# Patient Record
Sex: Female | Born: 1935 | Race: White | Hispanic: No | Marital: Married | Smoking: Never smoker
Health system: Southern US, Community
[De-identification: ages and names within clinical notes are randomized; demographics above are authoritative.]

## PROBLEM LIST (undated history)

## (undated) DIAGNOSIS — M810 Age-related osteoporosis without current pathological fracture: Secondary | ICD-10-CM

## (undated) DIAGNOSIS — F333 Major depressive disorder, recurrent, severe with psychotic symptoms: Secondary | ICD-10-CM

## (undated) DIAGNOSIS — D369 Benign neoplasm, unspecified site: Secondary | ICD-10-CM

## (undated) DIAGNOSIS — H7291 Unspecified perforation of tympanic membrane, right ear: Secondary | ICD-10-CM

## (undated) DIAGNOSIS — M419 Scoliosis, unspecified: Secondary | ICD-10-CM

## (undated) DIAGNOSIS — Z87442 Personal history of urinary calculi: Secondary | ICD-10-CM

## (undated) DIAGNOSIS — K5792 Diverticulitis of intestine, part unspecified, without perforation or abscess without bleeding: Secondary | ICD-10-CM

## (undated) DIAGNOSIS — R339 Retention of urine, unspecified: Secondary | ICD-10-CM

## (undated) DIAGNOSIS — G4700 Insomnia, unspecified: Secondary | ICD-10-CM

## (undated) DIAGNOSIS — J309 Allergic rhinitis, unspecified: Secondary | ICD-10-CM

## (undated) DIAGNOSIS — E559 Vitamin D deficiency, unspecified: Secondary | ICD-10-CM

## (undated) DIAGNOSIS — E039 Hypothyroidism, unspecified: Secondary | ICD-10-CM

## (undated) DIAGNOSIS — G8929 Other chronic pain: Secondary | ICD-10-CM

## (undated) DIAGNOSIS — M549 Dorsalgia, unspecified: Secondary | ICD-10-CM

## (undated) DIAGNOSIS — K219 Gastro-esophageal reflux disease without esophagitis: Secondary | ICD-10-CM

## (undated) DIAGNOSIS — N390 Urinary tract infection, site not specified: Secondary | ICD-10-CM

## (undated) DIAGNOSIS — F419 Anxiety disorder, unspecified: Secondary | ICD-10-CM

## (undated) DIAGNOSIS — N952 Postmenopausal atrophic vaginitis: Secondary | ICD-10-CM

## (undated) DIAGNOSIS — K449 Diaphragmatic hernia without obstruction or gangrene: Secondary | ICD-10-CM

## (undated) HISTORY — DX: Scoliosis, unspecified: M41.9

## (undated) HISTORY — DX: Age-related osteoporosis without current pathological fracture: M81.0

## (undated) HISTORY — DX: Gastro-esophageal reflux disease without esophagitis: K21.9

## (undated) HISTORY — PX: ROTATOR CUFF REPAIR: SHX139

## (undated) HISTORY — PX: ESOPHAGOGASTRODUODENOSCOPY: SHX1529

## (undated) HISTORY — DX: Vitamin D deficiency, unspecified: E55.9

## (undated) HISTORY — DX: Insomnia, unspecified: G47.00

## (undated) HISTORY — DX: Benign neoplasm, unspecified site: D36.9

## (undated) HISTORY — DX: Anxiety disorder, unspecified: F41.9

## (undated) HISTORY — DX: Major depressive disorder, recurrent, severe with psychotic symptoms: F33.3

## (undated) HISTORY — DX: Hypothyroidism, unspecified: E03.9

## (undated) HISTORY — PX: CHOLECYSTECTOMY: SHX55

## (undated) HISTORY — DX: Diaphragmatic hernia without obstruction or gangrene: K44.9

## (undated) HISTORY — DX: Unspecified perforation of tympanic membrane, right ear: H72.91

## (undated) HISTORY — DX: Dorsalgia, unspecified: M54.9

## (undated) HISTORY — DX: Personal history of urinary calculi: Z87.442

## (undated) HISTORY — DX: Postmenopausal atrophic vaginitis: N95.2

## (undated) HISTORY — DX: Allergic rhinitis, unspecified: J30.9

## (undated) HISTORY — DX: Retention of urine, unspecified: R33.9

## (undated) HISTORY — DX: Urinary tract infection, site not specified: N39.0

## (undated) HISTORY — PX: TONSILLECTOMY: SHX5217

## (undated) HISTORY — DX: Other chronic pain: G89.29

## (undated) HISTORY — DX: Diverticulitis of intestine, part unspecified, without perforation or abscess without bleeding: K57.92

---
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
## 1963-02-19 HISTORY — PX: OOPHORECTOMY: SHX86

## 1977-02-18 HISTORY — PX: ABDOMINAL HYSTERECTOMY: SHX81

## 1999-11-08 ENCOUNTER — Emergency Department (HOSPITAL_COMMUNITY): Admission: EM | Admit: 1999-11-08 | Discharge: 1999-11-08 | Payer: Self-pay | Admitting: *Deleted

## 2001-02-09 ENCOUNTER — Encounter: Payer: Self-pay | Admitting: Emergency Medicine

## 2001-02-09 ENCOUNTER — Emergency Department (HOSPITAL_COMMUNITY): Admission: EM | Admit: 2001-02-09 | Discharge: 2001-02-09 | Payer: Self-pay | Admitting: Emergency Medicine

## 2002-02-18 ENCOUNTER — Emergency Department (HOSPITAL_COMMUNITY): Admission: EM | Admit: 2002-02-18 | Discharge: 2002-02-18 | Payer: Self-pay

## 2005-02-18 DIAGNOSIS — M810 Age-related osteoporosis without current pathological fracture: Secondary | ICD-10-CM

## 2005-02-18 HISTORY — DX: Age-related osteoporosis without current pathological fracture: M81.0

## 2005-02-18 LAB — HM COLONOSCOPY

## 2005-04-12 ENCOUNTER — Ambulatory Visit: Payer: Self-pay | Admitting: Unknown Physician Specialty

## 2005-06-18 ENCOUNTER — Emergency Department: Payer: Self-pay | Admitting: Emergency Medicine

## 2005-06-24 ENCOUNTER — Other Ambulatory Visit: Payer: Self-pay

## 2005-06-24 ENCOUNTER — Emergency Department: Payer: Self-pay | Admitting: General Practice

## 2005-07-03 ENCOUNTER — Ambulatory Visit: Payer: Self-pay | Admitting: Internal Medicine

## 2005-07-16 ENCOUNTER — Ambulatory Visit: Payer: Self-pay | Admitting: Internal Medicine

## 2005-08-14 ENCOUNTER — Encounter: Admission: RE | Admit: 2005-08-14 | Discharge: 2005-08-14 | Payer: Self-pay | Admitting: Internal Medicine

## 2005-08-19 ENCOUNTER — Ambulatory Visit: Payer: Self-pay | Admitting: Internal Medicine

## 2005-09-18 LAB — HM DEXA SCAN

## 2005-09-23 ENCOUNTER — Ambulatory Visit: Payer: Self-pay | Admitting: Family Medicine

## 2005-10-08 ENCOUNTER — Encounter: Admission: RE | Admit: 2005-10-08 | Discharge: 2005-10-08 | Payer: Self-pay | Admitting: Family Medicine

## 2005-10-23 ENCOUNTER — Ambulatory Visit: Payer: Self-pay | Admitting: Family Medicine

## 2006-04-08 ENCOUNTER — Ambulatory Visit: Payer: Self-pay | Admitting: Internal Medicine

## 2006-04-08 LAB — CONVERTED CEMR LAB
ALT: 17 units/L (ref 0–40)
AST: 20 units/L (ref 0–37)
Albumin: 3.4 g/dL — ABNORMAL LOW (ref 3.5–5.2)
Alkaline Phosphatase: 58 units/L (ref 39–117)
BUN: 8 mg/dL (ref 6–23)
Basophils Absolute: 0.1 10*3/uL (ref 0.0–0.1)
Basophils Relative: 0.8 % (ref 0.0–1.0)
Bilirubin, Direct: 0.1 mg/dL (ref 0.0–0.3)
CO2: 29 meq/L (ref 19–32)
Calcium: 9.5 mg/dL (ref 8.4–10.5)
Chloride: 103 meq/L (ref 96–112)
Creatinine, Ser: 1 mg/dL (ref 0.4–1.2)
Eosinophils Absolute: 0.1 10*3/uL (ref 0.0–0.6)
Eosinophils Relative: 1.9 % (ref 0.0–5.0)
GFR calc Af Amer: 70 mL/min
GFR calc non Af Amer: 58 mL/min
Glucose, Bld: 129 mg/dL — ABNORMAL HIGH (ref 70–99)
HCT: 40.7 % (ref 36.0–46.0)
Hemoglobin: 13.6 g/dL (ref 12.0–15.0)
Lymphocytes Relative: 29.9 % (ref 12.0–46.0)
MCHC: 33.5 g/dL (ref 30.0–36.0)
MCV: 88.1 fL (ref 78.0–100.0)
Monocytes Absolute: 0.5 10*3/uL (ref 0.2–0.7)
Monocytes Relative: 6.9 % (ref 3.0–11.0)
Neutro Abs: 4.3 10*3/uL (ref 1.4–7.7)
Neutrophils Relative %: 60.5 % (ref 43.0–77.0)
Platelets: 265 10*3/uL (ref 150–400)
Potassium: 4 meq/L (ref 3.5–5.1)
RBC: 4.62 M/uL (ref 3.87–5.11)
RDW: 13.1 % (ref 11.5–14.6)
Sodium: 138 meq/L (ref 135–145)
Total Bilirubin: 0.7 mg/dL (ref 0.3–1.2)
Total Protein: 6.8 g/dL (ref 6.0–8.3)
WBC: 7.2 10*3/uL (ref 4.5–10.5)

## 2006-04-11 ENCOUNTER — Ambulatory Visit: Payer: Self-pay | Admitting: Podiatry

## 2006-08-29 DIAGNOSIS — F333 Major depressive disorder, recurrent, severe with psychotic symptoms: Secondary | ICD-10-CM

## 2006-10-16 ENCOUNTER — Telehealth (INDEPENDENT_AMBULATORY_CARE_PROVIDER_SITE_OTHER): Payer: Self-pay | Admitting: *Deleted

## 2006-10-29 ENCOUNTER — Ambulatory Visit: Payer: Self-pay | Admitting: Psychiatry

## 2006-10-30 ENCOUNTER — Encounter (INDEPENDENT_AMBULATORY_CARE_PROVIDER_SITE_OTHER): Payer: Self-pay | Admitting: *Deleted

## 2006-10-30 ENCOUNTER — Telehealth (INDEPENDENT_AMBULATORY_CARE_PROVIDER_SITE_OTHER): Payer: Self-pay | Admitting: *Deleted

## 2006-11-03 ENCOUNTER — Encounter (INDEPENDENT_AMBULATORY_CARE_PROVIDER_SITE_OTHER): Payer: Self-pay | Admitting: *Deleted

## 2006-11-04 ENCOUNTER — Ambulatory Visit: Payer: Self-pay | Admitting: Internal Medicine

## 2006-11-04 DIAGNOSIS — K219 Gastro-esophageal reflux disease without esophagitis: Secondary | ICD-10-CM

## 2006-11-04 DIAGNOSIS — E039 Hypothyroidism, unspecified: Secondary | ICD-10-CM

## 2006-11-04 DIAGNOSIS — M810 Age-related osteoporosis without current pathological fracture: Secondary | ICD-10-CM | POA: Insufficient documentation

## 2006-11-05 ENCOUNTER — Ambulatory Visit: Payer: Self-pay | Admitting: Psychiatry

## 2006-12-02 ENCOUNTER — Ambulatory Visit: Payer: Self-pay | Admitting: *Deleted

## 2006-12-03 ENCOUNTER — Ambulatory Visit: Payer: Self-pay | Admitting: Psychiatry

## 2006-12-20 DIAGNOSIS — J309 Allergic rhinitis, unspecified: Secondary | ICD-10-CM

## 2006-12-20 HISTORY — DX: Allergic rhinitis, unspecified: J30.9

## 2006-12-23 DIAGNOSIS — M542 Cervicalgia: Secondary | ICD-10-CM | POA: Insufficient documentation

## 2006-12-30 ENCOUNTER — Ambulatory Visit: Payer: Self-pay | Admitting: Internal Medicine

## 2007-02-02 ENCOUNTER — Telehealth (INDEPENDENT_AMBULATORY_CARE_PROVIDER_SITE_OTHER): Payer: Self-pay | Admitting: *Deleted

## 2007-02-10 ENCOUNTER — Ambulatory Visit: Payer: Self-pay | Admitting: Internal Medicine

## 2007-03-26 ENCOUNTER — Telehealth (INDEPENDENT_AMBULATORY_CARE_PROVIDER_SITE_OTHER): Payer: Self-pay | Admitting: *Deleted

## 2007-03-27 ENCOUNTER — Telehealth (INDEPENDENT_AMBULATORY_CARE_PROVIDER_SITE_OTHER): Payer: Self-pay | Admitting: *Deleted

## 2007-04-14 ENCOUNTER — Ambulatory Visit: Payer: Self-pay | Admitting: Internal Medicine

## 2007-04-14 DIAGNOSIS — G8929 Other chronic pain: Secondary | ICD-10-CM | POA: Insufficient documentation

## 2007-04-14 DIAGNOSIS — M545 Low back pain: Secondary | ICD-10-CM

## 2007-04-14 DIAGNOSIS — M62838 Other muscle spasm: Secondary | ICD-10-CM | POA: Insufficient documentation

## 2007-04-14 HISTORY — DX: Other chronic pain: G89.29

## 2007-05-13 ENCOUNTER — Telehealth (INDEPENDENT_AMBULATORY_CARE_PROVIDER_SITE_OTHER): Payer: Self-pay | Admitting: *Deleted

## 2007-05-20 ENCOUNTER — Ambulatory Visit: Payer: Self-pay | Admitting: Internal Medicine

## 2007-07-10 ENCOUNTER — Telehealth (INDEPENDENT_AMBULATORY_CARE_PROVIDER_SITE_OTHER): Payer: Self-pay | Admitting: *Deleted

## 2007-07-14 ENCOUNTER — Ambulatory Visit: Payer: Self-pay | Admitting: Internal Medicine

## 2010-02-18 HISTORY — PX: KNEE SURGERY: SHX244

## 2010-09-19 ENCOUNTER — Encounter: Payer: Self-pay | Admitting: Internal Medicine

## 2011-04-02 ENCOUNTER — Encounter: Payer: Self-pay | Admitting: Internal Medicine

## 2011-04-03 ENCOUNTER — Ambulatory Visit: Payer: Self-pay | Admitting: Internal Medicine

## 2011-05-07 ENCOUNTER — Emergency Department: Payer: Self-pay | Admitting: Emergency Medicine

## 2011-05-07 DIAGNOSIS — M533 Sacrococcygeal disorders, not elsewhere classified: Secondary | ICD-10-CM | POA: Diagnosis not present

## 2011-05-07 DIAGNOSIS — M810 Age-related osteoporosis without current pathological fracture: Secondary | ICD-10-CM | POA: Diagnosis not present

## 2011-05-07 DIAGNOSIS — IMO0001 Reserved for inherently not codable concepts without codable children: Secondary | ICD-10-CM | POA: Diagnosis not present

## 2011-05-07 DIAGNOSIS — M25559 Pain in unspecified hip: Secondary | ICD-10-CM | POA: Diagnosis not present

## 2011-05-07 DIAGNOSIS — Z79899 Other long term (current) drug therapy: Secondary | ICD-10-CM | POA: Diagnosis not present

## 2011-05-07 DIAGNOSIS — E039 Hypothyroidism, unspecified: Secondary | ICD-10-CM | POA: Diagnosis not present

## 2011-05-07 DIAGNOSIS — IMO0002 Reserved for concepts with insufficient information to code with codable children: Secondary | ICD-10-CM | POA: Diagnosis not present

## 2011-05-12 ENCOUNTER — Emergency Department: Payer: Self-pay | Admitting: Internal Medicine

## 2011-05-12 DIAGNOSIS — M549 Dorsalgia, unspecified: Secondary | ICD-10-CM | POA: Diagnosis not present

## 2011-05-15 DIAGNOSIS — M545 Low back pain: Secondary | ICD-10-CM | POA: Diagnosis not present

## 2011-05-15 DIAGNOSIS — R209 Unspecified disturbances of skin sensation: Secondary | ICD-10-CM | POA: Diagnosis not present

## 2011-05-15 DIAGNOSIS — M546 Pain in thoracic spine: Secondary | ICD-10-CM | POA: Diagnosis not present

## 2011-05-31 DIAGNOSIS — M545 Low back pain: Secondary | ICD-10-CM | POA: Diagnosis not present

## 2011-06-03 DIAGNOSIS — M545 Low back pain: Secondary | ICD-10-CM | POA: Diagnosis not present

## 2011-06-18 DIAGNOSIS — M546 Pain in thoracic spine: Secondary | ICD-10-CM | POA: Diagnosis not present

## 2011-06-18 DIAGNOSIS — M25569 Pain in unspecified knee: Secondary | ICD-10-CM | POA: Diagnosis not present

## 2011-06-18 DIAGNOSIS — M545 Low back pain: Secondary | ICD-10-CM | POA: Diagnosis not present

## 2011-07-16 DIAGNOSIS — M25569 Pain in unspecified knee: Secondary | ICD-10-CM | POA: Diagnosis not present

## 2011-07-16 DIAGNOSIS — M545 Low back pain: Secondary | ICD-10-CM | POA: Diagnosis not present

## 2011-08-29 DIAGNOSIS — R5381 Other malaise: Secondary | ICD-10-CM | POA: Diagnosis not present

## 2011-08-29 DIAGNOSIS — M129 Arthropathy, unspecified: Secondary | ICD-10-CM | POA: Diagnosis not present

## 2011-08-29 DIAGNOSIS — E785 Hyperlipidemia, unspecified: Secondary | ICD-10-CM | POA: Diagnosis not present

## 2011-08-29 DIAGNOSIS — R5383 Other fatigue: Secondary | ICD-10-CM | POA: Diagnosis not present

## 2011-08-29 DIAGNOSIS — H7290 Unspecified perforation of tympanic membrane, unspecified ear: Secondary | ICD-10-CM | POA: Diagnosis not present

## 2011-08-29 DIAGNOSIS — J3089 Other allergic rhinitis: Secondary | ICD-10-CM | POA: Diagnosis not present

## 2011-10-30 DIAGNOSIS — L819 Disorder of pigmentation, unspecified: Secondary | ICD-10-CM | POA: Diagnosis not present

## 2011-10-30 DIAGNOSIS — L820 Inflamed seborrheic keratosis: Secondary | ICD-10-CM | POA: Diagnosis not present

## 2011-10-30 DIAGNOSIS — L821 Other seborrheic keratosis: Secondary | ICD-10-CM | POA: Diagnosis not present

## 2011-10-30 DIAGNOSIS — D1800 Hemangioma unspecified site: Secondary | ICD-10-CM | POA: Diagnosis not present

## 2011-11-01 DIAGNOSIS — M542 Cervicalgia: Secondary | ICD-10-CM | POA: Diagnosis not present

## 2011-11-19 ENCOUNTER — Encounter: Payer: Self-pay | Admitting: Internal Medicine

## 2011-12-19 DIAGNOSIS — E7800 Pure hypercholesterolemia, unspecified: Secondary | ICD-10-CM | POA: Diagnosis not present

## 2011-12-19 DIAGNOSIS — E039 Hypothyroidism, unspecified: Secondary | ICD-10-CM | POA: Diagnosis not present

## 2011-12-19 DIAGNOSIS — M810 Age-related osteoporosis without current pathological fracture: Secondary | ICD-10-CM | POA: Diagnosis not present

## 2011-12-23 DIAGNOSIS — N951 Menopausal and female climacteric states: Secondary | ICD-10-CM | POA: Diagnosis not present

## 2011-12-23 DIAGNOSIS — E559 Vitamin D deficiency, unspecified: Secondary | ICD-10-CM | POA: Diagnosis not present

## 2011-12-23 DIAGNOSIS — R030 Elevated blood-pressure reading, without diagnosis of hypertension: Secondary | ICD-10-CM | POA: Diagnosis not present

## 2011-12-23 DIAGNOSIS — E7800 Pure hypercholesterolemia, unspecified: Secondary | ICD-10-CM | POA: Diagnosis not present

## 2011-12-23 DIAGNOSIS — E039 Hypothyroidism, unspecified: Secondary | ICD-10-CM | POA: Diagnosis not present

## 2011-12-26 DIAGNOSIS — J3089 Other allergic rhinitis: Secondary | ICD-10-CM | POA: Diagnosis not present

## 2011-12-26 DIAGNOSIS — R002 Palpitations: Secondary | ICD-10-CM | POA: Diagnosis not present

## 2011-12-26 DIAGNOSIS — M129 Arthropathy, unspecified: Secondary | ICD-10-CM | POA: Diagnosis not present

## 2011-12-26 DIAGNOSIS — H7290 Unspecified perforation of tympanic membrane, unspecified ear: Secondary | ICD-10-CM | POA: Diagnosis not present

## 2011-12-26 DIAGNOSIS — H6120 Impacted cerumen, unspecified ear: Secondary | ICD-10-CM | POA: Diagnosis not present

## 2011-12-26 DIAGNOSIS — G4700 Insomnia, unspecified: Secondary | ICD-10-CM | POA: Diagnosis not present

## 2012-01-06 DIAGNOSIS — J209 Acute bronchitis, unspecified: Secondary | ICD-10-CM | POA: Diagnosis not present

## 2012-01-06 DIAGNOSIS — J019 Acute sinusitis, unspecified: Secondary | ICD-10-CM | POA: Diagnosis not present

## 2012-01-13 DIAGNOSIS — J019 Acute sinusitis, unspecified: Secondary | ICD-10-CM | POA: Diagnosis not present

## 2012-01-13 DIAGNOSIS — F411 Generalized anxiety disorder: Secondary | ICD-10-CM | POA: Diagnosis not present

## 2012-01-15 DIAGNOSIS — K5900 Constipation, unspecified: Secondary | ICD-10-CM | POA: Diagnosis not present

## 2012-03-02 ENCOUNTER — Encounter: Payer: Self-pay | Admitting: Internal Medicine

## 2012-03-14 ENCOUNTER — Emergency Department (HOSPITAL_COMMUNITY)
Admission: EM | Admit: 2012-03-14 | Discharge: 2012-03-15 | Disposition: A | Discharge status: Home / Self Care | Payer: Medicare Other | Attending: Emergency Medicine | Admitting: Emergency Medicine

## 2012-03-14 ENCOUNTER — Encounter (HOSPITAL_COMMUNITY): Payer: Self-pay | Admitting: Emergency Medicine

## 2012-03-14 ENCOUNTER — Emergency Department (HOSPITAL_COMMUNITY): Payer: Medicare Other

## 2012-03-14 DIAGNOSIS — M810 Age-related osteoporosis without current pathological fracture: Secondary | ICD-10-CM | POA: Diagnosis not present

## 2012-03-14 DIAGNOSIS — F411 Generalized anxiety disorder: Secondary | ICD-10-CM | POA: Insufficient documentation

## 2012-03-14 DIAGNOSIS — Z8719 Personal history of other diseases of the digestive system: Secondary | ICD-10-CM | POA: Diagnosis not present

## 2012-03-14 DIAGNOSIS — E079 Disorder of thyroid, unspecified: Secondary | ICD-10-CM | POA: Diagnosis not present

## 2012-03-14 DIAGNOSIS — K219 Gastro-esophageal reflux disease without esophagitis: Secondary | ICD-10-CM | POA: Diagnosis not present

## 2012-03-14 DIAGNOSIS — N390 Urinary tract infection, site not specified: Secondary | ICD-10-CM

## 2012-03-14 DIAGNOSIS — F29 Unspecified psychosis not due to a substance or known physiological condition: Secondary | ICD-10-CM | POA: Diagnosis not present

## 2012-03-14 DIAGNOSIS — F22 Delusional disorders: Secondary | ICD-10-CM | POA: Diagnosis not present

## 2012-03-14 DIAGNOSIS — Z0389 Encounter for observation for other suspected diseases and conditions ruled out: Secondary | ICD-10-CM | POA: Diagnosis not present

## 2012-03-14 DIAGNOSIS — R918 Other nonspecific abnormal finding of lung field: Secondary | ICD-10-CM | POA: Diagnosis not present

## 2012-03-14 LAB — COMPREHENSIVE METABOLIC PANEL
BUN: 11 mg/dL (ref 6–23)
CO2: 25 mEq/L (ref 19–32)
Calcium: 9.7 mg/dL (ref 8.4–10.5)
Chloride: 98 mEq/L (ref 96–112)
Creatinine, Ser: 1.13 mg/dL — ABNORMAL HIGH (ref 0.50–1.10)
GFR calc Af Amer: 53 mL/min — ABNORMAL LOW (ref >90)
GFR calc non Af Amer: 46 mL/min — ABNORMAL LOW (ref >90)
Glucose, Bld: 103 mg/dL — ABNORMAL HIGH (ref 70–99)
Total Bilirubin: 0.3 mg/dL (ref 0.3–1.2)

## 2012-03-14 LAB — CBC
HCT: 46.5 % — ABNORMAL HIGH (ref 36.0–46.0)
MCH: 30.7 pg (ref 26.0–34.0)
MCV: 91 fL (ref 78.0–100.0)
RBC: 5.11 MIL/uL (ref 3.87–5.11)
WBC: 12.7 10*3/uL — ABNORMAL HIGH (ref 4.0–10.5)

## 2012-03-14 LAB — URINALYSIS, ROUTINE W REFLEX MICROSCOPIC
Glucose, UA: NEGATIVE mg/dL
Protein, ur: NEGATIVE mg/dL
Specific Gravity, Urine: 1.012 (ref 1.005–1.030)
pH: 5.5 (ref 5.0–8.0)

## 2012-03-14 LAB — URINE MICROSCOPIC-ADD ON

## 2012-03-14 LAB — SALICYLATE LEVEL: Salicylate Lvl: <2 mg/dL — ABNORMAL LOW (ref 2.8–20.0)

## 2012-03-14 LAB — RAPID URINE DRUG SCREEN, HOSP PERFORMED
Amphetamines: NOT DETECTED
Opiates: NOT DETECTED

## 2012-03-14 LAB — ETHANOL: Alcohol, Ethyl (B): <11 mg/dL (ref 0–11)

## 2012-03-14 LAB — TSH: TSH: 0.674 u[IU]/mL (ref 0.350–4.500)

## 2012-03-14 MED ORDER — ALUM & MAG HYDROXIDE-SIMETH 200-200-20 MG/5ML PO SUSP: 30.0000 mL | ORAL | Status: DC | PRN

## 2012-03-14 MED ORDER — ACETAMINOPHEN 325 MG PO TABS
650.0000 mg | ORAL_TABLET | ORAL | Status: DC | PRN
  Administered 2012-03-14 (×2): 650 mg via ORAL
  Filled 2012-03-14 (×2): qty 2

## 2012-03-14 MED ORDER — LORAZEPAM 2 MG/ML IJ SOLN: 0.5000 mg | Freq: Four times a day (QID) | INTRAMUSCULAR | Status: DC | PRN

## 2012-03-14 MED ORDER — LEVOTHYROXINE SODIUM 88 MCG PO TABS
88.0000 ug | ORAL_TABLET | Freq: Every day | ORAL | Status: DC
  Administered 2012-03-14 – 2012-03-15 (×2): 88 ug via ORAL
  Filled 2012-03-14 (×5): qty 1

## 2012-03-14 MED ORDER — LEVOFLOXACIN 500 MG PO TABS: 500.0000 mg | ORAL_TABLET | Freq: Every day | ORAL | Status: DC

## 2012-03-14 MED ORDER — LORAZEPAM 0.5 MG PO TABS
0.5000 mg | ORAL_TABLET | Freq: Four times a day (QID) | ORAL | Status: DC | PRN
  Administered 2012-03-14 – 2012-03-15 (×3): 0.5 mg via ORAL
  Filled 2012-03-14 (×3): qty 1

## 2012-03-14 MED ORDER — CLONAZEPAM 0.5 MG PO TABS
1.0000 mg | ORAL_TABLET | Freq: Every evening | ORAL | Status: DC | PRN
  Administered 2012-03-14: 1 mg via ORAL

## 2012-03-14 MED ORDER — RISPERIDONE 0.5 MG PO TABS
0.5000 mg | ORAL_TABLET | Freq: Every day | ORAL | Status: DC
  Administered 2012-03-14: 0.5 mg via ORAL
  Filled 2012-03-14: qty 1

## 2012-03-14 MED ORDER — VITAMIN B-12 100 MCG PO TABS
100.0000 ug | ORAL_TABLET | Freq: Every day | ORAL | Status: DC
  Administered 2012-03-14 – 2012-03-15 (×2): 100 ug via ORAL
  Filled 2012-03-14 (×2): qty 1

## 2012-03-14 MED ORDER — CLONAZEPAM 0.5 MG PO TABS
1.0000 mg | ORAL_TABLET | Freq: Every day | ORAL | Status: DC
  Filled 2012-03-14 (×2): qty 1

## 2012-03-14 MED ORDER — CIPROFLOXACIN HCL 500 MG PO TABS
500.0000 mg | ORAL_TABLET | Freq: Two times a day (BID) | ORAL | Status: DC
  Administered 2012-03-14 – 2012-03-15 (×3): 500 mg via ORAL
  Filled 2012-03-14 (×3): qty 1

## 2012-03-14 MED ORDER — ONDANSETRON HCL 4 MG PO TABS: 4.0000 mg | ORAL_TABLET | Freq: Three times a day (TID) | ORAL | Status: DC | PRN

## 2012-03-14 NOTE — ED Notes (Signed)
Pt arrived via [employer], pt is IVC, states she has been up for days, pt states she became upset at neighbors for talking to her through wall and spraying stink spray into her bedroom. Pt lives with husband and grandson. Pt states she has lost weight d/t conflict with neighbors. Pt states she the neighbors told her they were going to kill her husband and hang her, pt states she feels like she is being watched and can hear her moves being narrated. Pt states she has handgun in home but it is unloaded and states she has had thoughts of hurting them if they comes in her home. Denies SI.

## 2012-03-14 NOTE — ED Notes (Signed)
Please notify husband Tashira Torre at [phone] when patient is discharged.

## 2012-03-14 NOTE — ED Notes (Signed)
Patient very emotional and crying  states she does not want to go to a facility, she just wants to go home. Patient states she is anxious and her stomach feels funny.  Husband at bedside .Notified Dr. Hyacinth Meeker for new orders.

## 2012-03-14 NOTE — BH Assessment (Signed)
Assessment Note   Jamie Pitts is an 79 y.o. female.  Patient was brought in to [facility] on IVC.  Clinician assisted physician with 1st opinion.  Patient reports that she has been hearing voices in the townhouse since November.  She says that she went for a month without turning on the light in the bathroom because she thought voices could see her through the mirror.  She said voices would say vile things about her body.  She says that she believes that voices have set up camera surveillance because when she leaves the room she hears the voices (a man and a woman) say "where did she go and can you see her now?"  Patient perseverates on voices talking to her about identity theft then shortly after this her granddaughter had her identity stolen.  Voices have told her that they would come to the home and kill her then hang her so that it would look like a murder-suicide.  She went to the length of calling her life insurance agent to let him know that if she died it would not be because of murder-suicide.  Patient is very convinced of these things actually going on.  She has lost sleep and has lost 30 lbs because of this.  Patient last night heard voices saying that they were going to come to the home and steal it from her.  Patient then got the gun and attempted to load it in case someone came through the door.  She downplays this by saying that all she was going to do was sleep with the gun under her pillow so that she can feel protected.  Patient has had no SI or HI.  She does not have a significant psychiatric history other than four sessions of counseling by therapist at the [facility] about 6 years ago.  Patient became upset about having to go to a psychiatric hospital and was crying and pleading not to have to go.  Patient does not realize she is already on IVC.  Referral to be sent to [facility] and [facility]. Axis I: Psychotic Disorder NOS Axis II: Deferred Axis III:  Past  Medical History  Diagnosis Date  . GERD (gastroesophageal reflux disease)   . Thyroid disease   . Osteoporosis   . Gastric pain   . Hiatal hernia   . Adenomatous polyp    Axis IV: other psychosocial or environmental problems Axis V: 21-30 behavior considerably influenced by delusions or hallucinations OR serious impairment in judgment, communication OR inability to function in almost all areas  Past Medical History:  Past Medical History  Diagnosis Date  . GERD (gastroesophageal reflux disease)   . Thyroid disease   . Osteoporosis   . Gastric pain   . Hiatal hernia   . Adenomatous polyp     Past Surgical History  Procedure Date  . Appendectomy   . Cholecystectomy   . Abdominal hysterectomy   . Tonsillectomy   . Oophorectomy     Ectopic pregnancy, removal of right ovary and tube- 1960  . Esophagogastroduodenoscopy   . Ectopic pregnancy surgery   . Rotator cuff repair   . Knee surgery     Family History: No family history on file.  Social History:  reports that she has never smoked. She has never used smokeless tobacco. She reports that she does not drink alcohol or use illicit drugs.  Additional Social History:  Alcohol / Drug Use Pain Medications: None Prescriptions: N/A  Over the Counter: N/A History of alcohol / drug use?: No history of alcohol / drug abuse  CIWA: CIWA-Ar BP: 124/72 mmHg Pulse Rate: 81  COWS:    Allergies:  Allergies  Allergen Reactions  . Penicillins Hives  . Sulfonamide Derivatives Hives    Home Medications:  (Not in a hospital admission)  OB/GYN Status:  No LMP recorded. Patient has had a hysterectomy.  General Assessment Data Location of Assessment: [facility] ED ACT Assessment: Yes Living Arrangements: Spouse/significant other Can pt return to current living arrangement?: Yes Admission Status: Involuntary Is patient capable of signing voluntary admission?: No Transfer from: Acute Hospital Referral Source: Self/Family/Friend      Risk to self Suicidal Ideation: No Suicidal Intent: No Is patient at risk for suicide?: No Suicidal Plan?: No Access to Means: No What has been your use of drugs/alcohol within the last 12 months?: none Previous Attempts/Gestures: No How many times?: 0  Other Self Harm Risks: None Triggers for Past Attempts: None known Intentional Self Injurious Behavior: None Family Suicide History: No Recent stressful life event(s):  (Nothing significant identified) Persecutory voices/beliefs?: Yes Depression: Yes Depression Symptoms: Isolating;Loss of interest in usual pleasures Substance abuse history and/or treatment for substance abuse?: No Suicide prevention information given to non-admitted patients: Not applicable  Risk to Others Homicidal Ideation: No Thoughts of Harm to Others: No Current Homicidal Intent: No Current Homicidal Plan: No Access to Homicidal Means: No Identified Victim: No one History of harm to others?: No Assessment of Violence: None Noted Violent Behavior Description: None reported Does patient have access to weapons?: Yes (Comment) (There is a gun in the home.) Criminal Charges Pending?: No Describe Pending Criminal Charges: None Does patient have a court date: No  Psychosis Hallucinations: Auditory (People are watching her and threatening to kill her & husban) Delusions: Persecutory  Mental Status Report Appear/Hygiene:  (Casual) Eye Contact: Good Motor Activity: Freedom of movement;Unremarkable Speech:  (Speech is normal but content of speech delusional) Level of Consciousness: Alert Mood: Anxious;Helpless Affect: Anxious Anxiety Level: Severe Thought Processes: Circumstantial (Perseverates on delusional thought process) Judgement: Impaired Orientation: Person;Place;Time Obsessive Compulsive Thoughts/Behaviors: Severe  Cognitive Functioning Concentration: Decreased Memory: Recent Intact;Remote Intact IQ: Average Insight: Poor Impulse  Control: Poor Appetite: Poor Weight Loss: 30  Weight Gain: 0  Sleep: Decreased Total Hours of Sleep:  (Varies but less than 6 hrs per day.) Vegetative Symptoms: None  ADLScreening [facility] Assessment Services) Patient's cognitive ability adequate to safely complete daily activities?: Yes Patient able to express need for assistance with ADLs?: Yes Independently performs ADLs?: Yes (appropriate for developmental age)  Abuse/Neglect [facility]) Physical Abuse: Denies Verbal Abuse: Denies Sexual Abuse: Denies  Prior Inpatient Therapy Prior Inpatient Therapy: No Prior Therapy Dates: None Prior Therapy Facilty/Provider(s): None Reason for Treatment: None  Prior Outpatient Therapy Prior Outpatient Therapy: Yes Prior Therapy Dates: 6 years ago Prior Therapy Facilty/Provider(s): Therapist at [facility] Reason for Treatment: Depression related to husband's cancer  ADL Screening (condition at time of admission) Patient's cognitive ability adequate to safely complete daily activities?: Yes Patient able to express need for assistance with ADLs?: Yes Independently performs ADLs?: Yes (appropriate for developmental age) Weakness of Legs: None Weakness of Arms/Hands: None  Home Assistive Devices/Equipment Home Assistive Devices/Equipment: None    Abuse/Neglect Assessment (Assessment to be complete while patient is alone) Physical Abuse: Denies Verbal Abuse: Denies Sexual Abuse: Denies Exploitation of patient/patient's resources: Denies Self-Neglect: Denies Values / Beliefs Cultural Requests During Hospitalization: None Spiritual Requests During Hospitalization: None  Advance Directives (For Healthcare) Advance Directive: Patient does not have advance directive;Patient would not like information    Additional Information 1:1 In Past 12 Months?: No CIRT Risk: No Elopement Risk: No Does patient have medical clearance?: Yes     Disposition:  Disposition Disposition of  Patient: Inpatient treatment program;Referred to Type of inpatient treatment program: Adult Patient referred to:  (Referred to [facility] and [place])  On Site Evaluation by:   Reviewed with Physician:     Beatriz Stallion Ray 03/14/2012 3:50 PM

## 2012-03-14 NOTE — BHH Counselor (Signed)
TC from April at [facility] - pt has been accepted. Arrange transportation and let [place] know when she will be arriving. Writer notified pt's RN. As pt is under IVC, sheriff's dept must transport and only transport at 7 am and 7 pm daily. Writer relayed this info to [employer]. Per RN, RN called and left vmail for [employer] for pickup tomorrow am. TC from Clinical research associate to [place] admitting RN notifying RN that pt would be arriving by mid am 03/15/12.

## 2012-03-14 NOTE — ED Provider Notes (Signed)
Medical screening examination/treatment/procedure(s) were performed by non-physician practitioner and as supervising physician I was immediately available for consultation/collaboration.  John-Adam Milam Allbaugh, M.D.     John-Adam Isabell Bonafede, MD 03/14/12 0833 

## 2012-03-14 NOTE — ED Provider Notes (Signed)
History     CSN: [id number]  Arrival date & time 03/14/12  0241   First MD Initiated Contact with Patient 03/14/12 [phone]      Chief Complaint  Patient presents with  . Medical Clearance    (Consider location/radiation/quality/duration/timing/severity/associated sxs/prior treatment) HPI History provided by patient and her husband.  Pt lives w/ her grandson and husband.  She has a h/o hypothyroidism and takes klonopin prn for anxiety.  She reports that for the last 3 months, her neighbors have been threatening to kill her and her husband and take their home. She can hear them talking through the wall of her bedroom at night and she has been unable to sleep.  She also hears them narrating her movements and believes they have set up cameras in her home to spy on her.  No one believes her but she has a good mind and she is not making this up.  No recent illnesses.  Her husband reports that she has been experiencing auditory hallucinations for three months.  They are cordial with their neighbors but have never really talked to them.  Last night, patient loaded a rifle and stated that she was going to kill the neighbors if they tried to come into her home.  She has no prior h/o psychosis.  Past Medical History  Diagnosis Date  . GERD (gastroesophageal reflux disease)   . Thyroid disease   . Osteoporosis   . Gastric pain   . Hiatal hernia   . Adenomatous polyp     Past Surgical History  Procedure Date  . Appendectomy   . Cholecystectomy   . Abdominal hysterectomy   . Tonsillectomy   . Oophorectomy     Ectopic pregnancy, removal of right ovary and tube- 1960  . Esophagogastroduodenoscopy   . Ectopic pregnancy surgery   . Rotator cuff repair   . Knee surgery     No family history on file.  History  Substance Use Topics  . Smoking status: Never Smoker   . Smokeless tobacco: Never Used  . Alcohol Use: No    OB History    Grav Para Term Preterm Abortions TAB SAB Ect Mult Living                    Review of Systems  All other systems reviewed and are negative.    Allergies  Penicillins and Sulfonamide derivatives  Home Medications   Current Outpatient Rx  Name  Route  Sig  Dispense  Refill  . CALCIUM 600 + MINERALS 600-200 MG-UNIT PO TABS   Oral   Take 2 tablets by mouth every morning.          Marland Kitchen CLONAZEPAM 1 MG PO TABS   Oral   Take 1 mg by mouth at bedtime as needed. For anxiety or sleep         . VITAMIN B-12 PO   Oral   Take 1 tablet by mouth every morning.         Marland Kitchen DEXTROMETHORPHAN HBR 15 MG/5ML PO SYRP   Oral   Take 10 mLs by mouth 2 (two) times daily as needed. For cough         . DICYCLOMINE HCL 10 MG PO CAPS   Oral   Take 10 mg by mouth daily as needed. For diverticulitis         . DOCUSATE SODIUM 100 MG PO CAPS   Oral   Take 100 mg by mouth every morning.         Marland Kitchen  IBUPROFEN-DIPHENHYDRAMINE HCL 200-25 MG PO CAPS   Oral   Take 2 capsules by mouth at bedtime as needed. For pain or insomnia         . LEVOTHYROXINE SODIUM 88 MCG PO TABS   Oral   Take 88 mcg by mouth every morning.          . SENNA-DOCUSATE SODIUM 8.6-50 MG PO TABS   Oral   Take 1 tablet by mouth every morning.           BP 161/95  Pulse 114  Temp 98.6 F (37 C) (Oral)  Resp 20  SpO2 99%  Physical Exam  Nursing note and vitals reviewed. Constitutional: She is oriented to person, place, and time. She appears well-developed and well-nourished. No distress.  HENT:  Head: Normocephalic and atraumatic.  Eyes:       Normal appearance  Neck: Normal range of motion.  Cardiovascular: Normal rate and regular rhythm.   Pulmonary/Chest: Effort normal and breath sounds normal. No respiratory distress.  Musculoskeletal: Normal range of motion.  Neurological: She is alert and oriented to person, place, and time.       Pt is very articulate.  She became mildly agitated when she got the sense that I didn't believe her.   Skin: Skin is warm and  dry. No rash noted.  Psychiatric: She has a normal mood and affect. Her behavior is normal.    ED Course  Procedures (including critical care time)  Labs Reviewed  CBC - Abnormal; Notable for the following:    WBC 12.7 (*)     Hemoglobin 15.7 (*)     HCT 46.5 (*)     All other components within normal limits  COMPREHENSIVE METABOLIC PANEL - Abnormal; Notable for the following:    Glucose, Bld 103 (*)     Creatinine, Ser 1.13 (*)     GFR calc non Af Amer 46 (*)     GFR calc Af Amer 53 (*)     All other components within normal limits  SALICYLATE LEVEL - Abnormal; Notable for the following:    Salicylate Lvl <2.0 (*)     All other components within normal limits  URINALYSIS, ROUTINE W REFLEX MICROSCOPIC - Abnormal; Notable for the following:    APPearance CLOUDY (*)     Hgb urine dipstick SMALL (*)     Leukocytes, UA LARGE (*)     All other components within normal limits  URINE MICROSCOPIC-ADD ON - Abnormal; Notable for the following:    Squamous Epithelial / LPF FEW (*)     Bacteria, UA FEW (*)     All other components within normal limits  ACETAMINOPHEN LEVEL  ETHANOL  URINE RAPID DRUG SCREEN (HOSP PERFORMED)  URINE CULTURE   No results found.   1. Psychosis   2. UTI (lower urinary tract infection)       MDM  [phone] F presents w/ auditory hallucinations and HI. No prior history.   Holding orders written, including abx for UTI.  Do not suspect psychoses are related to UTI because onset 3 months ago.  Will obtain telepsych consult.         Otilio Miu, PA-C 03/14/12 [phone]

## 2012-03-14 NOTE — ED Provider Notes (Signed)
Jamie Pitts is a 79 y.o. female with what appears to be a first psychotic break at 79 years old, patient is altered and with a mildly elevated white count a chest x-ray was obtained to investigate for possible pneumonia we'll the patient is afebrile, does not have a productive cough, with no clinical clinical symptomatology, I do not think this chest x-ray represents a pneumonia, will not treat. Patient is medically clear for psychiatric treatment.   Jones Skene, MD 03/14/12 [phone]

## 2012-03-14 NOTE — ED Provider Notes (Signed)
As requested by ACT;  I signed the first opinion involuntary commitment for this patient, to assist for placement.  The patient's thyroid function testing is pending- 17:30    Date: 12/06/2011  Rate: 84  Rhythm: normal sinus rhythm  QRS Axis: normal  PR and QT Intervals: normal  ST/T Wave abnormalities: normal  PR and QRS Conduction Disutrbances:Possible right heart conduction delay versus RVH  Narrative Interpretation:   Old EKG Reviewed: none available    Flint Melter, MD 03/15/12 [phone]

## 2012-03-14 NOTE — ED Provider Notes (Addendum)
  Physical Exam  BP 133/82  Pulse 81  Temp 98.4 F (36.9 C) (Oral)  Resp 16  SpO2 94%  Physical Exam  ED Course  Procedures  MDM I have discussed the care of this patient with a [facility] psychiatrist this morning. She has had increasing delusions at home prompting her visit to the emergency department. Because she has no history of psychiatric medical problems she was recommended to have a CT scan of the head in addition to her other organic workup. Thus far her laboratory workup has not shown any significant abnormalities.  Her chest x-ray showed bibasilar opacities but she does not have any shortness of breath or cough. Her white blood cell count is 12,700, she is not anemic and she has white blood cells in her urine but only a few bacteria. We'll treat with antibiotics to cover  urinary infection, CT scan of the head is pending, reevaluate after that time.  Ciprofloxacin given this morning for urinary infection, urine culture has been ordered.      Vida Roller, MD 03/14/12 4098  Vida Roller, MD 03/14/12 [phone]

## 2012-03-14 NOTE — ED Notes (Signed)
Tele-psych consult completed.   

## 2012-03-14 NOTE — ED Notes (Signed)
Pt resting v/s stable. Pt  Having a little anxiety about being in the hospital 0.5 mg of po ativan given will continue to monitor.

## 2012-03-15 DIAGNOSIS — M810 Age-related osteoporosis without current pathological fracture: Secondary | ICD-10-CM | POA: Diagnosis present

## 2012-03-15 DIAGNOSIS — Z882 Allergy status to sulfonamides status: Secondary | ICD-10-CM | POA: Diagnosis not present

## 2012-03-15 DIAGNOSIS — Z880 Allergy status to penicillin: Secondary | ICD-10-CM | POA: Diagnosis not present

## 2012-03-15 DIAGNOSIS — F4320 Adjustment disorder, unspecified: Secondary | ICD-10-CM | POA: Diagnosis present

## 2012-03-15 DIAGNOSIS — F209 Schizophrenia, unspecified: Secondary | ICD-10-CM | POA: Diagnosis present

## 2012-03-15 DIAGNOSIS — N390 Urinary tract infection, site not specified: Secondary | ICD-10-CM | POA: Diagnosis present

## 2012-03-15 DIAGNOSIS — F200 Paranoid schizophrenia: Secondary | ICD-10-CM | POA: Diagnosis not present

## 2012-03-15 DIAGNOSIS — K219 Gastro-esophageal reflux disease without esophagitis: Secondary | ICD-10-CM | POA: Diagnosis present

## 2012-03-15 DIAGNOSIS — E039 Hypothyroidism, unspecified: Secondary | ICD-10-CM | POA: Diagnosis present

## 2012-03-15 DIAGNOSIS — F29 Unspecified psychosis not due to a substance or known physiological condition: Secondary | ICD-10-CM | POA: Diagnosis present

## 2012-03-15 LAB — URINE CULTURE

## 2012-03-15 MED ORDER — DOCUSATE SODIUM 100 MG PO CAPS
100.0000 mg | ORAL_CAPSULE | Freq: Two times a day (BID) | ORAL | Status: DC
  Administered 2012-03-15: 100 mg via ORAL
  Filled 2012-03-15: qty 1

## 2012-03-15 NOTE — Progress Notes (Signed)
Spoke with nurse and charge nurse and updated them on pt going to [facility] for tx.  Nurse to follow up with [place] and sheriff for transport

## 2012-03-15 NOTE — ED Provider Notes (Signed)
Filed Vitals:   03/15/12 0523  BP: 102/65  Pulse: 92  Temp: 97.8 F (36.6 C)  Resp: 20   Pt seen this am. NAD. Is IVC'd. Reports hearing less voices but still displaying paranoid thoughts to me. Pending placement.   Raeford Razor, MD 03/15/12 [phone]

## 2012-03-24 ENCOUNTER — Ambulatory Visit: Payer: Self-pay | Admitting: Internal Medicine

## 2012-03-24 ENCOUNTER — Encounter (HOSPITAL_COMMUNITY): Payer: Self-pay | Admitting: *Deleted

## 2012-03-24 ENCOUNTER — Emergency Department (HOSPITAL_COMMUNITY)
Admission: EM | Admit: 2012-03-24 | Discharge: 2012-03-24 | Disposition: A | Discharge status: Home / Self Care | Payer: Medicare Other | Attending: Emergency Medicine | Admitting: Emergency Medicine

## 2012-03-24 DIAGNOSIS — Z8739 Personal history of other diseases of the musculoskeletal system and connective tissue: Secondary | ICD-10-CM | POA: Insufficient documentation

## 2012-03-24 DIAGNOSIS — Z90710 Acquired absence of both cervix and uterus: Secondary | ICD-10-CM | POA: Diagnosis not present

## 2012-03-24 DIAGNOSIS — E079 Disorder of thyroid, unspecified: Secondary | ICD-10-CM | POA: Insufficient documentation

## 2012-03-24 DIAGNOSIS — K5900 Constipation, unspecified: Secondary | ICD-10-CM | POA: Insufficient documentation

## 2012-03-24 DIAGNOSIS — Z8719 Personal history of other diseases of the digestive system: Secondary | ICD-10-CM | POA: Diagnosis not present

## 2012-03-24 DIAGNOSIS — Z79899 Other long term (current) drug therapy: Secondary | ICD-10-CM | POA: Diagnosis not present

## 2012-03-24 DIAGNOSIS — Z860100 Personal history of colon polyps, unspecified: Secondary | ICD-10-CM | POA: Insufficient documentation

## 2012-03-24 DIAGNOSIS — Z9089 Acquired absence of other organs: Secondary | ICD-10-CM | POA: Diagnosis not present

## 2012-03-24 DIAGNOSIS — Z9079 Acquired absence of other genital organ(s): Secondary | ICD-10-CM | POA: Diagnosis not present

## 2012-03-24 MED ORDER — FLEET ENEMA 7-19 GM/118ML RE ENEM: 1.0000 | ENEMA | Freq: Once | RECTAL | Status: DC

## 2012-03-24 MED ORDER — FLEET ENEMA 7-19 GM/118ML RE ENEM
1.0000 | ENEMA | Freq: Once | RECTAL | Status: AC
  Administered 2012-03-24: 1 via RECTAL
  Filled 2012-03-24: qty 1

## 2012-03-24 NOTE — ED Notes (Signed)
Pt states she has a hard time "getting urine out." Pt states it "dribbles out." Pt states last BM was 9 days ago--taken colace and mag citrate. Pt denies pain currently. Pt alert and mentating appropriately. Pt states that normally enemas work for her to relieve impact.

## 2012-03-24 NOTE — ED Provider Notes (Signed)
History     CSN: [id number]  Arrival date & time 03/24/12  1008   First MD Initiated Contact with Patient 03/24/12 1036      Chief Complaint  Patient presents with  . Rectal Pain    HPI  Presents with concern of constipation, rectal pressure.  Last bowel movement was 9 days ago.  She states that although she has a history the patient, that amount of time is in unusual amount for her.  She denies significant abdominal pain, any chest pain, dyspnea, lightheadedness, syncope other focal complaints. She continues to take Senokot, Colace, and took magnesium citrate yesterday, with no stool production. She has required enemas in the past. The patient was recently hospitalized at behavioral health, and he denies any current depression, suicidal ideation, homicidal ideation, hallucinations.  She states that when she is out of her typical environment, she has episodes of constipation  Past Medical History  Diagnosis Date  . GERD (gastroesophageal reflux disease)   . Thyroid disease   . Osteoporosis   . Gastric pain   . Hiatal hernia   . Adenomatous polyp     Past Surgical History  Procedure Date  . Appendectomy   . Cholecystectomy   . Abdominal hysterectomy   . Tonsillectomy   . Oophorectomy     Ectopic pregnancy, removal of right ovary and tube- 1960  . Esophagogastroduodenoscopy   . Ectopic pregnancy surgery   . Rotator cuff repair   . Knee surgery     No family history on file.  History  Substance Use Topics  . Smoking status: Never Smoker   . Smokeless tobacco: Never Used  . Alcohol Use: No    OB History    Grav Para Term Preterm Abortions TAB SAB Ect Mult Living                  Review of Systems  Constitutional:       Per HPI, otherwise negative  HENT:       Per HPI, otherwise negative  Eyes: Negative.   Respiratory:       Per HPI, otherwise negative  Cardiovascular:       Per HPI, otherwise negative  Gastrointestinal: Positive for constipation and  rectal pain. Negative for vomiting and abdominal pain.  Genitourinary: Negative.   Musculoskeletal:       Per HPI, otherwise negative  Skin: Negative.   Neurological: Negative for syncope.    Allergies  Penicillins and Sulfonamide derivatives  Home Medications   Current Outpatient Rx  Name  Route  Sig  Dispense  Refill  . CLONAZEPAM 1 MG PO TABS   Oral   Take 1 mg by mouth 2 (two) times daily.          Marland Kitchen VITAMIN B-12 PO   Oral   Take 1 tablet by mouth every morning.         Marland Kitchen DICYCLOMINE HCL 10 MG PO CAPS   Oral   Take 10 mg by mouth daily as needed. For diverticulitis         . DOCUSATE SODIUM 100 MG PO CAPS   Oral   Take 100 mg by mouth every morning.         Marland Kitchen LAMOTRIGINE 25 MG PO TABS   Oral   Take 50 mg by mouth 2 (two) times daily.         Marland Kitchen LEVOTHYROXINE SODIUM 88 MCG PO TABS   Oral   Take 88 mcg by mouth  every morning.          Marland Kitchen RISPERIDONE 1 MG PO TABS   Oral   Take 1.5 mg by mouth at bedtime.         . SENNA-DOCUSATE SODIUM 8.6-50 MG PO TABS   Oral   Take 1 tablet by mouth every morning.         . TRAZODONE HCL 50 MG PO TABS   Oral   Take 50 mg by mouth at bedtime.         . VENLAFAXINE HCL ER 37.5 MG PO CP24   Oral   Take 37.5 mg by mouth 2 (two) times daily.           BP 123/59  Pulse 96  Temp 97.6 F (36.4 C) (Oral)  Resp 18  Ht 5\' 4"  (1.626 m)  Wt 162 lb (73.483 kg)  BMI 27.81 kg/m2  SpO2 93%  Physical Exam  Nursing note and vitals reviewed. Constitutional: She is oriented to person, place, and time. She appears well-developed and well-nourished. No distress.  HENT:  Head: Normocephalic and atraumatic.  Eyes: Conjunctivae normal and EOM are normal.  Cardiovascular: Normal rate and regular rhythm.   Pulmonary/Chest: Effort normal and breath sounds normal. No stridor. No respiratory distress.  Abdominal: She exhibits no distension. There is no hepatosplenomegaly. There is tenderness. There is no rigidity, no  rebound, no guarding and no CVA tenderness.  Genitourinary:       There is stool palpated in the rectal vault, soft, non-impacted  Musculoskeletal: She exhibits no edema.  Neurological: She is alert and oriented to person, place, and time. No cranial nerve deficit.  Skin: Skin is warm and dry.  Psychiatric: She has a normal mood and affect.    ED Course  Procedures (including critical care time)  Labs Reviewed - No data to display No results found.   No diagnosis found.  12:28 PM Patient passed some stool.  MDM  This elderly female with history of constipation presents with ongoing constipation.  On exam she is hemodynamic stable, in no distress.  The patient received an enema here, had passage of stool.  Absent new complaints, with stable vital signs, and with no other focal abnormalities patient was discharged in stable condition with prescription for home enema, instructions to continue taking stool softeners.     Gerhard Munch, MD 03/24/12 1229

## 2012-03-24 NOTE — ED Notes (Signed)
Pt educated on home care management of constipation. Pt ambulatory leaving ED. Pt requested something to put in her pants upon d/c. Pt given blue brief to leave ED with. Pt does not appear to show signs of acute distress upon d/c. Pt alert and mentating appropriately. Pt verbalizes understanding of d/c teaching. Pt has no further questions upon d/c.

## 2012-03-24 NOTE — ED Notes (Signed)
Patient reports she is having pressure and pain in her rectum.  She has not had a bm for 9 days.  Patient unable to sit or lie down due to pain.  Patient had similar event in December.  Patient denies n/v.  Patient states she is eating and drinking

## 2012-03-31 DIAGNOSIS — E559 Vitamin D deficiency, unspecified: Secondary | ICD-10-CM | POA: Diagnosis not present

## 2012-03-31 DIAGNOSIS — E039 Hypothyroidism, unspecified: Secondary | ICD-10-CM | POA: Diagnosis not present

## 2012-04-02 ENCOUNTER — Ambulatory Visit (HOSPITAL_COMMUNITY): Payer: Medicare Other | Admitting: Psychiatry

## 2012-04-10 DIAGNOSIS — G4700 Insomnia, unspecified: Secondary | ICD-10-CM | POA: Diagnosis not present

## 2012-04-21 ENCOUNTER — Ambulatory Visit: Payer: Self-pay | Admitting: Internal Medicine

## 2012-04-29 ENCOUNTER — Ambulatory Visit (HOSPITAL_COMMUNITY): Payer: Medicare Other | Admitting: Psychiatry

## 2012-05-22 ENCOUNTER — Ambulatory Visit (INDEPENDENT_AMBULATORY_CARE_PROVIDER_SITE_OTHER): Payer: Medicare Other | Admitting: Psychiatry

## 2012-05-22 ENCOUNTER — Encounter (HOSPITAL_COMMUNITY): Payer: Self-pay | Admitting: Psychiatry

## 2012-05-22 VITALS — Wt 162.0 lb

## 2012-05-22 DIAGNOSIS — F329 Major depressive disorder, single episode, unspecified: Secondary | ICD-10-CM

## 2012-05-22 DIAGNOSIS — F418 Other specified anxiety disorders: Secondary | ICD-10-CM

## 2012-05-22 DIAGNOSIS — F5105 Insomnia due to other mental disorder: Secondary | ICD-10-CM | POA: Insufficient documentation

## 2012-05-22 DIAGNOSIS — E559 Vitamin D deficiency, unspecified: Secondary | ICD-10-CM

## 2012-05-22 DIAGNOSIS — F341 Dysthymic disorder: Secondary | ICD-10-CM

## 2012-05-22 MED ORDER — RISPERIDONE 0.25 MG PO TABS: 0.1250 mg | ORAL_TABLET | Freq: Three times a day (TID) | ORAL | Status: DC | PRN

## 2012-05-22 MED ORDER — LAMOTRIGINE 25 MG PO TABS: 50.0000 mg | ORAL_TABLET | Freq: Two times a day (BID) | ORAL | Status: DC

## 2012-05-22 MED ORDER — RISPERIDONE 1 MG PO TABS: 2.0000 mg | ORAL_TABLET | Freq: Every day | ORAL | Status: DC

## 2012-05-22 MED ORDER — VENLAFAXINE HCL ER 37.5 MG PO CP24: 37.5000 mg | ORAL_CAPSULE | Freq: Two times a day (BID) | ORAL | Status: DC

## 2012-05-22 NOTE — Patient Instructions (Signed)
Set a timer for 8 minutes and walk for that amount of time in the house or in the yard.  Mark "8" on a calendar for that day.  Do that every day this week.  Then next week increase the time to 9 minutes and then mark the calendar with a 9 for that day.  Each week increase your exercise by one minute.  Keep a record of this so you can see what progress you are making.  Do this every day, just like eating and sleeping.  It is good for pain control, depression, and for your soul/spirit.  Bring the record in for your next visit so we can talk about your effort and how you feel with the new exercise program going and working for you. BEHOLD BEAUTY on these walks.  Take care of yourself.  No one else is standing up to do the job and only you know what you need.   GET SERIOUS about taking care of yourself.  Do the next right thing and that often means doing something to care for yourself along the lines of are you hungry, are you angry, are you lonely, are you tired, are you scared?  HALTS is what that stands for.  Call if problems or concerns.

## 2012-05-22 NOTE — Progress Notes (Signed)
Psychiatric Assessment Adult [phone]  Patient Identification:  Jamie Pitts Date of Evaluation:  05/22/2012 Start Time: 11:30 AM End Time: 12:40 PM  Chief Complaint: "I got out of the hospital in February.  The voices and songs have stopped with the medications". Chief Complaint  Patient presents with  . Depression  . Establish Care  . Follow-up  . Medication Refill   History of Chief Complaint:   At age 79 pt went to a "fat farm" to fatten her up.  She remembers her father paid mor attention to her than her mother did.  She experienced some anxiety [place].  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  She noted feeling very home sick to go back to [place] her home place in 2010.  Her sister had been very nervous and she was beginning to feel anxious and was started on Klonopin.  ISince then she noted songs for 3 or 4 years.  Then in October of 2013, she started hearing voices non stop and she lost 30 pounds. This prompted her to get a gun and put it under the bed and the family got concerned.  She was admitted to [facility] on January 24th.  She was stopped from the Klonopin and Lithium (pt doesn't understand how she got on that) and placed on Ripserdal, Trazodone, Lamictal, and Effexor with pretty good results, except she has very dry mouth at night.  Will stop the Trazodone and shift her Risperdal around.  She has noted blurred vision since starting the meds.  She is getting new glasses for herself.  HPI Review of Systems  HENT: Negative.   Gastrointestinal: Negative.   Genitourinary: Negative.   Neurological: Positive for weakness and light-headedness. Negative for dizziness, tremors, seizures, syncope, facial asymmetry, speech difficulty, numbness and headaches.       Lightheaded if gets up too fast.  Psychiatric/Behavioral: Positive for  dysphoric mood and decreased concentration. Negative for suicidal ideas, hallucinations, behavioral problems, confusion, sleep disturbance, self-injury and agitation. The patient is nervous/anxious. The patient is not hyperactive.    Physical Exam  Depressive Symptoms: none now  (Hypo) Manic Symptoms:   None  Anxiety Symptoms: Excessive Worry:  Yes Panic Symptoms:  No Agoraphobia:  No Obsessive Compulsive: Yes  Symptoms: ordliness Specific Phobias:  Yes Social Anxiety:  No  Psychotic Symptoms:  None  PTSD Symptoms: Ever had a traumatic exposure:  Yes Had a traumatic exposure in the last month:  No No other features  Traumatic Brain Injury: No   Past Psychiatric History: Diagnosis: Depression  Hospitalizations: once [facility]  Outpatient Care: PCP  Substance Abuse Care: none  Self-Mutilation: none  Suicidal Attempts: none  Violent Behaviors: one episode of getting a gun, but now sees that that is not the way to handle her voices   Past Medical History:   Past Medical History  Diagnosis Date  . GERD (gastroesophageal reflux disease)   . Thyroid disease   . Osteoporosis   . Gastric pain   . Hiatal hernia   . Adenomatous polyp    History of Loss of Consciousness:  No Seizure History:  No Cardiac History:  No Allergies:   Allergies  Allergen Reactions  . Penicillins Hives  . Sulfonamide Derivatives Hives   Current Medications:  Current Outpatient Prescriptions  Medication Sig Dispense Refill  .  docusate sodium (COLACE) 100 MG capsule Take 100 mg by mouth every morning.      . lamoTRIgine (LAMICTAL) 25 MG tablet Take 2 tablets (50 mg total) by mouth 2 (two) times daily.  120 tablet  1  . levothyroxine (SYNTHROID, LEVOTHROID) 88 MCG tablet Take 88 mcg by mouth every morning.       . risperiDONE (RISPERDAL) 1 MG tablet Take 2 tablets (2 mg total) by mouth at bedtime. For voices and sedation  60 tablet  1  . venlafaxine XR (EFFEXOR-XR) 37.5 MG 24 hr capsule Take 1  capsule (37.5 mg total) by mouth 2 (two) times daily.  60 capsule  1  . Cyanocobalamin (VITAMIN B-12 PO) Take 1 tablet by mouth every morning.      . dicyclomine (BENTYL) 10 MG capsule Take 10 mg by mouth daily as needed. For diverticulitis      . risperiDONE (RISPERDAL) 0.25 MG tablet Take 0.5-1 tablets (0.125-0.25 mg total) by mouth 3 (three) times daily as needed (anxiety). Take about 3:30 each evening and as needed  90 tablet  2  . sennosides-docusate sodium (SENOKOT-S) 8.6-50 MG tablet Take 1 tablet by mouth every morning.      . sodium phosphate (FLEET) 7-19 GM/118ML ENEM Place 1 enema rectally once.  1 Bottle  0   No current facility-administered medications for this visit.    Previous Psychotropic Medications:  Medication Dose   Klonopin     Lithium ?    Risperdal    Lamictal    Effexor    Trazodone       Substance Abuse History in the last 12 months: Substance Age of 1st Use Last Use Amount Specific Type  Nicotine  none        Alcohol  20  March 13      Cannabis  none        Opiates  none        Cocaine  none        Methamphetamines  none        LSD  none        Ecstasy  none         Benzodiazepines  [age]      Caffeine  childhood  this AM      Inhalants  none        Others:       sugar  childhood  this AM                  Medical Consequences of Substance Abuse: none  Legal Consequences of Substance Abuse: none  Family Consequences of Substance Abuse: none  Social History: Current Place of Residence: [id number] [place] [address] Place of Birth: [place], [place] Family Members: husband Marital Status:  Married Children: 1  Sons: 0  Daughters: 1 Relationships: husbandf Education:  Corporate treasurer Problems/Performance: good Religious Beliefs/Practices: [employer] History of Abuse: none Occupational Experiences: clerical, office, Scientist, forensic History:  Electronics engineer History: none Hobbies/Interests: walking, reading, playing  cards  Family History:   Family History  Problem Relation Age of Onset  . Schizophrenia Sister   . ADD / ADHD Neg Hx   . Alcohol abuse Neg Hx   . Bipolar disorder Neg Hx   . Dementia Neg Hx   . Drug abuse Neg Hx   . OCD Neg Hx   . Paranoid behavior Neg Hx   . Seizures Neg Hx   . Sexual abuse Neg  Hx   . Physical abuse Neg Hx   . Anxiety disorder Grandchild   . Depression Grandchild   . Cancer - Lung Sister   . Healthy Sister     Mental Status Examination/Evaluation: Objective:  Appearance: Casual  Eye Contact::  Good  Speech:  Clear and Coherent  Volume:  Normal  Mood:  neutral  Affect:  Congruent  Thought Process:  Coherent, Intact and Linear  Orientation:  Full (Time, Place, and Person)  Thought Content:  WDL  Suicidal Thoughts:  No  Homicidal Thoughts:  No  Judgement:  Good  Insight:  Good  Psychomotor Activity:  Normal  Akathisia:  No  Handed:  Right  AIMS (if indicated):    Assets:  Communication Skills Desire for Improvement    Laboratory/X-Ray Psychological Evaluation(s)   none  none   Assessment:    AXIS I Major Depression, single episode  AXIS II Deferred  AXIS III Past Medical History  Diagnosis Date  . GERD (gastroesophageal reflux disease)   . Thyroid disease   . Osteoporosis   . Gastric pain   . Hiatal hernia   . Adenomatous polyp      AXIS IV economic problems and other psychosocial or environmental problems  AXIS V 61-70 mild symptoms   Treatment Plan/Recommendations:  Laboratory:  Vitamin D level get results  Psychotherapy: supportive  Medications: Risperdal, Lamictal, Effexor  Routine PRN Medications:  No  Consultations: none  Safety Concerns:  none  Other:     Plan/Discussion: I took her vitals.  I reviewed CC, tobacco/med/surg Hx, meds effects/ side effects, problem list, therapies and responses as well as current situation/symptoms discussed options. See orders and pt instructions for more details.  MEDICATIONS this  encounter: Meds ordered this encounter  Medications  . venlafaxine XR (EFFEXOR-XR) 37.5 MG 24 hr capsule    Sig: Take 1 capsule (37.5 mg total) by mouth 2 (two) times daily.    Dispense:  60 capsule    Refill:  1  . risperiDONE (RISPERDAL) 1 MG tablet    Sig: Take 2 tablets (2 mg total) by mouth at bedtime. For voices and sedation    Dispense:  60 tablet    Refill:  1  . lamoTRIgine (LAMICTAL) 25 MG tablet    Sig: Take 2 tablets (50 mg total) by mouth 2 (two) times daily.    Dispense:  120 tablet    Refill:  1  . risperiDONE (RISPERDAL) 0.25 MG tablet    Sig: Take 0.5-1 tablets (0.125-0.25 mg total) by mouth 3 (three) times daily as needed (anxiety). Take about 3:30 each evening and as needed    Dispense:  90 tablet    Refill:  2    Medical Decision Making Problem Points:  New problem, with no additional work-up planned (3) and Review of psycho-social stressors (1) Data Points:  Review or order clinical lab tests (1) Review of medication regiment & side effects (2) Review of new medications or change in dosage (2)  I certify that outpatient services furnished can reasonably be expected to improve the patient's condition.   Orson Aloe, MD, [facility]

## 2012-06-01 DIAGNOSIS — M545 Low back pain: Secondary | ICD-10-CM | POA: Diagnosis not present

## 2012-06-19 ENCOUNTER — Ambulatory Visit (INDEPENDENT_AMBULATORY_CARE_PROVIDER_SITE_OTHER): Payer: Medicare Other | Admitting: Psychiatry

## 2012-06-19 ENCOUNTER — Encounter (HOSPITAL_COMMUNITY): Payer: Self-pay | Admitting: Psychiatry

## 2012-06-19 VITALS — BP 130/69 | HR 86 | Ht 63.75 in | Wt 166.0 lb

## 2012-06-19 DIAGNOSIS — F341 Dysthymic disorder: Secondary | ICD-10-CM

## 2012-06-19 DIAGNOSIS — M549 Dorsalgia, unspecified: Secondary | ICD-10-CM

## 2012-06-19 DIAGNOSIS — F329 Major depressive disorder, single episode, unspecified: Secondary | ICD-10-CM

## 2012-06-19 MED ORDER — VENLAFAXINE HCL ER 37.5 MG PO CP24: 37.5000 mg | ORAL_CAPSULE | Freq: Two times a day (BID) | ORAL | Status: DC

## 2012-06-19 MED ORDER — RISPERIDONE 0.25 MG PO TABS: 0.1250 mg | ORAL_TABLET | Freq: Three times a day (TID) | ORAL | Status: DC | PRN

## 2012-06-19 MED ORDER — LAMOTRIGINE 25 MG PO TABS: 50.0000 mg | ORAL_TABLET | Freq: Two times a day (BID) | ORAL | Status: DC

## 2012-06-19 NOTE — Patient Instructions (Signed)
stevia hard candy or truvia hard candy may be helpful for the dry mouth.  Cutting the day time Risperdal to 1/2 may help too.   CUT BACK/CUT OUT on sugar and carbohydrates, that means very limited fruits and starchy vegetables and very limited grains, breads  The goal is low GLYCEMIC INDEX.  CUT OUT all wheat, rye, or barley for the GLUTEN in them.  HIGH fat and LOW carbohydrate diet is the KEY.  Eat avocados, eggs, lean meat like grass fed beef and chicken  Nuts and seeds would be good foods as well.   Stevia is an excellent sweetener.  Safe for the brain.   Jamie Pitts is also a good safe sweetener, not the baking blend form of Truvia  Almond butter is awesome.  Check out all this on the Internet.  Jamie Pitts  is on the Internet with some good info about this.   http://www.drperlmutter.com is where that is.  An excellent site for info on this diet is http://paleoleap.com  Jamie Pitts makes dark Pitts that is sweetened with Stevia that is safe.

## 2012-06-19 NOTE — Progress Notes (Signed)
[facility] [id number] Progress Note Jamie Pitts MRN: [id number] DOB: 1933-12-15 Age: 79 y.o.  Date: 06/19/2012 Start Time: 3:15 PM End Time: 3:37 PM  Chief Complaint: Chief Complaint  Patient presents with  . Depression  . Follow-up  . Medication Refill   Subjective: "I did the walking and then my back flared up and went back to the orthopaedic doctor". Depression 3/10 and Anxiety 3/10, where 0 is none and 10 is the worst. Pain is 4/10 with her back.  The patient returns for follow-up appointment.  Pt reports that she is compliant with the psychotropic medications with fair benefit and some side effects.  Dry mouth is a side effect.  Her energy seems a little low according to her husband.  She has cut her pain meds to half and is better with her energy.   The anxiety during the day is better with the Risperdal during the day.  LIFE STYLE CHANGES: walking she has noted with walking her mood was better "It increases your outlook on life".  History of Chief Complaint:   At age 74 pt went to a "fat farm" to fatten her up.  She remembers her father paid mor attention to her than her mother did.  She experienced some anxiety [place].  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  She noted feeling very home sick to go back to [place] her home place in 2010.  Her sister had been very nervous and she was beginning to feel anxious and was started on Klonopin.  ISince then she noted songs for 3 or 4 years.  Then in October of 2013, she started hearing voices non stop and she lost 30 pounds. This prompted her to get a gun and put it under the bed and the family got concerned.  She was admitted to [facility] on January 24th.  She was stopped from the Klonopin and Lithium (pt doesn't understand how she got on that) and placed on Ripserdal, Trazodone,  Lamictal, and Effexor with pretty good results, except she has very dry mouth at night.  Will stop the Trazodone and shift her Risperdal around.  She has noted blurred vision since starting the meds.  She is getting new glasses for herself.  HPI Review of Systems  HENT: Negative.   Gastrointestinal: Negative.   Genitourinary: Negative.   Neurological: Positive for weakness and light-headedness. Negative for dizziness, tremors, seizures, syncope, facial asymmetry, speech difficulty, numbness and headaches.       Lightheaded if gets up too fast.  Psychiatric/Behavioral: Positive for dysphoric mood and decreased concentration. Negative for suicidal ideas, hallucinations, behavioral problems, confusion, sleep disturbance, self-injury and agitation. The patient is nervous/anxious. The patient is not hyperactive.    Physical Exam  Depressive Symptoms: none now  (Hypo) Manic Symptoms:   None  Anxiety Symptoms: Excessive Worry:  Yes Panic Symptoms:  No Agoraphobia:  No Obsessive Compulsive: Yes  Symptoms: ordliness Specific Phobias:  Yes Social Anxiety:  No  Psychotic Symptoms:  None  PTSD Symptoms: Ever had a traumatic exposure:  Yes Had a traumatic exposure in the last month:  No No other features  Traumatic Brain Injury: No   Past Psychiatric History: Diagnosis: Depression  Hospitalizations: once [facility]  Outpatient Care: PCP  Substance Abuse Care: none  Self-Mutilation: none  Suicidal Attempts: none  Violent Behaviors: one episode of getting a gun, but now sees that that is not the way to handle her voices    Allergies: Allergies  Allergen Reactions  . Penicillins Hives  . Sulfonamide Derivatives Hives   Past Medical History:   Past Medical History  Diagnosis Date  . GERD (gastroesophageal reflux disease)   . Thyroid disease   . Osteoporosis   . Gastric pain   . Hiatal hernia   . Adenomatous polyp   . Lumbago    Surgical History: Past Surgical History   Procedure Laterality Date  . Appendectomy    . Cholecystectomy    . Abdominal hysterectomy    . Tonsillectomy    . Oophorectomy      Ectopic pregnancy, removal of right ovary and tube- 1960  . Esophagogastroduodenoscopy    . Ectopic pregnancy surgery    . Rotator cuff repair    . Knee surgery     History of Loss of Consciousness:  No Seizure History:  No Cardiac History:  No Allergies:   Allergies  Allergen Reactions  . Penicillins Hives  . Sulfonamide Derivatives Hives   Current Medications:  Current Outpatient Prescriptions  Medication Sig Dispense Refill  . Cyanocobalamin (VITAMIN B-12 PO) Take 1 tablet by mouth every morning.      . dicyclomine (BENTYL) 10 MG capsule Take 10 mg by mouth daily as needed. For diverticulitis      . docusate sodium (COLACE) 100 MG capsule Take 100 mg by mouth every morning.      . lamoTRIgine (LAMICTAL) 25 MG tablet Take 2 tablets (50 mg total) by mouth 2 (two) times daily.  120 tablet  1  . levothyroxine (SYNTHROID, LEVOTHROID) 88 MCG tablet Take 88 mcg by mouth every morning.       . risperiDONE (RISPERDAL) 0.25 MG tablet Take 0.5-1 tablets (0.125-0.25 mg total) by mouth 3 (three) times daily as needed (anxiety). Take about 3:30 each evening and as needed  90 tablet  2  . risperiDONE (RISPERDAL) 1 MG tablet Take 2 tablets (2 mg total) by mouth at bedtime. For voices and sedation  60 tablet  1  . sennosides-docusate sodium (SENOKOT-S) 8.6-50 MG tablet Take 1 tablet by mouth every morning.      . sodium phosphate (FLEET) 7-19 GM/118ML ENEM Place 1 enema rectally once.  1 Bottle  0  . venlafaxine XR (EFFEXOR-XR) 37.5 MG 24 hr capsule Take 1 capsule (37.5 mg total) by mouth 2 (two) times daily.  60 capsule  1   No current facility-administered medications for this visit.    Previous Psychotropic Medications:  Medication Dose   Klonopin     Lithium ?    Risperdal    Lamictal    Effexor    Trazodone    Substance Abuse History in the last  12 months: Substance Age of 1st Use Last Use Amount Specific Type  Nicotine  none        Alcohol  20  March 13      Cannabis  none        Opiates  none        Cocaine  none        Methamphetamines  none        LSD  none        Ecstasy  none         Benzodiazepines  [age]      Caffeine  childhood  this AM  Inhalants  none        Others:       sugar  childhood  this AM     Medical Consequences of Substance Abuse: none  Legal Consequences of Substance Abuse: none  Family Consequences of Substance Abuse: none  Social History: Current Place of Residence: [phone] [place] [address] Place of Birth: [place], [place] Family Members: husband Marital Status:  Married Children: 1  Sons: 0  Daughters: 1 Relationships: husbandf Education:  Corporate treasurer Problems/Performance: good Religious Beliefs/Practices: [employer] History of Abuse: none Occupational Experiences: clerical, office, Scientist, forensic History:  Electronics engineer History: none Hobbies/Interests: walking, reading, playing cards  Family History:   Family History  Problem Relation Age of Onset  . Schizophrenia Sister   . ADD / ADHD Neg Hx   . Alcohol abuse Neg Hx   . Bipolar disorder Neg Hx   . Dementia Neg Hx   . Drug abuse Neg Hx   . OCD Neg Hx   . Paranoid behavior Neg Hx   . Seizures Neg Hx   . Sexual abuse Neg Hx   . Physical abuse Neg Hx   . Anxiety disorder Grandchild   . Depression Grandchild   . Cancer - Lung Sister   . Healthy Sister     Mental Status Examination/Evaluation: Objective:  Appearance: Casual  Eye Contact::  Good  Speech:  Clear and Coherent  Volume:  Normal  Mood:  neutral  Affect:  Congruent  Thought Process:  Coherent, Intact and Linear  Orientation:  Full (Time, Place, and Person)  Thought Content:  WDL  Suicidal Thoughts:  No  Homicidal Thoughts:  No  Judgement:  Good  Insight:  Good  Psychomotor Activity:  Normal  Akathisia:  No  Handed:   Right  AIMS (if indicated):    Assets:  Communication Skills Desire for Improvement   Lab Results:  Results for orders placed during the hospital encounter of 03/14/12 (from the past 8736 hour(s))  URINE RAPID DRUG SCREEN (HOSP PERFORMED)   Collection Time    03/14/12  3:11 AM      Result Value Range   Opiates NONE DETECTED  NONE DETECTED   Cocaine NONE DETECTED  NONE DETECTED   Benzodiazepines NONE DETECTED  NONE DETECTED   Amphetamines NONE DETECTED  NONE DETECTED   Tetrahydrocannabinol NONE DETECTED  NONE DETECTED   Barbiturates NONE DETECTED  NONE DETECTED  ACETAMINOPHEN LEVEL   Collection Time    03/14/12  3:14 AM      Result Value Range   Acetaminophen (Tylenol), Serum <15.0  10 - 30 ug/mL  CBC   Collection Time    03/14/12  3:14 AM      Result Value Range   WBC 12.7 (*) 4.0 - 10.5 K/uL   RBC 5.11  3.87 - 5.11 MIL/uL   Hemoglobin 15.7 (*) 12.0 - 15.0 g/dL   HCT 60.4 (*) 54.0 - 98.1 %   MCV 91.0  78.0 - 100.0 fL   MCH 30.7  26.0 - 34.0 pg   MCHC 33.8  30.0 - 36.0 g/dL   RDW 19.1  47.8 - 29.5 %   Platelets 295  150 - 400 K/uL  COMPREHENSIVE METABOLIC PANEL   Collection Time    03/14/12  3:14 AM      Result Value Range   Sodium 136  135 - 145 mEq/L   Potassium 3.5  3.5 - 5.1 mEq/L   Chloride 98  96 - 112  mEq/L   CO2 25  19 - 32 mEq/L   Glucose, Bld 103 (*) 70 - 99 mg/dL   BUN 11  6 - 23 mg/dL   Creatinine, Ser 7.82 (*) 0.50 - 1.10 mg/dL   Calcium 9.7  8.4 - 95.6 mg/dL   Total Protein 7.8  6.0 - 8.3 g/dL   Albumin 4.1  3.5 - 5.2 g/dL   AST 18  0 - 37 U/L   ALT 11  0 - 35 U/L   Alkaline Phosphatase 67  39 - 117 U/L   Total Bilirubin 0.3  0.3 - 1.2 mg/dL   GFR calc non Af Amer 46 (*) >90 mL/min   GFR calc Af Amer 53 (*) >90 mL/min  ETHANOL   Collection Time    03/14/12  3:14 AM      Result Value Range   Alcohol, Ethyl (B) <11  0 - 11 mg/dL  SALICYLATE LEVEL   Collection Time    03/14/12  3:14 AM      Result Value Range   Salicylate Lvl <2.0 (*) 2.8 -  20.0 mg/dL  URINE CULTURE   Collection Time    03/14/12  3:16 AM      Result Value Range   Specimen Description URINE, CLEAN CATCH     Special Requests NONE     Culture  Setup Time 03/14/2012 14:41     Colony Count 65,000 COLONIES/ML     Culture       Value: Multiple bacterial morphotypes present, none predominant. Suggest appropriate recollection if clinically indicated.   Report Status 03/15/2012 FINAL    URINALYSIS, ROUTINE W REFLEX MICROSCOPIC   Collection Time    03/14/12  3:16 AM      Result Value Range   Color, Urine YELLOW  YELLOW   APPearance CLOUDY (*) CLEAR   Specific Gravity, Urine 1.012  1.005 - 1.030   pH 5.5  5.0 - 8.0   Glucose, UA NEGATIVE  NEGATIVE mg/dL   Hgb urine dipstick SMALL (*) NEGATIVE   Bilirubin Urine NEGATIVE  NEGATIVE   Ketones, ur NEGATIVE  NEGATIVE mg/dL   Protein, ur NEGATIVE  NEGATIVE mg/dL   Urobilinogen, UA 0.2  0.0 - 1.0 mg/dL   Nitrite NEGATIVE  NEGATIVE   Leukocytes, UA LARGE (*) NEGATIVE  URINE MICROSCOPIC-ADD ON   Collection Time    03/14/12  3:16 AM      Result Value Range   Squamous Epithelial / LPF FEW (*) RARE   WBC, UA TOO NUMEROUS TO COUNT  <3 WBC/hpf   RBC / HPF 0-2  <3 RBC/hpf   Bacteria, UA FEW (*) RARE   Urine-Other LESS THAN 10 mL OF URINE SUBMITTED    TSH   Collection Time    03/14/12  1:12 PM      Result Value Range   TSH 0.674  0.350 - 4.500 uIU/mL  T4   Collection Time    03/14/12  1:12 PM      Result Value Range   T4, Total 13.4 (*) 5.0 - 12.5 ug/dL   Assessment:    AXIS I Major Depression, single episode  AXIS II Deferred  AXIS III Past Medical History  Diagnosis Date  . GERD (gastroesophageal reflux disease)   . Thyroid disease   . Osteoporosis   . Gastric pain   . Hiatal hernia   . Adenomatous polyp   . Lumbago      AXIS IV economic problems and other psychosocial or environmental  problems  AXIS V 61-70 mild symptoms   Treatment Plan/Recommendations:  Laboratory:  Vitamin D level get  results  Psychotherapy: supportive  Medications: Risperdal, Lamictal, Effexor  Routine PRN Medications:  No  Consultations: none  Safety Concerns:  none  Other:     Plan/Discussion: I took her vitals.  I reviewed CC, tobacco/med/surg Hx, meds effects/ side effects, problem list, therapies and responses as well as current situation/symptoms discussed options. Continue current effective medications. See orders and pt instructions for more details.  MEDICATIONS this encounter: Meds ordered this encounter  Medications  . risperiDONE (RISPERDAL) 0.25 MG tablet    Sig: Take 0.5-1 tablets (0.125-0.25 mg total) by mouth 3 (three) times daily as needed (anxiety). Take about 3:30 each evening and as needed    Dispense:  90 tablet    Refill:  2  . lamoTRIgine (LAMICTAL) 25 MG tablet    Sig: Take 2 tablets (50 mg total) by mouth 2 (two) times daily.    Dispense:  120 tablet    Refill:  1  . venlafaxine XR (EFFEXOR-XR) 37.5 MG 24 hr capsule    Sig: Take 1 capsule (37.5 mg total) by mouth 2 (two) times daily.    Dispense:  60 capsule    Refill:  1    Medical Decision Making Problem Points:  New problem, with no additional work-up planned (3) and Review of psycho-social stressors (1) Data Points:  Review or order clinical lab tests (1) Review of medication regiment & side effects (2) Review of new medications or change in dosage (2)  I certify that outpatient services furnished can reasonably be expected to improve the patient's condition.   Orson Aloe, MD, [facility]

## 2012-06-29 ENCOUNTER — Telehealth (HOSPITAL_COMMUNITY): Payer: Self-pay | Admitting: Psychiatry

## 2012-06-29 NOTE — Telephone Encounter (Signed)
Fax request from Clinic for Risperidone 1 mg. Called pharmacy. Patient has a refill for both Risperidone 1 mg and 0.25 mg.

## 2012-07-15 ENCOUNTER — Telehealth (HOSPITAL_COMMUNITY): Payer: Self-pay | Admitting: Psychiatry

## 2012-07-15 NOTE — Telephone Encounter (Signed)
No answer on call back.  Message did not identify the owner, so left message that this was Dr W and left call back number.

## 2012-07-16 NOTE — Telephone Encounter (Signed)
Discussed with pt that the tremors started with the addition of the 0.25 mg of Risperdal during the day.  I asked pt to stop that and to come in on Jun 11 at 9:30.  She was agreeable with that plan.

## 2012-07-23 DIAGNOSIS — M545 Low back pain: Secondary | ICD-10-CM | POA: Diagnosis not present

## 2012-07-29 ENCOUNTER — Encounter (HOSPITAL_COMMUNITY): Payer: Self-pay | Admitting: Psychiatry

## 2012-07-29 ENCOUNTER — Ambulatory Visit (INDEPENDENT_AMBULATORY_CARE_PROVIDER_SITE_OTHER): Payer: Medicare Other | Admitting: Psychiatry

## 2012-07-29 ENCOUNTER — Other Ambulatory Visit (HOSPITAL_COMMUNITY): Payer: Self-pay | Admitting: Psychiatry

## 2012-07-29 VITALS — BP 136/79 | Ht 64.0 in | Wt 162.8 lb

## 2012-07-29 DIAGNOSIS — M549 Dorsalgia, unspecified: Secondary | ICD-10-CM

## 2012-07-29 DIAGNOSIS — F489 Nonpsychotic mental disorder, unspecified: Secondary | ICD-10-CM | POA: Diagnosis not present

## 2012-07-29 DIAGNOSIS — F5105 Insomnia due to other mental disorder: Secondary | ICD-10-CM | POA: Diagnosis not present

## 2012-07-29 DIAGNOSIS — F341 Dysthymic disorder: Secondary | ICD-10-CM

## 2012-07-29 DIAGNOSIS — G219 Secondary parkinsonism, unspecified: Secondary | ICD-10-CM

## 2012-07-29 DIAGNOSIS — G2119 Other drug induced secondary parkinsonism: Secondary | ICD-10-CM

## 2012-07-29 MED ORDER — RISPERIDONE 0.25 MG PO TABS: ORAL_TABLET | ORAL | Status: DC

## 2012-07-29 MED ORDER — VENLAFAXINE HCL ER 37.5 MG PO CP24: 37.5000 mg | ORAL_CAPSULE | Freq: Two times a day (BID) | ORAL | Status: DC

## 2012-07-29 MED ORDER — BENZTROPINE MESYLATE 0.5 MG PO TABS: 0.5000 mg | ORAL_TABLET | Freq: Every day | ORAL | Status: DC | PRN

## 2012-07-29 MED ORDER — LAMOTRIGINE 25 MG PO TABS: 50.0000 mg | ORAL_TABLET | Freq: Two times a day (BID) | ORAL | Status: DC

## 2012-07-29 NOTE — Progress Notes (Addendum)
[facility] [address] Progress Note Jamie Pitts MRN: [id number] DOB: 01-03-1934 Age: 79 y.o.  Date: 07/29/2012 Start Time: 10:20 AM End Time: 10:50 AM  Chief Complaint: Chief Complaint  Patient presents with  . Depression  . Follow-up  . Anxiety  . Medication Refill   Subjective: "I have had nervous legs and weak legs and my stomach is all nervous, since I have gotten on the Risperdal". Depression 2/10 and Anxiety 6/10, where 0 is none and 10 is the worst. Pain is 7/10 with her legs and the stiffness  The patient returns for follow-up appointment.  Pt reports that she is compliant with the psychotropic medications good benefit and some side effects.  She notes the above side effects and with her having very slight cog wheeling in her wrists and elbows, I conclude that she is having EPS.  Will cut back on the Risperdal dose and add Cogentin for this.  NEW PROBLEM: Drug induced EPS   LIFE STYLE CHANGES: walking, she has noted with walking her mood was better "It increases your outlook on life".  History of Chief Complaint:   At age 79 pt went to a "fat farm" to fatten her up.  She remembers her father paid more attention to her than her mother did.  She experienced some anxiety when she moved [place].  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  She noted feeling very home sick to go back to [place] her home place in 2010.  Her sister had been very nervous and she was beginning to feel anxious and was started on Klonopin.  ISince then she noted songs for 3 or 4 years.  Then in October of 2013, she started hearing voices non stop and she lost 30 pounds. This prompted her to get a gun and put it under the bed and the family got concerned.  She was admitted to [facility] on January 24th.  She was stopped from the Klonopin and Lithium (pt doesn't understand  how she got on that) and placed on Ripserdal, Trazodone, Lamictal, and Effexor with pretty good results, except she has very dry mouth at night.  Will stop the Trazodone and shift her Risperdal around.  She has noted blurred vision since starting the meds.  She is getting new glasses for herself.  Anxiety Symptoms include decreased concentration and nervous/anxious behavior. Patient reports no confusion, dizziness or suicidal ideas.     Review of Systems  HENT: Negative.   Gastrointestinal: Negative.   Genitourinary: Negative.   Neurological: Positive for weakness and light-headedness. Negative for dizziness, tremors, seizures, syncope, facial asymmetry, speech difficulty, numbness and headaches.       Lightheaded if gets up too fast.  Psychiatric/Behavioral: Positive for dysphoric mood and decreased concentration. Negative for suicidal ideas, hallucinations, behavioral problems, confusion, sleep disturbance, self-injury and agitation. The patient is nervous/anxious. The patient is not hyperactive.    Physical Exam  Depressive Symptoms: none now  (Hypo) Manic Symptoms:   None  Anxiety Symptoms: Excessive Worry:  Yes Panic Symptoms:  No Agoraphobia:  No Obsessive Compulsive: Yes  Symptoms: ordliness Specific Phobias:  Yes Social Anxiety:  No  Psychotic Symptoms:  None  PTSD Symptoms: Ever had a traumatic exposure:  Yes Had a traumatic exposure in the last month:  No No other features  Traumatic Brain Injury: No  Past Psychiatric History: Diagnosis: Depression  Hospitalizations: once [facility]  Outpatient Care: PCP  Substance Abuse Care: none  Self-Mutilation: none  Suicidal Attempts: none  Violent Behaviors: one episode of getting a gun, but now sees that that is not the way to handle her voices    Allergies: Allergies  Allergen Reactions  . Penicillins Hives  . Sulfonamide Derivatives Hives   Past Medical History:   Past Medical History  Diagnosis Date  .  GERD (gastroesophageal reflux disease)   . Thyroid disease   . Osteoporosis   . Gastric pain   . Hiatal hernia   . Adenomatous polyp   . Lumbago    Surgical History: Past Surgical History  Procedure Laterality Date  . Appendectomy    . Cholecystectomy    . Abdominal hysterectomy    . Tonsillectomy    . Oophorectomy      Ectopic pregnancy, removal of right ovary and tube- 1960  . Esophagogastroduodenoscopy    . Ectopic pregnancy surgery    . Rotator cuff repair    . Knee surgery     History of Loss of Consciousness:  No Seizure History:  No Cardiac History:  No Allergies: Allergies  Allergen Reactions  . Penicillins Hives  . Sulfonamide Derivatives Hives   Medical History: Past Medical History  Diagnosis Date  . GERD (gastroesophageal reflux disease)   . Thyroid disease   . Osteoporosis   . Gastric pain   . Hiatal hernia   . Adenomatous polyp   . Lumbago    Surgical History: Past Surgical History  Procedure Laterality Date  . Appendectomy    . Cholecystectomy    . Abdominal hysterectomy    . Tonsillectomy    . Oophorectomy      Ectopic pregnancy, removal of right ovary and tube- 1960  . Esophagogastroduodenoscopy    . Ectopic pregnancy surgery    . Rotator cuff repair    . Knee surgery     Family History: family history includes Anxiety disorder in her grandchild; Cancer - Lung in her sister; Depression in her grandchild; Diabetes in her father, maternal grandmother, mother, and paternal grandmother; Healthy in her sister; and Schizophrenia in her sister.  There is no history of ADD / ADHD, and Alcohol abuse, and Bipolar disorder, and Dementia, and Drug abuse, and OCD, and Paranoid behavior, and Seizures, and Sexual abuse, and Physical abuse, . Reviewed and no changes noted.  Current Medications:  Current Outpatient Prescriptions  Medication Sig Dispense Refill  . lamoTRIgine (LAMICTAL) 25 MG tablet Take 2 tablets (50 mg total) by mouth 2 (two) times  daily.  120 tablet  1  . venlafaxine XR (EFFEXOR-XR) 37.5 MG 24 hr capsule Take 1 capsule (37.5 mg total) by mouth 2 (two) times daily.  60 capsule  1  . benztropine (COGENTIN) 0.5 MG tablet Take 1 tablet (0.5 mg total) by mouth daily as needed (stiffness). MAT CAUSE dry mouth  30 tablet  2  . Cyanocobalamin (VITAMIN B-12 PO) Take 1 tablet by mouth every morning.      . dicyclomine (BENTYL) 10 MG capsule Take 10 mg by mouth daily as needed. For diverticulitis      . docusate sodium (COLACE) 100 MG capsule Take 100 mg by mouth every morning.      Marland Kitchen levothyroxine (SYNTHROID, LEVOTHROID) 88 MCG tablet Take 88 mcg by mouth every morning.       . risperiDONE (RISPERDAL) 0.25 MG tablet Take by mouth a small 1/2 once  or twice a day for anxiety and at bed 1 or a large half for insomnia.May need Cogentin for stiffness  60 tablet  2  . sennosides-docusate sodium (SENOKOT-S) 8.6-50 MG tablet Take 1 tablet by mouth every morning.      . sodium phosphate (FLEET) 7-19 GM/118ML ENEM Place 1 enema rectally once.  1 Bottle  0   No current facility-administered medications for this visit.    Previous Psychotropic Medications:  Medication Dose   Klonopin     Lithium ?    Risperdal    Lamictal    Effexor    Trazodone    Substance Abuse History in the last 12 months: Substance Age of 1st Use Last Use Amount Specific Type  Nicotine  none        Alcohol  20  March 13      Cannabis  none        Opiates  none        Cocaine  none        Methamphetamines  none        LSD  none        Ecstasy  none         Benzod[age]ines  [age]      Caffeine  childhood  this AM      Inhalants  none        Others:       sugar  childhood  this AM     Medical Consequences of Substance Abuse: none  Legal Consequences of Substance Abuse: none  Family Consequences of Substance Abuse: none  Social History: Current Place of Residence: [phone] [place] [address] Place of Birth: [place], [place] Family Members:  husband Marital Status:  Married Children: 1  Sons: 0  Daughters: 1 Relationships: husbandf Education:  Corporate treasurer Problems/Performance: good Religious Beliefs/Practices: [employer] History of Abuse: none Occupational Experiences: clerical, office, Scientist, forensic History:  Electronics engineer History: none Hobbies/Interests: walking, reading, playing cards  Mental Status Examination/Evaluation: Objective:  Appearance: Casual  Eye Contact::  Good  Speech:  Clear and Coherent  Volume:  Normal  Mood:  neutral  Affect:  Congruent  Thought Process:  Coherent, Intact and Linear  Orientation:  Full (Time, Place, and Person)  Thought Content:  WDL  Suicidal Thoughts:  No  Homicidal Thoughts:  No  Judgement:  Good  Insight:  Good  Psychomotor Activity:  Normal  Akathisia:  No  Handed:  Right  AIMS (if indicated):    Assets:  Communication Skills Desire for Improvement   Lab Results:  Results for orders placed during the hospital encounter of 03/14/12 (from the past 8736 hour(s))  URINE RAPID DRUG SCREEN (HOSP PERFORMED)   Collection Time    03/14/12  3:11 AM      Result Value Range   Opiates NONE DETECTED  NONE DETECTED   Cocaine NONE DETECTED  NONE DETECTED   Benzodiazepines NONE DETECTED  NONE DETECTED   Amphetamines NONE DETECTED  NONE DETECTED   Tetrahydrocannabinol NONE DETECTED  NONE DETECTED   Barbiturates NONE DETECTED  NONE DETECTED  ACETAMINOPHEN LEVEL   Collection Time    03/14/12  3:14 AM      Result Value Range   Acetaminophen (Tylenol), Serum <15.0  10 - 30 ug/mL  CBC   Collection Time    03/14/12  3:14 AM      Result Value Range   WBC 12.7 (*) 4.0 - 10.5 K/uL  RBC 5.11  3.87 - 5.11 MIL/uL   Hemoglobin 15.7 (*) 12.0 - 15.0 g/dL   HCT 16.1 (*) 09.6 - 04.5 %   MCV 91.0  78.0 - 100.0 fL   MCH 30.7  26.0 - 34.0 pg   MCHC 33.8  30.0 - 36.0 g/dL   RDW 40.9  81.1 - 91.4 %   Platelets 295  150 - 400 K/uL  COMPREHENSIVE METABOLIC PANEL    Collection Time    03/14/12  3:14 AM      Result Value Range   Sodium 136  135 - 145 mEq/L   Potassium 3.5  3.5 - 5.1 mEq/L   Chloride 98  96 - 112 mEq/L   CO2 25  19 - 32 mEq/L   Glucose, Bld 103 (*) 70 - 99 mg/dL   BUN 11  6 - 23 mg/dL   Creatinine, Ser 7.82 (*) 0.50 - 1.10 mg/dL   Calcium 9.7  8.4 - 95.6 mg/dL   Total Protein 7.8  6.0 - 8.3 g/dL   Albumin 4.1  3.5 - 5.2 g/dL   AST 18  0 - 37 U/L   ALT 11  0 - 35 U/L   Alkaline Phosphatase 67  39 - 117 U/L   Total Bilirubin 0.3  0.3 - 1.2 mg/dL   GFR calc non Af Amer 46 (*) >90 mL/min   GFR calc Af Amer 53 (*) >90 mL/min  ETHANOL   Collection Time    03/14/12  3:14 AM      Result Value Range   Alcohol, Ethyl (B) <11  0 - 11 mg/dL  SALICYLATE LEVEL   Collection Time    03/14/12  3:14 AM      Result Value Range   Salicylate Lvl <2.0 (*) 2.8 - 20.0 mg/dL  URINE CULTURE   Collection Time    03/14/12  3:16 AM      Result Value Range   Specimen Description URINE, CLEAN CATCH     Special Requests NONE     Culture  Setup Time 03/14/2012 14:41     Colony Count 65,000 COLONIES/ML     Culture       Value: Multiple bacterial morphotypes present, none predominant. Suggest appropriate recollection if clinically indicated.   Report Status 03/15/2012 FINAL    URINALYSIS, ROUTINE W REFLEX MICROSCOPIC   Collection Time    03/14/12  3:16 AM      Result Value Range   Color, Urine YELLOW  YELLOW   APPearance CLOUDY (*) CLEAR   Specific Gravity, Urine 1.012  1.005 - 1.030   pH 5.5  5.0 - 8.0   Glucose, UA NEGATIVE  NEGATIVE mg/dL   Hgb urine dipstick SMALL (*) NEGATIVE   Bilirubin Urine NEGATIVE  NEGATIVE   Ketones, ur NEGATIVE  NEGATIVE mg/dL   Protein, ur NEGATIVE  NEGATIVE mg/dL   Urobilinogen, UA 0.2  0.0 - 1.0 mg/dL   Nitrite NEGATIVE  NEGATIVE   Leukocytes, UA LARGE (*) NEGATIVE  URINE MICROSCOPIC-ADD ON   Collection Time    03/14/12  3:16 AM      Result Value Range   Squamous Epithelial / LPF FEW (*) RARE   WBC, UA TOO  NUMEROUS TO COUNT  <3 WBC/hpf   RBC / HPF 0-2  <3 RBC/hpf   Bacteria, UA FEW (*) RARE   Urine-Other LESS THAN 10 mL OF URINE SUBMITTED    TSH   Collection Time    03/14/12  1:12 PM  Result Value Range   TSH 0.674  0.350 - 4.500 uIU/mL  T4   Collection Time    03/14/12  1:12 PM      Result Value Range   T4, Total 13.4 (*) 5.0 - 12.5 ug/dL   Assessment:    AXIS I Major Depression, single episode  AXIS II Deferred  AXIS III Past Medical History  Diagnosis Date  . GERD (gastroesophageal reflux disease)   . Thyroid disease   . Osteoporosis   . Gastric pain   . Hiatal hernia   . Adenomatous polyp   . Lumbago      AXIS IV economic problems and other psychosocial or environmental problems  AXIS V 61-70 mild symptoms   Treatment Plan/Recommendations:  Laboratory:  Vitamin D level get results  Psychotherapy: supportive  Medications: Risperdal, Lamictal, Effexor  Routine PRN Medications:  No  Consultations: none  Safety Concerns:  none  Other:     Plan/Discussion: I took her vitals.  I reviewed CC, tobacco/med/surg Hx, meds effects/ side effects, problem list, therapies and responses as well as current situation/symptoms discussed options. Reduce Ripserdal because of side effects and add PRN Cogentin for the stiffness.  Try to recapture the creative spot in your life. See orders and pt instructions for more details.  MEDICATIONS this encounter: Meds ordered this encounter  Medications  . risperiDONE (RISPERDAL) 0.25 MG tablet    Sig: Take by mouth a small 1/2 once or twice a day for anxiety and at bed 1 or a large half for insomnia.May need Cogentin for stiffness    Dispense:  60 tablet    Refill:  2  . benztropine (COGENTIN) 0.5 MG tablet    Sig: Take 1 tablet (0.5 mg total) by mouth daily as needed (stiffness). MAT CAUSE dry mouth    Dispense:  30 tablet    Refill:  2  . venlafaxine XR (EFFEXOR-XR) 37.5 MG 24 hr capsule    Sig: Take 1 capsule (37.5 mg total) by  mouth 2 (two) times daily.    Dispense:  60 capsule    Refill:  1  . lamoTRIgine (LAMICTAL) 25 MG tablet    Sig: Take 2 tablets (50 mg total) by mouth 2 (two) times daily.    Dispense:  120 tablet    Refill:  1    Medical Decision Making Problem Points:  Established problem, stable/improving (1), New problem, with no additional work-up planned (3), Review of last therapy session (1) and Review of psycho-social stressors (1) Data Points:  Review or order clinical lab tests (1) Review of medication regiment & side effects (2) Review of new medications or change in dosage (2)  I certify that outpatient services furnished can reasonably be expected to improve the patient's condition.   Orson Aloe, MD, [facility]

## 2012-07-29 NOTE — Patient Instructions (Signed)
Get back to noticing Ashby Dawes and listen to the sounds of Ashby Dawes and try to recapture your creative place.  Keep the Walking up.  Call me back in a week of so to tell me what the med changes are doing and not doing for you.  Call if problems or concerns.

## 2012-07-31 ENCOUNTER — Telehealth (HOSPITAL_COMMUNITY): Payer: Self-pay | Admitting: Psychiatry

## 2012-07-31 DIAGNOSIS — G2119 Other drug induced secondary parkinsonism: Secondary | ICD-10-CM

## 2012-07-31 MED ORDER — BENZTROPINE MESYLATE 1 MG PO TABS: 1.0000 mg | ORAL_TABLET | Freq: Every day | ORAL | Status: DC | PRN

## 2012-07-31 NOTE — Telephone Encounter (Signed)
Script sent per pt request

## 2012-08-10 DIAGNOSIS — J342 Deviated nasal septum: Secondary | ICD-10-CM | POA: Diagnosis not present

## 2012-08-10 DIAGNOSIS — H6120 Impacted cerumen, unspecified ear: Secondary | ICD-10-CM | POA: Diagnosis not present

## 2012-08-10 DIAGNOSIS — J3089 Other allergic rhinitis: Secondary | ICD-10-CM | POA: Diagnosis not present

## 2012-08-10 DIAGNOSIS — J3489 Other specified disorders of nose and nasal sinuses: Secondary | ICD-10-CM | POA: Diagnosis not present

## 2012-08-11 DIAGNOSIS — E039 Hypothyroidism, unspecified: Secondary | ICD-10-CM | POA: Diagnosis not present

## 2012-08-11 DIAGNOSIS — E785 Hyperlipidemia, unspecified: Secondary | ICD-10-CM | POA: Diagnosis not present

## 2012-08-11 DIAGNOSIS — E559 Vitamin D deficiency, unspecified: Secondary | ICD-10-CM | POA: Diagnosis not present

## 2012-08-11 DIAGNOSIS — R05 Cough: Secondary | ICD-10-CM | POA: Diagnosis not present

## 2012-08-19 ENCOUNTER — Ambulatory Visit (HOSPITAL_COMMUNITY): Payer: Self-pay | Admitting: Psychiatry

## 2012-09-01 ENCOUNTER — Ambulatory Visit (HOSPITAL_COMMUNITY): Payer: Self-pay | Admitting: Psychiatry

## 2012-09-03 ENCOUNTER — Ambulatory Visit (HOSPITAL_COMMUNITY): Payer: Self-pay | Admitting: Psychiatry

## 2012-09-03 ENCOUNTER — Telehealth (HOSPITAL_COMMUNITY): Payer: Self-pay | Admitting: Psychiatry

## 2012-09-04 ENCOUNTER — Other Ambulatory Visit (HOSPITAL_COMMUNITY): Payer: Self-pay | Admitting: Psychiatry

## 2012-09-22 ENCOUNTER — Other Ambulatory Visit: Payer: Self-pay | Admitting: *Deleted

## 2012-09-22 DIAGNOSIS — E039 Hypothyroidism, unspecified: Secondary | ICD-10-CM

## 2012-09-23 ENCOUNTER — Other Ambulatory Visit: Payer: Self-pay

## 2012-09-30 ENCOUNTER — Ambulatory Visit: Payer: Self-pay | Admitting: Endocrinology

## 2012-09-30 DIAGNOSIS — Z0289 Encounter for other administrative examinations: Secondary | ICD-10-CM

## 2012-10-07 ENCOUNTER — Ambulatory Visit (HOSPITAL_COMMUNITY): Payer: Self-pay | Admitting: Psychiatry

## 2012-10-07 ENCOUNTER — Ambulatory Visit (HOSPITAL_COMMUNITY): Payer: Medicare Other | Admitting: Psychiatry

## 2012-10-14 ENCOUNTER — Ambulatory Visit (HOSPITAL_COMMUNITY): Payer: Medicare Other | Admitting: Psychiatry

## 2012-10-20 ENCOUNTER — Ambulatory Visit (HOSPITAL_COMMUNITY): Payer: Self-pay | Admitting: Psychiatry

## 2012-10-26 ENCOUNTER — Other Ambulatory Visit (HOSPITAL_COMMUNITY): Payer: Self-pay | Admitting: Psychiatry

## 2012-10-26 ENCOUNTER — Ambulatory Visit (HOSPITAL_COMMUNITY): Payer: Self-pay | Admitting: Psychiatry

## 2012-10-27 ENCOUNTER — Ambulatory Visit (HOSPITAL_COMMUNITY): Payer: Self-pay | Admitting: Psychiatry

## 2012-10-28 ENCOUNTER — Other Ambulatory Visit (HOSPITAL_COMMUNITY): Payer: Self-pay | Admitting: Psychiatry

## 2012-10-28 ENCOUNTER — Telehealth (HOSPITAL_COMMUNITY): Payer: Self-pay | Admitting: Psychiatry

## 2012-10-28 NOTE — Telephone Encounter (Signed)
30 d supply faxed, needs appt

## 2012-10-29 ENCOUNTER — Encounter (HOSPITAL_COMMUNITY): Payer: Self-pay | Admitting: Psychiatry

## 2012-10-30 ENCOUNTER — Telehealth (HOSPITAL_COMMUNITY): Payer: Self-pay | Admitting: Physician Assistant

## 2012-10-30 DIAGNOSIS — G2119 Other drug induced secondary parkinsonism: Secondary | ICD-10-CM

## 2012-10-30 MED ORDER — BENZTROPINE MESYLATE 1 MG PO TABS: 1.0000 mg | ORAL_TABLET | Freq: Every day | ORAL | Status: DC | PRN

## 2012-11-04 ENCOUNTER — Encounter (HOSPITAL_COMMUNITY): Payer: Self-pay | Admitting: Psychiatry

## 2012-11-04 ENCOUNTER — Ambulatory Visit (INDEPENDENT_AMBULATORY_CARE_PROVIDER_SITE_OTHER): Payer: Medicare Other | Admitting: Psychiatry

## 2012-11-04 VITALS — BP 115/70 | Ht 63.0 in | Wt 149.0 lb

## 2012-11-04 DIAGNOSIS — F29 Unspecified psychosis not due to a substance or known physiological condition: Secondary | ICD-10-CM | POA: Diagnosis not present

## 2012-11-04 DIAGNOSIS — F329 Major depressive disorder, single episode, unspecified: Secondary | ICD-10-CM

## 2012-11-04 DIAGNOSIS — F341 Dysthymic disorder: Secondary | ICD-10-CM

## 2012-11-04 DIAGNOSIS — G2119 Other drug induced secondary parkinsonism: Secondary | ICD-10-CM

## 2012-11-04 MED ORDER — VENLAFAXINE HCL ER 37.5 MG PO CP24: 37.5000 mg | ORAL_CAPSULE | Freq: Two times a day (BID) | ORAL | Status: DC

## 2012-11-04 MED ORDER — RISPERIDONE 1 MG PO TABS: 1.0000 mg | ORAL_TABLET | Freq: Two times a day (BID) | ORAL | Status: DC

## 2012-11-04 MED ORDER — BENZTROPINE MESYLATE 1 MG PO TABS: 1.0000 mg | ORAL_TABLET | Freq: Two times a day (BID) | ORAL | Status: DC

## 2012-11-04 MED ORDER — LAMOTRIGINE 25 MG PO TABS: 50.0000 mg | ORAL_TABLET | Freq: Two times a day (BID) | ORAL | Status: DC

## 2012-11-04 NOTE — Progress Notes (Signed)
Patient ID: Jamie Pitts, female   DOB: 05/01/1933, 79 y.o.   MRN: [id number] [facility] [address] Progress Note Jamie Pitts MRN: [id number] DOB: April 11, 1933 Age: 79 y.o.  Date: 11/04/2012 Start Time: 10:20 AM End Time: 10:50 AM  Chief Complaint: Chief Complaint  Patient presents with  . Paranoid  . Follow-up  . Medication Refill   Subjective: "I'm still hearing voices."   This patient is a 79 year old married white female who lives with her husband in [place]. She has one daughter and 3 grandchildren. She is retired from the [employer].  Apparently the patient had some sort of psychotic episode in 2013. She was admitted to [facility] because she was hearing voices at home. The voices got so bad that she cannot put a gun under the bed and wanted to shoot him. The patient is a poor historian and is hard of hearing and gets easily confused. She was placed on various medicines in the hospital which have been continued. It's unclear if she is medication compliant. For example she's not taking the Cogentin. She repeats herself a lot.  The patient states she still hearing voices every day. They're not frightening her like they were before but there seemed to her and she can't always get them to stop. She sleeping pretty well she denies being depressed but she is obviously not thinking clearly. She mentions that she's been diagnosed with parkinsonism but she doesn't have a tremor.    History of Chief Complaint:   At age 89 pt went to a "fat farm" to fatten her up.  She remembers her father paid more attention to her than her mother did.  She experienced some anxiety when she moved [place].  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  She noted feeling very home sick to go back to [place] her home place in 2010.  Her sister had been very nervous and she was  beginning to feel anxious and was started on Klonopin.  ISince then she noted songs for 3 or 4 years.  Then in October of 2013, she started hearing voices non stop and she lost 30 pounds. This prompted her to get a gun and put it under the bed and the family got concerned.  She was admitted to [facility] on January 24th.  She was stopped from the Klonopin and Lithium (pt doesn't understand how she got on that) and placed on Ripserdal, Trazodone, Lamictal, and Effexor with pretty good results, except she has very dry mouth at nigh  She has noted blurred vision since starting the meds.  She is getting new glasses for herself.  Anxiety Symptoms include confusion, decreased concentration, dry mouth and nervous/anxious behavior. Patient reports no dizziness or suicidal ideas.     Review of Systems  HENT: Negative.   Gastrointestinal: Negative.   Genitourinary: Negative.   Neurological: Positive for weakness and light-headedness. Negative for dizziness, tremors, seizures, syncope, facial asymmetry, speech difficulty, numbness and headaches.       Lightheaded if gets up too fast.  Psychiatric/Behavioral: Positive for confusion, dysphoric mood and decreased concentration. Negative for suicidal ideas, hallucinations, behavioral problems, sleep disturbance, self-injury and agitation. The patient is nervous/anxious. The patient is not hyperactive.    Physical Exam  Depressive Symptoms: none now  (Hypo) Manic Symptoms:   None  Anxiety Symptoms:  Excessive Worry:  Yes Panic Symptoms:  No Agoraphobia:  No Obsessive Compulsive: Yes  Symptoms: ordliness Specific Phobias:  Yes Social Anxiety:  No  Psychotic Symptoms:  None  PTSD Symptoms: Ever had a traumatic exposure:  Yes Had a traumatic exposure in the last month:  No No other features  Traumatic Brain Injury: No   Past Psychiatric History: Diagnosis: Depression  Hospitalizations: once [facility]  Outpatient Care: PCP   Substance Abuse Care: none  Self-Mutilation: none  Suicidal Attempts: none  Violent Behaviors: one episode of getting a gun, but now sees that that is not the way to handle her voices    Allergies: Allergies  Allergen Reactions  . Penicillins Hives  . Sulfonamide Derivatives Hives   Past Medical History:   Past Medical History  Diagnosis Date  . GERD (gastroesophageal reflux disease)   . Thyroid disease   . Osteoporosis   . Gastric pain   . Hiatal hernia   . Adenomatous polyp   . Lumbago    Surgical History: Past Surgical History  Procedure Laterality Date  . Appendectomy    . Cholecystectomy    . Abdominal hysterectomy    . Tonsillectomy    . Oophorectomy      Ectopic pregnancy, removal of right ovary and tube- 1960  . Esophagogastroduodenoscopy    . Ectopic pregnancy surgery    . Rotator cuff repair    . Knee surgery     History of Loss of Consciousness:  No Seizure History:  No Cardiac History:  No Allergies: Allergies  Allergen Reactions  . Penicillins Hives  . Sulfonamide Derivatives Hives   Medical History: Past Medical History  Diagnosis Date  . GERD (gastroesophageal reflux disease)   . Thyroid disease   . Osteoporosis   . Gastric pain   . Hiatal hernia   . Adenomatous polyp   . Lumbago    Surgical History: Past Surgical History  Procedure Laterality Date  . Appendectomy    . Cholecystectomy    . Abdominal hysterectomy    . Tonsillectomy    . Oophorectomy      Ectopic pregnancy, removal of right ovary and tube- 1960  . Esophagogastroduodenoscopy    . Ectopic pregnancy surgery    . Rotator cuff repair    . Knee surgery     Family History: family history includes Anxiety disorder in her grandchild; Cancer - Lung in her sister; Depression in her grandchild; Diabetes in her father, maternal grandmother, mother, and paternal grandmother; Healthy in her sister; Schizophrenia in her sister. There is no history of ADD / ADHD, Alcohol abuse,  Bipolar disorder, Dementia, Drug abuse, OCD, Paranoid behavior, Seizures, Sexual abuse, or Physical abuse. Reviewed and no changes noted.  Current Medications:  Current Outpatient Prescriptions  Medication Sig Dispense Refill  . Cyanocobalamin (VITAMIN B-12 PO) Take 1 tablet by mouth every morning.      . dicyclomine (BENTYL) 10 MG capsule Take 10 mg by mouth daily as needed. For diverticulitis      . docusate sodium (COLACE) 100 MG capsule Take 100 mg by mouth every morning.      . lamoTRIgine (LAMICTAL) 25 MG tablet Take 2 tablets (50 mg total) by mouth 2 (two) times daily.  120 tablet  1  . levothyroxine (SYNTHROID, LEVOTHROID) 88 MCG tablet Take 88 mcg by mouth every morning.       . venlafaxine XR (EFFEXOR-XR) 37.5 MG 24 hr capsule Take 1 capsule (37.5 mg total) by mouth 2 (two)  times daily.  60 capsule  1  . benztropine (COGENTIN) 1 MG tablet Take 1 tablet (1 mg total) by mouth 2 (two) times daily.  60 tablet  2  . risperiDONE (RISPERDAL) 1 MG tablet Take 1 tablet (1 mg total) by mouth 2 (two) times daily.  60 tablet  2  . sennosides-docusate sodium (SENOKOT-S) 8.6-50 MG tablet Take 1 tablet by mouth every morning.      . sodium phosphate (FLEET) 7-19 GM/118ML ENEM Place 1 enema rectally once.  1 Bottle  0   No current facility-administered medications for this visit.    Previous Psychotropic Medications:  Medication Dose   Klonopin     Lithium ?    Risperdal    Lamictal    Effexor    Trazodone    Substance Abuse History in the last 12 months: Substance Age of 1st Use Last Use Amount Specific Type  Nicotine  none        Alcohol  20  March 13      Cannabis  none        Opiates  none        Cocaine  none        Methamphetamines  none        LSD  none        Ecstasy  none         Benzodiazepines  [age]      Caffeine  childhood  this AM      Inhalants  none        Others:       sugar  childhood  this AM     Medical Consequences of Substance Abuse: none  Legal  Consequences of Substance Abuse: none  Family Consequences of Substance Abuse: none  Social History: Current Place of Residence: [address] [place] [address] Place of Birth: [place], [place] Family Members: husband Marital Status:  Married Children: 1  Sons: 0  Daughters: 1 Relationships: husbandf Education:  Corporate treasurer Problems/Performance: good Religious Beliefs/Practices: [employer] History of Abuse: none Occupational Experiences: clerical, office, Scientist, forensic History:  Electronics engineer History: none Hobbies/Interests: walking, reading, playing cards  Mental Status Examination/Evaluation: Objective:  Appearance: Casual  Eye Contact::  Good  Speech:  Confused and rambling   Volume:  Normal  Mood:  neutral  Affect:  Congruent  Thought Process confused disorganized and repeats herself numerous times. She does not recall things I just told her a minute ago   Orientation:  Full (Time, Place, and Person)  Thought Content:  Recurrent auditory hallucinations and singing   Suicidal Thoughts:  No  Homicidal Thoughts:  No  Judgement:  Good  Insight:  Good  Psychomotor Activity:  Normal  Akathisia:  No  Handed:  Right  AIMS (if indicated):    Assets:  Communication Skills Desire for Improvement   Lab Results:  Results for orders placed during the hospital encounter of 03/14/12 (from the past 8736 hour(s))  URINE RAPID DRUG SCREEN (HOSP PERFORMED)   Collection Time    03/14/12  3:11 AM      Result Value Range   Opiates NONE DETECTED  NONE DETECTED   Cocaine NONE DETECTED  NONE DETECTED   Benzodiazepines NONE DETECTED  NONE DETECTED   Amphetamines NONE DETECTED  NONE DETECTED   Tetrahydrocannabinol NONE DETECTED  NONE DETECTED   Barbiturates NONE DETECTED  NONE DETECTED  ACETAMINOPHEN LEVEL   Collection Time    03/14/12  3:14 AM      Result Value Range   Acetaminophen (Tylenol), Serum <15.0  10 - 30 ug/mL  CBC   Collection Time     03/14/12  3:14 AM      Result Value Range   WBC 12.7 (*) 4.0 - 10.5 K/uL   RBC 5.11  3.87 - 5.11 MIL/uL   Hemoglobin 15.7 (*) 12.0 - 15.0 g/dL   HCT 63.8 (*) 75.6 - 43.3 %   MCV 91.0  78.0 - 100.0 fL   MCH 30.7  26.0 - 34.0 pg   MCHC 33.8  30.0 - 36.0 g/dL   RDW 29.5  18.8 - 41.6 %   Platelets 295  150 - 400 K/uL  COMPREHENSIVE METABOLIC PANEL   Collection Time    03/14/12  3:14 AM      Result Value Range   Sodium 136  135 - 145 mEq/L   Potassium 3.5  3.5 - 5.1 mEq/L   Chloride 98  96 - 112 mEq/L   CO2 25  19 - 32 mEq/L   Glucose, Bld 103 (*) 70 - 99 mg/dL   BUN 11  6 - 23 mg/dL   Creatinine, Ser 6.06 (*) 0.50 - 1.10 mg/dL   Calcium 9.7  8.4 - 30.1 mg/dL   Total Protein 7.8  6.0 - 8.3 g/dL   Albumin 4.1  3.5 - 5.2 g/dL   AST 18  0 - 37 U/L   ALT 11  0 - 35 U/L   Alkaline Phosphatase 67  39 - 117 U/L   Total Bilirubin 0.3  0.3 - 1.2 mg/dL   GFR calc non Af Amer 46 (*) >90 mL/min   GFR calc Af Amer 53 (*) >90 mL/min  ETHANOL   Collection Time    03/14/12  3:14 AM      Result Value Range   Alcohol, Ethyl (B) <11  0 - 11 mg/dL  SALICYLATE LEVEL   Collection Time    03/14/12  3:14 AM      Result Value Range   Salicylate Lvl <2.0 (*) 2.8 - 20.0 mg/dL  URINE CULTURE   Collection Time    03/14/12  3:16 AM      Result Value Range   Specimen Description URINE, CLEAN CATCH     Special Requests NONE     Culture  Setup Time 03/14/2012 14:41     Colony Count 65,000 COLONIES/ML     Culture       Value: Multiple bacterial morphotypes present, none predominant. Suggest appropriate recollection if clinically indicated.   Report Status 03/15/2012 FINAL    URINALYSIS, ROUTINE W REFLEX MICROSCOPIC   Collection Time    03/14/12  3:16 AM      Result Value Range   Color, Urine YELLOW  YELLOW   APPearance CLOUDY (*) CLEAR   Specific Gravity, Urine 1.012  1.005 - 1.030   pH 5.5  5.0 - 8.0   Glucose, UA NEGATIVE  NEGATIVE mg/dL   Hgb urine dipstick SMALL (*) NEGATIVE   Bilirubin  Urine NEGATIVE  NEGATIVE   Ketones, ur NEGATIVE  NEGATIVE mg/dL   Protein, ur NEGATIVE  NEGATIVE mg/dL   Urobilinogen, UA 0.2  0.0 - 1.0 mg/dL   Nitrite NEGATIVE  NEGATIVE   Leukocytes, UA LARGE (*) NEGATIVE  URINE MICROSCOPIC-ADD ON   Collection Time    03/14/12  3:16 AM      Result Value Range   Squamous Epithelial / LPF FEW (*) RARE  WBC, UA TOO NUMEROUS TO COUNT  <3 WBC/hpf   RBC / HPF 0-2  <3 RBC/hpf   Bacteria, UA FEW (*) RARE   Urine-Other LESS THAN 10 mL OF URINE SUBMITTED    TSH   Collection Time    03/14/12  1:12 PM      Result Value Range   TSH 0.674  0.350 - 4.500 uIU/mL  T4   Collection Time    03/14/12  1:12 PM      Result Value Range   T4, Total 13.4 (*) 5.0 - 12.5 ug/dL   Assessment:    AXIS I  major depression with psychotic features, rule out dementia   AXIS II Deferred  AXIS III Past Medical History  Diagnosis Date  . GERD (gastroesophageal reflux disease)   . Thyroid disease   . Osteoporosis   . Gastric pain   . Hiatal hernia   . Adenomatous polyp   . Lumbago      AXIS IV economic problems and other psychosocial or environmental problems  AXIS V 61-70 mild symptoms   Treatment Plan/Recommendations:  Laboratory:  Vitamin D level get results  Psychotherapy: supportive  Medications: Risperdal, Lamictal, Effexor  Routine PRN Medications:  No  Consultations: none  Safety Concerns:  none  Other:     Plan/Discussion: I took her vitals.  I reviewed CC, tobacco/med/surg Hx, meds effects/ side effects, problem list, therapies and responses as well as current situation/symptoms discussed options. Reduce Ripserdal because of side effects and add PRN Cogentin for the stiffness.  Try to recapture the creative spot in your life. See orders and pt instructions for more details.  MEDICATIONS this encounter: Meds ordered this encounter  Medications  . risperiDONE (RISPERDAL) 1 MG tablet    Sig: Take 1 tablet (1 mg total) by mouth 2 (two) times daily.     Dispense:  60 tablet    Refill:  2  . benztropine (COGENTIN) 1 MG tablet    Sig: Take 1 tablet (1 mg total) by mouth 2 (two) times daily.    Dispense:  60 tablet    Refill:  2  . venlafaxine XR (EFFEXOR-XR) 37.5 MG 24 hr capsule    Sig: Take 1 capsule (37.5 mg total) by mouth 2 (two) times daily.    Dispense:  60 capsule    Refill:  1  . lamoTRIgine (LAMICTAL) 25 MG tablet    Sig: Take 2 tablets (50 mg total) by mouth 2 (two) times daily.    Dispense:  120 tablet    Refill:  1    Medical Decision Making Problem Points:  Established problem, stable/improving (1), New problem, with no additional work-up planned (3), Review of last therapy session (1) and Review of psycho-social stressors (1) Data Points:  Review or order clinical lab tests (1) Review of medication regiment & side effects (2) Review of new medications or change in dosage (2) patient will increase Risperdal to 1 mg twice a day. She will add Cogentin 1 mg twice a day with that. We will get [facility]. I will refer her to a psychologist for neuropsychological testing. She'll return in four-week's I certify that outpatient services furnished can reasonably be expected to improve the patient's condition.   Diannia Ruder, MD

## 2012-11-05 ENCOUNTER — Encounter: Payer: Self-pay | Admitting: Family Medicine

## 2012-11-05 ENCOUNTER — Ambulatory Visit (INDEPENDENT_AMBULATORY_CARE_PROVIDER_SITE_OTHER): Payer: Medicare Other | Admitting: Family Medicine

## 2012-11-05 VITALS — BP 114/70 | HR 88 | Temp 98.3°F | Ht 64.0 in | Wt 148.2 lb

## 2012-11-05 DIAGNOSIS — F341 Dysthymic disorder: Secondary | ICD-10-CM | POA: Diagnosis not present

## 2012-11-05 DIAGNOSIS — R1310 Dysphagia, unspecified: Secondary | ICD-10-CM | POA: Insufficient documentation

## 2012-11-05 DIAGNOSIS — E860 Dehydration: Secondary | ICD-10-CM | POA: Diagnosis not present

## 2012-11-05 DIAGNOSIS — E039 Hypothyroidism, unspecified: Secondary | ICD-10-CM

## 2012-11-05 DIAGNOSIS — G2119 Other drug induced secondary parkinsonism: Secondary | ICD-10-CM

## 2012-11-05 DIAGNOSIS — R8290 Unspecified abnormal findings in urine: Secondary | ICD-10-CM

## 2012-11-05 DIAGNOSIS — R82998 Other abnormal findings in urine: Secondary | ICD-10-CM

## 2012-11-05 DIAGNOSIS — R197 Diarrhea, unspecified: Secondary | ICD-10-CM | POA: Diagnosis not present

## 2012-11-05 DIAGNOSIS — G219 Secondary parkinsonism, unspecified: Secondary | ICD-10-CM

## 2012-11-05 LAB — CBC WITH DIFFERENTIAL/PLATELET
Basophils Relative: 0.6 % (ref 0.0–3.0)
Eosinophils Absolute: 0.1 10*3/uL (ref 0.0–0.7)
Eosinophils Relative: 0.8 % (ref 0.0–5.0)
HCT: 38 % (ref 36.0–46.0)
Lymphs Abs: 1.3 10*3/uL (ref 0.7–4.0)
MCHC: 33.2 g/dL (ref 30.0–36.0)
MCV: 84.4 fl (ref 78.0–100.0)
Monocytes Absolute: 0.6 10*3/uL (ref 0.1–1.0)
Neutrophils Relative %: 73.7 % (ref 43.0–77.0)
Platelets: 372 10*3/uL (ref 150.0–400.0)
RBC: 4.51 Mil/uL (ref 3.87–5.11)
WBC: 7.7 10*3/uL (ref 4.5–10.5)

## 2012-11-05 LAB — COMPREHENSIVE METABOLIC PANEL
Alkaline Phosphatase: 82 U/L (ref 39–117)
CO2: 25 mEq/L (ref 19–32)
Creatinine, Ser: 1 mg/dL (ref 0.4–1.2)
GFR: 59.18 mL/min — ABNORMAL LOW (ref >60.00)
Glucose, Bld: 84 mg/dL (ref 70–99)
Total Bilirubin: 0.5 mg/dL (ref 0.3–1.2)

## 2012-11-05 NOTE — Patient Instructions (Addendum)
Sign form for records from prior PCP. Sounds like the diarrhea is slowly improving. I'd avoid laxatives like dulcolax.  In the future, may take docusate or colace stool softener as needed. Blood work today, urine checked today. If diarrhea returning, please let me know to order stool studies. Very important to push small sips of clear liquids over next few days to ensure you stay well hydrated.  May start eating when appetite starts returning. Good to meet you today, call us with questions.

## 2012-11-05 NOTE — Assessment & Plan Note (Signed)
?  of this in chart.  Some masked fascies present today.  Monitor for now.

## 2012-11-05 NOTE — Assessment & Plan Note (Addendum)
3-4 wk h/o diarrhea after initial constipation anticipate precipitated by excess laxative use. As improving, advised to monitor for now. Discussed importance of hydration status with small sips of fluids throughout the day. Check UA and lytes/CBC today for further evaluation of hydration status as well as other etiology Pt unable to provide UA - will bring back today from home ( sent home with sterile urine cup)

## 2012-11-05 NOTE — Progress Notes (Signed)
Subjective:    Patient ID: Jamie Pitts, female    DOB: 1934-01-09, 79 y.o.   MRN: [id number]  HPI CC: new pt to establish  Prior saw Dr. Gerre Scull in [place].  Will sign ROI for records from prior PCP.  Presented with husband and daughter today but she asked them to stay out in waiting room. Has been in [place] for last 8 years.  Prior lived in [place].  Had been driving to [place] twice a year to see prior PCP.  Severe depression with psychosis - sees [facility].  Hears voices.  This has been ongoing since 11/2011.  Denies bipolar dx.  On cogentin, lamictal, risperdal, and effexor xr.  Compliant with this regimen.  Diarrhea - 6 wks ago started feeling ill - constipated.  Taking dulcolax - this led to diarrhea for last 3-4 weeks.  Continued taking dulcolax until she realized she should stop it - stopped 2 wks ago.  Over last few days stool is improving, more solid.  No abd pain, blood in stool.  No vomiting.  Some dysphagia to solid foods - worse with bread.  No early satiety.  Lost 10 lbs with diarrhea.  Last diarrheal episode was yesterday - small amt loose, prior to this more watery.  Today no stool yet.  Appetite decreased. No recent abx use.  Uses city water.  No new foods, no recent travel.  No sick contacts at home. Denies urinary sxs such as frequency, urgency.   Wt Readings from Last 3 Encounters:  11/05/12 148 lb 4 oz (67.246 kg)  11/04/12 149 lb (67.586 kg)  07/29/12 162 lb 12.8 oz (73.846 kg)    Preventative: Last CPE 1 yr ago. [place] done around 2009 - [place] per [place]), one polyp. Mammogram 2008 - WNL G2P1  Lives with husband and grandson Gerilyn Pilgrim) Occ: retired, Dietitian Edu: [employer]  Medications and allergies reviewed and updated in chart.  Past histories reviewed and updated if relevant as below. Patient Active Problem List   Diagnosis Date Noted  . Drug-induced Parkinsonism 07/29/2012  . Insomnia secondary to depression  with anxiety 05/22/2012  . BACK PAIN 04/14/2007  . MUSCLE SPASM 04/14/2007  . NECK AND BACK PAIN 12/23/2006  . ALLERGIC RHINITIS 12/20/2006  . HYPOTHYROIDISM 11/04/2006  . GERD 11/04/2006  . OSTEOPOROSIS 11/04/2006  . ANXIETY DEPRESSION 08/29/2006   Past Medical History  Diagnosis Date  . GERD (gastroesophageal reflux disease)     intermittent, treated with tums  . Hypothyroidism   . Scoliosis   . Hiatal hernia   . Adenomatous polyp     x1  . Lumbago   . Depression   . Diverticulitis   . Personal history of kidney stones     x1   Past Surgical History  Procedure Laterality Date  . Appendectomy  1954  . Cholecystectomy    . Abdominal hysterectomy  1979    ectopic pregnancy with one ovary removed  . Tonsillectomy    . Oophorectomy Right 1965    Ectopic pregnancy, removal of right ovary and tube- 1960  . Esophagogastroduodenoscopy    . Rotator cuff repair Right   . Knee surgery     History  Substance Use Topics  . Smoking status: Never Smoker   . Smokeless tobacco: Never Used  . Alcohol Use: No   Family History  Problem Relation Age of Onset  . Schizophrenia Sister   . Alcohol abuse Neg Hx   . Bipolar disorder Neg Hx   .  Dementia Neg Hx   . Drug abuse Neg Hx   . OCD Neg Hx   . Paranoid behavior Neg Hx   . Seizures Neg Hx   . Sexual abuse Neg Hx   . Physical abuse Neg Hx   . Anxiety disorder Grandchild   . Depression Grandchild   . Cancer Sister     lung  . Healthy Sister   . Diabetes Mother   . Diabetes Father   . Diabetes Sister   . Diabetes Brother   . Cancer Sister     kidney   Allergies  Allergen Reactions  . Amoxicillin Hives    Sores in mouth  . Sulfonamide Derivatives Hives    Sores in mouth   Current Outpatient Prescriptions on File Prior to Visit  Medication Sig Dispense Refill  . benztropine (COGENTIN) 1 MG tablet Take 1 tablet (1 mg total) by mouth 2 (two) times daily.  60 tablet  2  . dicyclomine (BENTYL) 10 MG capsule Take 10 mg  by mouth daily as needed. For diverticulitis      . lamoTRIgine (LAMICTAL) 25 MG tablet Take 2 tablets (50 mg total) by mouth 2 (two) times daily.  120 tablet  1  . levothyroxine (SYNTHROID, LEVOTHROID) 88 MCG tablet Take 88 mcg by mouth every morning.       . risperiDONE (RISPERDAL) 1 MG tablet Take 1 tablet (1 mg total) by mouth 2 (two) times daily.  60 tablet  2  . venlafaxine XR (EFFEXOR-XR) 37.5 MG 24 hr capsule Take 1 capsule (37.5 mg total) by mouth 2 (two) times daily.  60 capsule  1   No current facility-administered medications on file prior to visit.     Review of Systems  Constitutional: Positive for appetite change and unexpected weight change. Negative for fever, chills, activity change and fatigue.  HENT: Negative for hearing loss and neck pain.   Eyes: Negative for visual disturbance.  Respiratory: Negative for cough, chest tightness, shortness of breath and wheezing.   Cardiovascular: Negative for chest pain, palpitations and leg swelling.  Gastrointestinal: Positive for diarrhea and constipation. Negative for nausea, vomiting, abdominal pain, blood in stool and abdominal distention.  Genitourinary: Negative for hematuria and difficulty urinating.  Musculoskeletal: Negative for myalgias and arthralgias.  Skin: Negative for rash.  Neurological: Negative for dizziness, seizures, syncope and headaches.  Hematological: Negative for adenopathy. Does not bruise/bleed easily.  Psychiatric/Behavioral: Positive for dysphoric mood. The patient is nervous/anxious.        Objective:   Physical Exam  Nursing note and vitals reviewed. Constitutional: She is oriented to person, place, and time. She appears well-developed and well-nourished. No distress.  HENT:  Head: Normocephalic and atraumatic.  Right Ear: Hearing, tympanic membrane, external ear and ear canal normal.  Left Ear: Hearing, tympanic membrane, external ear and ear canal normal.  Nose: Nose normal.  Mouth/Throat: No  oropharyngeal exudate.  Somewhat dry MM, cherry red tongue  Eyes: Conjunctivae and EOM are normal. Pupils are equal, round, and reactive to light. No scleral icterus.  Neck: Normal range of motion. Neck supple.  Cardiovascular: Normal rate, regular rhythm, normal heart sounds and intact distal pulses.   No murmur heard. Pulses:      Radial pulses are 2+ on the right side, and 2+ on the left side.  Pulmonary/Chest: Effort normal and breath sounds normal. No respiratory distress. She has no wheezes. She has no rales.  Abdominal: Soft. Bowel sounds are normal. She exhibits no distension and  no mass. There is no tenderness. There is no rebound and no guarding.  Musculoskeletal: Normal range of motion. She exhibits no edema.  Lymphadenopathy:    She has no cervical adenopathy.  Neurological: She is alert and oriented to person, place, and time.  CN grossly intact, station and gait intact  Skin: Skin is warm and dry. No rash noted. There is pallor.  Normal skin turgor  Psychiatric: Her speech is normal and behavior is normal. Judgment and thought content normal. Cognition and memory are normal.  Somewhat flattened affect Somewhat masked fascies       Assessment & Plan:  I have asked her to return in 1 month for medicare wellness visit.

## 2012-11-05 NOTE — Assessment & Plan Note (Signed)
Will reassess next visit once acute diarrheal illness has resolved.

## 2012-11-05 NOTE — Assessment & Plan Note (Signed)
Sounds like depression with psychosis.  Continue f/u with Psych.  No changes today.

## 2012-11-05 NOTE — Assessment & Plan Note (Signed)
Check TSH today

## 2012-11-06 ENCOUNTER — Telehealth: Payer: Self-pay

## 2012-11-06 ENCOUNTER — Encounter: Payer: Self-pay | Admitting: *Deleted

## 2012-11-06 NOTE — Telephone Encounter (Signed)
Pt saw Dr Reece Agar on 11/05/12 and pt was to bring urine specimen back to our office; pt said she is dribbling(this is normal for pt) and cannot get a specimen. Pt said she is increasing her juice to try to bring specimen to office on Mon. Pt said not having a problem urinating except has perineal itching and the usual dribbling of urine. Is this OK?

## 2012-11-06 NOTE — Telephone Encounter (Signed)
I think this is reasonable and appears to be the only option through [place] for now.

## 2012-11-09 DIAGNOSIS — R82998 Other abnormal findings in urine: Secondary | ICD-10-CM | POA: Diagnosis not present

## 2012-11-09 LAB — POCT URINALYSIS DIPSTICK
Bilirubin, UA: NEGATIVE
Glucose, UA: NEGATIVE
Ketones, UA: NEGATIVE
Nitrite, UA: NEGATIVE

## 2012-11-09 NOTE — Addendum Note (Signed)
Addended by: Josph Macho A on: 11/09/2012 08:52 AM   Modules accepted: Orders

## 2012-11-10 LAB — URINALYSIS, MICROSCOPIC ONLY
Casts: NONE SEEN
Squamous Epithelial / LPF: NONE SEEN

## 2012-11-11 ENCOUNTER — Ambulatory Visit (HOSPITAL_COMMUNITY): Payer: Self-pay | Admitting: Psychology

## 2012-11-12 ENCOUNTER — Ambulatory Visit (INDEPENDENT_AMBULATORY_CARE_PROVIDER_SITE_OTHER): Payer: Medicare Other | Admitting: Psychology

## 2012-11-12 DIAGNOSIS — F489 Nonpsychotic mental disorder, unspecified: Secondary | ICD-10-CM

## 2012-11-12 DIAGNOSIS — F29 Unspecified psychosis not due to a substance or known physiological condition: Secondary | ICD-10-CM

## 2012-11-12 DIAGNOSIS — F341 Dysthymic disorder: Secondary | ICD-10-CM | POA: Diagnosis not present

## 2012-11-12 DIAGNOSIS — F418 Other specified anxiety disorders: Secondary | ICD-10-CM

## 2012-12-03 ENCOUNTER — Ambulatory Visit (HOSPITAL_COMMUNITY): Payer: Self-pay | Admitting: Psychology

## 2012-12-04 ENCOUNTER — Ambulatory Visit (HOSPITAL_COMMUNITY): Payer: Self-pay | Admitting: Psychiatry

## 2012-12-04 ENCOUNTER — Telehealth: Payer: Self-pay | Admitting: Family Medicine

## 2012-12-04 NOTE — Telephone Encounter (Signed)
Just getting this message now. Please call on Monday for an update on patient's condition.

## 2012-12-04 NOTE — Telephone Encounter (Signed)
[facility] Triage Call Report Triage Record Num: [id number] Operator: Alphonsa Overall Patient Name: Jamie Pitts Call Date & Time: 12/03/2012 8:41:37PM Patient Phone: [phone] PCP: Eustaquio Boyden Patient Gender: Female PCP Fax : [phone] Patient DOB: January 05, 1934 Practice Name: [facility] Reason for Call: Caller: [name]. PCP: Eustaquio Boyden (Family Practice); CB#: [phone]; Call regarding No control over bowels/bladder. Ongoing x months. Loosing weight x months. Last office visit 9/14. Drinking little, eating little, urinating. Pt confused. [name] concerned and wants pt seen tonight. ED offered, but pt MD wouldn't be there. [name] frustrated and will take pt for evaluation 12/02/12 to [facility]. Unable to triage symptoms. Protocol(s) Used: Office Note Recommended Outcome per Protocol: Information Noted and Sent to Office Reason for Outcome: Caller information to office Care Advice: ~ 12/03/2012 8:57:48PM Page 1 of 1 [id number]

## 2012-12-07 ENCOUNTER — Ambulatory Visit: Payer: Self-pay | Admitting: Family Medicine

## 2012-12-07 NOTE — Telephone Encounter (Signed)
Spoke with granddaughter. Patient had appt with Dr. Para March today but cancelled it. She refuses to be checked out. Granddaughter says pt ate a sandwich today and diarrhea has stopped (per pt) but her hemorrhoids are very bad. She said pt is still confused and refusing any type of care. I advised that if she is confused to the point that she can't make sound decisions for herself, then EMS needs to be called and they have to take pt to the hospital. They won't be able to leave her if she is not oriented. Granddaughter said she would call them if diarrhea returns.

## 2012-12-08 ENCOUNTER — Other Ambulatory Visit (HOSPITAL_COMMUNITY): Payer: Self-pay | Admitting: Psychiatry

## 2012-12-22 ENCOUNTER — Ambulatory Visit (INDEPENDENT_AMBULATORY_CARE_PROVIDER_SITE_OTHER): Payer: Medicare Other | Admitting: Psychology

## 2012-12-22 ENCOUNTER — Encounter (HOSPITAL_COMMUNITY): Payer: Self-pay | Admitting: Psychology

## 2012-12-22 DIAGNOSIS — F489 Nonpsychotic mental disorder, unspecified: Secondary | ICD-10-CM

## 2012-12-22 DIAGNOSIS — F29 Unspecified psychosis not due to a substance or known physiological condition: Secondary | ICD-10-CM

## 2012-12-22 DIAGNOSIS — F341 Dysthymic disorder: Secondary | ICD-10-CM

## 2012-12-22 DIAGNOSIS — F418 Other specified anxiety disorders: Secondary | ICD-10-CM

## 2012-12-22 NOTE — Progress Notes (Signed)
GENERAL INTELLECTUAL FUNCTIONING:  The patient was initially administered the CERAD Neuropsychological Battery.  Below are the patient's scores in relationship to normative data and mild/moderate Senile Dementia of the Alzheimer's Type.      VF Name MMSE [facility] Mrs. Stuckert: 11 14 22 11 12 5 9   Normal Age Matched 20 14.6 28.9 10.1 21.1 7.2 9.6  Mild SDAT:  8.8 11.8 20 7.8 8.8 0.9 4.8   Moderate SDAT 6.8 10.5 16.1 6.5 6.3 0.4 3.9   On the CERAD battery, which is a battery of measures shown to be sensitive to various forms of dementia, the patient's performance was generally efficient and not consistent with those found even the mild Alzheimer's population group. She did, however, shows some disturbance of her mental status exam and had some mild problems with memory was able to store information and have it available for later recall.  Behavioral observations indicate that she fully participate in the testing and appeared he tried her hardest. Her performance on the Mini-Mental State Exam indicates that she was oriented to the year and season but was significantly disoriented to date. She did correctly identify the day of week and month as well as where she was located over the testing. However, she thought that we were in [facility] and that she was in [place] rather than [employer]. She was  able to immediately recall 3 objects but was unable to recall more than one of those objects after a brief period of delay. She was able to spell world forwards and only had one reversal of letters when trying to spell "world" backwards.  However, during this attempt she got to the very end of the word spelling it backwards and reported that she forgot what words she was spelling and what prompted was able to do this task correctly.  There were no significant aphasic or apraxic difficulties noted.   She was able to carry out a brief series of verbal instructions.  Her performance  on the constructional praxis test, a test sensitive to cortically based neurological deficits, was  efficient without any significant indications of impairment for planning, visual spatial abilities, and fine motor coordination.  The patient's performance on the Verbal Fluency Test was  mildly impaired but still above those of the mild Alzheimer's population group.  Her ability to name pictures of common and uncommon objects as measured by the [facility] 2 Naming Test was  within normal limits with no indication of visual-spatial disturbance or an inability to name common items or any indication of visual neglect.   The patient's verbal learning and ability to encode new information was  mildly impaired when compared to an age matched control group. However, she did perform better than individuals in the Alzheimer's population. For example she recalled 12 of the possible 30 words during the initial wordless memory tasks. This is approximately half the number that the normative group recalls but more than a mild Alzheimer's population group Yeary at home and word list recall which is a free recall format she recalled 5 of the 10 words which while below the normative control group is significantly above the mild Alzheimer's population group. When this word list challenges changed to a recognition format she recalled 9 of the possible 10 words correctly and had only one admission. Clearly, she is having some mild to moderate memory problems but her pattern at this time is significantly different than those typically seen in even a mild Alzheimer's  population.   Overall, this performance indicates that the patient  is having some measurable cognitive difficulties. Indications of verbal fluency deficits as well as some memory deficits are noted but they are not significant relative to even a mild Alzheimer's population. The patient's biggest deficit was with regard to verbal fluency but she was able to learn and  remember information after a period of delay. There was some confusion as to the element of time on her mental status exam but no other significant disturbance. She did have times where she was distracted and confused but when she was able to maintain her attention and concentration her memory improved significantly.  Summery and Conclusions:  The patient's performance is not consistent with those typically and even the mild Alzheimer's population. It does appear that there are more frontal lobe impairments particularly related to problems with attention and concentration, distractibility, verbal fluency, and some mental status aspects. Insomnia may be playing a role in some of these cognitive difficulties particularly related to attention concentration or there may also be some very early signs of the onset of cognitive changes. The development of her auditory hallucinations are consistent with some significant disturbance in sleep and underlying changes. Therefore, I do think that we will need to conduct repeat testing in 6-9 months to see if there are any progressive changes noted.

## 2012-12-22 NOTE — Progress Notes (Signed)
Patient:   Jamie Pitts   DOB:   [birth year]  MR Number:  [id number]  Location:  [facility] [address] [place] [address] Dept: [phone]           Date of Service:   11/12/2012  Start Time:   2 PM End Time:   3 PM  Provider/Observer:  Hershal Coria PSYD       Billing Code/Service: [phone]  Chief Complaint:     Chief Complaint  Patient presents with  . Hallucinations  . Memory Loss    Reason for Service:  The patient was referred by Dr. Prudencio Pair as well as Dr. walker here at our office who is seen her for depression and auditory hallucinations. The patient is a 79 year old Caucasian female who initially was seen at the [facility] because of anxiety auditory, hallucinations and depressive symptoms. The patient is had a number of stressors with her family including difficulties with both of her grandsons and her daughter been overly stressed and the whole situation causing financial difficulties. The patient reports that she started hearing music which is in progress and hearing voices. She reports that this started in November of last year and was present in November, December, and January. The patient reports that this was very stressful to her that she lost 30 pounds. The patient was seen at the behavioral unit and followed up with our office. The patient reports that she did not feel depressed at the time but did cry a lot. She reports that the crying has stopped. The patient denied any geographic disorientation but did experience some word finding issues. She also describes significant insomnia and has been taking various medications for sleep.   Current Status:   the patient describes moderate to significant issues related to depression, anxiety, appetite changes, worry and confusion, loss of interest, agitation, and some fear and panic responses. The patient reports  that her legs are weak and hurting unless she walks around and that she has significant difficulty sleeping and cannot relax during the day.  Reliability of Information:  the information provided by both the patient as well as her review of available medical records.  Behavioral Observation: Jamie Pitts  presents as a 79 y.o.-year-old Right Caucasian Female who appeared her stated age. her dress was Appropriate and she was Well Groomed and her manners were Appropriate to the situation.  There were not any physical disabilities noted.  she displayed an appropriate level of cooperation and motivation.    Interactions:    Active   Attention:   Distracted by some internal preoccupations.  Memory:   The patient had some significant difficulties with memory for immediate events and had some disturbance in her orientation as to time.  Visuo-spatial:   within normal limits  Speech (Volume):  normal  Speech:   normal pitch  Thought Process:  Coherent  Though Content:  WNL  Orientation:   person, place, situation and The patient had some disturbance as to the date.  Judgment:   Good  Planning:   Good  Affect:    Anxious and Depressed  Mood:    Anxious and Depressed  Insight:   Fair  Intelligence:   normal  Marital Status/Living:  the patient was born and raised in [place]. She grew up in a family of 12 children and reports that the family was happy in that she had a very good mother  father. Her parents are deceased but lived into their 27s.  Current Employment:  the patient is not working and is retired.  Substance Use:  No concerns of substance abuse are reported.   the patient denies any substance abuse and has not had a drink for least 40 years.  Education:   The patient graduated from high school and had one year of college education.  Medical History:   Past Medical History  Diagnosis Date  . GERD (gastroesophageal reflux disease)     intermittent, treated  with tums  . Hypothyroidism   . Scoliosis   . Hiatal hernia   . Adenomatous polyp     x1  . Lumbago   . Depression   . Diverticulitis   . Personal history of kidney stones     x1  . ALLERGIC RHINITIS 12/20/2006    Qualifier: Diagnosis of  By: Jarold Motto NP, Verne Grain         Outpatient Encounter Prescriptions as of 11/12/2012  Medication Sig  . benztropine (COGENTIN) 1 MG tablet Take 1 tablet (1 mg total) by mouth 2 (two) times daily.  Marland Kitchen dicyclomine (BENTYL) 10 MG capsule Take 10 mg by mouth daily as needed. For diverticulitis  . lamoTRIgine (LAMICTAL) 25 MG tablet Take 2 tablets (50 mg total) by mouth 2 (two) times daily.  Marland Kitchen levothyroxine (SYNTHROID, LEVOTHROID) 88 MCG tablet Take 88 mcg by mouth every morning.   . risperiDONE (RISPERDAL) 1 MG tablet Take 1 tablet (1 mg total) by mouth 2 (two) times daily.  Marland Kitchen venlafaxine XR (EFFEXOR-XR) 37.5 MG 24 hr capsule Take 1 capsule (37.5 mg total) by mouth 2 (two) times daily.  . vitamin B-12 (CYANOCOBALAMIN) 1000 MCG tablet Take 1,000 mcg by mouth daily.  . Vitamin D, Ergocalciferol, (DRISDOL) 50000 UNITS CAPS capsule Take 50,000 Units by mouth every 7 (seven) days.          Sexual History:   History  Sexual Activity  . Sexual Activity: Not on file    Abuse/Trauma History:  the patient denies any history of abuse/trauma.  Psychiatric History:   the patient denies any history of psychiatric issues going prior to these more recent events. In November of 2013 that she began hearing music and then hearing voices and this was significantly prominent for about 3 months. She lost a significant amount of weight. It was also during a time when she was under a great deal of stress and family difficulties with significant insomnia.  Family Med/Psych History:  Family History  Problem Relation Age of Onset  . Schizophrenia Sister   . Alcohol abuse Neg Hx   . Bipolar disorder Neg Hx   . Dementia Neg Hx   . Drug abuse Neg Hx   . OCD Neg Hx   .  Paranoid behavior Neg Hx   . Seizures Neg Hx   . Sexual abuse Neg Hx   . Physical abuse Neg Hx   . Anxiety disorder Grandchild   . Depression Grandchild   . Cancer Sister     lung  . Healthy Sister   . Diabetes Mother   . Diabetes Father   . Diabetes Sister   . Diabetes Brother   . Cancer Sister     kidney    Risk of Suicide/Violence: virtually non-existent   Impression/DX:   the patient does have a significant prior history of depression or anxiety clearly reports a great deal of stress at the time of the symptoms developing. She was  experiencing significant insomnia prior to the development of her hallucinations. The patient has had some memory problems that she and sisters related to medications and/or insomnia and stress. However, she acknowledges some word finding difficulties.  Disposition/Plan:   we will formally assessed this broad range of neuropsychological measures to assess if there are indications of the type of dressings and she going on this is related to significant insomnia and the development of depression.  Diagnosis:    Axis I:  Psychosis  Dysthymic disorder  Insomnia secondary to depression with anxiety

## 2012-12-30 ENCOUNTER — Encounter: Payer: Self-pay | Admitting: Endocrinology

## 2012-12-30 ENCOUNTER — Ambulatory Visit (INDEPENDENT_AMBULATORY_CARE_PROVIDER_SITE_OTHER): Payer: Medicare Other | Admitting: Endocrinology

## 2012-12-30 VITALS — BP 118/76 | HR 79 | Temp 98.6°F | Resp 12 | Ht 65.0 in | Wt 142.7 lb

## 2012-12-30 DIAGNOSIS — Z23 Encounter for immunization: Secondary | ICD-10-CM

## 2012-12-30 DIAGNOSIS — E039 Hypothyroidism, unspecified: Secondary | ICD-10-CM | POA: Diagnosis not present

## 2012-12-30 DIAGNOSIS — M810 Age-related osteoporosis without current pathological fracture: Secondary | ICD-10-CM

## 2012-12-30 MED ORDER — LEVOTHYROXINE SODIUM 88 MCG PO TABS: 88.0000 ug | ORAL_TABLET | Freq: Every morning | ORAL | Status: DC

## 2012-12-30 MED ORDER — VITAMIN D (ERGOCALCIFEROL) 1.25 MG (50000 UNIT) PO CAPS
50000.0000 [IU] | ORAL_CAPSULE | ORAL | Status: DC
Start: 2012-12-30 — End: 2013-12-27

## 2012-12-30 NOTE — Progress Notes (Signed)
Patient ID: Jamie Pitts, female   DOB: Jul 17, 1933, 79 y.o.   MRN: [id number]  Reason for Appointment:  Hypothyroidism, followup visit    History of Present Illness:   The hypothyroidism was first diagnosed in 1980 This was diagnosed when she had symptoms of fatigue and hair loss No history of goiter Has been fairly compliant with followup for monitoring her dosage  Complaints are reported by the patient now are none and has no unusual fatigue. She does have chronic cold sensitivity          The treatments that the patient has taken include Synthroid and has been on 88 mcg for quite sometime.          Compliance with the medical regimen has been as prescribed with taking the tablet in the morning before breakfast.  OSTEOPOROSIS: She has had asymptomatic osteoporosis as of 2007. At that time she was treated with bisphosphonates for unknown duration of time Was taking Fosamax in 2006 and Actonel in 2007, not clear how long she continued this She had a T score of -2.8 in 2007 and unable to open the report for 2013, not clear if this was done Currently does not have any height loss or back pain   No visits with results within 1 Week(s) from this visit. Latest known visit with results is:  Office Visit on 11/05/2012  Component Date Value Range Status  . WBC 11/05/2012 7.7  4.5 - 10.5 K/uL Final  . RBC 11/05/2012 4.51  3.87 - 5.11 Mil/uL Final  . Hemoglobin 11/05/2012 12.6  12.0 - 15.0 g/dL Final  . HCT 40/98/1191 38.0  36.0 - 46.0 % Final  . MCV 11/05/2012 84.4  78.0 - 100.0 fl Final  . MCHC 11/05/2012 33.2  30.0 - 36.0 g/dL Final  . RDW 47/82/9562 14.1  11.5 - 14.6 % Final  . Platelets 11/05/2012 372.0  150.0 - 400.0 K/uL Final  . Neutrophils Relative % 11/05/2012 73.7  43.0 - 77.0 % Final  . Lymphocytes Relative 11/05/2012 17.0  12.0 - 46.0 % Final  . Monocytes Relative 11/05/2012 7.9  3.0 - 12.0 % Final  . Eosinophils Relative 11/05/2012 0.8  0.0 - 5.0 % Final  . Basophils  Relative 11/05/2012 0.6  0.0 - 3.0 % Final  . Neutro Abs 11/05/2012 5.7  1.4 - 7.7 K/uL Final  . Lymphs Abs 11/05/2012 1.3  0.7 - 4.0 K/uL Final  . Monocytes Absolute 11/05/2012 0.6  0.1 - 1.0 K/uL Final  . Eosinophils Absolute 11/05/2012 0.1  0.0 - 0.7 K/uL Final  . Basophils Absolute 11/05/2012 0.0  0.0 - 0.1 K/uL Final  . TSH 11/05/2012 2.58  0.35 - 5.50 uIU/mL Final  . Sodium 11/05/2012 135  135 - 145 mEq/L Final  . Potassium 11/05/2012 4.3  3.5 - 5.1 mEq/L Final  . Chloride 11/05/2012 104  96 - 112 mEq/L Final  . CO2 11/05/2012 25  19 - 32 mEq/L Final  . Glucose, Bld 11/05/2012 84  70 - 99 mg/dL Final  . BUN 13/09/6576 11  6 - 23 mg/dL Final  . Creatinine, Ser 11/05/2012 1.0  0.4 - 1.2 mg/dL Final  . Total Bilirubin 11/05/2012 0.5  0.3 - 1.2 mg/dL Final  . Alkaline Phosphatase 11/05/2012 82  39 - 117 U/L Final  . AST 11/05/2012 9  0 - 37 U/L Final  . ALT 11/05/2012 6  0 - 35 U/L Final  . Total Protein 11/05/2012 6.4  6.0 - 8.3 g/dL Final  .  Albumin 11/05/2012 2.9* 3.5 - 5.2 g/dL Final  . Calcium 16/11/9602 8.8  8.4 - 10.5 mg/dL Final  . GFR 54/10/8117 59.18* >60.00 mL/min Final  . Color, UA 11/09/2012 Yellow   Final  . Clarity, UA 11/09/2012 Cloudy   Final  . Glucose, UA 11/09/2012 Negative   Final  . Bilirubin, UA 11/09/2012 Negative   Final  . Ketones, UA 11/09/2012 Negative   Final  . Spec Grav, UA 11/09/2012 1.015   Final  . Blood, UA 11/09/2012 Negative   Final  . pH, UA 11/09/2012 6.5   Final  . Protein, UA 11/09/2012 Negative   Final  . Urobilinogen, UA 11/09/2012 0.2   Final  . Nitrite, UA 11/09/2012 Negative   Final  . Leukocytes, UA 11/09/2012 Trace   Final  . Squamous Epithelial / LPF 11/09/2012 NONE SEEN  RARE Final  . Crystals 11/09/2012 Calcium Oxalate crystals noted  NONE SEEN Final  . Casts 11/09/2012 NONE SEEN  NONE SEEN Final  . WBC, UA 11/09/2012 0-2  <3 WBC/hpf Final  . RBC / HPF 11/09/2012 0-2  <3 RBC/hpf Final  . Bacteria, UA 11/09/2012 FEW* RARE  Final      Medication List       This list is accurate as of: 12/30/12  8:04 AM.  Always use your most recent med list.               benztropine 1 MG tablet  Commonly known as:  COGENTIN  Take 1 tablet (1 mg total) by mouth 2 (two) times daily.     dicyclomine 10 MG capsule  Commonly known as:  BENTYL  Take 10 mg by mouth daily as needed. For diverticulitis     lamoTRIgine 25 MG tablet  Commonly known as:  LAMICTAL  Take 2 tablets (50 mg total) by mouth 2 (two) times daily.     levothyroxine 88 MCG tablet  Commonly known as:  SYNTHROID, LEVOTHROID  Take 88 mcg by mouth every morning.     risperiDONE 1 MG tablet  Commonly known as:  RISPERDAL  Take 1 tablet (1 mg total) by mouth 2 (two) times daily.     venlafaxine XR 37.5 MG 24 hr capsule  Commonly known as:  EFFEXOR-XR  Take 1 capsule (37.5 mg total) by mouth 2 (two) times daily.     vitamin B-12 1000 MCG tablet  Commonly known as:  CYANOCOBALAMIN  Take 1,000 mcg by mouth daily.     Vitamin D (Ergocalciferol) 50000 UNITS Caps capsule  Commonly known as:  DRISDOL  Take 50,000 Units by mouth every 7 (seven) days.        Allergies:  Allergies  Allergen Reactions  . Amoxicillin Hives    Sores in mouth  . Sulfonamide Derivatives Hives    Sores in mouth    Past Medical History  Diagnosis Date  . GERD (gastroesophageal reflux disease)     intermittent, treated with tums  . Hypothyroidism   . Scoliosis   . Hiatal hernia   . Adenomatous polyp     x1  . Lumbago   . Depression   . Diverticulitis   . Personal history of kidney stones     x1  . ALLERGIC RHINITIS 12/20/2006    Qualifier: Diagnosis of  By: Jarold Motto NP, Verne Grain     Past Surgical History  Procedure Laterality Date  . Appendectomy  1954  . Cholecystectomy    . Abdominal hysterectomy  1979    ectopic  pregnancy with one ovary removed  . Tonsillectomy    . Oophorectomy Right 1965    Ectopic pregnancy, removal of right ovary and tube-  1960  . Esophagogastroduodenoscopy    . Rotator cuff repair Right   . Knee surgery      Family History  Problem Relation Age of Onset  . Schizophrenia Sister   . Alcohol abuse Neg Hx   . Bipolar disorder Neg Hx   . Dementia Neg Hx   . Drug abuse Neg Hx   . OCD Neg Hx   . Paranoid behavior Neg Hx   . Seizures Neg Hx   . Sexual abuse Neg Hx   . Physical abuse Neg Hx   . Anxiety disorder Grandchild   . Depression Grandchild   . Cancer Sister     lung  . Healthy Sister   . Diabetes Mother   . Diabetes Father   . Diabetes Sister   . Diabetes Brother   . Cancer Sister     kidney    Social History:  reports that she has never smoked. She has never used smokeless tobacco. She reports that she does not drink alcohol or use illicit drugs.  REVIEW Of SYSTEMS:   Examination:   BP 118/76  Pulse 79  Temp(Src) 98.6 F (37 C)  Resp 12  Ht 5\' 5"  (1.651 m)  Wt 142 lb 11.2 oz (64.728 kg)  BMI 23.75 kg/m2  SpO2 94%  LMP 07/15/2012   GENERAL APPEARANCE: Alert And looks well.    FACE: No puffiness of face, hands or ankles        NECK: no thyromegaly.          NEUROLOGIC EXAM:  biceps reflexes show normal relaxation    Assessments   Hypothyroidism, primary and without goiter She has had a very long history of hypothyroidism and is currently fairly stable with her regimen of 88 mcg Since her labs were normal in 9/14 from PCP will not repeat them today   OSTEOPOROSIS: Bone density report from 2013 is not available. She had a T score of -2.8 in 2007 and will need repeat bone density to consider treatment if any worse Currently does not have any height loss   Treatment:   Continue same dosage before breakfast daily. Avoid taking any calcium or iron supplements with the thyroid supplement.   Followup in 6 months  Jamie Pitts 12/30/2012, 8:04 AM

## 2013-01-04 ENCOUNTER — Ambulatory Visit: Payer: Self-pay | Admitting: Endocrinology

## 2013-01-07 ENCOUNTER — Encounter (HOSPITAL_COMMUNITY): Payer: Self-pay | Admitting: Psychology

## 2013-01-07 ENCOUNTER — Ambulatory Visit (INDEPENDENT_AMBULATORY_CARE_PROVIDER_SITE_OTHER): Payer: Medicare Other | Admitting: Psychology

## 2013-01-07 DIAGNOSIS — F418 Other specified anxiety disorders: Secondary | ICD-10-CM

## 2013-01-07 DIAGNOSIS — F489 Nonpsychotic mental disorder, unspecified: Secondary | ICD-10-CM

## 2013-01-07 DIAGNOSIS — F29 Unspecified psychosis not due to a substance or known physiological condition: Secondary | ICD-10-CM

## 2013-01-07 DIAGNOSIS — F341 Dysthymic disorder: Secondary | ICD-10-CM

## 2013-01-07 NOTE — Progress Notes (Signed)
Today I provided feedback of her neuropsychological testing done a few weeks ago.  Below is the Summary from that evaluation.  The plan is for the patient to return in about 8 months for repeat testing to look for any progressive changes.    Summery and Conclusions:  The patient's performance is not consistent with those typically and even the mild Alzheimer's population. It does appear that there are more frontal lobe impairments particularly related to problems with attention and concentration, distractibility, verbal fluency, and some mental status aspects. Insomnia may be playing a role in some of these cognitive difficulties particularly related to attention concentration or there may also be some very early signs of the onset of cognitive changes. The development of her auditory hallucinations are consistent with some significant disturbance in sleep and underlying changes. Therefore, I do think that we will need to conduct repeat testing in 6-9 months to see if there are any progressive changes noted.

## 2013-02-01 ENCOUNTER — Ambulatory Visit (INDEPENDENT_AMBULATORY_CARE_PROVIDER_SITE_OTHER): Payer: Medicare Other | Admitting: Psychiatry

## 2013-02-01 ENCOUNTER — Encounter (HOSPITAL_COMMUNITY): Payer: Self-pay | Admitting: Psychiatry

## 2013-02-01 VITALS — BP 110/68 | Ht 65.0 in | Wt 141.0 lb

## 2013-02-01 DIAGNOSIS — F329 Major depressive disorder, single episode, unspecified: Secondary | ICD-10-CM

## 2013-02-01 DIAGNOSIS — F341 Dysthymic disorder: Secondary | ICD-10-CM

## 2013-02-01 DIAGNOSIS — F29 Unspecified psychosis not due to a substance or known physiological condition: Secondary | ICD-10-CM

## 2013-02-01 MED ORDER — RISPERIDONE 1 MG PO TABS: 1.0000 mg | ORAL_TABLET | Freq: Two times a day (BID) | ORAL | Status: DC

## 2013-02-01 MED ORDER — LAMOTRIGINE 25 MG PO TABS: 50.0000 mg | ORAL_TABLET | Freq: Two times a day (BID) | ORAL | Status: DC

## 2013-02-01 MED ORDER — VENLAFAXINE HCL ER 37.5 MG PO CP24: 37.5000 mg | ORAL_CAPSULE | Freq: Two times a day (BID) | ORAL | Status: DC

## 2013-02-01 MED ORDER — BENZTROPINE MESYLATE 1 MG PO TABS: 1.0000 mg | ORAL_TABLET | Freq: Two times a day (BID) | ORAL | Status: DC

## 2013-02-01 NOTE — Progress Notes (Signed)
Patient ID: Ceyda Peterka, female   DOB: [birth year], 79 y.o.   MRN: [id number] Patient ID: Adaleigh Warf, female   DOB: 1933/10/03, 79 y.o.   MRN: [id number] [facility] [id number] Progress Note Maitri Schnoebelen MRN: [id number] DOB: [birth year] Age: 79 y.o.  Date: 02/01/2013 Start Time: 10:20 AM End Time: 10:50 AM  Chief Complaint: Chief Complaint  Patient presents with  . Anxiety  . Hallucinations  . Follow-up   Subjective: "I'm still hearing voices."   This patient is a 79 year old married white female who lives with her husband in [place]. She has one daughter and 3 grandchildren. She is retired from the [employer].  Apparently the patient had some sort of psychotic episode in 2013. She was admitted to [facility] because she was hearing voices at home. The voices got so bad that she cannot put a gun under the bed and wanted to shoot him. The patient is a poor historian and is hard of hearing and gets easily confused. She was placed on various medicines in the hospital which have been continued. It's unclear if she is medication compliant. For example she's not taking the Cogentin. She repeats herself a lot.  The patient states she still hearing voices every day. They're not frightening her like they were before but there seemed to her and she can't always get them to stop. She sleeping pretty well she denies being depressed but she is obviously not thinking clearly. She mentions that she's been diagnosed with parkinsonism but she doesn't have a tremor.  The patient returns after 2 months. She states that she fell down about 2 weeks ago and strain the inside of her left thigh and is still hurting. She has not called her primary doctor. She is afraid to increase the Risperdal to 2 mg per day. Therefore she still hearing voices that seem like a conversation. She promises me today that she will increase the Risperdal. Overall she is functioning fairly well and getting  out with her grandchildren. Her mood is been fairly stable.    History of Chief Complaint:   At age 47 pt went to a "fat farm" to fatten her up.  She remembers her father paid more attention to her than her mother did.  She experienced some anxiety when she moved overseas.  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  She noted feeling very home sick to go back to [place] her home place in 2010.  Her sister had been very nervous and she was beginning to feel anxious and was started on Klonopin.  ISince then she noted songs for 3 or 4 years.  Then in October of 2013, she started hearing voices non stop and she lost 30 pounds. This prompted her to get a gun and put it under the bed and the family got concerned.  She was admitted to [facility] on January 24th.  She was stopped from the Klonopin and Lithium (pt doesn't understand how she got on that) and placed on Ripserdal, Trazodone, Lamictal, and Effexor with pretty good results, except she has very dry mouth at nigh  She has noted blurred vision since starting the meds.  She is getting new glasses for herself.  Anxiety Symptoms include confusion, decreased concentration, dry mouth and nervous/anxious behavior. Patient reports no dizziness or suicidal ideas.  Review of Systems  HENT: Negative.   Gastrointestinal: Negative.   Genitourinary: Negative.   Neurological: Positive for weakness and light-headedness. Negative for dizziness, tremors, seizures, syncope, facial asymmetry, speech difficulty, numbness and headaches.       Lightheaded if gets up too fast.  Psychiatric/Behavioral: Positive for confusion, dysphoric mood and decreased concentration. Negative for suicidal ideas, hallucinations, behavioral problems, sleep disturbance, self-injury and agitation. The patient is nervous/anxious. The patient is not hyperactive.     Physical Exam  Depressive Symptoms: none now  (Hypo) Manic Symptoms:   None  Anxiety Symptoms: Excessive Worry:  Yes Panic Symptoms:  No Agoraphobia:  No Obsessive Compulsive: Yes  Symptoms: ordliness Specific Phobias:  Yes Social Anxiety:  No  Psychotic Symptoms:  None  PTSD Symptoms: Ever had a traumatic exposure:  Yes Had a traumatic exposure in the last month:  No No other features  Traumatic Brain Injury: No   Past Psychiatric History: Diagnosis: Depression  Hospitalizations: once [facility]  Outpatient Care: PCP  Substance Abuse Care: none  Self-Mutilation: none  Suicidal Attempts: none  Violent Behaviors: one episode of getting a gun, but now sees that that is not the way to handle her voices    Allergies: Allergies  Allergen Reactions  . Amoxicillin Hives    Sores in mouth  . Sulfonamide Derivatives Hives    Sores in mouth   Past Medical History:   Past Medical History  Diagnosis Date  . GERD (gastroesophageal reflux disease)     intermittent, treated with tums  . Hypothyroidism   . Scoliosis   . Hiatal hernia   . Adenomatous polyp     x1  . Lumbago   . Depression   . Diverticulitis   . Personal history of kidney stones     x1  . ALLERGIC RHINITIS 12/20/2006   Surgical History: Past Surgical History  Procedure Laterality Date  . Appendectomy  1954  . Cholecystectomy    . Abdominal hysterectomy  1979    ectopic pregnancy with one ovary removed  . Tonsillectomy    . Oophorectomy Right 1965    Ectopic pregnancy, removal of right ovary and tube- 1960  . Esophagogastroduodenoscopy    . Rotator cuff repair Right   . Knee surgery     History of Loss of Consciousness:  No Seizure History:  No Cardiac History:  No Allergies: Allergies  Allergen Reactions  . Amoxicillin Hives    Sores in mouth  . Sulfonamide Derivatives Hives    Sores in mouth   Medical History: Past Medical History  Diagnosis Date  . GERD (gastroesophageal reflux  disease)     intermittent, treated with tums  . Hypothyroidism   . Scoliosis   . Hiatal hernia   . Adenomatous polyp     x1  . Lumbago   . Depression   . Diverticulitis   . Personal history of kidney stones     x1  . ALLERGIC RHINITIS 12/20/2006   Surgical History: Past Surgical History  Procedure Laterality Date  . Appendectomy  1954  . Cholecystectomy    . Abdominal hysterectomy  1979    ectopic pregnancy with one ovary removed  . Tonsillectomy    . Oophorectomy Right 1965    Ectopic pregnancy, removal of right ovary and tube- 1960  . Esophagogastroduodenoscopy    . Rotator cuff repair Right   . Knee surgery     Family History: family history includes Anxiety disorder in her grandchild; Cancer in her sister  and sister; Depression in her grandchild; Diabetes in her brother, father, mother, and sister; Healthy in her sister; Schizophrenia in her sister. There is no history of Alcohol abuse, Bipolar disorder, Dementia, Drug abuse, OCD, Paranoid behavior, Seizures, Sexual abuse, or Physical abuse. Reviewed and no changes noted.  Current Medications:  Current Outpatient Prescriptions  Medication Sig Dispense Refill  . benztropine (COGENTIN) 1 MG tablet Take 1 tablet (1 mg total) by mouth 2 (two) times daily.  60 tablet  2  . dicyclomine (BENTYL) 10 MG capsule Take 10 mg by mouth daily as needed. For diverticulitis      . lamoTRIgine (LAMICTAL) 25 MG tablet Take 2 tablets (50 mg total) by mouth 2 (two) times daily.  120 tablet  2  . levothyroxine (SYNTHROID, LEVOTHROID) 88 MCG tablet Take 1 tablet (88 mcg total) by mouth every morning.  90 tablet  3  . risperiDONE (RISPERDAL) 1 MG tablet Take 1 tablet (1 mg total) by mouth 2 (two) times daily.  60 tablet  2  . venlafaxine XR (EFFEXOR-XR) 37.5 MG 24 hr capsule Take 1 capsule (37.5 mg total) by mouth 2 (two) times daily.  60 capsule  2  . vitamin B-12 (CYANOCOBALAMIN) 1000 MCG tablet Take 1,000 mcg by mouth daily.      . Vitamin D,  Ergocalciferol, (DRISDOL) 50000 UNITS CAPS capsule Take 1 capsule (50,000 Units total) by mouth every 7 (seven) days.  12 capsule  3   No current facility-administered medications for this visit.    Previous Psychotropic Medications:  Medication Dose   Klonopin     Lithium ?    Risperdal    Lamictal    Effexor    Trazodone    Substance Abuse History in the last 12 months: Substance Age of 1st Use Last Use Amount Specific Type  Nicotine  none        Alcohol  20  March 13      Cannabis  none        Opiates  none        Cocaine  none        Methamphetamines  none        LSD  none        Ecstasy  none         Benzodiazepines  [age]      Caffeine  childhood  this AM      Inhalants  none        Others:       sugar  childhood  this AM     Medical Consequences of Substance Abuse: none  Legal Consequences of Substance Abuse: none  Family Consequences of Substance Abuse: none  Social History: Current Place of Residence: [address] [place] [address] Place of Birth: [place], [place] Family Members: husband Marital Status:  Married Children: 1  Sons: 0  Daughters: 1 Relationships: husbandf Education:  Corporate treasurer Problems/Performance: good Religious Beliefs/Practices: [employer] History of Abuse: none Occupational Experiences: clerical, office, Scientist, forensic History:  Electronics engineer History: none Hobbies/Interests: walking, reading, playing cards  Mental Status Examination/Evaluation: Objective:  Appearance: Casual  Eye Contact::  Good  Speech:  Confused and rambling but better than in the past   Volume:  Normal  Mood:  neutral  Affect:  Congruent  Thought Process confused    Orientation:  Full (Time, Place, and Person)  Thought Content:  Recurrent auditory hallucinations and singing   Suicidal Thoughts:  No  Homicidal Thoughts:  No  Judgement:  Good  Insight:  Good  Psychomotor Activity:  Normal  Akathisia:  No  Handed:   Right  AIMS (if indicated):    Assets:  Communication Skills Desire for Improvement   Lab Results:  Results for orders placed in visit on 11/05/12 (from the past 8736 hour(s))  CBC WITH DIFFERENTIAL   Collection Time    11/05/12  1:57 PM      Result Value Range   WBC 7.7  4.5 - 10.5 K/uL   RBC 4.51  3.87 - 5.11 Mil/uL   Hemoglobin 12.6  12.0 - 15.0 g/dL   HCT 09.8  11.9 - 14.7 %   MCV 84.4  78.0 - 100.0 fl   MCHC 33.2  30.0 - 36.0 g/dL   RDW 82.9  56.2 - 13.0 %   Platelets 372.0  150.0 - 400.0 K/uL   Neutrophils Relative % 73.7  43.0 - 77.0 %   Lymphocytes Relative 17.0  12.0 - 46.0 %   Monocytes Relative 7.9  3.0 - 12.0 %   Eosinophils Relative 0.8  0.0 - 5.0 %   Basophils Relative 0.6  0.0 - 3.0 %   Neutro Abs 5.7  1.4 - 7.7 K/uL   Lymphs Abs 1.3  0.7 - 4.0 K/uL   Monocytes Absolute 0.6  0.1 - 1.0 K/uL   Eosinophils Absolute 0.1  0.0 - 0.7 K/uL   Basophils Absolute 0.0  0.0 - 0.1 K/uL  TSH   Collection Time    11/05/12  1:57 PM      Result Value Range   TSH 2.58  0.35 - 5.50 uIU/mL  COMPREHENSIVE METABOLIC PANEL   Collection Time    11/05/12  1:57 PM      Result Value Range   Sodium 135  135 - 145 mEq/L   Potassium 4.3  3.5 - 5.1 mEq/L   Chloride 104  96 - 112 mEq/L   CO2 25  19 - 32 mEq/L   Glucose, Bld 84  70 - 99 mg/dL   BUN 11  6 - 23 mg/dL   Creatinine, Ser 1.0  0.4 - 1.2 mg/dL   Total Bilirubin 0.5  0.3 - 1.2 mg/dL   Alkaline Phosphatase 82  39 - 117 U/L   AST 9  0 - 37 U/L   ALT 6  0 - 35 U/L   Total Protein 6.4  6.0 - 8.3 g/dL   Albumin 2.9 (*) 3.5 - 5.2 g/dL   Calcium 8.8  8.4 - 86.5 mg/dL   GFR 78.46 (*) >96.29 mL/min  POCT URINALYSIS DIPSTICK   Collection Time    11/09/12  8:47 AM      Result Value Range   Color, UA Yellow     Clarity, UA Cloudy     Glucose, UA Negative     Bilirubin, UA Negative     Ketones, UA Negative     Spec Grav, UA 1.015     Blood, UA Negative     pH, UA 6.5     Protein, UA Negative     Urobilinogen, UA 0.2      Nitrite, UA Negative     Leukocytes, UA Trace    URINALYSIS, MICROSCOPIC ONLY   Collection Time    11/09/12  8:58 AM      Result Value Range   Squamous Epithelial / LPF NONE SEEN  RARE   Crystals Calcium Oxalate crystals noted  NONE SEEN   Casts  NONE SEEN  NONE SEEN   WBC, UA 0-2  <3 WBC/hpf   RBC / HPF 0-2  <3 RBC/hpf   Bacteria, UA FEW (*) RARE  Results for orders placed during the hospital encounter of 03/14/12 (from the past 8736 hour(s))  URINE RAPID DRUG SCREEN (HOSP PERFORMED)   Collection Time    03/14/12  3:11 AM      Result Value Range   Opiates NONE DETECTED  NONE DETECTED   Cocaine NONE DETECTED  NONE DETECTED   Benzodiazepines NONE DETECTED  NONE DETECTED   Amphetamines NONE DETECTED  NONE DETECTED   Tetrahydrocannabinol NONE DETECTED  NONE DETECTED   Barbiturates NONE DETECTED  NONE DETECTED  ACETAMINOPHEN LEVEL   Collection Time    03/14/12  3:14 AM      Result Value Range   Acetaminophen (Tylenol), Serum <15.0  10 - 30 ug/mL  CBC   Collection Time    03/14/12  3:14 AM      Result Value Range   WBC 12.7 (*) 4.0 - 10.5 K/uL   RBC 5.11  3.87 - 5.11 MIL/uL   Hemoglobin 15.7 (*) 12.0 - 15.0 g/dL   HCT 84.6 (*) 96.2 - 95.2 %   MCV 91.0  78.0 - 100.0 fL   MCH 30.7  26.0 - 34.0 pg   MCHC 33.8  30.0 - 36.0 g/dL   RDW 84.1  32.4 - 40.1 %   Platelets 295  150 - 400 K/uL  COMPREHENSIVE METABOLIC PANEL   Collection Time    03/14/12  3:14 AM      Result Value Range   Sodium 136  135 - 145 mEq/L   Potassium 3.5  3.5 - 5.1 mEq/L   Chloride 98  96 - 112 mEq/L   CO2 25  19 - 32 mEq/L   Glucose, Bld 103 (*) 70 - 99 mg/dL   BUN 11  6 - 23 mg/dL   Creatinine, Ser 0.27 (*) 0.50 - 1.10 mg/dL   Calcium 9.7  8.4 - 25.3 mg/dL   Total Protein 7.8  6.0 - 8.3 g/dL   Albumin 4.1  3.5 - 5.2 g/dL   AST 18  0 - 37 U/L   ALT 11  0 - 35 U/L   Alkaline Phosphatase 67  39 - 117 U/L   Total Bilirubin 0.3  0.3 - 1.2 mg/dL   GFR calc non Af Amer 46 (*) >90 mL/min   GFR calc Af  Amer 53 (*) >90 mL/min  ETHANOL   Collection Time    03/14/12  3:14 AM      Result Value Range   Alcohol, Ethyl (B) <11  0 - 11 mg/dL  SALICYLATE LEVEL   Collection Time    03/14/12  3:14 AM      Result Value Range   Salicylate Lvl <2.0 (*) 2.8 - 20.0 mg/dL  URINE CULTURE   Collection Time    03/14/12  3:16 AM      Result Value Range   Specimen Description URINE, CLEAN CATCH     Special Requests NONE     Culture  Setup Time 03/14/2012 14:41     Colony Count 65,000 COLONIES/ML     Culture       Value: Multiple bacterial morphotypes present, none predominant. Suggest appropriate recollection if clinically indicated.   Report Status 03/15/2012 FINAL    URINALYSIS, ROUTINE W REFLEX MICROSCOPIC   Collection Time    03/14/12  3:16 AM  Result Value Range   Color, Urine YELLOW  YELLOW   APPearance CLOUDY (*) CLEAR   Specific Gravity, Urine 1.012  1.005 - 1.030   pH 5.5  5.0 - 8.0   Glucose, UA NEGATIVE  NEGATIVE mg/dL   Hgb urine dipstick SMALL (*) NEGATIVE   Bilirubin Urine NEGATIVE  NEGATIVE   Ketones, ur NEGATIVE  NEGATIVE mg/dL   Protein, ur NEGATIVE  NEGATIVE mg/dL   Urobilinogen, UA 0.2  0.0 - 1.0 mg/dL   Nitrite NEGATIVE  NEGATIVE   Leukocytes, UA LARGE (*) NEGATIVE  URINE MICROSCOPIC-ADD ON   Collection Time    03/14/12  3:16 AM      Result Value Range   Squamous Epithelial / LPF FEW (*) RARE   WBC, UA TOO NUMEROUS TO COUNT  <3 WBC/hpf   RBC / HPF 0-2  <3 RBC/hpf   Bacteria, UA FEW (*) RARE   Urine-Other LESS THAN 10 mL OF URINE SUBMITTED    TSH   Collection Time    03/14/12  1:12 PM      Result Value Range   TSH 0.674  0.350 - 4.500 uIU/mL  T4   Collection Time    03/14/12  1:12 PM      Result Value Range   T4, Total 13.4 (*) 5.0 - 12.5 ug/dL   Assessment:    AXIS I  major depression with psychotic features, rule out dementia   AXIS II Deferred  AXIS III Past Medical History  Diagnosis Date  . GERD (gastroesophageal reflux disease)      intermittent, treated with tums  . Hypothyroidism   . Scoliosis   . Hiatal hernia   . Adenomatous polyp     x1  . Lumbago   . Depression   . Diverticulitis   . Personal history of kidney stones     x1  . ALLERGIC RHINITIS 12/20/2006     AXIS IV economic problems and other psychosocial or environmental problems  AXIS V 61-70 mild symptoms   Treatment Plan/Recommendations:  Laboratory:  Vitamin D level get results  Psychotherapy: supportive  Medications: Risperdal, Lamictal, Effexor  Routine PRN Medications:  No  Consultations: none  Safety Concerns:  none  Other:     Plan/Discussion: I took her vitals.  I reviewed CC, tobacco/med/surg Hx, meds effects/ side effects, problem list, therapies and responses as well as current situation/symptoms discussed options. Continue current medications. Increase Risperdal to 2 mg each bedtime. If any stiffness or tremor occurs she is to call me immediately. She'll return in 2 months See orders and pt instructions for more details.  MEDICATIONS this encounter: Meds ordered this encounter  Medications  . venlafaxine XR (EFFEXOR-XR) 37.5 MG 24 hr capsule    Sig: Take 1 capsule (37.5 mg total) by mouth 2 (two) times daily.    Dispense:  60 capsule    Refill:  2  . risperiDONE (RISPERDAL) 1 MG tablet    Sig: Take 1 tablet (1 mg total) by mouth 2 (two) times daily.    Dispense:  60 tablet    Refill:  2  . lamoTRIgine (LAMICTAL) 25 MG tablet    Sig: Take 2 tablets (50 mg total) by mouth 2 (two) times daily.    Dispense:  120 tablet    Refill:  2  . benztropine (COGENTIN) 1 MG tablet    Sig: Take 1 tablet (1 mg total) by mouth 2 (two) times daily.    Dispense:  60 tablet  Refill:  2    Medical Decision Making Problem Points:  Established problem, stable/improving (1), New problem, with no additional work-up planned (3), Review of last therapy session (1) and Review of psycho-social stressors (1) Data Points:  Review or order clinical  lab tests (1) Review of medication regiment & side effects (2) Review of new medications or change in dosage (2)  four-week's I certify that outpatient services furnished can reasonably be expected to improve the patient's condition.   Diannia Ruder, MD

## 2013-02-23 ENCOUNTER — Telehealth: Payer: Self-pay | Admitting: Family Medicine

## 2013-02-23 ENCOUNTER — Encounter: Payer: Self-pay | Admitting: Family Medicine

## 2013-02-23 NOTE — Telephone Encounter (Signed)
plz call pt for an update - how is diarrhea and weight - if persistent weight loss please schedule appt for evaluation.  Last known weight was 142lbs 10/2012.

## 2013-02-24 NOTE — Telephone Encounter (Signed)
Noted. Thanks.

## 2013-02-24 NOTE — Telephone Encounter (Signed)
Spoke with patient. She said the diarrhea stopped on its own and her weight is at 141 now. She said she was concerned because she has gone down 4 sizes in 1 year without trying. I offered to schedule an appt for her to discuss this with you and she refused at this time stating she had an eye appt that she wanted to get out of the way first because it was too stressful to have two pending appts. She said she would call to schedule an appt with you after the eye appt.

## 2013-03-01 DIAGNOSIS — H25019 Cortical age-related cataract, unspecified eye: Secondary | ICD-10-CM | POA: Diagnosis not present

## 2013-03-01 DIAGNOSIS — H524 Presbyopia: Secondary | ICD-10-CM | POA: Diagnosis not present

## 2013-03-01 DIAGNOSIS — H43819 Vitreous degeneration, unspecified eye: Secondary | ICD-10-CM | POA: Diagnosis not present

## 2013-03-02 ENCOUNTER — Ambulatory Visit (INDEPENDENT_AMBULATORY_CARE_PROVIDER_SITE_OTHER): Payer: Medicare Other | Admitting: Family Medicine

## 2013-03-02 ENCOUNTER — Ambulatory Visit (INDEPENDENT_AMBULATORY_CARE_PROVIDER_SITE_OTHER)
Admission: RE | Admit: 2013-03-02 | Discharge: 2013-03-02 | Disposition: A | Discharge status: Home / Self Care | Payer: Medicare Other | Source: Ambulatory Visit | Attending: Family Medicine | Admitting: Family Medicine

## 2013-03-02 ENCOUNTER — Encounter: Payer: Self-pay | Admitting: Family Medicine

## 2013-03-02 VITALS — BP 130/80 | HR 88 | Temp 97.8°F | Wt 144.8 lb

## 2013-03-02 DIAGNOSIS — R05 Cough: Secondary | ICD-10-CM | POA: Insufficient documentation

## 2013-03-02 DIAGNOSIS — R059 Cough, unspecified: Secondary | ICD-10-CM

## 2013-03-02 DIAGNOSIS — K219 Gastro-esophageal reflux disease without esophagitis: Secondary | ICD-10-CM

## 2013-03-02 DIAGNOSIS — H6120 Impacted cerumen, unspecified ear: Secondary | ICD-10-CM | POA: Insufficient documentation

## 2013-03-02 DIAGNOSIS — R053 Chronic cough: Secondary | ICD-10-CM | POA: Insufficient documentation

## 2013-03-02 DIAGNOSIS — Z23 Encounter for immunization: Secondary | ICD-10-CM

## 2013-03-02 DIAGNOSIS — J449 Chronic obstructive pulmonary disease, unspecified: Secondary | ICD-10-CM | POA: Diagnosis not present

## 2013-03-02 MED ORDER — BENZONATATE 100 MG PO CAPS: 100.0000 mg | ORAL_CAPSULE | Freq: Two times a day (BID) | ORAL | Status: DC | PRN

## 2013-03-02 MED ORDER — OMEPRAZOLE 40 MG PO CPDR: 40.0000 mg | DELAYED_RELEASE_CAPSULE | Freq: Every day | ORAL | Status: DC

## 2013-03-02 NOTE — Addendum Note (Signed)
Addended by: Royann Shivers A on: 03/02/2013 01:11 PM   Modules accepted: Orders

## 2013-03-02 NOTE — Assessment & Plan Note (Addendum)
No evidence of infection today. Given fmhx and recent weight loss hx, check CXR today (although weight gain noted today). Anticipate GERD related - so will treat with omeprazole 40mg  daily x 3 wks.  I asked pt to notify us if any worsening, fever or new sxs, or not improving as expected. Tessalon perls to suppress cough.  Discussed swallow, don't chew

## 2013-03-02 NOTE — Progress Notes (Signed)
Subjective:    Patient ID: Jamie Pitts, female    DOB: [birth year], 80 y.o.   MRN: [id number]  HPI CC: cough  Jamie Pitts presents today with several week h/o cough present throughout the day that leads to gagging and emesis.  This happens regularly.  Overall dry cough.  Some dyspnea and wheezing with cough.  Some head and chest congestion.  Some chest burning as well.  Denies fevers/chills, chest pain,  So far has tried nothing for cough - OTC cough syrups make her feel ill.  Has been told has asthma in past but has never been on breathing medicines.   No h/o COPD or smoking. Occasional GERD treated with zantac No sick contacts at home. Flu shot received this year. States last pneumonia shot was 1990 and would like updated.  Colonoscopy 2009 per pt report in [place], 1 polyp.  No records of this available fmhx lung and kidney cancer (sisters, both smokers)  Wt Readings from Last 3 Encounters:  03/02/13 144 lb 12 oz (65.658 kg)  02/01/13 141 lb (63.957 kg)  12/30/12 142 lb 11.2 oz (64.728 kg)  weight stable.  Appetite better last few days.  Diarrhea much better - no trouble since last office visit.  Past Medical History  Diagnosis Date  . GERD (gastroesophageal reflux disease)     intermittent, treated with tums  . Hypothyroidism   . Scoliosis   . Hiatal hernia   . Adenomatous polyp     x1  . Lumbago   . Severe recurrent depression with psychosis   . Diverticulitis   . Personal history of kidney stones     x1  . ALLERGIC RHINITIS 12/20/2006  . Osteoporosis 2012    declined prolia  . Vitamin D deficiency   . Chronic anxiety   . Insomnia     Past Surgical History  Procedure Laterality Date  . Appendectomy  1954  . Cholecystectomy    . Abdominal hysterectomy  1979    ectopic pregnancy with one ovary removed  . Tonsillectomy    . Oophorectomy Right 1965    Ectopic pregnancy, removal of right ovary and tube- 1960  . Esophagogastroduodenoscopy    . Rotator  cuff repair Right   . Knee surgery Left 2012    L medial meniscal tear    Review of Systems Per HPI    Objective:   Physical Exam  Nursing note and vitals reviewed. Constitutional: She appears well-developed and well-nourished. No distress.  HENT:  Head: Normocephalic and atraumatic.  Right Ear: Hearing, external ear and ear canal normal.  Left Ear: Hearing, tympanic membrane, external ear and ear canal normal.  Nose: No mucosal edema or rhinorrhea. Right sinus exhibits no maxillary sinus tenderness and no frontal sinus tenderness. Left sinus exhibits no maxillary sinus tenderness and no frontal sinus tenderness.  Mouth/Throat: Uvula is midline, oropharynx is clear and moist and mucous membranes are normal. No oropharyngeal exudate, posterior oropharyngeal edema, posterior oropharyngeal erythema or tonsillar abscesses.  R canal covered by wax  Eyes: Conjunctivae and EOM are normal. Pupils are equal, round, and reactive to light. No scleral icterus.  Neck: Normal range of motion. Neck supple.  Cardiovascular: Normal rate, regular rhythm, normal heart sounds and intact distal pulses.   No murmur heard. Pulmonary/Chest: Effort normal and breath sounds normal. No respiratory distress. She has no wheezes. She has no rales.  Musculoskeletal: She exhibits no edema.  Lymphadenopathy:    She has no cervical adenopathy.  Skin: Skin  is warm and dry. No rash noted.          Assessment & Plan:

## 2013-03-02 NOTE — Assessment & Plan Note (Signed)
Anticipate GERD induced cough - treat with omeprazole 40mg  daily for 3 wks.

## 2013-03-02 NOTE — Progress Notes (Signed)
Pre-visit discussion using our clinic review tool. No additional management support is needed unless otherwise documented below in the visit note.  

## 2013-03-02 NOTE — Patient Instructions (Signed)
I don't think this is coming from lungs. We will check xray today. I think this is from reflux - so start omeprazole 40mg  once daily for 3 weeks to see if improvement in cough. May also use tessalon cough pearls - swallow, don't chew. Let me know if not improving with above or worsening cough. Pneumonia shot today.

## 2013-03-02 NOTE — Assessment & Plan Note (Addendum)
R ear. States she cannot get any water in ear 2/2 severe pain. Declines irrigation and declines curettage today "too painful" States in past has only tolerated ENT disimpaction with microscope. Declines referral to ENT today - "manageable"

## 2013-03-04 ENCOUNTER — Telehealth: Payer: Self-pay | Admitting: *Deleted

## 2013-03-04 DIAGNOSIS — H6120 Impacted cerumen, unspecified ear: Secondary | ICD-10-CM

## 2013-03-04 NOTE — Telephone Encounter (Signed)
Patient called and said she would like the ENT referral now.

## 2013-03-04 NOTE — Telephone Encounter (Signed)
Referral placed.

## 2013-03-09 DIAGNOSIS — H6120 Impacted cerumen, unspecified ear: Secondary | ICD-10-CM | POA: Diagnosis not present

## 2013-03-09 DIAGNOSIS — H7290 Unspecified perforation of tympanic membrane, unspecified ear: Secondary | ICD-10-CM | POA: Diagnosis not present

## 2013-03-25 ENCOUNTER — Ambulatory Visit (INDEPENDENT_AMBULATORY_CARE_PROVIDER_SITE_OTHER): Payer: Medicare Other | Admitting: Family Medicine

## 2013-03-25 ENCOUNTER — Encounter: Payer: Self-pay | Admitting: Family Medicine

## 2013-03-25 VITALS — BP 138/76 | HR 77 | Temp 97.5°F | Wt 149.2 lb

## 2013-03-25 DIAGNOSIS — R059 Cough, unspecified: Secondary | ICD-10-CM | POA: Diagnosis not present

## 2013-03-25 DIAGNOSIS — R05 Cough: Secondary | ICD-10-CM | POA: Diagnosis not present

## 2013-03-25 MED ORDER — DOXYCYCLINE HYCLATE 100 MG PO CAPS: 100.0000 mg | ORAL_CAPSULE | Freq: Two times a day (BID) | ORAL | Status: DC

## 2013-03-25 MED ORDER — FLUTICASONE PROPIONATE 50 MCG/ACT NA SUSP
2.0000 | Freq: Every day | NASAL | Status: DC
Start: 2013-03-25 — End: 2013-05-18

## 2013-03-25 NOTE — Progress Notes (Signed)
Pre-visit discussion using our clinic review tool. No additional management support is needed unless otherwise documented below in the visit note.  

## 2013-03-25 NOTE — Progress Notes (Signed)
   Subjective:    Patient ID: Jamie Pitts, female    DOB: 06-03-1933, 80 y.o.   MRN: [id number]  HPI CC: continued cough.  See prior note for details. Seen last month with several week h/o cough present throughout the day that leads to gagging and emesis. This happens regularly. Overall dry cough. Some dyspnea with coughing fits. + PNdrainage - thinks cough comes from this.  Cough worse at night time. Denies fevers/chills, denies dysphagia. However now endorses worsening R maxillary sinus pressure and head congestion - notes congestion is very noticeable when she starts having coughing fits. Last visit thought consistent with GERD - treated with omeprazole 40mg  daily for 3 weeks.  Also tessalon perls were prescribed to suppress cough.  Omeprazole helped GERD sxs but did not help cough at all.  Has been told has asthma in past but has never been on breathing medicines.  No h/o COPD or smoking.  Occasional GERD treated with zantac  No sick contacts at home.  Flu shot received this year.  pneumovax updated last visit.  Colonoscopy 2009 per pt report in [place], 1 polyp. No records of this available  fmhx lung and kidney cancer (sisters, both smokers)  She saw Dr. Janace Hoard who cleaned ears.  Feeling better. Lab Results  Component Value Date   TSH 2.58 11/05/2012   Wt Readings from Last 3 Encounters:  03/25/13 149 lb 4 oz (67.699 kg)  03/02/13 144 lb 12 oz (65.658 kg)  02/01/13 141 lb (63.957 kg)     Past Medical History  Diagnosis Date  . GERD (gastroesophageal reflux disease)     intermittent, treated with tums  . Hypothyroidism   . Scoliosis   . Hiatal hernia   . Adenomatous polyp     x1  . Lumbago   . Severe recurrent depression with psychosis   . Diverticulitis   . Personal history of kidney stones     x1  . ALLERGIC RHINITIS 12/20/2006  . Osteoporosis 2012    declined prolia  . Vitamin D deficiency   . Chronic anxiety   . Insomnia      Review of Systems Per  HPI    Objective:   Physical Exam  Nursing note and vitals reviewed. Constitutional: She appears well-developed and well-nourished. No distress.  HENT:  Head: Normocephalic and atraumatic.  Right Ear: Hearing, tympanic membrane, external ear and ear canal normal.  Left Ear: Hearing, tympanic membrane, external ear and ear canal normal.  Nose: No mucosal edema or rhinorrhea. Right sinus exhibits maxillary sinus tenderness. Right sinus exhibits no frontal sinus tenderness. Left sinus exhibits no maxillary sinus tenderness and no frontal sinus tenderness.  Mouth/Throat: Uvula is midline and mucous membranes are normal. Posterior oropharyngeal edema and posterior oropharyngeal erythema present. No oropharyngeal exudate or tonsillar abscesses.  Post oropharyngeal erythema and drainage present  Eyes: Conjunctivae and EOM are normal. Pupils are equal, round, and reactive to light. No scleral icterus.  Neck: Normal range of motion. Neck supple.  Cardiovascular: Normal rate, regular rhythm, normal heart sounds and intact distal pulses.   No murmur heard. Pulmonary/Chest: Effort normal and breath sounds normal. No respiratory distress. She has no wheezes. She has no rales.  Lymphadenopathy:    She has no cervical adenopathy.  Skin: Skin is warm and dry. No rash noted.       Assessment & Plan:

## 2013-03-25 NOTE — Assessment & Plan Note (Signed)
Did not respond to GERD treatment. Today exam more consistent with PNDrainage/allergy related cough. Treat with nasal saline and nasal steroid.  Discussed allergen avoidance. She did have discomfort of R maxillary sinus- ?sinusitis although no other significant evidence of this.   Provided with doxy script to fill in case any other sxs of infection (fever, worsening congestion) or if not improving with above. Pt agrees with plan.

## 2013-03-25 NOTE — Patient Instructions (Signed)
I also think this cough is coming from post nasal drip - treat with nasal saline and nasal steroid (flonase sent into pharmacy). I don't think there's an active sinus infection currently, but if fever, or worsening head congestion or facial pain, or not improving with above, may fill antibiotic prescription provided today (doxycycline 10 day course). Push fluids and rest as well.

## 2013-04-05 ENCOUNTER — Ambulatory Visit (HOSPITAL_COMMUNITY): Payer: Self-pay | Admitting: Psychiatry

## 2013-04-10 ENCOUNTER — Other Ambulatory Visit: Payer: Self-pay | Admitting: Endocrinology

## 2013-04-12 ENCOUNTER — Other Ambulatory Visit: Payer: Self-pay | Admitting: *Deleted

## 2013-04-12 MED ORDER — LEVOTHYROXINE SODIUM 88 MCG PO TABS: 88.0000 ug | ORAL_TABLET | Freq: Every morning | ORAL | Status: DC

## 2013-04-13 DIAGNOSIS — H25049 Posterior subcapsular polar age-related cataract, unspecified eye: Secondary | ICD-10-CM | POA: Diagnosis not present

## 2013-04-13 DIAGNOSIS — H25019 Cortical age-related cataract, unspecified eye: Secondary | ICD-10-CM | POA: Diagnosis not present

## 2013-04-13 DIAGNOSIS — H18419 Arcus senilis, unspecified eye: Secondary | ICD-10-CM | POA: Diagnosis not present

## 2013-04-13 DIAGNOSIS — H2510 Age-related nuclear cataract, unspecified eye: Secondary | ICD-10-CM | POA: Diagnosis not present

## 2013-04-13 DIAGNOSIS — H02839 Dermatochalasis of unspecified eye, unspecified eyelid: Secondary | ICD-10-CM | POA: Diagnosis not present

## 2013-04-14 ENCOUNTER — Encounter (HOSPITAL_COMMUNITY): Payer: Self-pay | Admitting: Psychiatry

## 2013-04-14 ENCOUNTER — Ambulatory Visit (INDEPENDENT_AMBULATORY_CARE_PROVIDER_SITE_OTHER): Payer: Medicare Other | Admitting: Psychiatry

## 2013-04-14 VITALS — BP 120/68 | Ht 65.0 in | Wt 153.0 lb

## 2013-04-14 DIAGNOSIS — F29 Unspecified psychosis not due to a substance or known physiological condition: Secondary | ICD-10-CM | POA: Diagnosis not present

## 2013-04-14 DIAGNOSIS — F341 Dysthymic disorder: Secondary | ICD-10-CM | POA: Diagnosis not present

## 2013-04-14 MED ORDER — BENZTROPINE MESYLATE 1 MG PO TABS: 1.0000 mg | ORAL_TABLET | Freq: Two times a day (BID) | ORAL | Status: DC

## 2013-04-14 MED ORDER — VENLAFAXINE HCL ER 37.5 MG PO CP24: 37.5000 mg | ORAL_CAPSULE | Freq: Two times a day (BID) | ORAL | Status: DC

## 2013-04-14 MED ORDER — RISPERIDONE 1 MG PO TABS: 1.0000 mg | ORAL_TABLET | Freq: Two times a day (BID) | ORAL | Status: DC

## 2013-04-14 MED ORDER — RISPERIDONE 0.25 MG PO TABS: 0.2500 mg | ORAL_TABLET | Freq: Every day | ORAL | Status: DC

## 2013-04-14 MED ORDER — LAMOTRIGINE 25 MG PO TABS: 50.0000 mg | ORAL_TABLET | Freq: Two times a day (BID) | ORAL | Status: DC

## 2013-04-14 MED ORDER — CLONAZEPAM 0.5 MG PO TABS: 0.5000 mg | ORAL_TABLET | Freq: Every day | ORAL | Status: DC

## 2013-04-14 NOTE — Progress Notes (Signed)
Patient ID: Jamie Pitts, female   DOB: [birth year], 80 y.o.   MRN: [id number] Patient ID: Jamie Pitts, female   DOB: 16-Nov-1933, 80 y.o.   MRN: [id number] Patient ID: Jamie Pitts, female   DOB: 1934/01/24, 80 y.o.   MRN: [id number] [facility] [id number] Progress Note Jamie Pitts MRN: [id number] DOB: [birth year] Age: 80 y.o.  Date: 04/14/2013 Start Time: 10:20 AM End Time: 10:50 AM  Chief Complaint: Chief Complaint  Patient presents with  . Depression  . Follow-up   Subjective: "I'm not hearing voices."   This patient is a 80 year old married white female who lives with her husband in [place]. She has one daughter and 3 grandchildren. She is retired from the [employer].  Apparently the patient had some sort of psychotic episode in 2013. She was admitted to [facility] because she was hearing voices at home. The voices got so bad that she cannot put a gun under the bed and wanted to shoot him. The patient is a poor historian and is hard of hearing and gets easily confused. She was placed on various medicines in the hospital which have been continued. It's unclear if she is medication compliant. For example she's not taking the Cogentin. She repeats herself a lot.  The patient states she still hearing voices every day. They're not frightening her like they were before but there seemed to her and she can't always get them to stop. She sleeping pretty well she denies being depressed but she is obviously not thinking clearly. She mentions that she's been diagnosed with parkinsonism but she doesn't have a tremor.  The patient returns after 2 months. She really began taking the Risperdal as prescribed, 1 mg twice a day. The voices have gone away and she is very excited about this. Her mood is generally good. Sometimes if she gets anxious she takes Risperdal 0.25 around dinnertime. She claims that her memory is still good. She's having a lot of difficulty sleeping and  has had this for years. Trazodone made her feel bad and she's already on Effexor for depression. I told her we could try very low dose of clonazepam to help with her sleeping problem.    History of Chief Complaint:   At age 80 pt went to a "fat farm" to fatten her up.  She remembers her father paid more attention to her than her mother did.  She experienced some anxiety when she moved [place].  She was in [employer] for 2 years, then she was the Glass blower/designer for [employer] and then got into [employer].  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  She noted feeling very home sick to go back to [place] her home place in 2010.  Her sister had been very nervous and she was beginning to feel anxious and was started on Klonopin.  ISince then she noted songs for 3 or 4 years.  Then in October of 2013, she started hearing voices non stop and she lost 30 pounds. This prompted her to get a gun and put it under the bed and the family got concerned.  She was admitted to [facility] on [facility] 24th.  She was stopped from the Klonopin and Lithium (pt doesn't understand how she got on that) and placed on Ripserdal, Trazodone, Lamictal, and Effexor with pretty good results, except she has very dry mouth at nigh  She has noted blurred vision since starting the meds.  She is getting new glasses for  herself.  Anxiety Symptoms include confusion, decreased concentration, dry mouth and nervous/anxious behavior. Patient reports no dizziness or suicidal ideas.     Review of Systems  HENT: Negative.   Gastrointestinal: Negative.   Genitourinary: Negative.   Neurological: Positive for weakness and light-headedness. Negative for dizziness, tremors, seizures, syncope, facial asymmetry, speech difficulty, numbness and headaches.       Lightheaded if gets up too fast.  Psychiatric/Behavioral: Positive for confusion, dysphoric mood and decreased concentration. Negative for suicidal ideas, hallucinations, behavioral  problems, sleep disturbance, self-injury and agitation. The patient is nervous/anxious. The patient is not hyperactive.    Physical Exam  Depressive Symptoms: none now  (Hypo) Manic Symptoms:   None  Anxiety Symptoms: Excessive Worry:  Yes Panic Symptoms:  No Agoraphobia:  No Obsessive Compulsive: Yes  Symptoms: ordliness Specific Phobias:  Yes Social Anxiety:  No  Psychotic Symptoms:  None  PTSD Symptoms: Ever had a traumatic exposure:  Yes Had a traumatic exposure in the last month:  No No other features  Traumatic Brain Injury: No   Past Psychiatric History: Diagnosis: Depression  Hospitalizations: once [facility]  Outpatient Care: PCP  Substance Abuse Care: none  Self-Mutilation: none  Suicidal Attempts: none  Violent Behaviors: one episode of getting a gun, but now sees that that is not the way to handle her voices    Allergies: Allergies  Allergen Reactions  . Amoxicillin Hives    Sores in mouth  . Morphine And Related Itching    Can tolerate if necessary  . Sulfonamide Derivatives Hives    Sores in mouth  . Valium [Diazepam] Itching    Can tolerate if necessary   Past Medical History:   Past Medical History  Diagnosis Date  . GERD (gastroesophageal reflux disease)     intermittent, treated with tums  . Hypothyroidism   . Scoliosis   . Hiatal hernia   . Adenomatous polyp     x1  . Lumbago   . Severe recurrent depression with psychosis   . Diverticulitis   . Personal history of kidney stones     x1  . ALLERGIC RHINITIS 12/20/2006  . Osteoporosis 2012    declined prolia  . Vitamin D deficiency   . Chronic anxiety   . Insomnia    Surgical History: Past Surgical History  Procedure Laterality Date  . Appendectomy  1954  . Cholecystectomy    . Abdominal hysterectomy  1979    ectopic pregnancy with one ovary removed  . Tonsillectomy    . Oophorectomy Right 1965    Ectopic pregnancy, removal of right ovary and tube- 1960  .  Esophagogastroduodenoscopy    . Rotator cuff repair Right   . Knee surgery Left 2012    L medial meniscal tear   History of Loss of Consciousness:  No Seizure History:  No Cardiac History:  No Allergies: Allergies  Allergen Reactions  . Amoxicillin Hives    Sores in mouth  . Morphine And Related Itching    Can tolerate if necessary  . Sulfonamide Derivatives Hives    Sores in mouth  . Valium [Diazepam] Itching    Can tolerate if necessary   Medical History: Past Medical History  Diagnosis Date  . GERD (gastroesophageal reflux disease)     intermittent, treated with tums  . Hypothyroidism   . Scoliosis   . Hiatal hernia   . Adenomatous polyp     x1  . Lumbago   . Severe recurrent depression with psychosis   .  Diverticulitis   . Personal history of kidney stones     x1  . ALLERGIC RHINITIS 12/20/2006  . Osteoporosis 2012    declined prolia  . Vitamin D deficiency   . Chronic anxiety   . Insomnia    Surgical History: Past Surgical History  Procedure Laterality Date  . Appendectomy  1954  . Cholecystectomy    . Abdominal hysterectomy  1979    ectopic pregnancy with one ovary removed  . Tonsillectomy    . Oophorectomy Right 1965    Ectopic pregnancy, removal of right ovary and tube- 1960  . Esophagogastroduodenoscopy    . Rotator cuff repair Right   . Knee surgery Left 2012    L medial meniscal tear   Family History: family history includes Anxiety disorder in her grandchild; Cancer in her sister and sister; Depression in her grandchild; Diabetes in her brother, father, mother, and sister; Healthy in her sister; Schizophrenia in her sister. There is no history of Alcohol abuse, Bipolar disorder, Dementia, Drug abuse, OCD, Paranoid behavior, Seizures, Sexual abuse, or Physical abuse. Reviewed and no changes noted.  Current Medications:  Current Outpatient Prescriptions  Medication Sig Dispense Refill  . benzonatate (TESSALON) 100 MG capsule Take 1 capsule (100  mg total) by mouth 2 (two) times daily as needed for cough.  30 capsule  0  . benztropine (COGENTIN) 1 MG tablet Take 1 tablet (1 mg total) by mouth 2 (two) times daily.  60 tablet  2  . clonazePAM (KLONOPIN) 0.5 MG tablet Take 1 tablet (0.5 mg total) by mouth at bedtime.  30 tablet  2  . dicyclomine (BENTYL) 10 MG capsule Take 10 mg by mouth daily as needed. For diverticulitis      . doxycycline (VIBRAMYCIN) 100 MG capsule Take 1 capsule (100 mg total) by mouth 2 (two) times daily.  20 capsule  0  . fluticasone (FLONASE) 50 MCG/ACT nasal spray Place 2 sprays into both nostrils daily.  16 g  3  . lamoTRIgine (LAMICTAL) 25 MG tablet Take 2 tablets (50 mg total) by mouth 2 (two) times daily.  120 tablet  2  . levothyroxine (SYNTHROID, LEVOTHROID) 88 MCG tablet Take 1 tablet (88 mcg total) by mouth every morning.  90 tablet  1  . omeprazole (PRILOSEC) 40 MG capsule Take 40 mg by mouth daily as needed.      . risperiDONE (RISPERDAL) 0.25 MG tablet Take 1 tablet (0.25 mg total) by mouth daily.  30 tablet  2  . risperiDONE (RISPERDAL) 1 MG tablet Take 1 tablet (1 mg total) by mouth 2 (two) times daily.  60 tablet  2  . venlafaxine XR (EFFEXOR-XR) 37.5 MG 24 hr capsule Take 1 capsule (37.5 mg total) by mouth 2 (two) times daily.  60 capsule  2  . vitamin B-12 (CYANOCOBALAMIN) 1000 MCG tablet Take 1,000 mcg by mouth daily.      Marland Kitchen VITAMIN D, CHOLECALCIFEROL, PO Take by mouth daily.      . Vitamin D, Ergocalciferol, (DRISDOL) 50000 UNITS CAPS capsule Take 1 capsule (50,000 Units total) by mouth every 7 (seven) days.  12 capsule  3   No current facility-administered medications for this visit.    Previous Psychotropic Medications:  Medication Dose   Klonopin     Lithium ?    Risperdal    Lamictal    Effexor    Trazodone    Substance Abuse History in the last 12 months: Substance Age of 1st Use Last Use  Amount Specific Type  Nicotine  none        Alcohol  20  March 13      Cannabis  none         Opiates  none        Cocaine  none        Methamphetamines  none        LSD  none        Ecstasy  none         Benzodiazepines  [age]      Caffeine  childhood  this AM      Inhalants  none        Others:       sugar  childhood  this AM     Medical Consequences of Substance Abuse: none  Legal Consequences of Substance Abuse: none  Family Consequences of Substance Abuse: none  Social History: Current Place of Residence: [address] [place] [address] Place of Birth: [place], [place] Family Members: husband Marital Status:  Married Children: 1  Sons: 0  Daughters: 1 Relationships: husbandf Education:  Dentist Problems/Performance: good Religious Beliefs/Practices: [employer] History of Abuse: none Occupational Experiences: clerical, office, Architect History:  Press photographer History: none Hobbies/Interests: walking, reading, playing cards  Mental Status Examination/Evaluation: Objective:  Appearance: Casual  Eye Contact::  Good  Speech: Clear and much more coparent   Volume:  Normal  Mood:  neutral  Affect:  Congruent  Thought Process fairly organized today   Orientation:  Full (Time, Place, and Person)  Thought Content:  Within normal limits no further auditory hallucination   Suicidal Thoughts:  No  Homicidal Thoughts:  No  Judgement:  Good  Insight:  Good  Psychomotor Activity:  Normal  Akathisia:  No  Handed:  Right  AIMS (if indicated):    Assets:  Communication Skills Desire for Improvement   Lab Results:  Results for orders placed in visit on 11/05/12 (from the past 8736 hour(s))  CBC WITH DIFFERENTIAL   Collection Time    11/05/12  1:57 PM      Result Value Ref Range   WBC 7.7  4.5 - 10.5 K/uL   RBC 4.51  3.87 - 5.11 Mil/uL   Hemoglobin 12.6  12.0 - 15.0 g/dL   HCT 38.0  36.0 - 46.0 %   MCV 84.4  78.0 - 100.0 fl   MCHC 33.2  30.0 - 36.0 g/dL   RDW 14.1  11.5 - 14.6 %   Platelets 372.0  150.0 - 400.0  K/uL   Neutrophils Relative % 73.7  43.0 - 77.0 %   Lymphocytes Relative 17.0  12.0 - 46.0 %   Monocytes Relative 7.9  3.0 - 12.0 %   Eosinophils Relative 0.8  0.0 - 5.0 %   Basophils Relative 0.6  0.0 - 3.0 %   Neutro Abs 5.7  1.4 - 7.7 K/uL   Lymphs Abs 1.3  0.7 - 4.0 K/uL   Monocytes Absolute 0.6  0.1 - 1.0 K/uL   Eosinophils Absolute 0.1  0.0 - 0.7 K/uL   Basophils Absolute 0.0  0.0 - 0.1 K/uL  TSH   Collection Time    11/05/12  1:57 PM      Result Value Ref Range   TSH 2.58  0.35 - 5.50 uIU/mL  COMPREHENSIVE METABOLIC PANEL   Collection Time    11/05/12  1:57 PM      Result Value Ref Range  Sodium 135  135 - 145 mEq/L   Potassium 4.3  3.5 - 5.1 mEq/L   Chloride 104  96 - 112 mEq/L   CO2 25  19 - 32 mEq/L   Glucose, Bld 84  70 - 99 mg/dL   BUN 11  6 - 23 mg/dL   Creatinine, Ser 1.0  0.4 - 1.2 mg/dL   Total Bilirubin 0.5  0.3 - 1.2 mg/dL   Alkaline Phosphatase 82  39 - 117 U/L   AST 9  0 - 37 U/L   ALT 6  0 - 35 U/L   Total Protein 6.4  6.0 - 8.3 g/dL   Albumin 2.9 (*) 3.5 - 5.2 g/dL   Calcium 8.8  8.4 - 10.5 mg/dL   GFR 59.18 (*) >60.00 mL/min  POCT URINALYSIS DIPSTICK   Collection Time    11/09/12  8:47 AM      Result Value Ref Range   Color, UA Yellow     Clarity, UA Cloudy     Glucose, UA Negative     Bilirubin, UA Negative     Ketones, UA Negative     Spec Grav, UA 1.015     Blood, UA Negative     pH, UA 6.5     Protein, UA Negative     Urobilinogen, UA 0.2     Nitrite, UA Negative     Leukocytes, UA Trace    URINALYSIS, MICROSCOPIC ONLY   Collection Time    11/09/12  8:58 AM      Result Value Ref Range   Squamous Epithelial / LPF NONE SEEN  RARE   Crystals Calcium Oxalate crystals noted  NONE SEEN   Casts NONE SEEN  NONE SEEN   WBC, UA 0-2  <3 WBC/hpf   RBC / HPF 0-2  <3 RBC/hpf   Bacteria, UA FEW (*) RARE   Assessment:    AXIS I  major depression with psychotic features, rule out dementia   AXIS II Deferred  AXIS III Past Medical History   Diagnosis Date  . GERD (gastroesophageal reflux disease)     intermittent, treated with tums  . Hypothyroidism   . Scoliosis   . Hiatal hernia   . Adenomatous polyp     x1  . Lumbago   . Severe recurrent depression with psychosis   . Diverticulitis   . Personal history of kidney stones     x1  . ALLERGIC RHINITIS 12/20/2006  . Osteoporosis 2012    declined prolia  . Vitamin D deficiency   . Chronic anxiety   . Insomnia      AXIS IV economic problems and other psychosocial or environmental problems  AXIS V 61-70 mild symptoms   Treatment Plan/Recommendations:  Laboratory:  Vitamin D level get results  Psychotherapy: supportive  Medications: Risperdal, Lamictal, Effexor  Routine PRN Medications:  No  Consultations: none  Safety Concerns:  none  Other:     Plan/Discussion: I took her vitals.  I reviewed CC, tobacco/med/surg Hx, meds effects/ side effects, problem list, therapies and responses as well as current situation/symptoms discussed options. Continue current medications. She is doing better. She'll continue her current medications and add clonazepam 0.5 mg each bedtime to help with insomnia. She'll return in 3 months See orders and pt instructions for more details.  MEDICATIONS this encounter: Meds ordered this encounter  Medications  . lamoTRIgine (LAMICTAL) 25 MG tablet    Sig: Take 2 tablets (50 mg total) by mouth 2 (two)  times daily.    Dispense:  120 tablet    Refill:  2  . benztropine (COGENTIN) 1 MG tablet    Sig: Take 1 tablet (1 mg total) by mouth 2 (two) times daily.    Dispense:  60 tablet    Refill:  2  . venlafaxine XR (EFFEXOR-XR) 37.5 MG 24 hr capsule    Sig: Take 1 capsule (37.5 mg total) by mouth 2 (two) times daily.    Dispense:  60 capsule    Refill:  2  . risperiDONE (RISPERDAL) 1 MG tablet    Sig: Take 1 tablet (1 mg total) by mouth 2 (two) times daily.    Dispense:  60 tablet    Refill:  2  . risperiDONE (RISPERDAL) 0.25 MG tablet     Sig: Take 1 tablet (0.25 mg total) by mouth daily.    Dispense:  30 tablet    Refill:  2  . clonazePAM (KLONOPIN) 0.5 MG tablet    Sig: Take 1 tablet (0.5 mg total) by mouth at bedtime.    Dispense:  30 tablet    Refill:  2    Medical Decision Making Problem Points:  Established problem, stable/improving (1), New problem, with no additional work-up planned (3), Review of last therapy session (1) and Review of psycho-social stressors (1) Data Points:  Review or order clinical lab tests (1) Review of medication regiment & side effects (2) Review of new medications or change in dosage (2)  four-week's I certify that outpatient services furnished can reasonably be expected to improve the patient's condition.   Levonne Spiller, MD

## 2013-04-26 ENCOUNTER — Ambulatory Visit: Payer: Self-pay | Admitting: Family Medicine

## 2013-05-07 ENCOUNTER — Encounter: Payer: Self-pay | Admitting: Family Medicine

## 2013-05-07 ENCOUNTER — Ambulatory Visit (INDEPENDENT_AMBULATORY_CARE_PROVIDER_SITE_OTHER): Payer: Medicare Other | Admitting: Family Medicine

## 2013-05-07 VITALS — BP 114/82 | HR 76 | Temp 97.5°F | Wt 152.8 lb

## 2013-05-07 DIAGNOSIS — R059 Cough, unspecified: Secondary | ICD-10-CM | POA: Diagnosis not present

## 2013-05-07 DIAGNOSIS — R05 Cough: Secondary | ICD-10-CM | POA: Diagnosis not present

## 2013-05-07 MED ORDER — ALBUTEROL SULFATE HFA 108 (90 BASE) MCG/ACT IN AERS: 2.0000 | INHALATION_SPRAY | Freq: Four times a day (QID) | RESPIRATORY_TRACT | Status: DC | PRN

## 2013-05-07 NOTE — Progress Notes (Signed)
Pre visit review using our clinic review tool, if applicable. No additional management support is needed unless otherwise documented below in the visit note. 

## 2013-05-07 NOTE — Assessment & Plan Note (Addendum)
GERD treated, allergic rhinitis/PNDrainage treated.  Both have improved but cough has not. Pt endorses h/o asthma.  Will treat with albuterol inh prn, and start claritin as well. If not improving, will ask her to return for spirometry. Pt agrees with plan. Offered spirometry today but pt declines, states has to return to care for husband today.

## 2013-05-07 NOTE — Patient Instructions (Signed)
I think this may be asthma related. Continue flonase, prilosec. Start claritin 10mg  daily for runny nose. Start albuterol inhaler to use 2 puffs every 6-8 hours as needed for cough. If no better with this, return for lung function test.

## 2013-05-07 NOTE — Progress Notes (Signed)
BP 114/82  Pulse 76  Temp(Src) 97.5 F (36.4 C) (Oral)  Wt 152 lb 12.8 oz (69.31 kg)  LMP 07/15/2012   CC: cough  Subjective:    Patient ID: Jamie Pitts, female    DOB: 03/11/1933, 80 y.o.   MRN: [id number]  HPI: Jamie Pitts is a 80 y.o. female presenting on 05/07/2013 for Cough   See prior notes for details.  Seen 02/2013 with several week h/o cough present throughout the day that leads to gagging. This happens regularly. Overall dry cough. Some dyspnea with coughing fits. Cough worsens with verbal activity and at night time.  Initially thought GERD related, but didn't respond to omeprazole 40mg  daily (continues taking this).  Then thought related to PNDrainage and allergic rhinitis, but has not improved on flonase daily for last several weeks.  Omeprazole helped GERD sxs but did not help cough at all. flonase helped PNdrainage. Tessalon perls helped cough as well but only temporarily.  She was also treated with a 10d course doxycycline to treat possible sinus infection component.    + rhinorrhea, no significant nasal congestion. Denies fevers/chills, denies dysphagia.   Has been told has asthma in past but has never been on breathing medicines. No h/o COPD or smoking.  Flu shot received this year.  Pneumovax updated last visit.  EXAM 02/2013:  CHEST 2 VIEW  COMPARISON: 03/14/2012.  FINDINGS:  Mediastinum and hilar structures are normal. Mild atelectasis and/or scarring noted along bases. COPD. No focal alveolar infiltrate. Degenerative changes and scoliosis thoracic spine.  IMPRESSION:  Pleural parenchymal scarring and COPD.   Relevant past medical, surgical, family and social history reviewed and updated as indicated.  Allergies and medications reviewed and updated. Current Outpatient Prescriptions on File Prior to Visit  Medication Sig  . benztropine (COGENTIN) 1 MG tablet Take 1 tablet (1 mg total) by mouth 2 (two) times daily.  . clonazePAM (KLONOPIN) 0.5 MG  tablet Take 1 tablet (0.5 mg total) by mouth at bedtime.  . dicyclomine (BENTYL) 10 MG capsule Take 10 mg by mouth daily as needed. For diverticulitis  . fluticasone (FLONASE) 50 MCG/ACT nasal spray Place 2 sprays into both nostrils daily.  Marland Kitchen lamoTRIgine (LAMICTAL) 25 MG tablet Take 2 tablets (50 mg total) by mouth 2 (two) times daily.  Marland Kitchen levothyroxine (SYNTHROID, LEVOTHROID) 88 MCG tablet Take 1 tablet (88 mcg total) by mouth every morning.  Marland Kitchen omeprazole (PRILOSEC) 40 MG capsule Take 40 mg by mouth daily as needed.  . risperiDONE (RISPERDAL) 0.25 MG tablet Take 1 tablet (0.25 mg total) by mouth daily.  . risperiDONE (RISPERDAL) 1 MG tablet Take 1 tablet (1 mg total) by mouth 2 (two) times daily.  Marland Kitchen venlafaxine XR (EFFEXOR-XR) 37.5 MG 24 hr capsule Take 1 capsule (37.5 mg total) by mouth 2 (two) times daily.  . vitamin B-12 (CYANOCOBALAMIN) 1000 MCG tablet Take 1,000 mcg by mouth daily.  Marland Kitchen VITAMIN D, CHOLECALCIFEROL, PO Take by mouth daily.  . Vitamin D, Ergocalciferol, (DRISDOL) 50000 UNITS CAPS capsule Take 1 capsule (50,000 Units total) by mouth every 7 (seven) days.   No current facility-administered medications on file prior to visit.    Review of Systems Per HPI unless specifically indicated above    Objective:    BP 114/82  Pulse 76  Temp(Src) 97.5 F (36.4 C) (Oral)  Wt 152 lb 12.8 oz (69.31 kg)  LMP 07/15/2012  Physical Exam  Nursing note and vitals reviewed. Constitutional: She appears well-developed and well-nourished. No distress.  HENT:  Nose: Mucosal edema and rhinorrhea present.  Mouth/Throat: Uvula is midline. No oropharyngeal exudate, posterior oropharyngeal edema, posterior oropharyngeal erythema or tonsillar abscesses.  Some mucosal inflammation  Cardiovascular: Normal rate, regular rhythm, normal heart sounds and intact distal pulses.   No murmur heard. Pulmonary/Chest: Effort normal and breath sounds normal. No respiratory distress. She has no wheezes. She has  no rales.       Assessment & Plan:   Problem List Items Addressed This Visit   Cough - Primary     GERD treated, allergic rhinitis/PNDrainage treated.  Both have improved but cough has not. Pt endorses h/o asthma.  Will treat with albuterol inh prn, and start claritin as well. If not improving, will ask her to return for spirometry. Pt agrees with plan. Offered spirometry today but pt declines, states has to return to care for husband today.        Follow up plan: Return if symptoms worsen or fail to improve.

## 2013-05-13 ENCOUNTER — Other Ambulatory Visit: Payer: Self-pay | Admitting: Endocrinology

## 2013-05-18 ENCOUNTER — Encounter (HOSPITAL_COMMUNITY): Payer: Self-pay | Admitting: Emergency Medicine

## 2013-05-18 ENCOUNTER — Emergency Department (HOSPITAL_COMMUNITY)
Admission: EM | Admit: 2013-05-18 | Discharge: 2013-05-18 | Disposition: A | Discharge status: Home / Self Care | Payer: Medicare Other | Attending: Emergency Medicine | Admitting: Emergency Medicine

## 2013-05-18 ENCOUNTER — Emergency Department (HOSPITAL_COMMUNITY): Payer: Medicare Other

## 2013-05-18 DIAGNOSIS — M25559 Pain in unspecified hip: Secondary | ICD-10-CM | POA: Diagnosis not present

## 2013-05-18 DIAGNOSIS — S79929A Unspecified injury of unspecified thigh, initial encounter: Secondary | ICD-10-CM | POA: Diagnosis not present

## 2013-05-18 DIAGNOSIS — E039 Hypothyroidism, unspecified: Secondary | ICD-10-CM | POA: Diagnosis not present

## 2013-05-18 DIAGNOSIS — E559 Vitamin D deficiency, unspecified: Secondary | ICD-10-CM | POA: Diagnosis not present

## 2013-05-18 DIAGNOSIS — Z79899 Other long term (current) drug therapy: Secondary | ICD-10-CM | POA: Insufficient documentation

## 2013-05-18 DIAGNOSIS — F333 Major depressive disorder, recurrent, severe with psychotic symptoms: Secondary | ICD-10-CM | POA: Diagnosis not present

## 2013-05-18 DIAGNOSIS — IMO0002 Reserved for concepts with insufficient information to code with codable children: Secondary | ICD-10-CM | POA: Diagnosis not present

## 2013-05-18 DIAGNOSIS — Z860100 Personal history of colon polyps, unspecified: Secondary | ICD-10-CM | POA: Insufficient documentation

## 2013-05-18 DIAGNOSIS — K219 Gastro-esophageal reflux disease without esophagitis: Secondary | ICD-10-CM | POA: Insufficient documentation

## 2013-05-18 DIAGNOSIS — S79919A Unspecified injury of unspecified hip, initial encounter: Secondary | ICD-10-CM | POA: Diagnosis not present

## 2013-05-18 DIAGNOSIS — F411 Generalized anxiety disorder: Secondary | ICD-10-CM | POA: Diagnosis not present

## 2013-05-18 DIAGNOSIS — Z87442 Personal history of urinary calculi: Secondary | ICD-10-CM | POA: Insufficient documentation

## 2013-05-18 DIAGNOSIS — M169 Osteoarthritis of hip, unspecified: Secondary | ICD-10-CM | POA: Diagnosis not present

## 2013-05-18 DIAGNOSIS — M1610 Unilateral primary osteoarthritis, unspecified hip: Secondary | ICD-10-CM | POA: Diagnosis not present

## 2013-05-18 MED ORDER — OXYCODONE-ACETAMINOPHEN 5-325 MG PO TABS
1.0000 | ORAL_TABLET | Freq: Once | ORAL | Status: AC
  Administered 2013-05-18: 1 via ORAL
  Filled 2013-05-18: qty 1

## 2013-05-18 MED ORDER — OXYCODONE-ACETAMINOPHEN 5-325 MG PO TABS: 1.0000 | ORAL_TABLET | ORAL | Status: DC | PRN

## 2013-05-18 MED ORDER — IBUPROFEN 200 MG PO TABS
200.0000 mg | ORAL_TABLET | Freq: Once | ORAL | Status: AC
  Administered 2013-05-18: 200 mg via ORAL
  Filled 2013-05-18: qty 1

## 2013-05-18 NOTE — Discharge Instructions (Signed)

## 2013-05-18 NOTE — ED Notes (Signed)
Dr Allen at bedside  

## 2013-05-18 NOTE — ED Notes (Signed)
Pt reports that her husband had a fall yesterday and pt was trying to help him up and now has severe right hip and groin pain, only occurs when she bears weight and is unable to walk.

## 2013-05-18 NOTE — ED Provider Notes (Signed)
CSN: [id number]     Arrival date & time 05/18/13  1027 History   First MD Initiated Contact with Patient 05/18/13 1106     Chief Complaint  Patient presents with  . Hip Pain     (Consider location/radiation/quality/duration/timing/severity/associated sxs/prior Treatment) Patient is a 80 y.o. female presenting with hip pain. The history is provided by the patient.  Hip Pain   Patient here complaining of pain to her right buttock which began yesterday. Patient states is helping her husband off the floor and initially thereafter she began his bed a sharp pain radiating down her leg. Denies anesthesias. No urinary symptoms. No rashes or flank area. Pain characterized as sharp and worse with movement. Did take Tylenol without relief. Denies any prior history of back pain. She is able to ambulate but does have pain with that. Past Medical History  Diagnosis Date  . GERD (gastroesophageal reflux disease)     intermittent, treated with tums  . Hypothyroidism   . Scoliosis   . Hiatal hernia   . Adenomatous polyp     x1  . Lumbago   . Severe recurrent depression with psychosis   . Diverticulitis   . Personal history of kidney stones     x1  . ALLERGIC RHINITIS 12/20/2006  . Osteoporosis 2012    declined prolia  . Vitamin D deficiency   . Chronic anxiety   . Insomnia    Past Surgical History  Procedure Laterality Date  . Appendectomy  1954  . Cholecystectomy    . Abdominal hysterectomy  1979    ectopic pregnancy with one ovary removed  . Tonsillectomy    . Oophorectomy Right 1965    Ectopic pregnancy, removal of right ovary and tube- 1960  . Esophagogastroduodenoscopy    . Rotator cuff repair Right   . Knee surgery Left 2012    L medial meniscal tear   Family History  Problem Relation Age of Onset  . Schizophrenia Sister   . Alcohol abuse Neg Hx   . Bipolar disorder Neg Hx   . Dementia Neg Hx   . Drug abuse Neg Hx   . OCD Neg Hx   . Paranoid behavior Neg Hx   . Seizures  Neg Hx   . Sexual abuse Neg Hx   . Physical abuse Neg Hx   . Anxiety disorder Grandchild   . Depression Grandchild   . Cancer Sister     lung  . Healthy Sister   . Diabetes Mother   . Diabetes Father   . Diabetes Sister   . Diabetes Brother   . Cancer Sister     kidney   History  Substance Use Topics  . Smoking status: Never Smoker   . Smokeless tobacco: Never Used  . Alcohol Use: No   OB History   Grav Para Term Preterm Abortions TAB SAB Ect Mult Living                 Review of Systems  All other systems reviewed and are negative.      Allergies  Amoxicillin; Morphine and related; Sulfonamide derivatives; and Valium  Home Medications   Current Outpatient Rx  Name  Route  Sig  Dispense  Refill  . albuterol (PROVENTIL HFA;VENTOLIN HFA) 108 (90 BASE) MCG/ACT inhaler   Inhalation   Inhale 2 puffs into the lungs every 6 (six) hours as needed (cough).   1 Inhaler   3   . benztropine (COGENTIN) 1 MG tablet  Oral   Take 1 tablet (1 mg total) by mouth 2 (two) times daily.   60 tablet   2   . clonazePAM (KLONOPIN) 0.5 MG tablet   Oral   Take 1 tablet (0.5 mg total) by mouth at bedtime.   30 tablet   2   . dicyclomine (BENTYL) 10 MG capsule   Oral   Take 10 mg by mouth daily as needed. For diverticulitis         . estradiol (CLIMARA - DOSED IN MG/24 HR) 0.025 mg/24hr patch      APPLY 1 PATCH TO CLEAN, DRY SKIN ONCE A WEEK   12 patch   4   . fluticasone (FLONASE) 50 MCG/ACT nasal spray   Each Nare   Place 2 sprays into both nostrils daily.   16 g   3   . lamoTRIgine (LAMICTAL) 25 MG tablet   Oral   Take 2 tablets (50 mg total) by mouth 2 (two) times daily.   120 tablet   2   . levothyroxine (SYNTHROID, LEVOTHROID) 88 MCG tablet   Oral   Take 1 tablet (88 mcg total) by mouth every morning.   90 tablet   1   . loratadine (CLARITIN) 10 MG tablet   Oral   Take 10 mg by mouth daily.         Marland Kitchen omeprazole (PRILOSEC) 40 MG capsule   Oral    Take 40 mg by mouth daily as needed.         . risperiDONE (RISPERDAL) 0.25 MG tablet   Oral   Take 1 tablet (0.25 mg total) by mouth daily.   30 tablet   2   . risperiDONE (RISPERDAL) 1 MG tablet   Oral   Take 1 tablet (1 mg total) by mouth 2 (two) times daily.   60 tablet   2   . venlafaxine XR (EFFEXOR-XR) 37.5 MG 24 hr capsule   Oral   Take 1 capsule (37.5 mg total) by mouth 2 (two) times daily.   60 capsule   2   . vitamin B-12 (CYANOCOBALAMIN) 1000 MCG tablet   Oral   Take 1,000 mcg by mouth daily.         Marland Kitchen VITAMIN D, CHOLECALCIFEROL, PO   Oral   Take by mouth daily.         . Vitamin D, Ergocalciferol, (DRISDOL) 50000 UNITS CAPS capsule   Oral   Take 1 capsule (50,000 Units total) by mouth every 7 (seven) days.   12 capsule   3    BP 129/66  Pulse 80  Temp(Src) 97.8 F (36.6 C) (Oral)  Resp 16  SpO2 96%  LMP 07/15/2012 Physical Exam  Nursing note and vitals reviewed. Constitutional: She is oriented to person, place, and time. She appears well-developed and well-nourished.  Non-toxic appearance. No distress.  HENT:  Head: Normocephalic and atraumatic.  Eyes: Conjunctivae, EOM and lids are normal. Pupils are equal, round, and reactive to light.  Neck: Normal range of motion. Neck supple. No tracheal deviation present. No mass present.  Cardiovascular: Normal rate, regular rhythm and normal heart sounds.  Exam reveals no gallop.   No murmur heard. Pulmonary/Chest: Effort normal and breath sounds normal. No stridor. No respiratory distress. She has no decreased breath sounds. She has no wheezes. She has no rhonchi. She has no rales.  Abdominal: Soft. Normal appearance and bowel sounds are normal. She exhibits no distension. There is no tenderness. There is no  rebound and no CVA tenderness.  Musculoskeletal: Normal range of motion. She exhibits no edema and no tenderness.       Legs: Neurological: She is alert and oriented to person, place, and time.  She has normal strength. No cranial nerve deficit or sensory deficit. GCS eye subscore is 4. GCS verbal subscore is 5. GCS motor subscore is 6.  Skin: Skin is warm and dry. No abrasion and no rash noted.  Psychiatric: She has a normal mood and affect. Her speech is normal and behavior is normal.    ED Course  Procedures (including critical care time) Labs Review Labs Reviewed - No data to display Imaging Review Dg Hip Complete Right  05/18/2013   CLINICAL DATA:  Right hip pain.  Right hip injury.  EXAM: RIGHT HIP - COMPLETE 2+ VIEW  COMPARISON:  None.  FINDINGS: Partially visualized lumbar scoliosis. Pelvic rings appear intact. Right hip joint space appears normal. No joint space narrowing or marginal osteophytes. No fracture.  IMPRESSION: Negative.   Electronically Signed   By: Dereck Ligas M.D.   On: 05/18/2013 11:16     EKG Interpretation None      MDM   Final diagnoses:  None    Pt given meds and feels better    Leota Jacobsen, MD 05/18/13 1256

## 2013-05-18 NOTE — ED Notes (Signed)
Pt comfortable with discharge and follow up instructions. Prescriptions x1 

## 2013-06-07 DIAGNOSIS — H2510 Age-related nuclear cataract, unspecified eye: Secondary | ICD-10-CM | POA: Diagnosis not present

## 2013-06-07 DIAGNOSIS — Z961 Presence of intraocular lens: Secondary | ICD-10-CM | POA: Diagnosis not present

## 2013-06-07 DIAGNOSIS — H269 Unspecified cataract: Secondary | ICD-10-CM | POA: Diagnosis not present

## 2013-06-08 DIAGNOSIS — H2510 Age-related nuclear cataract, unspecified eye: Secondary | ICD-10-CM | POA: Diagnosis not present

## 2013-06-28 DIAGNOSIS — H269 Unspecified cataract: Secondary | ICD-10-CM | POA: Diagnosis not present

## 2013-06-28 DIAGNOSIS — H2510 Age-related nuclear cataract, unspecified eye: Secondary | ICD-10-CM | POA: Diagnosis not present

## 2013-06-28 DIAGNOSIS — Z961 Presence of intraocular lens: Secondary | ICD-10-CM | POA: Diagnosis not present

## 2013-07-02 ENCOUNTER — Other Ambulatory Visit (HOSPITAL_COMMUNITY): Payer: Self-pay | Admitting: Psychiatry

## 2013-07-09 ENCOUNTER — Ambulatory Visit (HOSPITAL_COMMUNITY): Payer: Self-pay | Admitting: Psychiatry

## 2013-07-09 ENCOUNTER — Ambulatory Visit (INDEPENDENT_AMBULATORY_CARE_PROVIDER_SITE_OTHER): Payer: Medicare Other | Admitting: Psychiatry

## 2013-07-09 ENCOUNTER — Encounter (HOSPITAL_COMMUNITY): Payer: Self-pay | Admitting: Psychiatry

## 2013-07-09 VITALS — BP 110/72 | Ht 65.0 in | Wt 154.0 lb

## 2013-07-09 DIAGNOSIS — F329 Major depressive disorder, single episode, unspecified: Secondary | ICD-10-CM

## 2013-07-09 DIAGNOSIS — F341 Dysthymic disorder: Secondary | ICD-10-CM

## 2013-07-09 DIAGNOSIS — F29 Unspecified psychosis not due to a substance or known physiological condition: Secondary | ICD-10-CM | POA: Diagnosis not present

## 2013-07-09 MED ORDER — VENLAFAXINE HCL ER 37.5 MG PO CP24: 37.5000 mg | ORAL_CAPSULE | Freq: Two times a day (BID) | ORAL | Status: DC

## 2013-07-09 MED ORDER — RISPERIDONE 0.25 MG PO TABS: 0.7500 mg | ORAL_TABLET | Freq: Every day | ORAL | Status: DC | PRN

## 2013-07-09 MED ORDER — RISPERIDONE 1 MG PO TABS: 1.0000 mg | ORAL_TABLET | Freq: Two times a day (BID) | ORAL | Status: DC

## 2013-07-09 MED ORDER — LAMOTRIGINE 25 MG PO TABS: 50.0000 mg | ORAL_TABLET | Freq: Two times a day (BID) | ORAL | Status: DC

## 2013-07-09 MED ORDER — CLONAZEPAM 0.5 MG PO TABS: 0.5000 mg | ORAL_TABLET | Freq: Every day | ORAL | Status: DC

## 2013-07-09 NOTE — Progress Notes (Signed)
Patient ID: Jamie Pitts, female   DOB: Nov 25, 1933, 80 y.o.   MRN: [id number] Patient ID: Jamie Pitts, female   DOB: June 07, 1933, 80 y.o.   MRN: [id number] Patient ID: Jamie Pitts, female   DOB: 1933-08-15, 80 y.o.   MRN: [id number] Patient ID: Jamie Pitts, female   DOB: 06/17/1933, 80 y.o.   MRN: [id number] [facility] [id number] Progress Note Robbyn Hodkinson MRN: [id number] DOB: [birth year] Age: 80 y.o.  Date: 07/09/2013 Start Time: 10:20 AM End Time: 10:50 AM  Chief Complaint: Chief Complaint  Patient presents with  . Anxiety  . Depression  . Hallucinations  . Follow-up   Subjective: "I'm not hearing voices."   This patient is a 80 year old married white female who lives with her husband in [place]. She has one daughter and 3 grandchildren. She is retired from the [employer].  Apparently the patient had some sort of psychotic episode in 2013. She was admitted to [facility] because she was hearing voices at home. The voices got so bad that she cannot put a gun under the bed and wanted to shoot him. The patient is a poor historian and is hard of hearing and gets easily confused. She was placed on various medicines in the hospital which have been continued. It's unclear if she is medication compliant. For example she's not taking the Cogentin. She repeats herself a lot.  The patient states she still hearing voices every day. They're not frightening her like they were before but there seemed to her and she can't always get them to stop. She sleeping pretty well she denies being depressed but she is obviously not thinking clearly. She mentions that she's been diagnosed with parkinsonism but she doesn't have a tremor.  The patient returns after 3 months. She claims she is doing fairly well. She no longer hears voices but sometimes hears music and singing. She is learning to cope with that and tries to drown it out. She's no longer depressed in her mood is been  good. The clonazepam has helped her sleep. She asked me to increase the dose but I'm not willing to do this because of her age and the fact that it could make her more unsteady. Her husband has recently been very well after gallbladder removal but is starting to do better. Her thought processes much more organized than when I first met her but she still gets a bit confused about the medications    History of Chief Complaint:   At age 59 pt went to a "fat farm" to fatten her up.  She remembers her father paid more attention to her than her mother did.  She experienced some anxiety when she moved [place].  She was in [employer] for 2 years, then she was the Glass blower/designer for [employer] and then got into [employer].  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  She noted feeling very home sick to go back to [place] her home place in 2010.  Her sister had been very nervous and she was beginning to feel anxious and was started on Klonopin.  ISince then she noted songs for 3 or 4 years.  Then in October of 2013, she started hearing voices non stop and she lost 30 pounds. This prompted her to get a gun and put it under the bed and the family got concerned.  She was admitted to [facility] [place] on January 24th.  She was stopped from the Klonopin and Lithium (pt doesn't  understand how she got on that) and placed on Ripserdal, Trazodone, Lamictal, and Effexor with pretty good results, except she has very dry mouth at nigh  She has noted blurred vision since starting the meds.  She is getting new glasses for herself.  Anxiety Symptoms include confusion, decreased concentration, dry mouth and nervous/anxious behavior. Patient reports no dizziness or suicidal ideas.     Review of Systems  HENT: Negative.   Gastrointestinal: Negative.   Genitourinary: Negative.   Neurological: Positive for weakness and light-headedness. Negative for dizziness, tremors, seizures, syncope, facial asymmetry, speech difficulty,  numbness and headaches.       Lightheaded if gets up too fast.  Psychiatric/Behavioral: Positive for confusion, dysphoric mood and decreased concentration. Negative for suicidal ideas, hallucinations, behavioral problems, sleep disturbance, self-injury and agitation. The patient is nervous/anxious. The patient is not hyperactive.    Physical Exam  Depressive Symptoms: none now  (Hypo) Manic Symptoms:   None  Anxiety Symptoms: Excessive Worry:  Yes Panic Symptoms:  No Agoraphobia:  No Obsessive Compulsive: Yes  Symptoms: ordliness Specific Phobias:  Yes Social Anxiety:  No  Psychotic Symptoms:  None  PTSD Symptoms: Ever had a traumatic exposure:  Yes Had a traumatic exposure in the last month:  No No other features  Traumatic Brain Injury: No   Past Psychiatric History: Diagnosis: Depression  Hospitalizations: once [facility]  Outpatient Care: PCP  Substance Abuse Care: none  Self-Mutilation: none  Suicidal Attempts: none  Violent Behaviors: one episode of getting a gun, but now sees that that is not the way to handle her voices    Allergies: Allergies  Allergen Reactions  . Amoxicillin Hives    Sores in mouth  . Morphine And Related Itching    Can tolerate if necessary  . Sulfonamide Derivatives Hives    Sores in mouth  . Valium [Diazepam] Itching    Can tolerate if necessary   Past Medical History:   Past Medical History  Diagnosis Date  . GERD (gastroesophageal reflux disease)     intermittent, treated with tums  . Hypothyroidism   . Scoliosis   . Hiatal hernia   . Adenomatous polyp     x1  . Lumbago   . Severe recurrent depression with psychosis   . Diverticulitis   . Personal history of kidney stones     x1  . ALLERGIC RHINITIS 12/20/2006  . Osteoporosis 2012    declined prolia  . Vitamin D deficiency   . Chronic anxiety   . Insomnia    Surgical History: Past Surgical History  Procedure Laterality Date  . Appendectomy  1954  .  Cholecystectomy    . Abdominal hysterectomy  1979    ectopic pregnancy with one ovary removed  . Tonsillectomy    . Oophorectomy Right 1965    Ectopic pregnancy, removal of right ovary and tube- 1960  . Esophagogastroduodenoscopy    . Rotator cuff repair Right   . Knee surgery Left 2012    L medial meniscal tear   History of Loss of Consciousness:  No Seizure History:  No Cardiac History:  No Allergies: Allergies  Allergen Reactions  . Amoxicillin Hives    Sores in mouth  . Morphine And Related Itching    Can tolerate if necessary  . Sulfonamide Derivatives Hives    Sores in mouth  . Valium [Diazepam] Itching    Can tolerate if necessary   Medical History: Past Medical History  Diagnosis Date  . GERD (gastroesophageal reflux  disease)     intermittent, treated with tums  . Hypothyroidism   . Scoliosis   . Hiatal hernia   . Adenomatous polyp     x1  . Lumbago   . Severe recurrent depression with psychosis   . Diverticulitis   . Personal history of kidney stones     x1  . ALLERGIC RHINITIS 12/20/2006  . Osteoporosis 2012    declined prolia  . Vitamin D deficiency   . Chronic anxiety   . Insomnia    Surgical History: Past Surgical History  Procedure Laterality Date  . Appendectomy  1954  . Cholecystectomy    . Abdominal hysterectomy  1979    ectopic pregnancy with one ovary removed  . Tonsillectomy    . Oophorectomy Right 1965    Ectopic pregnancy, removal of right ovary and tube- 1960  . Esophagogastroduodenoscopy    . Rotator cuff repair Right   . Knee surgery Left 2012    L medial meniscal tear   Family History: family history includes Anxiety disorder in her grandchild; Cancer in her sister and sister; Depression in her grandchild; Diabetes in her brother, father, mother, and sister; Healthy in her sister; Schizophrenia in her sister. There is no history of Alcohol abuse, Bipolar disorder, Dementia, Drug abuse, OCD, Paranoid behavior, Seizures, Sexual  abuse, or Physical abuse. Reviewed and no changes noted.  Current Medications:  Current Outpatient Prescriptions  Medication Sig Dispense Refill  . albuterol (PROVENTIL HFA;VENTOLIN HFA) 108 (90 BASE) MCG/ACT inhaler Inhale 2 puffs into the lungs every 6 (six) hours as needed (cough).  1 Inhaler  3  . benztropine (COGENTIN) 1 MG tablet Take 1 tablet (1 mg total) by mouth 2 (two) times daily.  60 tablet  2  . clonazePAM (KLONOPIN) 0.5 MG tablet Take 1 tablet (0.5 mg total) by mouth at bedtime.  30 tablet  2  . dicyclomine (BENTYL) 10 MG capsule Take 10 mg by mouth daily as needed. For diverticulitis      . fluticasone (FLONASE) 50 MCG/ACT nasal spray Place 2 sprays into both nostrils daily as needed for allergies or rhinitis.      Marland Kitchen lamoTRIgine (LAMICTAL) 25 MG tablet Take 2 tablets (50 mg total) by mouth 2 (two) times daily.  120 tablet  2  . levothyroxine (SYNTHROID, LEVOTHROID) 88 MCG tablet Take 1 tablet (88 mcg total) by mouth every morning.  90 tablet  1  . omeprazole (PRILOSEC) 40 MG capsule Take 40 mg by mouth daily as needed (acid reflux).       Marland Kitchen oxyCODONE-acetaminophen (PERCOCET/ROXICET) 5-325 MG per tablet Take 1-2 tablets by mouth every 4 (four) hours as needed for severe pain.  10 tablet  0  . risperiDONE (RISPERDAL) 0.25 MG tablet Take 3 tablets (0.75 mg total) by mouth daily as needed (drop in mood).  90 tablet  2  . risperiDONE (RISPERDAL) 1 MG tablet Take 1 tablet (1 mg total) by mouth 2 (two) times daily.  60 tablet  2  . venlafaxine XR (EFFEXOR-XR) 37.5 MG 24 hr capsule Take 1 capsule (37.5 mg total) by mouth 2 (two) times daily.  60 capsule  2  . vitamin B-12 (CYANOCOBALAMIN) 1000 MCG tablet Take 1,000 mcg by mouth daily.      . Vitamin D, Ergocalciferol, (DRISDOL) 50000 UNITS CAPS capsule Take 1 capsule (50,000 Units total) by mouth every 7 (seven) days.  12 capsule  3   No current facility-administered medications for this visit.    Previous  Psychotropic  Medications:  Medication Dose   Klonopin     Lithium ?    Risperdal    Lamictal    Effexor    Trazodone    Substance Abuse History in the last 12 months: Substance Age of 1st Use Last Use Amount Specific Type  Nicotine  none        Alcohol  20  March 13      Cannabis  none        Opiates  none        Cocaine  none        Methamphetamines  none        LSD  none        Ecstasy  none         Benzodiazepines  [age]      Caffeine  childhood  this AM      Inhalants  none        Others:       sugar  childhood  this AM     Medical Consequences of Substance Abuse: none  Legal Consequences of Substance Abuse: none  Family Consequences of Substance Abuse: none  Social History: Current Place of Residence: [address] [place] [address] Place of Birth: [place], [place] Family Members: husband Marital Status:  Married Children: 1  Sons: 0  Daughters: 1 Relationships: husbandf Education:  Dentist Problems/Performance: good Religious Beliefs/Practices: [employer] History of Abuse: none Occupational Experiences: clerical, office, Architect History:  Press photographer History: none Hobbies/Interests: walking, reading, playing cards  Mental Status Examination/Evaluation: Objective:  Appearance: Casual  Eye Contact::  Good  Speech: Clear and coherent   Volume:  Normal  Mood:  neutral  Affect:  Congruent  Thought Process fairly organized today   Orientation:  Full (Time, Place, and Person)  Thought Content:  Still hears singing but feels like she is able to cope with this   Suicidal Thoughts:  No  Homicidal Thoughts:  No  Judgement:  Good  Insight:  Good  Psychomotor Activity:  Normal  Akathisia:  No  Handed:  Right  AIMS (if indicated):    Assets:  Communication Skills Desire for Improvement   Lab Results:  Results for orders placed in visit on 11/05/12 (from the past 8736 hour(s))  CBC WITH DIFFERENTIAL   Collection Time     11/05/12  1:57 PM      Result Value Ref Range   WBC 7.7  4.5 - 10.5 K/uL   RBC 4.51  3.87 - 5.11 Mil/uL   Hemoglobin 12.6  12.0 - 15.0 g/dL   HCT 38.0  36.0 - 46.0 %   MCV 84.4  78.0 - 100.0 fl   MCHC 33.2  30.0 - 36.0 g/dL   RDW 14.1  11.5 - 14.6 %   Platelets 372.0  150.0 - 400.0 K/uL   Neutrophils Relative % 73.7  43.0 - 77.0 %   Lymphocytes Relative 17.0  12.0 - 46.0 %   Monocytes Relative 7.9  3.0 - 12.0 %   Eosinophils Relative 0.8  0.0 - 5.0 %   Basophils Relative 0.6  0.0 - 3.0 %   Neutro Abs 5.7  1.4 - 7.7 K/uL   Lymphs Abs 1.3  0.7 - 4.0 K/uL   Monocytes Absolute 0.6  0.1 - 1.0 K/uL   Eosinophils Absolute 0.1  0.0 - 0.7 K/uL   Basophils Absolute 0.0  0.0 - 0.1 K/uL  TSH  Collection Time    11/05/12  1:57 PM      Result Value Ref Range   TSH 2.58  0.35 - 5.50 uIU/mL  COMPREHENSIVE METABOLIC PANEL   Collection Time    11/05/12  1:57 PM      Result Value Ref Range   Sodium 135  135 - 145 mEq/L   Potassium 4.3  3.5 - 5.1 mEq/L   Chloride 104  96 - 112 mEq/L   CO2 25  19 - 32 mEq/L   Glucose, Bld 84  70 - 99 mg/dL   BUN 11  6 - 23 mg/dL   Creatinine, Ser 1.0  0.4 - 1.2 mg/dL   Total Bilirubin 0.5  0.3 - 1.2 mg/dL   Alkaline Phosphatase 82  39 - 117 U/L   AST 9  0 - 37 U/L   ALT 6  0 - 35 U/L   Total Protein 6.4  6.0 - 8.3 g/dL   Albumin 2.9 (*) 3.5 - 5.2 g/dL   Calcium 8.8  8.4 - 10.5 mg/dL   GFR 59.18 (*) >60.00 mL/min  POCT URINALYSIS DIPSTICK   Collection Time    11/09/12  8:47 AM      Result Value Ref Range   Color, UA Yellow     Clarity, UA Cloudy     Glucose, UA Negative     Bilirubin, UA Negative     Ketones, UA Negative     Spec Grav, UA 1.015     Blood, UA Negative     pH, UA 6.5     Protein, UA Negative     Urobilinogen, UA 0.2     Nitrite, UA Negative     Leukocytes, UA Trace    URINALYSIS, MICROSCOPIC ONLY   Collection Time    11/09/12  8:58 AM      Result Value Ref Range   Squamous Epithelial / LPF NONE SEEN  RARE   Crystals Calcium  Oxalate crystals noted  NONE SEEN   Casts NONE SEEN  NONE SEEN   WBC, UA 0-2  <3 WBC/hpf   RBC / HPF 0-2  <3 RBC/hpf   Bacteria, UA FEW (*) RARE   Assessment:    AXIS I  major depression with psychotic features, rule out dementia   AXIS II Deferred  AXIS III Past Medical History  Diagnosis Date  . GERD (gastroesophageal reflux disease)     intermittent, treated with tums  . Hypothyroidism   . Scoliosis   . Hiatal hernia   . Adenomatous polyp     x1  . Lumbago   . Severe recurrent depression with psychosis   . Diverticulitis   . Personal history of kidney stones     x1  . ALLERGIC RHINITIS 12/20/2006  . Osteoporosis 2012    declined prolia  . Vitamin D deficiency   . Chronic anxiety   . Insomnia      AXIS IV economic problems and other psychosocial or environmental problems  AXIS V 61-70 mild symptoms   Treatment Plan/Recommendations:  Laboratory:    Psychotherapy: supportive  Medications: Risperdal, Lamictal, Effexor  Routine PRN Medications:  No  Consultations: none  Safety Concerns:  none  Other:     Plan/Discussion: I took her vitals.  I reviewed CC, tobacco/med/surg Hx, meds effects/ side effects, problem list, therapies and responses as well as current situation/symptoms discussed options. Continue current medications. She is doing better. She'll continue her current medications . She'll return in 3 months  See orders and pt instructions for more details.  MEDICATIONS this encounter: Meds ordered this encounter  Medications  . lamoTRIgine (LAMICTAL) 25 MG tablet    Sig: Take 2 tablets (50 mg total) by mouth 2 (two) times daily.    Dispense:  120 tablet    Refill:  2  . venlafaxine XR (EFFEXOR-XR) 37.5 MG 24 hr capsule    Sig: Take 1 capsule (37.5 mg total) by mouth 2 (two) times daily.    Dispense:  60 capsule    Refill:  2  . risperiDONE (RISPERDAL) 1 MG tablet    Sig: Take 1 tablet (1 mg total) by mouth 2 (two) times daily.    Dispense:  60 tablet     Refill:  2  . risperiDONE (RISPERDAL) 0.25 MG tablet    Sig: Take 3 tablets (0.75 mg total) by mouth daily as needed (drop in mood).    Dispense:  90 tablet    Refill:  2  . clonazePAM (KLONOPIN) 0.5 MG tablet    Sig: Take 1 tablet (0.5 mg total) by mouth at bedtime.    Dispense:  30 tablet    Refill:  2    Medical Decision Making Problem Points:  Established problem, stable/improving (1), New problem, with no additional work-up planned (3), Review of last therapy session (1) and Review of psycho-social stressors (1) Data Points:  Review or order clinical lab tests (1) Review of medication regiment & side effects (2) Review of new medications or change in dosage (2)  four-week's I certify that outpatient services furnished can reasonably be expected to improve the patient's condition.   Levonne Spiller, MD

## 2013-07-15 ENCOUNTER — Ambulatory Visit (HOSPITAL_COMMUNITY): Payer: Self-pay | Admitting: Psychiatry

## 2013-07-20 DIAGNOSIS — H7290 Unspecified perforation of tympanic membrane, unspecified ear: Secondary | ICD-10-CM | POA: Diagnosis not present

## 2013-07-20 DIAGNOSIS — H6120 Impacted cerumen, unspecified ear: Secondary | ICD-10-CM | POA: Diagnosis not present

## 2013-07-21 DIAGNOSIS — M25559 Pain in unspecified hip: Secondary | ICD-10-CM | POA: Diagnosis not present

## 2013-07-21 DIAGNOSIS — M545 Low back pain, unspecified: Secondary | ICD-10-CM | POA: Diagnosis not present

## 2013-08-11 ENCOUNTER — Ambulatory Visit: Payer: Self-pay | Admitting: Podiatry

## 2013-08-18 ENCOUNTER — Encounter: Payer: Self-pay | Admitting: Family Medicine

## 2013-08-18 ENCOUNTER — Ambulatory Visit (INDEPENDENT_AMBULATORY_CARE_PROVIDER_SITE_OTHER): Payer: Medicare Other | Admitting: Family Medicine

## 2013-08-18 VITALS — BP 122/70 | HR 82 | Temp 98.0°F | Wt 160.0 lb

## 2013-08-18 DIAGNOSIS — R059 Cough, unspecified: Secondary | ICD-10-CM

## 2013-08-18 DIAGNOSIS — R053 Chronic cough: Secondary | ICD-10-CM

## 2013-08-18 DIAGNOSIS — M549 Dorsalgia, unspecified: Secondary | ICD-10-CM | POA: Diagnosis not present

## 2013-08-18 DIAGNOSIS — R05 Cough: Secondary | ICD-10-CM | POA: Diagnosis not present

## 2013-08-18 MED ORDER — HYDROCODONE-HOMATROPINE 5-1.5 MG/5ML PO SYRP: 5.0000 mL | ORAL_SOLUTION | Freq: Two times a day (BID) | ORAL | Status: DC | PRN

## 2013-08-18 MED ORDER — ALBUTEROL SULFATE (2.5 MG/3ML) 0.083% IN NEBU
2.5000 mg | INHALATION_SOLUTION | Freq: Once | RESPIRATORY_TRACT | Status: AC
  Administered 2013-08-18: 2.5 mg via RESPIRATORY_TRACT

## 2013-08-18 NOTE — Progress Notes (Signed)
BP 122/70  Pulse 82  Temp(Src) 98 F (36.7 C) (Oral)  Wt 160 lb (72.576 kg)  SpO2 96%  LMP 07/15/2012   CC: chronic cough, hip pain  Subjective:    Patient ID: [name], female    DOB: 07-02-1933, 80 y.o.   MRN: [id number]  HPI: Issis Lindseth is a 80 y.o. female presenting on 08/18/2013 for Cough and Hip Pain   Husband going into surgery next week. Pt would like to get rid of cough. OTC cough syrup "drains her".  Takes allegra "when it rains" and notices it helps with rhinorrhea. claritin and zyrtec not effective. Takes flonase daily. Not taking omeprazole - denies GERD sxs currently. Known allergies to mold and mildew. Cough worse recently. Requests hydrocodone cough syrup which helps.  Also with R hip pain ongoing for last few weeks worse with laying in bad. Points to posterior hip. Has had xray done at [facility] - told normal.  Denies numbness or weakness down leg or shooting pain. No bowel/bladder incontinence. No fevers/chills.  See prior notes for details.  Seen 02/2013 with several week h/o cough present throughout the day that leads to gagging and incontinence. This happens regularly. Overall dry cough. Cough worsens with verbal activity and at night time. Initially thought GERD related, but didn't respond to omeprazole 40mg  daily (continues taking this). Then thought related to PNDrainage and allergic rhinitis, but has not improved on flonase daily for last several weeks. Omeprazole helped GERD sxs but did not help cough at all. flonase helped PNdrainage. Tessalon perls helped cough as well but only temporarily. She was also treated with a 10d course doxycycline to treat possible sinus infection component.  + rhinorrhea, no significant nasal congestion.  Denies fevers/chills, denies dysphagia. Has been told has asthma in past but has never been on breathing medicines. No h/o COPD or smoking.  Last visit plan: GERD treated, allergic rhinitis/PNDrainage treated. Both  have improved but cough has not.  Pt endorses h/o asthma. Will treat with albuterol inh prn, and start claritin as well.  If not improving, will ask her to return for spirometry.   Relevant past medical, surgical, family and social history reviewed and updated as indicated.  Allergies and medications reviewed and updated. Current Outpatient Prescriptions on File Prior to Visit  Medication Sig  . albuterol (PROVENTIL HFA;VENTOLIN HFA) 108 (90 BASE) MCG/ACT inhaler Inhale 2 puffs into the lungs every 6 (six) hours as needed (cough).  . benztropine (COGENTIN) 1 MG tablet Take 1 tablet (1 mg total) by mouth 2 (two) times daily.  . clonazePAM (KLONOPIN) 0.5 MG tablet Take 1 tablet (0.5 mg total) by mouth at bedtime.  . dicyclomine (BENTYL) 10 MG capsule Take 10 mg by mouth daily as needed. For diverticulitis  . fluticasone (FLONASE) 50 MCG/ACT nasal spray Place 2 sprays into both nostrils daily as needed for allergies or rhinitis.  Marland Kitchen lamoTRIgine (LAMICTAL) 25 MG tablet Take 2 tablets (50 mg total) by mouth 2 (two) times daily.  Marland Kitchen levothyroxine (SYNTHROID, LEVOTHROID) 88 MCG tablet Take 1 tablet (88 mcg total) by mouth every morning.  . risperiDONE (RISPERDAL) 0.25 MG tablet Take 3 tablets (0.75 mg total) by mouth daily as needed (drop in mood).  . risperiDONE (RISPERDAL) 1 MG tablet Take 1 tablet (1 mg total) by mouth 2 (two) times daily.  Marland Kitchen venlafaxine XR (EFFEXOR-XR) 37.5 MG 24 hr capsule Take 1 capsule (37.5 mg total) by mouth 2 (two) times daily.  . vitamin B-12 (CYANOCOBALAMIN) 1000  MCG tablet Take 1,000 mcg by mouth daily.  . Vitamin D, Ergocalciferol, (DRISDOL) 50000 UNITS CAPS capsule Take 1 capsule (50,000 Units total) by mouth every 7 (seven) days.  Marland Kitchen omeprazole (PRILOSEC) 40 MG capsule Take 40 mg by mouth daily as needed (acid reflux).   Marland Kitchen oxyCODONE-acetaminophen (PERCOCET/ROXICET) 5-325 MG per tablet Take 1-2 tablets by mouth every 4 (four) hours as needed for severe pain.   No current  facility-administered medications on file prior to visit.    Review of Systems Per HPI unless specifically indicated above    Objective:    BP 122/70  Pulse 82  Temp(Src) 98 F (36.7 C) (Oral)  Wt 160 lb (72.576 kg)  SpO2 96%  LMP 07/15/2012  Physical Exam  Nursing note and vitals reviewed. Constitutional: She appears well-developed and well-nourished. No distress.  HENT:  Mouth/Throat: Oropharynx is clear and moist. No oropharyngeal exudate.  Cardiovascular: Normal rate, regular rhythm, normal heart sounds and intact distal pulses.   No murmur heard. Pulmonary/Chest: Breath sounds normal. No respiratory distress. She has no wheezes. She has no rales.  Musculoskeletal: She exhibits no edema.  No pain midline spine No paraspinous mm tenderness Scoliosis at lumbar spine noted Neg SLR bilaterally. No pain with int/ext rotation at hip. Tender with FABER on right. No pain at GTB or sciatic notch bilaterally. Tender at R SIJ  Neurological:  5/5 strength BLE   Spirometry:  Mild restriction. Post bronchodilator test not clearly improved. pre: FVC 75%, FEV1 79%, ratio 0.77 post: FVC 74%, FEV1 86%, ratio 0.86    Assessment & Plan:   Problem List Items Addressed This Visit   Chronic cough - Primary     Off and on present since 02/2013. Not responsive to omeprazole. Improved some with allegra and flonase but persists. Pt endorses h/o asthma - so will check spirometry today. If unrevealing, discussed referral to pulm. Psychiatric history - consider psychogenic cough. Spirometry today - mild restriction. Recommend pulm referral. Pt currently too busy to see lung doc, will call me when ready for referral.    Relevant Medications      albuterol (PROVENTIL) (2.5 MG/3ML) 0.083% nebulizer solution 2.5 mg (Completed)   Other Relevant Orders      Spirometry with Graph (Completed)   BACK PAIN     R lower back pain and some lateral hip pain. Exam consistent with R sacroiliitis. rec ice  to back, provided with stretching exercises from [facility] pt advisor. Update if not improving as expected. Pt agrees.        Follow up plan: Return if symptoms worsen or fail to improve.

## 2013-08-18 NOTE — Assessment & Plan Note (Addendum)
Off and on present since 02/2013. Not responsive to omeprazole. Improved some with allegra and flonase but persists. Pt endorses h/o asthma - so will check spirometry today. If unrevealing, discussed referral to pulm. Psychiatric history - consider psychogenic cough. Spirometry today - mild restriction. Recommend pulm referral. Pt currently too busy to see lung doc, will call me when ready for referral.

## 2013-08-18 NOTE — Patient Instructions (Signed)
For cough - spirometry today. May use hydrocodone cough syrup for next week while you're taking care of husband. For back - I think you have sacroilitis. Treat with ice and start stretching exercises provided. Let us know if not improved with this.

## 2013-08-18 NOTE — Progress Notes (Signed)
Pre visit review using our clinic review tool, if applicable. No additional management support is needed unless otherwise documented below in the visit note. 

## 2013-08-18 NOTE — Assessment & Plan Note (Addendum)
R lower back pain and some lateral hip pain. Exam consistent with R sacroiliitis. rec ice to back, provided with stretching exercises from [facility] pt advisor. Update if not improving as expected. Pt agrees.

## 2013-09-03 ENCOUNTER — Other Ambulatory Visit: Payer: Self-pay | Admitting: Endocrinology

## 2013-09-06 ENCOUNTER — Telehealth: Payer: Self-pay

## 2013-09-06 ENCOUNTER — Ambulatory Visit (INDEPENDENT_AMBULATORY_CARE_PROVIDER_SITE_OTHER): Payer: Medicare Other | Admitting: Psychology

## 2013-09-06 DIAGNOSIS — F29 Unspecified psychosis not due to a substance or known physiological condition: Secondary | ICD-10-CM

## 2013-09-06 DIAGNOSIS — F5105 Insomnia due to other mental disorder: Secondary | ICD-10-CM

## 2013-09-06 DIAGNOSIS — F489 Nonpsychotic mental disorder, unspecified: Secondary | ICD-10-CM | POA: Diagnosis not present

## 2013-09-06 DIAGNOSIS — M549 Dorsalgia, unspecified: Secondary | ICD-10-CM

## 2013-09-06 DIAGNOSIS — F341 Dysthymic disorder: Secondary | ICD-10-CM | POA: Diagnosis not present

## 2013-09-06 DIAGNOSIS — F418 Other specified anxiety disorders: Secondary | ICD-10-CM

## 2013-09-06 NOTE — Telephone Encounter (Signed)
Pt left v/m; pt said Dr Darnell Level was going to make ortho referral due to hip pain when walking. Pt cannot go for appt on 09/13/13 but is available any other day for appt. I do not see where referral was placed.Please advise.

## 2013-09-06 NOTE — Telephone Encounter (Signed)
referral placed

## 2013-09-07 ENCOUNTER — Telehealth: Payer: Self-pay | Admitting: *Deleted

## 2013-09-07 ENCOUNTER — Other Ambulatory Visit: Payer: Self-pay | Admitting: Endocrinology

## 2013-09-07 DIAGNOSIS — M810 Age-related osteoporosis without current pathological fracture: Secondary | ICD-10-CM

## 2013-09-07 NOTE — Telephone Encounter (Signed)
This has been ordered previously but not scheduled. Please schedule. Also she needs to see me back in followup after this is complete along with thyroid levels

## 2013-09-07 NOTE — Telephone Encounter (Signed)
Patient would like for you to please schedule her bone density.

## 2013-09-08 NOTE — Telephone Encounter (Signed)
Scheduled for 07/29 @ 11:00 am

## 2013-09-15 ENCOUNTER — Ambulatory Visit (INDEPENDENT_AMBULATORY_CARE_PROVIDER_SITE_OTHER)
Admission: RE | Admit: 2013-09-15 | Discharge: 2013-09-15 | Disposition: A | Discharge status: Home / Self Care | Payer: Medicare Other | Source: Ambulatory Visit | Attending: Endocrinology | Admitting: Endocrinology

## 2013-09-15 DIAGNOSIS — M810 Age-related osteoporosis without current pathological fracture: Secondary | ICD-10-CM | POA: Diagnosis not present

## 2013-09-16 DIAGNOSIS — M47817 Spondylosis without myelopathy or radiculopathy, lumbosacral region: Secondary | ICD-10-CM | POA: Diagnosis not present

## 2013-09-16 DIAGNOSIS — M25559 Pain in unspecified hip: Secondary | ICD-10-CM | POA: Diagnosis not present

## 2013-09-19 NOTE — Progress Notes (Signed)
Quick Note:  Please have her come in for review of her results and thyroid functions to be checked also, does not have a future appointment ______

## 2013-09-28 ENCOUNTER — Ambulatory Visit (INDEPENDENT_AMBULATORY_CARE_PROVIDER_SITE_OTHER): Payer: Medicare Other | Admitting: Family Medicine

## 2013-09-28 ENCOUNTER — Encounter: Payer: Self-pay | Admitting: Family Medicine

## 2013-09-28 VITALS — BP 116/78 | HR 104 | Temp 97.6°F | Wt 162.0 lb

## 2013-09-28 DIAGNOSIS — J019 Acute sinusitis, unspecified: Secondary | ICD-10-CM

## 2013-09-28 DIAGNOSIS — J0100 Acute maxillary sinusitis, unspecified: Secondary | ICD-10-CM | POA: Insufficient documentation

## 2013-09-28 MED ORDER — DOXYCYCLINE HYCLATE 100 MG PO CAPS: 100.0000 mg | ORAL_CAPSULE | Freq: Two times a day (BID) | ORAL | Status: DC

## 2013-09-28 MED ORDER — HYDROCODONE-ACETAMINOPHEN 5-325 MG PO TABS: 1.0000 | ORAL_TABLET | Freq: Four times a day (QID) | ORAL | Status: DC | PRN

## 2013-09-28 NOTE — Assessment & Plan Note (Signed)
Anticipate acute sinusitis - treat with plain mucinex water and rest. Hydrocodone for breakthrough pain of L ear - attributed to sinusitis. If not improving, discussed filling abx doxy sent to pharmacy. Pt agrees with plan.

## 2013-09-28 NOTE — Progress Notes (Signed)
Pre visit review using our clinic review tool, if applicable. No additional management support is needed unless otherwise documented below in the visit note. 

## 2013-09-28 NOTE — Progress Notes (Signed)
BP 116/78  Pulse 104  Temp(Src) 97.6 F (36.4 C) (Oral)  Wt 162 lb (73.483 kg)  LMP 07/15/2012   CC: L otalgia  Subjective:    Patient ID: Jamie Pitts, female    DOB: [birth year], 80 y.o.   MRN: [id number]  HPI: Jamie Pitts is a 80 y.o. female presenting on 09/28/2013 for Otalgia   6d h/o L ear pain. Also noticing sinus pressure on left side, congestion. Unable to get anything to blow out of nose. Some facial pain and pressure.  Some cough from PNdrainage.  Some trouble hearing out of left ear. No drainage out of L ear. No recent swimming or tinnitus.  No fevers/chills. So far has tried flonase and ES tylenol which hasn't helped.  Relevant past medical, surgical, family and social history reviewed and updated as indicated.  Allergies and medications reviewed and updated. Current Outpatient Prescriptions on File Prior to Visit  Medication Sig  . albuterol (PROVENTIL HFA;VENTOLIN HFA) 108 (90 BASE) MCG/ACT inhaler Inhale 2 puffs into the lungs every 6 (six) hours as needed (cough).  . benztropine (COGENTIN) 1 MG tablet Take 1 tablet (1 mg total) by mouth 2 (two) times daily.  . clonazePAM (KLONOPIN) 0.5 MG tablet Take 1 tablet (0.5 mg total) by mouth at bedtime.  . dicyclomine (BENTYL) 10 MG capsule Take 10 mg by mouth daily as needed. For diverticulitis  . fluticasone (FLONASE) 50 MCG/ACT nasal spray Place 2 sprays into both nostrils daily as needed for allergies or rhinitis.  Marland Kitchen lamoTRIgine (LAMICTAL) 25 MG tablet Take 2 tablets (50 mg total) by mouth 2 (two) times daily.  Marland Kitchen levothyroxine (SYNTHROID, LEVOTHROID) 88 MCG tablet TAKE 1 TABLET EVERY MORNING  . risperiDONE (RISPERDAL) 0.25 MG tablet Take 3 tablets (0.75 mg total) by mouth daily as needed (drop in mood).  . risperiDONE (RISPERDAL) 1 MG tablet Take 1 tablet (1 mg total) by mouth 2 (two) times daily.  Marland Kitchen venlafaxine XR (EFFEXOR-XR) 37.5 MG 24 hr capsule Take 1 capsule (37.5 mg total) by mouth 2 (two) times daily.    . vitamin B-12 (CYANOCOBALAMIN) 1000 MCG tablet Take 1,000 mcg by mouth daily.  . Vitamin D, Ergocalciferol, (DRISDOL) 50000 UNITS CAPS capsule Take 1 capsule (50,000 Units total) by mouth every 7 (seven) days.  Marland Kitchen omeprazole (PRILOSEC) 40 MG capsule Take 40 mg by mouth daily as needed (acid reflux).   Marland Kitchen oxyCODONE-acetaminophen (PERCOCET/ROXICET) 5-325 MG per tablet Take 1-2 tablets by mouth every 4 (four) hours as needed for severe pain.   No current facility-administered medications on file prior to visit.    Review of Systems Per HPI unless specifically indicated above    Objective:    BP 116/78  Pulse 104  Temp(Src) 97.6 F (36.4 C) (Oral)  Wt 162 lb (73.483 kg)  LMP 07/15/2012  Physical Exam  Nursing note and vitals reviewed. Constitutional: She appears well-developed and well-nourished. No distress.  HENT:  Head: Normocephalic and atraumatic.  Right Ear: Hearing, tympanic membrane, external ear and ear canal normal.  Left Ear: Hearing, tympanic membrane, external ear and ear canal normal.  Nose: No mucosal edema or rhinorrhea. Right sinus exhibits no maxillary sinus tenderness and no frontal sinus tenderness. Left sinus exhibits maxillary sinus tenderness and frontal sinus tenderness.  Mouth/Throat: Uvula is midline, oropharynx is clear and moist and mucous membranes are normal. No oropharyngeal exudate, posterior oropharyngeal edema, posterior oropharyngeal erythema or tonsillar abscesses.  R canal with dry skin L TM clear Dry nasal turbinates  Eyes:  Conjunctivae and EOM are normal. Pupils are equal, round, and reactive to light. No scleral icterus.  Neck: Normal range of motion. Neck supple. No thyromegaly present.  Cardiovascular: Normal rate, regular rhythm, normal heart sounds and intact distal pulses.   No murmur heard. Pulmonary/Chest: Effort normal and breath sounds normal. No respiratory distress. She has no wheezes. She has no rales.  Lymphadenopathy:    She has  no cervical adenopathy.  Skin: Skin is warm and dry. No rash noted.       Assessment & Plan:   Problem List Items Addressed This Visit   Acute infection of nasal sinus - Primary     Anticipate acute sinusitis - treat with plain mucinex water and rest. Hydrocodone for breakthrough pain of L ear - attributed to sinusitis. If not improving, discussed filling abx doxy sent to pharmacy. Pt agrees with plan.    Relevant Medications      doxy 10d       Follow up plan: Return if symptoms worsen or fail to improve.

## 2013-09-28 NOTE — Patient Instructions (Signed)
I think you have sinusitis leading to left ear pain - treat with plain mucinex or immediate release guaifenesin with plenty of water to help mobilize mucous out. May use hydrocodone pain medicine for breakthrough ear pain If not improving in next few days, fill doxycycline antibiotic sent to pharmacy today. Let us know if fever >101 or worsening productive cough, or not improving as expected. Good to see you today, call us with questions.

## 2013-09-29 ENCOUNTER — Encounter (HOSPITAL_COMMUNITY): Payer: Self-pay | Admitting: Psychology

## 2013-09-29 NOTE — Progress Notes (Signed)
PROGRESS NOTE  Patient:   Jamie Pitts   DOB:   01-03-1934  MR Number:  [id number]  Location:  [facility] [address] [place] [address] Dept: [phone]           Date of Service:   09/06/2013  Start Time:   1 AM End Time:   2 AM  Provider/Observer:  Edgardo Roys PSYD       Billing Code/Service: [phone]  Chief Complaint:     Chief Complaint  Patient presents with  . Memory Loss  . Anxiety    Reason for Service:  The patient had been referred by Dr. Berniece Andreas and Dr. walker because of significant depression and auditory hallucinations. She been seen at [facility] because of anxiety, auditory hallucinations and depressive symptoms. While there were a number of psychosocial stressors going on particularly within her family there were concerns that there was some significant changes in her mental functioning and and there may be some need for neuropsychological testing. There was a denied any geographic disorientation but she did report experiencing word finding issues.  The patient's formal neuropsychological testing was not consistent at the time with those typically seen with even the mild Alzheimer's population. There were some indications consistent with frontal lobe impairments particularly related to problems with attention concentration, distractibility, verbal fluency and some mental status aspects. It was theorized that significant insomnia was playing a major role in these cognitive difficulties particularly with relationship to attention and concentration. To rule out early signs of cognitive decline and beginning stages of cognitive dementia a recommendation was made for repeat testing in 6-9 months. This will allow for assessment of any possible progressive changes.  During the reason clinical interview, patient reports that her husband has been critically  ill but he is getting a little better. Patient also reports that she's been having trouble with her hip and has been causing pain and reduced mobility. The patient reports that her memory is been "okay" and that is still stable. The patient reports that she has been doing significantly better with regard to the auditory hallucinations of hearing voices as well as music. She reports that they went away for the most part his she began sleeping better and that she has been much more peaceful about the situation. The patient reports that she has still maintained some sleeping difficulties and remains a major issue. She reports that she is very tired in the morning and takes a long time to get going in the morning. Current medications include risperidone and Effexor/Lamictal. She reports with these medications she does go to sleep and will stay asleep better than in the past. She reports that she still is having some trouble falling asleep. The patient reports that she has been coping better with taking care of her husband and helping with a new grandson.    Current Status:  The patient reports that she feels like her cognitive status is been fairly stable recently and that there've been no significant indications of progressive decline.  Reliability of Information: Information provided by the patient as well as review of available medical records.  Behavioral Observation: Jamie Pitts  presents as a 80 y.o.-year-old Right Caucasian Female who appeared her stated age. her dress was Appropriate and she was Well Groomed and her manners were Appropriate to the situation.  There were not any physical disabilities noted.  she displayed an appropriate level  of cooperation and motivation.    Interactions:    Active   Attention:   There were some indications of internal distraction from preoccupations.  Memory:   within normal limits  Visuo-spatial:   within normal limits  Speech  (Volume):  normal  Speech:   normal pitch and normal volume  Thought Process:  Coherent  Though Content:  WNL  Orientation:   person, place, time/date and situation  Judgment:   Fair  Planning:   Fair  Affect:    Anxious  Mood:    Anxious  Insight:   Good  Intelligence:   normal   Medical History:   Past Medical History  Diagnosis Date  . GERD (gastroesophageal reflux disease)     intermittent, treated with tums  . Hypothyroidism   . Scoliosis   . Hiatal hernia   . Adenomatous polyp     x1  . Lumbago   . Severe recurrent depression with psychosis   . Diverticulitis   . Personal history of kidney stones     x1  . ALLERGIC RHINITIS 12/20/2006  . Osteoporosis 2007    declined prolia  . Vitamin D deficiency   . Chronic anxiety   . Insomnia         Outpatient Encounter Prescriptions as of 09/06/2013  Medication Sig  . albuterol (PROVENTIL HFA;VENTOLIN HFA) 108 (90 BASE) MCG/ACT inhaler Inhale 2 puffs into the lungs every 6 (six) hours as needed (cough).  . benztropine (COGENTIN) 1 MG tablet Take 1 tablet (1 mg total) by mouth 2 (two) times daily.  . clonazePAM (KLONOPIN) 0.5 MG tablet Take 1 tablet (0.5 mg total) by mouth at bedtime.  . dicyclomine (BENTYL) 10 MG capsule Take 10 mg by mouth daily as needed. For diverticulitis  . fluticasone (FLONASE) 50 MCG/ACT nasal spray Place 2 sprays into both nostrils daily as needed for allergies or rhinitis.  Marland Kitchen lamoTRIgine (LAMICTAL) 25 MG tablet Take 2 tablets (50 mg total) by mouth 2 (two) times daily.  Marland Kitchen levothyroxine (SYNTHROID, LEVOTHROID) 88 MCG tablet TAKE 1 TABLET EVERY MORNING  . omeprazole (PRILOSEC) 40 MG capsule Take 40 mg by mouth daily as needed (acid reflux).   Marland Kitchen oxyCODONE-acetaminophen (PERCOCET/ROXICET) 5-325 MG per tablet Take 1-2 tablets by mouth every 4 (four) hours as needed for severe pain.  Marland Kitchen risperiDONE (RISPERDAL) 0.25 MG tablet Take 3 tablets (0.75 mg total) by mouth daily as needed (drop in mood).   . risperiDONE (RISPERDAL) 1 MG tablet Take 1 tablet (1 mg total) by mouth 2 (two) times daily.  Marland Kitchen venlafaxine XR (EFFEXOR-XR) 37.5 MG 24 hr capsule Take 1 capsule (37.5 mg total) by mouth 2 (two) times daily.  . vitamin B-12 (CYANOCOBALAMIN) 1000 MCG tablet Take 1,000 mcg by mouth daily.  . Vitamin D, Ergocalciferol, (DRISDOL) 50000 UNITS CAPS capsule Take 1 capsule (50,000 Units total) by mouth every 7 (seven) days.  . [DISCONTINUED] HYDROcodone-homatropine (HYCODAN) 5-1.5 MG/5ML syrup Take 5 mLs by mouth 2 (two) times daily as needed for cough (sedation precautions).          Sexual History:   History  Sexual Activity  . Sexual Activity: Not on file     Family Med/Psych History:  Family History  Problem Relation Age of Onset  . Schizophrenia Sister   . Alcohol abuse Neg Hx   . Bipolar disorder Neg Hx   . Dementia Neg Hx   . Drug abuse Neg Hx   . OCD Neg Hx   . Paranoid  behavior Neg Hx   . Seizures Neg Hx   . Sexual abuse Neg Hx   . Physical abuse Neg Hx   . Anxiety disorder Grandchild   . Depression Grandchild   . Cancer Sister     lung  . Healthy Sister   . Diabetes Mother   . Diabetes Father   . Diabetes Sister   . Diabetes Brother   . Cancer Sister     kidney    Risk of Suicide/Violence: virtually non-existent   Impression/DX:  There does appear to be a great deal of stability in the patient's functioning but we will due a repeat CERAD batte to confirm this for direct comparison to previous testing.   Disposition/Plan:  Schedule for neuropsychological testing   Diagnosis:    Axis I:  Psychosis, unspecified psychosis type  Dysthymic disorder  Insomnia secondary to depression with anxiety    Tetsuo Coppola R, PsyD 09/29/2013

## 2013-10-04 ENCOUNTER — Encounter (HOSPITAL_COMMUNITY): Payer: Self-pay | Admitting: Psychology

## 2013-10-04 ENCOUNTER — Ambulatory Visit (INDEPENDENT_AMBULATORY_CARE_PROVIDER_SITE_OTHER): Payer: Medicare Other | Admitting: Psychology

## 2013-10-04 DIAGNOSIS — F341 Dysthymic disorder: Secondary | ICD-10-CM

## 2013-10-04 DIAGNOSIS — F489 Nonpsychotic mental disorder, unspecified: Secondary | ICD-10-CM

## 2013-10-04 DIAGNOSIS — F5105 Insomnia due to other mental disorder: Secondary | ICD-10-CM

## 2013-10-04 DIAGNOSIS — F418 Other specified anxiety disorders: Secondary | ICD-10-CM

## 2013-10-04 DIAGNOSIS — F29 Unspecified psychosis not due to a substance or known physiological condition: Secondary | ICD-10-CM | POA: Diagnosis not present

## 2013-10-04 NOTE — Progress Notes (Signed)
The patient completed a repeat neuropsychological testing session today.  The CERAD battery was repeated to make direct comparisons to the previous testing completed on 12/22/2012.  Below the test results from the 12/22/2012 testing and todays (10/04/2013) testing.   GENERAL INTELLECTUAL FUNCTIONING:  The patient was initially administered the CERAD Neuropsychological Battery on 12/22/2012 and again 10/04/2013.  Below are the patient's scores in relationship to normative data and mild/moderate Senile Dementia of the Alzheimer's Type.      VF Name MMSE Const [facility] Mrs. Volkov: 12/22/2012:  11 14 22 11 12 5 9  10/04/2013:  19 14 30 11 20 7 10    Normal Age Matched 20 14.6 28.9 10.1 21.1 7.2 9.6  Mild SDAT:  8.8 11.8 20 7.8 8.8 0.9 4.8   Moderate SDAT 6.8 10.5 16.1 6.5 6.3 0.4 3.9   On the current CERAD battery, which is a battery of measures shown to be sensitive to various forms of dementia, the patient's performance continued to be quite good and not consistent with any forms of degenerative dementias.  It was clearly not consistent with the mild Alzheimer's population group.  In fact, the patient's performance today was signficantly improved over the first testing session with marked improvement on verbal fluency, MMSE, and immediate and delayed memory tasks.  The patient reports that she has improved her sleep pattern since our first testing and she also reports that auditory hallucinations have almost completely stopped.  The patient's performance on the Mini-Mental State Exam indicates that she was oriented to the year, date, month and season as well as the location of the testing and the examiner.  She was  able to immediately recall 3 objects and was able to recall all three after a brief period of delay. She was able to spell world forwards and backwards.  There were no significant aphasic or apraxic difficulties noted.   She was able to carry out a brief series of verbal  instructions.  Her performance on the constructional praxis test, a test sensitive to cortically based neurological deficits, was  efficient without any significant indications of impairment for planning, visual spatial abilities, and fine motor coordination.  The patient's performance on the Verbal Fluency Test was well within normative expectations and consistent with healthy peers.    Her ability to name pictures of common and uncommon objects as measured by the [facility] Naming Test was  within normal limits with no indication of visual-spatial disturbance or an inability to name common items or any indication of visual neglect.   The patient's verbal learning and ability to encode new information was much improved and consistant with the normal age matched control group.  She performed significantly higher than  individuals in the Alzheimer's population. For example she recalled 20 of the possible 30 words during the initial wordless memory tasks. This is right at the same level for the number that the normative group recalls.  On the Word list recall, which is a free recall format she recalled 7 of the 10 words which is consistnet with the normative control group and significantly above the mild Alzheimer's population group. When this word list challenges changed to a recognition format she recalled 10 of the possible 10 words correctly and had no omissions. Clearly, she is now showing improved memory functioning over the first testing.    Overall, this performance indicates that the patient has displayed improvement over the first testing.  She reports improved sleep patterns and a reduction in  auditory hallucinations.    Summery and Conclusions:  The patient's performance is not consistent with those typically by even the mild Alzheimer's population. In fact, the patient shows marked improvement from the fist testing 9 months ago, further pointing out inconsistencies with any forms of  degenerative dementias.  The patient performed consistent the the age-matched normative comparison group on all measured today and better than her first testing on the multiple memory tasks, verbal fluency, and mental status measures.  It does appear that sleep issues were playing a significant roll in her difficulties.  The the patient does report some mild memory issues related to free recall and remembering the context and situation where she learned something, this appears to be part of normal aging symptoms and not related to any types of dementia processes.

## 2013-10-06 ENCOUNTER — Other Ambulatory Visit (HOSPITAL_COMMUNITY): Payer: Self-pay | Admitting: Psychiatry

## 2013-10-08 ENCOUNTER — Ambulatory Visit (INDEPENDENT_AMBULATORY_CARE_PROVIDER_SITE_OTHER): Payer: Medicare Other | Admitting: Psychiatry

## 2013-10-08 ENCOUNTER — Encounter (HOSPITAL_COMMUNITY): Payer: Self-pay | Admitting: Psychiatry

## 2013-10-08 VITALS — BP 104/67 | HR 76 | Ht 65.0 in | Wt 161.8 lb

## 2013-10-08 DIAGNOSIS — F323 Major depressive disorder, single episode, severe with psychotic features: Secondary | ICD-10-CM

## 2013-10-08 DIAGNOSIS — F341 Dysthymic disorder: Secondary | ICD-10-CM

## 2013-10-08 DIAGNOSIS — F29 Unspecified psychosis not due to a substance or known physiological condition: Secondary | ICD-10-CM

## 2013-10-08 MED ORDER — VENLAFAXINE HCL ER 37.5 MG PO CP24: 37.5000 mg | ORAL_CAPSULE | Freq: Two times a day (BID) | ORAL | Status: DC

## 2013-10-08 MED ORDER — CLONAZEPAM 0.5 MG PO TABS: 0.5000 mg | ORAL_TABLET | Freq: Every day | ORAL | Status: DC

## 2013-10-08 MED ORDER — LAMOTRIGINE 25 MG PO TABS: 50.0000 mg | ORAL_TABLET | Freq: Two times a day (BID) | ORAL | Status: DC

## 2013-10-08 MED ORDER — RISPERIDONE 1 MG PO TABS: 1.0000 mg | ORAL_TABLET | Freq: Two times a day (BID) | ORAL | Status: DC

## 2013-10-08 NOTE — Progress Notes (Signed)
Patient ID: Jamie Pitts, female   DOB: 01/07/1934, 80 y.o.   MRN: [id number] Patient ID: Jamie Pitts, female   DOB: 1933/06/27, 80 y.o.   MRN: [id number] Patient ID: Jamie Pitts, female   DOB: 1934/02/11, 80 y.o.   MRN: [id number] Patient ID: Jamie Pitts, female   DOB: October 12, 1933, 80 y.o.   MRN: [id number] Patient ID: Jamie Pitts, female   DOB: Jul 25, 1933, 80 y.o.   MRN: [id number] [facility] [id number] Progress Note Jamie Pitts MRN: [id number] DOB: 07-27-1933 Age: 80 y.o.  Date: 10/08/2013 Start Time: 10:20 AM End Time: 10:50 AM  Chief Complaint: Chief Complaint  Patient presents with  . Anxiety  . Depression  . Follow-up   Subjective: "I'm not hearing voices."   This patient is a 80 year old married white female who lives with her husband in [place]. She has one daughter and 3 grandchildren. She is retired from the [employer].  Apparently the patient had some sort of psychotic episode in 2013. She was admitted to [facility] because she was hearing voices at home. The voices got so bad that she cannot put a gun under the bed and wanted to shoot him. The patient is a poor historian and is hard of hearing and gets easily confused. She was placed on various medicines in the hospital which have been continued. It's unclear if she is medication compliant. For example she's not taking the Cogentin. She repeats herself a lot.  The patient states she still hearing voices every day. They're not frightening her like they were before but there seemed to her and she can't always get them to stop. She sleeping pretty well she denies being depressed but she is obviously not thinking clearly. She mentions that she's been diagnosed with parkinsonism but she doesn't have a tremor.  The patient returns after 3 months. She claims she is doing fairly well. She no longer hears voices. Her mood has been stable. She is caring for her 9 month old grandson. She denies any  side effects    History of Chief Complaint:   At age 42 pt went to a "fat farm" to fatten her up.  She remembers her father paid more attention to her than her mother did.  She experienced some anxiety when she moved [place].  She was in [employer] for 2 years, then she was the Glass blower/designer for [employer] and then got into [employer].  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  She noted feeling very home sick to go back to [place] her home place in 2010.  Her sister had been very nervous and she was beginning to feel anxious and was started on Klonopin.  ISince then she noted songs for 3 or 4 years.  Then in October of 2013, she started hearing voices non stop and she lost 30 pounds. This prompted her to get a gun and put it under the bed and the family got concerned.  She was admitted to [facility] on January 24th.  She was stopped from the Klonopin and Lithium (pt doesn't understand how she got on that) and placed on Ripserdal, Trazodone, Lamictal, and Effexor with pretty good results, except she has very dry mouth at nigh  She has noted blurred vision since starting the meds.  She is getting new glasses for herself.  Anxiety Symptoms include confusion, decreased concentration, dry mouth and nervous/anxious behavior. Patient reports no dizziness or suicidal ideas.     Review of Systems  HENT: Negative.   Gastrointestinal: Negative.   Genitourinary: Negative.   Neurological: Positive for weakness and light-headedness. Negative for dizziness, tremors, seizures, syncope, facial asymmetry, speech difficulty, numbness and headaches.       Lightheaded if gets up too fast.  Psychiatric/Behavioral: Positive for confusion, dysphoric mood and decreased concentration. Negative for suicidal ideas, hallucinations, behavioral problems, sleep disturbance, self-injury and agitation. The patient is nervous/anxious. The patient is not hyperactive.    Physical Exam  Depressive Symptoms: none  now  (Hypo) Manic Symptoms:   None  Anxiety Symptoms: Excessive Worry:  Yes Panic Symptoms:  No Agoraphobia:  No Obsessive Compulsive: Yes  Symptoms: ordliness Specific Phobias:  Yes Social Anxiety:  No  Psychotic Symptoms:  None  PTSD Symptoms: Ever had a traumatic exposure:  Yes Had a traumatic exposure in the last month:  No No other features  Traumatic Brain Injury: No   Past Psychiatric History: Diagnosis: Depression  Hospitalizations: once [facility]  Outpatient Care: PCP  Substance Abuse Care: none  Self-Mutilation: none  Suicidal Attempts: none  Violent Behaviors: one episode of getting a gun, but now sees that that is not the way to handle her voices    Allergies: Allergies  Allergen Reactions  . Amoxicillin Hives    Sores in mouth  . Morphine And Related Itching    Can tolerate if necessary  . Sulfonamide Derivatives Hives    Sores in mouth  . Valium [Diazepam] Itching    Can tolerate if necessary   Past Medical History:   Past Medical History  Diagnosis Date  . GERD (gastroesophageal reflux disease)     intermittent, treated with tums  . Hypothyroidism   . Scoliosis   . Hiatal hernia   . Adenomatous polyp     x1  . Lumbago   . Severe recurrent depression with psychosis   . Diverticulitis   . Personal history of kidney stones     x1  . ALLERGIC RHINITIS 12/20/2006  . Osteoporosis 2007    declined prolia  . Vitamin D deficiency   . Chronic anxiety   . Insomnia    Surgical History: Past Surgical History  Procedure Laterality Date  . Appendectomy  1954  . Cholecystectomy    . Abdominal hysterectomy  1979    ectopic pregnancy with one ovary removed  . Tonsillectomy    . Oophorectomy Right 1965    Ectopic pregnancy, removal of right ovary and tube- 1960  . Esophagogastroduodenoscopy    . Rotator cuff repair Right   . Knee surgery Left 2012    L medial meniscal tear   History of Loss of Consciousness:  No Seizure History:  No Cardiac  History:  No Allergies: Allergies  Allergen Reactions  . Amoxicillin Hives    Sores in mouth  . Morphine And Related Itching    Can tolerate if necessary  . Sulfonamide Derivatives Hives    Sores in mouth  . Valium [Diazepam] Itching    Can tolerate if necessary   Medical History: Past Medical History  Diagnosis Date  . GERD (gastroesophageal reflux disease)     intermittent, treated with tums  . Hypothyroidism   . Scoliosis   . Hiatal hernia   . Adenomatous polyp     x1  . Lumbago   . Severe recurrent depression with psychosis   . Diverticulitis   . Personal history of kidney stones     x1  . ALLERGIC RHINITIS 12/20/2006  . Osteoporosis 2007  declined prolia  . Vitamin D deficiency   . Chronic anxiety   . Insomnia    Surgical History: Past Surgical History  Procedure Laterality Date  . Appendectomy  1954  . Cholecystectomy    . Abdominal hysterectomy  1979    ectopic pregnancy with one ovary removed  . Tonsillectomy    . Oophorectomy Right 1965    Ectopic pregnancy, removal of right ovary and tube- 1960  . Esophagogastroduodenoscopy    . Rotator cuff repair Right   . Knee surgery Left 2012    L medial meniscal tear   Family History: family history includes Anxiety disorder in her grandchild; Cancer in her sister and sister; Depression in her grandchild; Diabetes in her brother, father, mother, and sister; Healthy in her sister; Schizophrenia in her sister. There is no history of Alcohol abuse, Bipolar disorder, Dementia, Drug abuse, OCD, Paranoid behavior, Seizures, Sexual abuse, or Physical abuse. Reviewed and no changes noted.  Current Medications:  Current Outpatient Prescriptions  Medication Sig Dispense Refill  . albuterol (PROVENTIL HFA;VENTOLIN HFA) 108 (90 BASE) MCG/ACT inhaler Inhale 2 puffs into the lungs every 6 (six) hours as needed (cough).  1 Inhaler  3  . clonazePAM (KLONOPIN) 0.5 MG tablet Take 1 tablet (0.5 mg total) by mouth at bedtime.   30 tablet  2  . dicyclomine (BENTYL) 10 MG capsule Take 10 mg by mouth daily as needed. For diverticulitis      . doxycycline (VIBRAMYCIN) 100 MG capsule Take 1 capsule (100 mg total) by mouth 2 (two) times daily.  20 capsule  0  . fluticasone (FLONASE) 50 MCG/ACT nasal spray Place 2 sprays into both nostrils daily as needed for allergies or rhinitis.      Marland Kitchen HYDROcodone-acetaminophen (NORCO/VICODIN) 5-325 MG per tablet Take 1 tablet by mouth every 6 (six) hours as needed for moderate pain or severe pain.  20 tablet  0  . lamoTRIgine (LAMICTAL) 25 MG tablet Take 2 tablets (50 mg total) by mouth 2 (two) times daily.  120 tablet  2  . levothyroxine (SYNTHROID, LEVOTHROID) 88 MCG tablet TAKE 1 TABLET EVERY MORNING  90 tablet  0  . risperiDONE (RISPERDAL) 0.25 MG tablet Take 3 tablets (0.75 mg total) by mouth daily as needed (drop in mood).  90 tablet  2  . risperiDONE (RISPERDAL) 1 MG tablet Take 1 tablet (1 mg total) by mouth 2 (two) times daily.  60 tablet  2  . venlafaxine XR (EFFEXOR-XR) 37.5 MG 24 hr capsule Take 1 capsule (37.5 mg total) by mouth 2 (two) times daily.  60 capsule  2  . Vitamin D, Ergocalciferol, (DRISDOL) 50000 UNITS CAPS capsule Take 1 capsule (50,000 Units total) by mouth every 7 (seven) days.  12 capsule  3   No current facility-administered medications for this visit.    Previous Psychotropic Medications:  Medication Dose   Klonopin     Lithium ?    Risperdal    Lamictal    Effexor    Trazodone    Substance Abuse History in the last 12 months: Substance Age of 1st Use Last Use Amount Specific Type  Nicotine  none        Alcohol  20  March 13      Cannabis  none        Opiates  none        Cocaine  none        Methamphetamines  none  LSD  none        Ecstasy  none         Benzodiazepines  77  78      Caffeine  childhood  this AM      Inhalants  none        Others:       sugar  childhood  this AM     Medical Consequences of Substance Abuse:  none  Legal Consequences of Substance Abuse: none  Family Consequences of Substance Abuse: none  Social History: Current Place of Residence: [address] [place] [address] Place of Birth: [place], [place] Family Members: husband Marital Status:  Married Children: 1  Sons: 0  Daughters: 1 Relationships: husbandf Education:  Dentist Problems/Performance: good Religious Beliefs/Practices: [employer] History of Abuse: none Occupational Experiences: clerical, office, Architect History:  Press photographer History: none Hobbies/Interests: walking, reading, playing cards  Mental Status Examination/Evaluation: Objective:  Appearance: Casual  Eye Contact::  Good  Speech: Clear and coherent   Volume:  Normal  Mood:  neutral  Affect:  Congruent  Thought Process fairly organized today   Thought content: no further hallucinations    Suicidal Thoughts:  No  Homicidal Thoughts:  No  Judgement:  Good  Insight:  Good  Psychomotor Activity:  Normal  Akathisia:  No  Handed:  Right  AIMS (if indicated):    Assets:  Communication Skills Desire for Improvement   Lab Results:  Results for orders placed in visit on 11/05/12 (from the past 8736 hour(s))  CBC WITH DIFFERENTIAL   Collection Time    11/05/12  1:57 PM      Result Value Ref Range   WBC 7.7  4.5 - 10.5 K/uL   RBC 4.51  3.87 - 5.11 Mil/uL   Hemoglobin 12.6  12.0 - 15.0 g/dL   HCT 38.0  36.0 - 46.0 %   MCV 84.4  78.0 - 100.0 fl   MCHC 33.2  30.0 - 36.0 g/dL   RDW 14.1  11.5 - 14.6 %   Platelets 372.0  150.0 - 400.0 K/uL   Neutrophils Relative % 73.7  43.0 - 77.0 %   Lymphocytes Relative 17.0  12.0 - 46.0 %   Monocytes Relative 7.9  3.0 - 12.0 %   Eosinophils Relative 0.8  0.0 - 5.0 %   Basophils Relative 0.6  0.0 - 3.0 %   Neutro Abs 5.7  1.4 - 7.7 K/uL   Lymphs Abs 1.3  0.7 - 4.0 K/uL   Monocytes Absolute 0.6  0.1 - 1.0 K/uL   Eosinophils Absolute 0.1  0.0 - 0.7 K/uL   Basophils  Absolute 0.0  0.0 - 0.1 K/uL  TSH   Collection Time    11/05/12  1:57 PM      Result Value Ref Range   TSH 2.58  0.35 - 5.50 uIU/mL  COMPREHENSIVE METABOLIC PANEL   Collection Time    11/05/12  1:57 PM      Result Value Ref Range   Sodium 135  135 - 145 mEq/L   Potassium 4.3  3.5 - 5.1 mEq/L   Chloride 104  96 - 112 mEq/L   CO2 25  19 - 32 mEq/L   Glucose, Bld 84  70 - 99 mg/dL   BUN 11  6 - 23 mg/dL   Creatinine, Ser 1.0  0.4 - 1.2 mg/dL   Total Bilirubin 0.5  0.3 - 1.2 mg/dL   Alkaline Phosphatase  82  39 - 117 U/L   AST 9  0 - 37 U/L   ALT 6  0 - 35 U/L   Total Protein 6.4  6.0 - 8.3 g/dL   Albumin 2.9 (*) 3.5 - 5.2 g/dL   Calcium 8.8  8.4 - 10.5 mg/dL   GFR 59.18 (*) >60.00 mL/min  POCT URINALYSIS DIPSTICK   Collection Time    11/09/12  8:47 AM      Result Value Ref Range   Color, UA Yellow     Clarity, UA Cloudy     Glucose, UA Negative     Bilirubin, UA Negative     Ketones, UA Negative     Spec Grav, UA 1.015     Blood, UA Negative     pH, UA 6.5     Protein, UA Negative     Urobilinogen, UA 0.2     Nitrite, UA Negative     Leukocytes, UA Trace    URINALYSIS, MICROSCOPIC ONLY   Collection Time    11/09/12  8:58 AM      Result Value Ref Range   Squamous Epithelial / LPF NONE SEEN  RARE   Crystals Calcium Oxalate crystals noted  NONE SEEN   Casts NONE SEEN  NONE SEEN   WBC, UA 0-2  <3 WBC/hpf   RBC / HPF 0-2  <3 RBC/hpf   Bacteria, UA FEW (*) RARE   Assessment:    AXIS I  major depression with psychotic features, rule out dementia   AXIS II Deferred  AXIS III Past Medical History  Diagnosis Date  . GERD (gastroesophageal reflux disease)     intermittent, treated with tums  . Hypothyroidism   . Scoliosis   . Hiatal hernia   . Adenomatous polyp     x1  . Lumbago   . Severe recurrent depression with psychosis   . Diverticulitis   . Personal history of kidney stones     x1  . ALLERGIC RHINITIS 12/20/2006  . Osteoporosis 2007    declined prolia   . Vitamin D deficiency   . Chronic anxiety   . Insomnia      AXIS IV economic problems and other psychosocial or environmental problems  AXIS V 61-70 mild symptoms   Treatment Plan/Recommendations:  Laboratory:    Psychotherapy: supportive  Medications: Risperdal, Lamictal, Effexor  Routine PRN Medications:  No  Consultations: none  Safety Concerns:  none  Other:     Plan/Discussion: I took her vitals.  I reviewed CC, tobacco/med/surg Hx, meds effects/ side effects, problem list, therapies and responses as well as current situation/symptoms discussed options. Continue current medications. She is doing better. She'll continue her current medications . She'll return in 3 months See orders and pt instructions for more details.  MEDICATIONS this encounter: Meds ordered this encounter  Medications  . DISCONTD: venlafaxine XR (EFFEXOR-XR) 37.5 MG 24 hr capsule    Sig: Take 37.5 mg by mouth 2 (two) times daily as needed.  . lamoTRIgine (LAMICTAL) 25 MG tablet    Sig: Take 2 tablets (50 mg total) by mouth 2 (two) times daily.    Dispense:  120 tablet    Refill:  2  . risperiDONE (RISPERDAL) 1 MG tablet    Sig: Take 1 tablet (1 mg total) by mouth 2 (two) times daily.    Dispense:  60 tablet    Refill:  2  . venlafaxine XR (EFFEXOR-XR) 37.5 MG 24 hr capsule    Sig:  Take 1 capsule (37.5 mg total) by mouth 2 (two) times daily.    Dispense:  60 capsule    Refill:  2  . clonazePAM (KLONOPIN) 0.5 MG tablet    Sig: Take 1 tablet (0.5 mg total) by mouth at bedtime.    Dispense:  30 tablet    Refill:  2    Medical Decision Making Problem Points:  Established problem, stable/improving (1), New problem, with no additional work-up planned (3), Review of last therapy session (1) and Review of psycho-social stressors (1) Data Points:  Review or order clinical lab tests (1) Review of medication regiment & side effects (2) Review of new medications or change in dosage (2)  four-week's I  certify that outpatient services furnished can reasonably be expected to improve the patient's condition.   Levonne Spiller, MD

## 2013-10-12 DIAGNOSIS — H6120 Impacted cerumen, unspecified ear: Secondary | ICD-10-CM | POA: Diagnosis not present

## 2013-10-12 DIAGNOSIS — H7290 Unspecified perforation of tympanic membrane, unspecified ear: Secondary | ICD-10-CM | POA: Diagnosis not present

## 2013-10-13 ENCOUNTER — Ambulatory Visit (INDEPENDENT_AMBULATORY_CARE_PROVIDER_SITE_OTHER): Payer: Medicare Other | Admitting: Endocrinology

## 2013-10-13 ENCOUNTER — Encounter: Payer: Self-pay | Admitting: Endocrinology

## 2013-10-13 VITALS — BP 95/69 | HR 87 | Temp 98.2°F | Resp 16 | Ht 65.0 in | Wt 164.6 lb

## 2013-10-13 DIAGNOSIS — E039 Hypothyroidism, unspecified: Secondary | ICD-10-CM

## 2013-10-13 DIAGNOSIS — M8580 Other specified disorders of bone density and structure, unspecified site: Secondary | ICD-10-CM

## 2013-10-13 DIAGNOSIS — M949 Disorder of cartilage, unspecified: Secondary | ICD-10-CM

## 2013-10-13 DIAGNOSIS — M899 Disorder of bone, unspecified: Secondary | ICD-10-CM | POA: Diagnosis not present

## 2013-10-13 NOTE — Progress Notes (Signed)
Patient ID: Jamie Pitts, female   DOB: 11-30-1933, 80 y.o.   MRN: [id number]    Reason for Appointment:  followup visit    History of Present Illness:   OSTEOPOROSIS:  She has had asymptomatic osteoporosis, diagnosed probably in 1997  At that time she was treated with bisphosphonates, probably Fosamax for about 7 years  Was taking Fosamax in 2006 and Actonel in 2007, not clear how long she continued Actonel She also has had vitamin D deficiency and has been recommended vitamin D by prescription since at least 2013 Initially she was irregular with this because of reported side effects of constipation but now she is taking this weekly  She had a T score of -2.8 in 2007 She does not think she has had another bone density until recently and the lowest T score is -1.9 at the spine, normal at the hip Currently does not have any height loss or new back pain She is concerned that she needs more treatment   HYPOTHYROIDISM: This was first diagnosed in 1980 This was diagnosed when she had symptoms of fatigue and hair loss No history of goiter Has been fairly compliant with followup for monitoring and last visit was almost a year ago  Currently she has no unusual fatigue. She does have chronic cold sensitivity          She has been on Synthroid and has been on 88 mcg for quite sometime.          Compliance with the medical regimen has been as prescribed with taking the tablet in the morning before breakfast. Labs pending  Lab Results  Component Value Date   TSH 2.58 11/05/2012   TSH 0.674 03/14/2012          Medication List       This list is accurate as of: 10/13/13  3:56 PM.  Always use your most recent med list.               albuterol 108 (90 BASE) MCG/ACT inhaler  Commonly known as:  PROVENTIL HFA;VENTOLIN HFA  Inhale 2 puffs into the lungs every 6 (six) hours as needed (cough).     clonazePAM 0.5 MG tablet  Commonly known as:  KLONOPIN  Take 1 tablet (0.5 mg total)  by mouth at bedtime.     dicyclomine 10 MG capsule  Commonly known as:  BENTYL  Take 10 mg by mouth daily as needed. For diverticulitis     doxycycline 100 MG capsule  Commonly known as:  VIBRAMYCIN  Take 1 capsule (100 mg total) by mouth 2 (two) times daily.     fluticasone 50 MCG/ACT nasal spray  Commonly known as:  FLONASE  Place 2 sprays into both nostrils daily as needed for allergies or rhinitis.     HYDROcodone-acetaminophen 5-325 MG per tablet  Commonly known as:  NORCO/VICODIN  Take 1 tablet by mouth every 6 (six) hours as needed for moderate pain or severe pain.     lamoTRIgine 25 MG tablet  Commonly known as:  LAMICTAL  Take 2 tablets (50 mg total) by mouth 2 (two) times daily.     levothyroxine 88 MCG tablet  Commonly known as:  SYNTHROID, LEVOTHROID  TAKE 1 TABLET EVERY MORNING     risperiDONE 0.25 MG tablet  Commonly known as:  RISPERDAL  Take 3 tablets (0.75 mg total) by mouth daily as needed (drop in mood).     risperiDONE 1 MG tablet  Commonly known as:  RISPERDAL  Take 1 tablet (1 mg total) by mouth 2 (two) times daily.     venlafaxine XR 37.5 MG 24 hr capsule  Commonly known as:  EFFEXOR-XR  Take 1 capsule (37.5 mg total) by mouth 2 (two) times daily.     Vitamin D (Ergocalciferol) 50000 UNITS Caps capsule  Commonly known as:  DRISDOL  Take 1 capsule (50,000 Units total) by mouth every 7 (seven) days.        Allergies:  Allergies  Allergen Reactions  . Amoxicillin Hives    Sores in mouth  . Morphine And Related Itching    Can tolerate if necessary  . Sulfonamide Derivatives Hives    Sores in mouth  . Valium [Diazepam] Itching    Can tolerate if necessary    Past Medical History  Diagnosis Date  . GERD (gastroesophageal reflux disease)     intermittent, treated with tums  . Hypothyroidism   . Scoliosis   . Hiatal hernia   . Adenomatous polyp     x1  . Lumbago   . Severe recurrent depression with psychosis   . Diverticulitis   .  Personal history of kidney stones     x1  . ALLERGIC RHINITIS 12/20/2006  . Osteoporosis 2007    declined prolia  . Vitamin D deficiency   . Chronic anxiety   . Insomnia     Past Surgical History  Procedure Laterality Date  . Appendectomy  1954  . Cholecystectomy    . Abdominal hysterectomy  1979    ectopic pregnancy with one ovary removed  . Tonsillectomy    . Oophorectomy Right 1965    Ectopic pregnancy, removal of right ovary and tube- 1960  . Esophagogastroduodenoscopy    . Rotator cuff repair Right   . Knee surgery Left 2012    L medial meniscal tear    Family History  Problem Relation Age of Onset  . Schizophrenia Sister   . Alcohol abuse Neg Hx   . Bipolar disorder Neg Hx   . Dementia Neg Hx   . Drug abuse Neg Hx   . OCD Neg Hx   . Paranoid behavior Neg Hx   . Seizures Neg Hx   . Sexual abuse Neg Hx   . Physical abuse Neg Hx   . Anxiety disorder Grandchild   . Depression Grandchild   . Cancer Sister     lung  . Healthy Sister   . Diabetes Mother   . Diabetes Father   . Diabetes Sister   . Diabetes Brother   . Cancer Sister     kidney    Social History:  reports that she has never smoked. She has never used smokeless tobacco. She reports that she does not drink alcohol or use illicit drugs.  REVIEW Of SYSTEMS:  She has had chronic depression   Examination:   BP 95/69  Pulse 87  Temp(Src) 98.2 F (36.8 C)  Resp 16  Ht 5\' 5"  (1.651 m)  Wt 164 lb 9.6 oz (74.662 kg)  BMI 27.39 kg/m2  SpO2 95%  LMP 07/15/2012   GENERAL APPEARANCE: Alert And looks well.  No puffiness of face, hands or ankles               NEUROLOGIC EXAM:  biceps reflexes show normal relaxation    Assessments/Treatment:  Hypothyroidism, primary and without goiter She has had a very long history of hypothyroidism and is currently compliant with her regimen of 88 mcg Will need  to check her thyroid levels today again  OSTEOPOROSIS:   She had a T score of -2.8 in 2007 and this  appears to be improved with T score now -1.9 at the spine Explained to patient that since her T score is better and she has had 8-9 years of treatment with bisphosphonates previously do not need any treatment at this time  She does need to be monitored for her vitamin D supplementation  She can continue calcium daily   Emilio Baylock 10/13/2013, 3:56 PM

## 2013-10-14 ENCOUNTER — Other Ambulatory Visit (INDEPENDENT_AMBULATORY_CARE_PROVIDER_SITE_OTHER): Payer: Medicare Other

## 2013-10-14 DIAGNOSIS — E039 Hypothyroidism, unspecified: Secondary | ICD-10-CM | POA: Diagnosis not present

## 2013-10-14 LAB — VITAMIN D 25 HYDROXY (VIT D DEFICIENCY, FRACTURES): VITD: 54.6 ng/mL (ref 30.00–100.00)

## 2013-10-14 LAB — T4, FREE: Free T4: 1.29 ng/dL (ref 0.60–1.60)

## 2013-10-14 LAB — TSH: TSH: 0.3 u[IU]/mL — ABNORMAL LOW (ref 0.35–4.50)

## 2013-10-14 NOTE — Progress Notes (Signed)
Quick Note:  Please let patient know that the lab result is above average and she can reduce her vitamin D to twice a month, try taking on the first and 15th of the month ______

## 2013-10-14 NOTE — Progress Notes (Signed)
Quick Note:  Please let patient know that the thyroid result is slightly high and she needs to reduce her dose by half a pill a week. Needs to have thyroid levels checked again in 2 months ______

## 2013-10-18 DIAGNOSIS — L723 Sebaceous cyst: Secondary | ICD-10-CM | POA: Diagnosis not present

## 2013-10-18 DIAGNOSIS — L821 Other seborrheic keratosis: Secondary | ICD-10-CM | POA: Diagnosis not present

## 2013-10-18 DIAGNOSIS — D485 Neoplasm of uncertain behavior of skin: Secondary | ICD-10-CM | POA: Diagnosis not present

## 2013-10-26 ENCOUNTER — Ambulatory Visit (INDEPENDENT_AMBULATORY_CARE_PROVIDER_SITE_OTHER): Payer: Medicare Other | Admitting: Psychology

## 2013-10-26 ENCOUNTER — Encounter (HOSPITAL_COMMUNITY): Payer: Self-pay | Admitting: Psychology

## 2013-10-26 DIAGNOSIS — F29 Unspecified psychosis not due to a substance or known physiological condition: Secondary | ICD-10-CM | POA: Diagnosis not present

## 2013-10-26 DIAGNOSIS — F489 Nonpsychotic mental disorder, unspecified: Secondary | ICD-10-CM | POA: Diagnosis not present

## 2013-10-26 DIAGNOSIS — F5105 Insomnia due to other mental disorder: Secondary | ICD-10-CM | POA: Diagnosis not present

## 2013-10-26 DIAGNOSIS — F418 Other specified anxiety disorders: Secondary | ICD-10-CM

## 2013-10-26 DIAGNOSIS — F341 Dysthymic disorder: Secondary | ICD-10-CM

## 2013-10-26 NOTE — Progress Notes (Signed)
Today I provided feedback regarding the results of recent testing.  Below you will find those results.  The patient is doing better, but continues to deal with mild depression and sleep issues.  THe sleep has improved but still could get better.  She is eating better as well.  Results were good, with no indication of progressive dementia.   GENERAL INTELLECTUAL FUNCTIONING:  The patient was initially administered the CERAD Neuropsychological Battery on 12/22/2012 and again 10/04/2013.  Below are the patient's scores in relationship to normative data and mild/moderate Senile Dementia of the Alzheimer's Type.      VF Name MMSE Const [facility] Mrs. Dizdarevic: 12/22/2012:  11 14 22 11 12 5 9  10/04/2013:  19 14 30 11 20 7 10    Normal Age Matched 20 14.6 28.9 10.1 21.1 7.2 9.6  Mild SDAT:  8.8 11.8 20 7.8 8.8 0.9 4.8   Moderate SDAT 6.8 10.5 16.1 6.5 6.3 0.4 3.9   On the current CERAD battery, which is a battery of measures shown to be sensitive to various forms of dementia, the patient's performance continued to be quite good and not consistent with any forms of degenerative dementias.  It was clearly not consistent with the mild Alzheimer's population group.  In fact, the patient's performance today was signficantly improved over the first testing session with marked improvement on verbal fluency, MMSE, and immediate and delayed memory tasks.  The patient reports that she has improved her sleep pattern since our first testing and she also reports that auditory hallucinations have almost completely stopped.  The patient's performance on the Mini-Mental State Exam indicates that she was oriented to the year, date, month and season as well as the location of the testing and the examiner.  She was  able to immediately recall 3 objects and was able to recall all three after a brief period of delay. She was able to spell world forwards and backwards.  There were no significant aphasic or apraxic  difficulties noted.   She was able to carry out a brief series of verbal instructions.  Her performance on the constructional praxis test, a test sensitive to cortically based neurological deficits, was  efficient without any significant indications of impairment for planning, visual spatial abilities, and fine motor coordination.  The patient's performance on the Verbal Fluency Test was well within normative expectations and consistent with healthy peers.    Her ability to name pictures of common and uncommon objects as measured by the [facility] Naming Test was  within normal limits with no indication of visual-spatial disturbance or an inability to name common items or any indication of visual neglect.   The patient's verbal learning and ability to encode new information was much improved and consistant with the normal age matched control group.  She performed significantly higher than  individuals in the Alzheimer's population. For example she recalled 20 of the possible 30 words during the initial wordless memory tasks. This is right at the same level for the number that the normative group recalls.  On the Word list recall, which is a free recall format she recalled 7 of the 10 words which is consistnet with the normative control group and significantly above the mild Alzheimer's population group. When this word list challenges changed to a recognition format she recalled 10 of the possible 10 words correctly and had no omissions. Clearly, she is now showing improved memory functioning over the first testing.    Overall, this performance  indicates that the patient has displayed improvement over the first testing.  She reports improved sleep patterns and a reduction in auditory hallucinations.    [place] and Conclusions:  The patient's performance is not consistent with those typically by even the mild Alzheimer's population. In fact, the patient shows marked improvement from the fist testing 9  months ago, further pointing out inconsistencies with any forms of degenerative dementias.  The patient performed consistent the the age-matched normative comparison group on all measured today and better than her first testing on the multiple memory tasks, verbal fluency, and mental status measures.  It does appear that sleep issues were playing a significant roll in her difficulties.  The the patient does report some mild memory issues related to free recall and remembering the context and situation where she learned something, this appears to be part of normal aging symptoms and not related to any types of dementia processes.

## 2013-11-10 ENCOUNTER — Encounter: Payer: Self-pay | Admitting: Endocrinology

## 2013-11-10 ENCOUNTER — Ambulatory Visit (INDEPENDENT_AMBULATORY_CARE_PROVIDER_SITE_OTHER): Payer: Medicare Other | Admitting: Endocrinology

## 2013-11-10 VITALS — BP 118/62 | HR 90 | Temp 98.6°F | Resp 12 | Wt 163.0 lb

## 2013-11-10 DIAGNOSIS — M949 Disorder of cartilage, unspecified: Secondary | ICD-10-CM

## 2013-11-10 DIAGNOSIS — Z23 Encounter for immunization: Secondary | ICD-10-CM | POA: Diagnosis not present

## 2013-11-10 DIAGNOSIS — M899 Disorder of bone, unspecified: Secondary | ICD-10-CM | POA: Diagnosis not present

## 2013-11-10 DIAGNOSIS — E039 Hypothyroidism, unspecified: Secondary | ICD-10-CM

## 2013-11-10 DIAGNOSIS — M8580 Other specified disorders of bone density and structure, unspecified site: Secondary | ICD-10-CM

## 2013-11-10 LAB — BASIC METABOLIC PANEL
BUN: 12 mg/dL (ref 6–23)
CALCIUM: 9.4 mg/dL (ref 8.4–10.5)
CHLORIDE: 101 meq/L (ref 96–112)
CO2: 26 mEq/L (ref 19–32)
CREATININE: 1.2 mg/dL (ref 0.4–1.2)
GFR: 47.07 mL/min — ABNORMAL LOW (ref >60.00)
Glucose, Bld: 92 mg/dL (ref 70–99)
Potassium: 4.1 mEq/L (ref 3.5–5.1)
Sodium: 134 mEq/L — ABNORMAL LOW (ref 135–145)

## 2013-11-10 LAB — T4, FREE: Free T4: 1.26 ng/dL (ref 0.60–1.60)

## 2013-11-10 LAB — TSH: TSH: 0.49 u[IU]/mL (ref 0.35–4.50)

## 2013-11-10 NOTE — Progress Notes (Signed)
Quick Note:  Please let patient know that the lab result is normal and no further action needed ______ 

## 2013-11-10 NOTE — Progress Notes (Signed)
Patient ID: Jamie Pitts, female   DOB: 23-May-1933, 80 y.o.   MRN: [id number]    Reason for Appointment:  followup visit    History of Present Illness:   HYPOTHYROIDISM: This was first diagnosed in 1980 This was diagnosed when she had symptoms of fatigue and hair loss No history of goiter Currently she has no unusual fatigue. She does have chronic cold sensitivity          She has been on generic Synthroid and has been on 88 mcg for quite sometime.   She was changed to the generic preparation by her [place] even though she was on brand-name previously Since her TSH was low normal she was told to take 6-1/2 tablets a week and she is doing this for about a month          Compliance with the medical regimen has been as prescribed with taking the tablet in the morning before breakfast.  Labs from today are pending  Lab Results  Component Value Date   FREET4 1.29 10/14/2013   TSH 0.30* 10/14/2013   TSH 2.58 11/05/2012   TSH 0.674 03/14/2012    OSTEOPOROSIS:  She has had asymptomatic osteoporosis, diagnosed probably in 1997  At that time she was treated with bisphosphonates, probably Fosamax for about 7 years  Was taking Fosamax in 2006 and Actonel in 2007, not clear how long she continued Actonel She also has had vitamin D deficiency and has been recommended vitamin D by prescription since at least 2013 Initially she was irregular with this because of reported side effects of constipation but more recently she is taking this regularly Because of her level being in the 50s she was told to switch to every other week but she is still doing it every week  She had a T score of -2.8 in 2007 She  had another bone density recently and the lowest T score is -1.9 at the spine, normal at the hip Currently does not have any height loss or new back pain She is off her Fosamax and Actonel       Medication List       This list is accurate as of: 11/10/13 11:01 AM.  Always use  your most recent med list.               albuterol 108 (90 BASE) MCG/ACT inhaler  Commonly known as:  PROVENTIL HFA;VENTOLIN HFA  Inhale 2 puffs into the lungs every 6 (six) hours as needed (cough).     clonazePAM 0.5 MG tablet  Commonly known as:  KLONOPIN  Take 1 tablet (0.5 mg total) by mouth at bedtime.     dicyclomine 10 MG capsule  Commonly known as:  BENTYL  Take 10 mg by mouth daily as needed. For diverticulitis     doxycycline 100 MG capsule  Commonly known as:  VIBRAMYCIN  Take 1 capsule (100 mg total) by mouth 2 (two) times daily.     fluticasone 50 MCG/ACT nasal spray  Commonly known as:  FLONASE  Place 2 sprays into both nostrils daily as needed for allergies or rhinitis.     HYDROcodone-acetaminophen 5-325 MG per tablet  Commonly known as:  NORCO/VICODIN  Take 1 tablet by mouth every 6 (six) hours as needed for moderate pain or severe pain.     lamoTRIgine 25 MG tablet  Commonly known as:  LAMICTAL  Take 2 tablets (50 mg total) by mouth 2 (two) times daily.  levothyroxine 88 MCG tablet  Commonly known as:  SYNTHROID, LEVOTHROID  TAKE 1 TABLET EVERY MORNING     risperiDONE 0.25 MG tablet  Commonly known as:  RISPERDAL  Take 3 tablets (0.75 mg total) by mouth daily as needed (drop in mood).     risperiDONE 1 MG tablet  Commonly known as:  RISPERDAL  Take 1 tablet (1 mg total) by mouth 2 (two) times daily.     venlafaxine XR 37.5 MG 24 hr capsule  Commonly known as:  EFFEXOR-XR  Take 1 capsule (37.5 mg total) by mouth 2 (two) times daily.     Vitamin D (Ergocalciferol) 50000 UNITS Caps capsule  Commonly known as:  DRISDOL  Take 1 capsule (50,000 Units total) by mouth every 7 (seven) days.        Allergies:  Allergies  Allergen Reactions  . Amoxicillin Hives    Sores in mouth  . Morphine And Related Itching    Can tolerate if necessary  . Sulfonamide Derivatives Hives    Sores in mouth  . Valium [Diazepam] Itching    Can tolerate if  necessary    Past Medical History  Diagnosis Date  . GERD (gastroesophageal reflux disease)     intermittent, treated with tums  . Hypothyroidism   . Scoliosis   . Hiatal hernia   . Adenomatous polyp     x1  . Lumbago   . Severe recurrent depression with psychosis   . Diverticulitis   . Personal history of kidney stones     x1  . ALLERGIC RHINITIS 12/20/2006  . Osteoporosis 2007    declined prolia  . Vitamin D deficiency   . Chronic anxiety   . Insomnia     Past Surgical History  Procedure Laterality Date  . Appendectomy  1954  . Cholecystectomy    . Abdominal hysterectomy  1979    ectopic pregnancy with one ovary removed  . Tonsillectomy    . Oophorectomy Right 1965    Ectopic pregnancy, removal of right ovary and tube- 1960  . Esophagogastroduodenoscopy    . Rotator cuff repair Right   . Knee surgery Left 2012    L medial meniscal tear    Family History  Problem Relation Age of Onset  . Schizophrenia Sister   . Alcohol abuse Neg Hx   . Bipolar disorder Neg Hx   . Dementia Neg Hx   . Drug abuse Neg Hx   . OCD Neg Hx   . Paranoid behavior Neg Hx   . Seizures Neg Hx   . Sexual abuse Neg Hx   . Physical abuse Neg Hx   . Anxiety disorder Grandchild   . Depression Grandchild   . Cancer Sister     lung  . Healthy Sister   . Diabetes Mother   . Diabetes Father   . Diabetes Sister   . Diabetes Brother   . Cancer Sister     kidney    Social History:  reports that she has never smoked. She has never used smokeless tobacco. She reports that she does not drink alcohol or use illicit drugs.  REVIEW Of SYSTEMS:  She has had chronic depression   Examination:   BP 118/62  Pulse 90  Temp(Src) 98.6 F (37 C) (Oral)  Resp 12  Wt 163 lb (73.936 kg)  SpO2 95%  LMP 07/15/2012   GENERAL APPEARANCE: Alert And looks well.  No puffiness of face, hands or ankles  Assessments/Treatment:  Hypothyroidism, primary and without goiter She has had  a  long history of hypothyroidism and is currently compliant with her regimen of generic 88 mcg 6-1/2 tablets a week now Will need to check her thyroid levels today again  OSTEOPOROSIS:   This has improved and is now in the osteopenic range She will reduce other week She can continue calcium daily  Influenza vaccine given   Jamie Pitts 11/10/2013, 11:01 AM

## 2013-11-10 NOTE — Patient Instructions (Signed)
Vitamin D, take every other Sunday

## 2013-11-14 ENCOUNTER — Other Ambulatory Visit: Payer: Self-pay | Admitting: Endocrinology

## 2013-12-27 ENCOUNTER — Ambulatory Visit (INDEPENDENT_AMBULATORY_CARE_PROVIDER_SITE_OTHER): Payer: Medicare Other | Admitting: Family Medicine

## 2013-12-27 ENCOUNTER — Encounter: Payer: Self-pay | Admitting: Family Medicine

## 2013-12-27 ENCOUNTER — Other Ambulatory Visit: Payer: Self-pay | Admitting: *Deleted

## 2013-12-27 VITALS — BP 118/64 | HR 80 | Temp 97.5°F | Wt 164.5 lb

## 2013-12-27 DIAGNOSIS — R05 Cough: Secondary | ICD-10-CM

## 2013-12-27 DIAGNOSIS — Z23 Encounter for immunization: Secondary | ICD-10-CM | POA: Diagnosis not present

## 2013-12-27 DIAGNOSIS — R053 Chronic cough: Secondary | ICD-10-CM

## 2013-12-27 MED ORDER — MONTELUKAST SODIUM 10 MG PO TABS: 10.0000 mg | ORAL_TABLET | Freq: Every day | ORAL | Status: DC

## 2013-12-27 MED ORDER — TRAMADOL HCL 50 MG PO TABS: 25.0000 mg | ORAL_TABLET | Freq: Two times a day (BID) | ORAL | Status: DC | PRN

## 2013-12-27 MED ORDER — VITAMIN D (ERGOCALCIFEROL) 1.25 MG (50000 UNIT) PO CAPS: 50000.0000 [IU] | ORAL_CAPSULE | ORAL | Status: DC
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
## 2013-12-27 NOTE — Addendum Note (Signed)
Addended by: Royann Shivers A on: 12/27/2013 03:47 PM   Modules accepted: Orders

## 2013-12-27 NOTE — Progress Notes (Signed)
Pre visit review using our clinic review tool, if applicable. No additional management support is needed unless otherwise documented below in the visit note. 

## 2013-12-27 NOTE — Assessment & Plan Note (Signed)
Exam benign. Recent spirometry with mild restriction.  Anticipate bronchial irritation leading to chronic cough. Treat with tramadol prn cough. Will also add spiriva for possible allergic rhinitis contribution. Continue allegra and flonase.

## 2013-12-27 NOTE — Patient Instructions (Addendum)
I think this cough is still some reactive airways and bronchial irritation. Treat with tramadol as needed for cough, and treat with singulair once daily for possible allergy contribution. Let me know how cough is doing. Tdap today.

## 2014-01-07 ENCOUNTER — Encounter (HOSPITAL_COMMUNITY): Payer: Self-pay | Admitting: Psychiatry

## 2014-01-07 ENCOUNTER — Ambulatory Visit (INDEPENDENT_AMBULATORY_CARE_PROVIDER_SITE_OTHER): Payer: Medicare Other | Admitting: Psychiatry

## 2014-01-07 VITALS — BP 115/70 | HR 75 | Ht 65.0 in | Wt 166.4 lb

## 2014-01-07 DIAGNOSIS — F323 Major depressive disorder, single episode, severe with psychotic features: Secondary | ICD-10-CM

## 2014-01-07 DIAGNOSIS — F341 Dysthymic disorder: Secondary | ICD-10-CM

## 2014-01-07 DIAGNOSIS — F29 Unspecified psychosis not due to a substance or known physiological condition: Secondary | ICD-10-CM

## 2014-01-07 MED ORDER — CLONAZEPAM 0.5 MG PO TABS: 0.5000 mg | ORAL_TABLET | Freq: Every day | ORAL | Status: DC

## 2014-01-07 MED ORDER — BENZTROPINE MESYLATE 0.5 MG PO TABS: 0.5000 mg | ORAL_TABLET | Freq: Three times a day (TID) | ORAL | Status: DC | PRN

## 2014-01-07 MED ORDER — VENLAFAXINE HCL ER 75 MG PO CP24: 75.0000 mg | ORAL_CAPSULE | Freq: Every day | ORAL | Status: DC

## 2014-01-07 MED ORDER — LAMOTRIGINE 25 MG PO TABS: 50.0000 mg | ORAL_TABLET | Freq: Two times a day (BID) | ORAL | Status: DC

## 2014-01-07 MED ORDER — RISPERIDONE 1 MG PO TABS
1.0000 mg | ORAL_TABLET | Freq: Two times a day (BID) | ORAL | Status: DC
Start: 2014-01-07 — End: 2014-05-09

## 2014-01-07 MED ORDER — RISPERIDONE 0.25 MG PO TABS: 0.7500 mg | ORAL_TABLET | Freq: Every day | ORAL | Status: DC | PRN

## 2014-01-07 NOTE — Progress Notes (Signed)
Patient ID: Jamie Pitts, female   DOB: May 14, 1933, 80 y.o.   MRN: [id number] Patient ID: Jamie Pitts, female   DOB: Jun 27, 1933, 80 y.o.   MRN: [id number] Patient ID: Jamie Pitts, female   DOB: [birth year], 80 y.o.   MRN: [id number] Patient ID: Jamie Pitts, female   DOB: 1933/11/10, 80 y.o.   MRN: [id number] Patient ID: Jamie Pitts, female   DOB: 07-24-1933, 80 y.o.   MRN: [id number] Patient ID: Jamie Pitts, female   DOB: 1933/03/14, 80 y.o.   MRN: [id number] [facility] [id number] Progress Note Jamie Pitts MRN: [id number] DOB: 1933/08/08 Age: 80 y.o.  Date: 01/07/2014 Start Time: 10:20 AM End Time: 10:50 AM  Chief Complaint: Chief Complaint  Patient presents with  . Hallucinations  . Follow-up   Subjective: "I'm hearing music "   This patient is a 80 year old married white female who lives with her husband in [place]. She has one daughter and 3 grandchildren. She is retired from the [employer].  Apparently the patient had some sort of psychotic episode in 2013. She was admitted to [facility] because she was hearing voices at home. The voices got so bad that she cannot put a gun under the bed and wanted to shoot him. The patient is a poor historian and is hard of hearing and gets easily confused. She was placed on various medicines in the hospital which have been continued. It's unclear if she is medication compliant. For example she's not taking the Cogentin. She repeats herself a lot.  The patient states she still hearing voices every day. They're not frightening her like they were before but there seemed to her and she can't always get them to stop. She sleeping pretty well she denies being depressed but she is obviously not thinking clearly. She mentions that she's been diagnosed with parkinsonism but she doesn't have a tremor.  The patient returns after 3 months. She states that last night she began hearing music with some voices attached to it  again and it really scared her. They did not see anything frightening but it makes her feel uncomfortable. She is compliant with all her medications and overall she feels are helping. She's not hearing anything today and as long as she keeps busy and watches TV they go away. She's not significantly depressed. She claims she is medication compliant. She is not suicidal    History of Chief Complaint:   At age 11 pt went to a "fat farm" to fatten her up.  She remembers her father paid more attention to her than her mother did.  She experienced some anxiety when she moved [place].  She was in [employer] for 2 years, then she was the Glass blower/designer for [employer] and then got into [employer].  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  She noted feeling very home sick to go back to [place] her home place in 2010.  Her sister had been very nervous and she was beginning to feel anxious and was started on Klonopin.  ISince then she noted songs for 3 or 4 years.  Then in October of 2013, she started hearing voices non stop and she lost 30 pounds. This prompted her to get a gun and put it under the bed and the family got concerned.  She was admitted to [facility] on January 24th.  She was stopped from the Klonopin and Lithium (pt doesn't understand how she got on that) and placed on Ripserdal, Trazodone,  Lamictal, and Effexor with pretty good results, except she has very dry mouth at nigh  She has noted blurred vision since starting the meds.  She is getting new glasses for herself.  Anxiety Symptoms include confusion, decreased concentration and nervous/anxious behavior. Patient reports no dizziness or suicidal ideas.     Review of Systems  HENT: Negative.   Gastrointestinal: Negative.   Genitourinary: Negative.   Neurological: Positive for weakness and light-headedness. Negative for dizziness, tremors, seizures, syncope, facial asymmetry, speech difficulty, numbness and headaches.        Lightheaded if gets up too fast.  Psychiatric/Behavioral: Positive for confusion, dysphoric mood and decreased concentration. Negative for suicidal ideas, hallucinations, behavioral problems, sleep disturbance, self-injury and agitation. The patient is nervous/anxious. The patient is not hyperactive.    Physical Exam  Depressive Symptoms: none now  (Hypo) Manic Symptoms:   None  Anxiety Symptoms: Excessive Worry:  Yes Panic Symptoms:  No Agoraphobia:  No Obsessive Compulsive: Yes  Symptoms: ordliness Specific Phobias:  Yes Social Anxiety:  No  Psychotic Symptoms:  None  PTSD Symptoms: Ever had a traumatic exposure:  Yes Had a traumatic exposure in the last month:  No No other features  Traumatic Brain Injury: No   Past Psychiatric History: Diagnosis: Depression  Hospitalizations: once [facility]  Outpatient Care: PCP  Substance Abuse Care: none  Self-Mutilation: none  Suicidal Attempts: none  Violent Behaviors: one episode of getting a gun, but now sees that that is not the way to handle her voices    Allergies: Allergies  Allergen Reactions  . Amoxicillin Hives    Sores in mouth  . Morphine And Related Itching    Can tolerate if necessary  . Sulfonamide Derivatives Hives    Sores in mouth  . Valium [Diazepam] Itching    Can tolerate if necessary   Past Medical History:   Past Medical History  Diagnosis Date  . GERD (gastroesophageal reflux disease)     intermittent, treated with tums  . Hypothyroidism   . Scoliosis   . Hiatal hernia   . Adenomatous polyp     x1  . Lumbago   . Severe recurrent depression with psychosis   . Diverticulitis   . Personal history of kidney stones     x1  . ALLERGIC RHINITIS 12/20/2006  . Osteoporosis 2007    declined prolia  . Vitamin D deficiency   . Chronic anxiety   . Insomnia    Surgical History: Past Surgical History  Procedure Laterality Date  . Appendectomy  1954  . Cholecystectomy    . Abdominal hysterectomy   1979    ectopic pregnancy with one ovary removed  . Tonsillectomy    . Oophorectomy Right 1965    Ectopic pregnancy, removal of right ovary and tube- 1960  . Esophagogastroduodenoscopy    . Rotator cuff repair Right   . Knee surgery Left 2012    L medial meniscal tear   History of Loss of Consciousness:  No Seizure History:  No Cardiac History:  No Allergies: Allergies  Allergen Reactions  . Amoxicillin Hives    Sores in mouth  . Morphine And Related Itching    Can tolerate if necessary  . Sulfonamide Derivatives Hives    Sores in mouth  . Valium [Diazepam] Itching    Can tolerate if necessary   Medical History: Past Medical History  Diagnosis Date  . GERD (gastroesophageal reflux disease)     intermittent, treated with tums  . Hypothyroidism   .  Scoliosis   . Hiatal hernia   . Adenomatous polyp     x1  . Lumbago   . Severe recurrent depression with psychosis   . Diverticulitis   . Personal history of kidney stones     x1  . ALLERGIC RHINITIS 12/20/2006  . Osteoporosis 2007    declined prolia  . Vitamin D deficiency   . Chronic anxiety   . Insomnia    Surgical History: Past Surgical History  Procedure Laterality Date  . Appendectomy  1954  . Cholecystectomy    . Abdominal hysterectomy  1979    ectopic pregnancy with one ovary removed  . Tonsillectomy    . Oophorectomy Right 1965    Ectopic pregnancy, removal of right ovary and tube- 1960  . Esophagogastroduodenoscopy    . Rotator cuff repair Right   . Knee surgery Left 2012    L medial meniscal tear   Family History: family history includes Anxiety disorder in her grandchild; Cancer in her sister and sister; Depression in her grandchild; Diabetes in her brother, father, mother, and sister; Healthy in her sister; Schizophrenia in her sister. There is no history of Alcohol abuse, Bipolar disorder, Dementia, Drug abuse, OCD, Paranoid behavior, Seizures, Sexual abuse, or Physical abuse. Reviewed and no  changes noted.  Current Medications:  Current Outpatient Prescriptions  Medication Sig Dispense Refill  . albuterol (PROVENTIL HFA;VENTOLIN HFA) 108 (90 BASE) MCG/ACT inhaler Inhale 2 puffs into the lungs every 6 (six) hours as needed (cough). 1 Inhaler 3  . clonazePAM (KLONOPIN) 0.5 MG tablet Take 1 tablet (0.5 mg total) by mouth at bedtime. 90 tablet 2  . dicyclomine (BENTYL) 10 MG capsule Take 10 mg by mouth daily as needed. For diverticulitis    . fluticasone (FLONASE) 50 MCG/ACT nasal spray Place 2 sprays into both nostrils daily as needed for allergies or rhinitis.    Marland Kitchen lamoTRIgine (LAMICTAL) 25 MG tablet Take 2 tablets (50 mg total) by mouth 2 (two) times daily. 360 tablet 2  . levothyroxine (SYNTHROID, LEVOTHROID) 88 MCG tablet TAKE 1 TABLET EVERY MORNING (ONE REFILL ONLY, PATIENT NEEDS OFFICE VISIT) 90 tablet 1  . montelukast (SINGULAIR) 10 MG tablet Take 1 tablet (10 mg total) by mouth at bedtime. 30 tablet 3  . risperiDONE (RISPERDAL) 0.25 MG tablet Take 3 tablets (0.75 mg total) by mouth daily as needed (drop in mood). 270 tablet 2  . risperiDONE (RISPERDAL) 1 MG tablet Take 1 tablet (1 mg total) by mouth 2 (two) times daily. 180 tablet 2  . traMADol (ULTRAM) 50 MG tablet Take 0.5-1 tablets (25-50 mg total) by mouth 2 (two) times daily as needed (cough). 30 tablet 0  . Vitamin D, Ergocalciferol, (DRISDOL) 50000 UNITS CAPS capsule Take 1 capsule (50,000 Units total) by mouth every 7 (seven) days. 12 capsule 3  . benztropine (COGENTIN) 0.5 MG tablet Take 1 tablet (0.5 mg total) by mouth 3 (three) times daily as needed for tremors. 180 tablet 2  . venlafaxine XR (EFFEXOR XR) 75 MG 24 hr capsule Take 1 capsule (75 mg total) by mouth daily. 90 capsule 2   No current facility-administered medications for this visit.    Previous Psychotropic Medications:  Medication Dose   Klonopin     Lithium ?    Risperdal    Lamictal    Effexor    Trazodone    Substance Abuse History in the  last 12 months: Substance Age of 1st Use Last Use Amount Specific Type  Nicotine  none        Alcohol  20  March 13      Cannabis  none        Opiates  none        Cocaine  none        Methamphetamines  none        LSD  none        Ecstasy  none         Benzodiazepines  [age]      Caffeine  childhood  this AM      Inhalants  none        Others:       sugar  childhood  this AM     Medical Consequences of Substance Abuse: none  Legal Consequences of Substance Abuse: none  Family Consequences of Substance Abuse: none  Social History: Current Place of Residence: [address] [place] [address] Place of Birth: [place], [place] Family Members: husband Marital Status:  Married Children: 1  Sons: 0  Daughters: 1 Relationships: husbandf Education:  Dentist Problems/Performance: good Religious Beliefs/Practices: [employer] History of Abuse: none Occupational Experiences: clerical, office, Architect History:  Press photographer History: none Hobbies/Interests: walking, reading, playing cards  Mental Status Examination/Evaluation: Objective:  Appearance: Casual appears tired, claims she didn't sleep well last night   Eye Contact::  Good  Speech: Clear and coherent   Volume:  Normal  Mood:  neutral  Affect:  Congruent  Thought Process fairly organized today   Thought content: Reports recent auditory hallucinations     Suicidal Thoughts:  No  Homicidal Thoughts:  No  Judgement:  Good  Insight:  Good  Psychomotor Activity:  Normal  Akathisia:  No  Handed:  Right  AIMS (if indicated):    Assets:  Communication Skills Desire for Improvement   Lab Results:  Results for orders placed or performed in visit on 11/10/13 (from the past 8736 hour(s))  Basic metabolic panel   Collection Time: 11/10/13 11:24 AM  Result Value Ref Range   Sodium 134 (L) 135 - 145 mEq/L   Potassium 4.1 3.5 - 5.1 mEq/L   Chloride 101 96 - 112 mEq/L   CO2 26  19 - 32 mEq/L   Glucose, Bld 92 70 - 99 mg/dL   BUN 12 6 - 23 mg/dL   Creatinine, Ser 1.2 0.4 - 1.2 mg/dL   Calcium 9.4 8.4 - 10.5 mg/dL   GFR 47.07 (L) >60.00 mL/min  TSH   Collection Time: 11/10/13 11:24 AM  Result Value Ref Range   TSH 0.49 0.35 - 4.50 uIU/mL  T4, free   Collection Time: 11/10/13 11:24 AM  Result Value Ref Range   Free T4 1.26 0.60 - 1.60 ng/dL  Results for orders placed or performed in visit on 10/14/13 (from the past 8736 hour(s))  TSH   Collection Time: 10/14/13  9:28 AM  Result Value Ref Range   TSH 0.30 (L) 0.35 - 4.50 uIU/mL  T4, free   Collection Time: 10/14/13  9:28 AM  Result Value Ref Range   Free T4 1.29 0.60 - 1.60 ng/dL  Results for orders placed or performed in visit on 10/13/13 (from the past 8736 hour(s))  Vit D  25 hydroxy (rtn osteoporosis monitoring)   Collection Time: 10/13/13  4:17 PM  Result Value Ref Range   VITD 54.60 30.00 - 100.00 ng/mL   Assessment:    AXIS I  major depression with  psychotic features, rule out dementia   AXIS II Deferred  AXIS III Past Medical History  Diagnosis Date  . GERD (gastroesophageal reflux disease)     intermittent, treated with tums  . Hypothyroidism   . Scoliosis   . Hiatal hernia   . Adenomatous polyp     x1  . Lumbago   . Severe recurrent depression with psychosis   . Diverticulitis   . Personal history of kidney stones     x1  . ALLERGIC RHINITIS 12/20/2006  . Osteoporosis 2007    declined prolia  . Vitamin D deficiency   . Chronic anxiety   . Insomnia      AXIS IV economic problems and other psychosocial or environmental problems  AXIS V 61-70 mild symptoms   Treatment Plan/Recommendations:  Laboratory:    Psychotherapy: supportive  Medications: Risperdal, Lamictal, Effexor  Routine PRN Medications:  No  Consultations: none  Safety Concerns:  none  Other:     Plan/Discussion: I took her vitals.  I reviewed CC, tobacco/med/surg Hx, meds effects/ side effects, problem  list, therapies and responses as well as current situation/symptoms discussed options. Continue current medications. She has had one recent episode of auditory hallucinations. I would like her to continue her medications and see if this resolves or gets worse. If it gets worse she is to call me immediately. . She'll return in 2 months See orders and pt instructions for more details.  MEDICATIONS this encounter: Meds ordered this encounter  Medications  . venlafaxine XR (EFFEXOR XR) 75 MG 24 hr capsule    Sig: Take 1 capsule (75 mg total) by mouth daily.    Dispense:  90 capsule    Refill:  2  . risperiDONE (RISPERDAL) 1 MG tablet    Sig: Take 1 tablet (1 mg total) by mouth 2 (two) times daily.    Dispense:  180 tablet    Refill:  2  . risperiDONE (RISPERDAL) 0.25 MG tablet    Sig: Take 3 tablets (0.75 mg total) by mouth daily as needed (drop in mood).    Dispense:  270 tablet    Refill:  2  . lamoTRIgine (LAMICTAL) 25 MG tablet    Sig: Take 2 tablets (50 mg total) by mouth 2 (two) times daily.    Dispense:  360 tablet    Refill:  2  . benztropine (COGENTIN) 0.5 MG tablet    Sig: Take 1 tablet (0.5 mg total) by mouth 3 (three) times daily as needed for tremors.    Dispense:  180 tablet    Refill:  2  . clonazePAM (KLONOPIN) 0.5 MG tablet    Sig: Take 1 tablet (0.5 mg total) by mouth at bedtime.    Dispense:  90 tablet    Refill:  2    Medical Decision Making Problem Points:  Established problem, stable/improving (1), New problem, with no additional work-up planned (3), Review of last therapy session (1) and Review of psycho-social stressors (1) Data Points:  Review or order clinical lab tests (1) Review of medication regiment & side effects (2) Review of new medications or change in dosage (2)  four-week's I certify that outpatient services furnished can reasonably be expected to improve the patient's condition.   Levonne Spiller, MD

## 2014-01-18 ENCOUNTER — Ambulatory Visit (INDEPENDENT_AMBULATORY_CARE_PROVIDER_SITE_OTHER): Payer: Medicare Other | Admitting: Family Medicine

## 2014-01-18 VITALS — BP 122/78 | HR 80 | Temp 98.0°F | Wt 165.0 lb

## 2014-01-18 DIAGNOSIS — R05 Cough: Secondary | ICD-10-CM | POA: Diagnosis not present

## 2014-01-18 DIAGNOSIS — R053 Chronic cough: Secondary | ICD-10-CM

## 2014-01-18 MED ORDER — MONTELUKAST SODIUM 10 MG PO TABS: 10.0000 mg | ORAL_TABLET | Freq: Every day | ORAL | Status: DC

## 2014-01-18 MED ORDER — TRAMADOL HCL 50 MG PO TABS: 50.0000 mg | ORAL_TABLET | Freq: Every evening | ORAL | Status: DC | PRN

## 2014-01-18 NOTE — Patient Instructions (Signed)
Continue allegra and flonase Continue singulair for possible allergic component of cough Continue tramadol at night time as needed as cough suppressant. If you regularly use tramadol, dont' just stop but rather slowly taper off if needed.
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
## 2014-01-18 NOTE — Progress Notes (Signed)
Pre visit review using our clinic review tool, if applicable. No additional management support is needed unless otherwise documented below in the visit note. 

## 2014-01-18 NOTE — Assessment & Plan Note (Addendum)
Chronic cough - thought to be related to bronchial irritation - improved with singulair and tramadol so will continue these meds. Also continue flonase and antihistamine.

## 2014-02-02 ENCOUNTER — Other Ambulatory Visit (HOSPITAL_COMMUNITY): Payer: Self-pay | Admitting: Psychiatry

## 2014-02-07 ENCOUNTER — Telehealth (HOSPITAL_COMMUNITY): Payer: Self-pay | Admitting: *Deleted

## 2014-02-18 DIAGNOSIS — N952 Postmenopausal atrophic vaginitis: Secondary | ICD-10-CM

## 2014-02-18 HISTORY — DX: Postmenopausal atrophic vaginitis: N95.2

## 2014-03-14 DIAGNOSIS — R404 Transient alteration of awareness: Secondary | ICD-10-CM | POA: Diagnosis not present

## 2014-03-14 DIAGNOSIS — R42 Dizziness and giddiness: Secondary | ICD-10-CM | POA: Diagnosis not present

## 2014-03-19 ENCOUNTER — Encounter (HOSPITAL_COMMUNITY): Payer: Self-pay | Admitting: Family Medicine

## 2014-03-19 ENCOUNTER — Emergency Department (HOSPITAL_COMMUNITY)
Admission: EM | Admit: 2014-03-19 | Discharge: 2014-03-19 | Disposition: A | Discharge status: Home / Self Care | Payer: Medicare Other | Attending: Emergency Medicine | Admitting: Emergency Medicine

## 2014-03-19 DIAGNOSIS — Z8659 Personal history of other mental and behavioral disorders: Secondary | ICD-10-CM | POA: Insufficient documentation

## 2014-03-19 DIAGNOSIS — Z880 Allergy status to penicillin: Secondary | ICD-10-CM | POA: Insufficient documentation

## 2014-03-19 DIAGNOSIS — F419 Anxiety disorder, unspecified: Secondary | ICD-10-CM | POA: Diagnosis not present

## 2014-03-19 DIAGNOSIS — N390 Urinary tract infection, site not specified: Secondary | ICD-10-CM | POA: Diagnosis not present

## 2014-03-19 DIAGNOSIS — B9689 Other specified bacterial agents as the cause of diseases classified elsewhere: Secondary | ICD-10-CM | POA: Diagnosis not present

## 2014-03-19 DIAGNOSIS — Z8669 Personal history of other diseases of the nervous system and sense organs: Secondary | ICD-10-CM | POA: Diagnosis not present

## 2014-03-19 DIAGNOSIS — Z87442 Personal history of urinary calculi: Secondary | ICD-10-CM | POA: Insufficient documentation

## 2014-03-19 DIAGNOSIS — Z8739 Personal history of other diseases of the musculoskeletal system and connective tissue: Secondary | ICD-10-CM | POA: Insufficient documentation

## 2014-03-19 DIAGNOSIS — Z79899 Other long term (current) drug therapy: Secondary | ICD-10-CM | POA: Insufficient documentation

## 2014-03-19 DIAGNOSIS — E039 Hypothyroidism, unspecified: Secondary | ICD-10-CM | POA: Diagnosis not present

## 2014-03-19 DIAGNOSIS — Z8719 Personal history of other diseases of the digestive system: Secondary | ICD-10-CM | POA: Insufficient documentation

## 2014-03-19 DIAGNOSIS — R339 Retention of urine, unspecified: Secondary | ICD-10-CM | POA: Diagnosis not present

## 2014-03-19 DIAGNOSIS — Z9049 Acquired absence of other specified parts of digestive tract: Secondary | ICD-10-CM | POA: Diagnosis not present

## 2014-03-19 LAB — CBC
HCT: 39.9 % (ref 36.0–46.0)
HEMOGLOBIN: 13.2 g/dL (ref 12.0–15.0)
MCH: 28.9 pg (ref 26.0–34.0)
MCHC: 33.1 g/dL (ref 30.0–36.0)
MCV: 87.5 fL (ref 78.0–100.0)
Platelets: 252 10*3/uL (ref 150–400)
RBC: 4.56 MIL/uL (ref 3.87–5.11)
RDW: 13.1 % (ref 11.5–15.5)
WBC: 8.7 10*3/uL (ref 4.0–10.5)

## 2014-03-19 LAB — URINALYSIS, ROUTINE W REFLEX MICROSCOPIC
GLUCOSE, UA: NEGATIVE mg/dL
HGB URINE DIPSTICK: NEGATIVE
Ketones, ur: 15 mg/dL — AB
NITRITE: POSITIVE — AB
Protein, ur: NEGATIVE mg/dL
Specific Gravity, Urine: 1.012 (ref 1.005–1.030)
Urobilinogen, UA: 1 mg/dL (ref 0.0–1.0)
pH: 6 (ref 5.0–8.0)

## 2014-03-19 LAB — COMPREHENSIVE METABOLIC PANEL
ALBUMIN: 3.4 g/dL — AB (ref 3.5–5.2)
ALT: 10 U/L (ref 0–35)
AST: 15 U/L (ref 0–37)
Alkaline Phosphatase: 66 U/L (ref 39–117)
Anion gap: 9 (ref 5–15)
BILIRUBIN TOTAL: 0.9 mg/dL (ref 0.3–1.2)
BUN: 10 mg/dL (ref 6–23)
CO2: 23 mmol/L (ref 19–32)
Calcium: 9 mg/dL (ref 8.4–10.5)
Chloride: 106 mmol/L (ref 96–112)
Creatinine, Ser: 1.03 mg/dL (ref 0.50–1.10)
GFR calc Af Amer: 59 mL/min — ABNORMAL LOW (ref >90)
GFR, EST NON AFRICAN AMERICAN: 51 mL/min — AB (ref >90)
GLUCOSE: 96 mg/dL (ref 70–99)
POTASSIUM: 3.9 mmol/L (ref 3.5–5.1)
Sodium: 138 mmol/L (ref 135–145)
Total Protein: 6.6 g/dL (ref 6.0–8.3)

## 2014-03-19 LAB — URINE MICROSCOPIC-ADD ON

## 2014-03-19 MED ORDER — CEPHALEXIN 250 MG PO CAPS
500.0000 mg | ORAL_CAPSULE | Freq: Once | ORAL | Status: AC
  Administered 2014-03-19: 500 mg via ORAL
  Filled 2014-03-19: qty 2

## 2014-03-19 MED ORDER — CEPHALEXIN 500 MG PO CAPS: 500.0000 mg | ORAL_CAPSULE | Freq: Three times a day (TID) | ORAL | Status: DC

## 2014-03-19 NOTE — ED Notes (Signed)
Pt and family verbalized understanding of catheter care and prescription. No furtther questions

## 2014-03-19 NOTE — Discharge Instructions (Signed)
It was our pleasure to provide your ER care today - we hope that you feel better.  Empty urinary catheter bag as need.   Do not attempt to remove the catheter, as there is a balloon in your bladder, and removal without balloon being deflated can cause damage to bladder and urethra.  For possible urinary infection, take antibiotic (keflex) as prescribed.  Drink plenty of fluids.  If constipated, drink plenty of water/fluids, get adequate fiber in diet.  You may also take colace (stool softener) and miralax (laxative) as need - both of these medications are available over the counter.   Follow up with your doctor/urologist in the next few days - call office Monday to arrange appointment.  Discuss removal of catheter then.   Return to ER if worse, new symptoms, fevers, persistent vomiting, weak/fainting, other concern.      Acute Urinary Retention Acute urinary retention is the temporary inability to urinate. This is an uncommon problem in women. It can be caused by:  Infection.  A side effect of a medicine.  A problem in a nearby organ that presses or squeezes on the bladder or the urethra (the tube that drains the bladder).  Psychological problems.   Surgery on your bladder, urethra, or pelvic organs that causes obstruction to the outflow of urine from your bladder. HOME CARE INSTRUCTIONS  If you are sent home with a Foley catheter and a drainage system, you will need to discuss the best course of action with your health care provider. While the catheter is in, maintain a good intake of fluids. Keep the drainage bag emptied and lower than your catheter. This is so that contaminated urine will not flow back into your bladder, which could lead to a urinary tract infection. There are two main types of drainage bags. One is a large bag that usually is used at night. It has a good capacity that will allow you to sleep through the night without having to empty it. The second type is called a  leg bag. It has a smaller capacity so it needs to be emptied more frequently. However, the main advantage is that it can be attached by a leg strap and goes underneath your clothing, allowing you the freedom to move about or leave your home. Only take over-the-counter or prescription medicines for pain, discomfort, or fever as directed by your health care provider.  SEEK MEDICAL CARE IF:  You develop a low-grade fever.  You experience spasms or leakage of urine with the spasms. SEEK IMMEDIATE MEDICAL CARE IF:   You develop chills or fever.  Your catheter stops draining urine.  Your catheter falls out.  You start to develop increased bleeding that does not respond to rest and increased fluid intake. MAKE SURE YOU:  Understand these instructions.  Will watch your condition.  Will get help right away if you are not doing well or get worse. Document Released: 02/03/2006 Document Revised: 11/25/2012 Document Reviewed: 07/16/2012 [facility] Patient Information 2015 [place], [place]. This information is not intended to replace advice given to you by your health care provider. Make sure you discuss any questions you have with your health care provider.    Foley Catheter Care A Foley catheter is a soft, flexible tube that is placed into the bladder to drain urine. A Foley catheter may be inserted if:  You leak urine or are not able to control when you urinate (urinary incontinence).  You are not able to urinate when you need to (  urinary retention).  You had prostate surgery or surgery on the genitals.  You have certain medical conditions, such as multiple sclerosis, dementia, or a spinal cord injury. If you are going home with a Foley catheter in place, follow the instructions below. TAKING CARE OF THE CATHETER  Wash your hands with soap and water.  Using mild soap and warm water on a clean washcloth:  Clean the area on your body closest to the catheter insertion site using a  circular motion, moving away from the catheter. Never wipe toward the catheter because this could sweep bacteria up into the urethra and cause infection.  Remove all traces of soap. Pat the area dry with a clean towel. For males, reposition the foreskin.  Attach the catheter to your leg so there is no tension on the catheter. Use adhesive tape or a leg strap. If you are using adhesive tape, remove any sticky residue left behind by the previous tape you used.  Keep the drainage bag below the level of the bladder, but keep it off the floor.  Check throughout the day to be sure the catheter is working and urine is draining freely. Make sure the tubing does not become kinked.  Do not pull on the catheter or try to remove it. Pulling could damage internal tissues. TAKING CARE OF THE DRAINAGE BAGS You will be given two drainage bags to take home. One is a large overnight drainage bag, and the other is a smaller leg bag that fits underneath clothing. You may wear the overnight bag at any time, but you should never wear the smaller leg bag at night. Follow the instructions below for how to empty, change, and clean your drainage bags. Emptying the Drainage Bag You must empty your drainage bag when it is  - full or at least 2-3 times a day.  Wash your hands with soap and water.  Keep the drainage bag below your hips, below the level of your bladder. This stops urine from going back into the tubing and into your bladder.  Hold the dirty bag over the toilet or a clean container.  Open the pour spout at the bottom of the bag and empty the urine into the toilet or container. Do not let the pour spout touch the toilet, container, or any other surface. Doing so can place bacteria on the bag, which can cause an infection.  Clean the pour spout with a gauze pad or cotton ball that has rubbing alcohol on it.  Close the pour spout.  Attach the bag to your leg with adhesive tape or a leg strap.  Wash your  hands well. Changing the Drainage Bag Change your drainage bag once a month or sooner if it starts to smell bad or look dirty. Below are steps to follow when changing the drainage bag.  Wash your hands with soap and water.  Pinch off the rubber catheter so that urine does not spill out.  Disconnect the catheter tube from the drainage tube at the connection valve. Do not let the tubes touch any surface.  Clean the end of the catheter tube with an alcohol wipe. Use a different alcohol wipe to clean the end of the drainage tube.  Connect the catheter tube to the drainage tube of the clean drainage bag.  Attach the new bag to the leg with adhesive tape or a leg strap. Avoid attaching the new bag too tightly.  Wash your hands well. Cleaning the Drainage Bag  Wash  your hands with soap and water.  Wash the bag in warm, soapy water.  Rinse the bag thoroughly with warm water.  Fill the bag with a solution of white vinegar and water (1 cup vinegar to 1 qt warm water [.2 L vinegar to 1 L warm water]). Close the bag and soak it for 30 minutes in the solution.  Rinse the bag with warm water.  Hang the bag to dry with the pour spout open and hanging downward.  Store the clean bag (once it is dry) in a clean plastic bag.  Wash your hands well. PREVENTING INFECTION  Wash your hands before and after handling your catheter.  Take showers daily and wash the area where the catheter enters your body. Do not take baths. Replace wet leg straps with dry ones, if this applies.  Do not use powders, sprays, or lotions on the genital area. Only use creams, lotions, or ointments as directed by your caregiver.  For females, wipe from front to back after each bowel movement.  Drink enough fluids to keep your urine clear or pale yellow unless you have a fluid restriction.  Do not let the drainage bag or tubing touch or lie on the floor.  Wear cotton underwear to absorb moisture and to keep your skin  drier. SEEK MEDICAL CARE IF:   Your urine is cloudy or smells unusually bad.  Your catheter becomes clogged.  You are not draining urine into the bag or your bladder feels full.  Your catheter starts to leak. SEEK IMMEDIATE MEDICAL CARE IF:   You have pain, swelling, redness, or pus where the catheter enters the body.  You have pain in the abdomen, legs, lower back, or bladder.  You have a fever.  You see blood fill the catheter, or your urine is pink or red.  You have nausea, vomiting, or chills.  Your catheter gets pulled out. MAKE SURE YOU:   Understand these instructions.  Will watch your condition.  Will get help right away if you are not doing well or get worse. Document Released: 02/04/2005 Document Revised: 06/21/2013 Document Reviewed: 01/27/2012 [facility] Patient Information 2015 [place], [place]. This information is not intended to replace advice given to you by your health care provider. Make sure you discuss any questions you have with your health care provider.    Constipation Constipation is when a person has fewer than three bowel movements a week, has difficulty having a bowel movement, or has stools that are dry, hard, or larger than normal. As people grow older, constipation is more common. If you try to fix constipation with medicines that make you have a bowel movement (laxatives), the problem may get worse. Long-term laxative use may cause the muscles of the colon to become weak. A low-fiber diet, not taking in enough fluids, and taking certain medicines may make constipation worse.  CAUSES   Certain medicines, such as antidepressants, pain medicine, iron supplements, antacids, and water pills.   Certain diseases, such as diabetes, irritable bowel syndrome (IBS), thyroid disease, or depression.   Not drinking enough water.   Not eating enough fiber-rich foods.   Stress or travel.   Lack of physical activity or exercise.   Ignoring the urge  to have a bowel movement.   Using laxatives too much.  SIGNS AND SYMPTOMS   Having fewer than three bowel movements a week.   Straining to have a bowel movement.   Having stools that are hard, dry, or larger than normal.  Feeling full or bloated.   Pain in the lower abdomen.   Not feeling relief after having a bowel movement.  DIAGNOSIS  Your health care provider will take a medical history and perform a physical exam. Further testing may be done for severe constipation. Some tests may include:  A barium enema X-ray to examine your rectum, colon, and, sometimes, your small intestine.   A sigmoidoscopy to examine your lower colon.   A colonoscopy to examine your entire colon. TREATMENT  Treatment will depend on the severity of your constipation and what is causing it. Some dietary treatments include drinking more fluids and eating more fiber-rich foods. Lifestyle treatments may include regular exercise. If these diet and lifestyle recommendations do not help, your health care provider may recommend taking over-the-counter laxative medicines to help you have bowel movements. Prescription medicines may be prescribed if over-the-counter medicines do not work.  HOME CARE INSTRUCTIONS   Eat foods that have a lot of fiber, such as fruits, vegetables, whole grains, and beans.  Limit foods high in fat and processed sugars, such as french fries, hamburgers, cookies, candies, and soda.   A fiber supplement may be added to your diet if you cannot get enough fiber from foods.   Drink enough fluids to keep your urine clear or pale yellow.   Exercise regularly or as directed by your health care provider.   Go to the restroom when you have the urge to go. Do not hold it.   Only take over-the-counter or prescription medicines as directed by your health care provider. Do not take other medicines for constipation without talking to your health care provider first.  [place] IF:   You have bright red blood in your stool.   Your constipation lasts for more than 4 days or gets worse.   You have abdominal or rectal pain.   You have thin, pencil-like stools.   You have unexplained weight loss. MAKE SURE YOU:   Understand these instructions.  Will watch your condition.  Will get help right away if you are not doing well or get worse. Document Released: 11/03/2003 Document Revised: 02/09/2013 Document Reviewed: 11/16/2012 [facility] Patient Information 2015 [place], [place]. This information is not intended to replace advice given to you by your health care provider. Make sure you discuss any questions you have with your health care provider.

## 2014-03-19 NOTE — ED Notes (Signed)
Pt here for urinary retention and bladder distention since Saturday. sts she has been treating herself for a yeast infection.

## 2014-03-19 NOTE — ED Provider Notes (Addendum)
CSN: [id number]     Arrival date & time 03/19/14  1322 History   First MD Initiated Contact with Patient 03/19/14 1347     Chief Complaint  Patient presents with  . Urinary Retention     (Consider location/radiation/quality/duration/timing/severity/associated sxs/prior Treatment) The history is provided by the patient.  pt c/o urinary urgency, unable to empty bladder for the past 2 days. Symptoms persistent, constant, mod-severe. No dysuria. No fever or chills. +suprapubic fullness/pain - no other abd pain. No flank pain. Had been having normal bms. No nv. Poor appetite at baseline.  States otherwise recent health at baseline. No recent febrile illness. No trauma or fall.  Pt does not mild constipation, last normal bm 2 days ago. No nv.  No low back pain. No radicular pain. No perineal or leg numbness. No weakness. No problems w gait/balance, or normal function. No fever/chills.        Past Medical History  Diagnosis Date  . GERD (gastroesophageal reflux disease)     intermittent, treated with tums  . Hypothyroidism   . Scoliosis   . Hiatal hernia   . Adenomatous polyp     x1  . Lumbago   . Severe recurrent depression with psychosis   . Diverticulitis   . Personal history of kidney stones     x1  . ALLERGIC RHINITIS 12/20/2006  . Osteoporosis 2007    declined prolia  . Vitamin D deficiency   . Chronic anxiety   . Insomnia    Past Surgical History  Procedure Laterality Date  . Appendectomy  1954  . Cholecystectomy    . Abdominal hysterectomy  1979    ectopic pregnancy with one ovary removed  . Tonsillectomy    . Oophorectomy Right 1965    Ectopic pregnancy, removal of right ovary and tube- 1960  . Esophagogastroduodenoscopy    . Rotator cuff repair Right   . Knee surgery Left 2012    L medial meniscal tear   Family History  Problem Relation Age of Onset  . Schizophrenia Sister   . Alcohol abuse Neg Hx   . Bipolar disorder Neg Hx   . Dementia Neg Hx   . Drug  abuse Neg Hx   . OCD Neg Hx   . Paranoid behavior Neg Hx   . Seizures Neg Hx   . Sexual abuse Neg Hx   . Physical abuse Neg Hx   . Anxiety disorder Grandchild   . Depression Grandchild   . Cancer Sister     lung  . Healthy Sister   . Diabetes Mother   . Diabetes Father   . Diabetes Sister   . Diabetes Brother   . Cancer Sister     kidney   History  Substance Use Topics  . Smoking status: Never Smoker   . Smokeless tobacco: Never Used  . Alcohol Use: No   OB History    No data available     Review of Systems  Constitutional: Negative for fever and chills.  HENT: Negative for sore throat.   Eyes: Negative for redness.  Respiratory: Negative for shortness of breath.   Cardiovascular: Negative for chest pain.  Gastrointestinal: Negative for vomiting and diarrhea.  Genitourinary: Positive for urgency. Negative for dysuria, hematuria and flank pain.  Musculoskeletal: Negative for back pain, gait problem and neck pain.  Skin: Negative for rash.  Neurological: Negative for weakness, numbness and headaches.  Hematological: Does not bruise/bleed easily.  Psychiatric/Behavioral: Negative for confusion.  Allergies  Amoxicillin; Morphine and related; Sulfonamide derivatives; and Valium  Home Medications   Prior to Admission medications   Medication Sig Start Date End Date Taking? Authorizing Provider  albuterol (PROVENTIL HFA;VENTOLIN HFA) 108 (90 BASE) MCG/ACT inhaler Inhale 2 puffs into the lungs every 6 (six) hours as needed (cough). 05/07/13   Ria Bush, MD  benztropine (COGENTIN) 0.5 MG tablet Take 1 tablet (0.5 mg total) by mouth 3 (three) times daily as needed for tremors. 01/07/14   Levonne Spiller, MD  clonazePAM (KLONOPIN) 0.5 MG tablet Take 1 tablet (0.5 mg total) by mouth at bedtime. 01/07/14 01/07/15  Levonne Spiller, MD  dicyclomine (BENTYL) 10 MG capsule Take 10 mg by mouth daily as needed. For diverticulitis    Historical Provider, MD  fluticasone  (FLONASE) 50 MCG/ACT nasal spray Place 2 sprays into both nostrils daily as needed for allergies or rhinitis.    Historical Provider, MD  lamoTRIgine (LAMICTAL) 25 MG tablet Take 2 tablets (50 mg total) by mouth 2 (two) times daily. 01/07/14   Levonne Spiller, MD  levothyroxine (SYNTHROID, LEVOTHROID) 88 MCG tablet TAKE 1 TABLET EVERY MORNING (ONE REFILL ONLY, PATIENT NEEDS OFFICE VISIT) 11/15/13   Elayne Snare, MD  montelukast (SINGULAIR) 10 MG tablet Take 1 tablet (10 mg total) by mouth at bedtime. 01/18/14   Ria Bush, MD  risperiDONE (RISPERDAL) 0.25 MG tablet Take 3 tablets (0.75 mg total) by mouth daily as needed (drop in mood). 01/07/14   Levonne Spiller, MD  risperiDONE (RISPERDAL) 1 MG tablet Take 1 tablet (1 mg total) by mouth 2 (two) times daily. 01/07/14   Levonne Spiller, MD  traMADol (ULTRAM) 50 MG tablet Take 1 tablet (50 mg total) by mouth at bedtime as needed (cough). 01/18/14   Ria Bush, MD  venlafaxine XR (EFFEXOR XR) 75 MG 24 hr capsule Take 1 capsule (75 mg total) by mouth daily. 01/07/14 01/07/15  Levonne Spiller, MD  Vitamin D, Ergocalciferol, (DRISDOL) 50000 UNITS CAPS capsule Take 1 capsule (50,000 Units total) by mouth every 7 (seven) days. 12/27/13   Elayne Snare, MD   BP 105/69 mmHg  Pulse 92  Temp(Src) 98.2 F (36.8 C)  Resp 16  SpO2 93%  LMP 06/18/1977 Physical Exam  Constitutional: She appears well-developed and well-nourished. No distress.  HENT:  Mouth/Throat: Oropharynx is clear and moist.  Eyes: Conjunctivae are normal. No scleral icterus.  Neck: Neck supple. No tracheal deviation present.  Cardiovascular: Normal rate.   Pulmonary/Chest: Effort normal. No respiratory distress.  Abdominal: Soft. Normal appearance and bowel sounds are normal. She exhibits distension. She exhibits no mass. There is no rebound and no guarding.  Suprapubic distension/fullness, and mild tenderness. No incarc hernia.   Genitourinary:  No cva tenderness  Musculoskeletal: She  exhibits no edema.  Neurological: She is alert.  Normal perineal sensation. Motor intact bil ext, str 5/5. sens intact.   Skin: Skin is warm and dry. No rash noted. She is not diaphoretic.  Psychiatric: She has a normal mood and affect.  Nursing note and vitals reviewed.   ED Course  Procedures (including critical care time) Labs Review   Results for orders placed or performed during the hospital encounter of 03/19/14  CBC  Result Value Ref Range   WBC 8.7 4.0 - 10.5 K/uL   RBC 4.56 3.87 - 5.11 MIL/uL   Hemoglobin 13.2 12.0 - 15.0 g/dL   HCT 39.9 36.0 - 46.0 %   MCV 87.5 78.0 - 100.0 fL   MCH 28.9 26.0 -  34.0 pg   MCHC 33.1 30.0 - 36.0 g/dL   RDW 13.1 11.5 - 15.5 %   Platelets 252 150 - 400 K/uL  Comprehensive metabolic panel  Result Value Ref Range   Sodium 138 135 - 145 mmol/L   Potassium 3.9 3.5 - 5.1 mmol/L   Chloride 106 96 - 112 mmol/L   CO2 23 19 - 32 mmol/L   Glucose, Bld 96 70 - 99 mg/dL   BUN 10 6 - 23 mg/dL   Creatinine, Ser 1.03 0.50 - 1.10 mg/dL   Calcium 9.0 8.4 - 10.5 mg/dL   Total Protein 6.6 6.0 - 8.3 g/dL   Albumin 3.4 (L) 3.5 - 5.2 g/dL   AST 15 0 - 37 U/L   ALT 10 0 - 35 U/L   Alkaline Phosphatase 66 39 - 117 U/L   Total Bilirubin 0.9 0.3 - 1.2 mg/dL   GFR calc non Af Amer 51 (L) >90 mL/min   GFR calc Af Amer 59 (L) >90 mL/min   Anion gap 9 5 - 15  Urinalysis, Routine w reflex microscopic  Result Value Ref Range   Color, Urine AMBER (A) YELLOW   APPearance CLEAR CLEAR   Specific Gravity, Urine 1.012 1.005 - 1.030   pH 6.0 5.0 - 8.0   Glucose, UA NEGATIVE NEGATIVE mg/dL   Hgb urine dipstick NEGATIVE NEGATIVE   Bilirubin Urine SMALL (A) NEGATIVE   Ketones, ur 15 (A) NEGATIVE mg/dL   Protein, ur NEGATIVE NEGATIVE mg/dL   Urobilinogen, UA 1.0 0.0 - 1.0 mg/dL   Nitrite POSITIVE (A) NEGATIVE   Leukocytes, UA TRACE (A) NEGATIVE  Urine microscopic-add on  Result Value Ref Range   Squamous Epithelial / LPF RARE RARE   WBC, UA 0-2 <3 WBC/hpf    RBC / HPF 0-2 <3 RBC/hpf   Bacteria, UA RARE RARE      MDM   Labs.  Bladder scan shows > 999 cc - will place foley.  Reviewed nursing notes and prior charts for additional history.   Possible uti w LE and nitrite +, and recent urinary symptoms.  Will cx and rx.  pvr 900 cc, leg bag.  Will have f/u urology re urinary retention.   Pt taking fluids, no nv. No fever. Other blood work unremarkable.  Pt appears stable for d/c.  Return precautions discussed.  Leg bag.  Recheck no new c/o. Feels improved and states feels ready for d/c.      Mirna Mires, MD 03/19/14 [phone]

## 2014-03-21 LAB — URINE CULTURE
COLONY COUNT: NO GROWTH
CULTURE: NO GROWTH

## 2014-03-22 DIAGNOSIS — R339 Retention of urine, unspecified: Secondary | ICD-10-CM | POA: Diagnosis not present

## 2014-03-22 DIAGNOSIS — R351 Nocturia: Secondary | ICD-10-CM | POA: Diagnosis not present

## 2014-03-22 DIAGNOSIS — R350 Frequency of micturition: Secondary | ICD-10-CM | POA: Diagnosis not present

## 2014-04-05 ENCOUNTER — Ambulatory Visit (INDEPENDENT_AMBULATORY_CARE_PROVIDER_SITE_OTHER): Payer: Medicare Other | Admitting: Family Medicine

## 2014-04-05 ENCOUNTER — Encounter: Payer: Self-pay | Admitting: Family Medicine

## 2014-04-05 VITALS — BP 134/62 | HR 84 | Temp 98.0°F | Wt 157.5 lb

## 2014-04-05 DIAGNOSIS — R053 Chronic cough: Secondary | ICD-10-CM

## 2014-04-05 DIAGNOSIS — R05 Cough: Secondary | ICD-10-CM

## 2014-04-05 DIAGNOSIS — R3915 Urgency of urination: Secondary | ICD-10-CM | POA: Diagnosis not present

## 2014-04-05 NOTE — Assessment & Plan Note (Signed)
From bronchial irritation - cough-free on current regimen - singulair, flonase, tramadol and prn albuterol.

## 2014-04-05 NOTE — Progress Notes (Signed)
BP 134/62 mmHg  Pulse 84  Temp(Src) 98 F (36.7 C) (Oral)  Wt 157 lb 8 oz (71.442 kg)  LMP 06/18/1977   CC: ER f/u visit  Subjective:    Patient ID: Jamie Pitts, female    DOB: 29-Jan-1934, 81 y.o.   MRN: [id number]  HPI: Jamie Pitts is a 81 y.o. female presenting on 04/05/2014 for Follow-up   Jamie Pitts was seen 03/19/2014 at [facility] ER with urinary retention with bladder scan of >999cc. Advised had overactive bladder. Foley placed, f/u scheduled with urology. UA with LE and nitrites, she was treated with 5d keflex 500mg  tid. UCx returned no growth.  WBC 8.7, Hgb 13.2, Na 138, Cr 1.03, and LFTs WNL.   She saw Dr Arlyn Leak office 03/22/2014 and had catheter removed, but did not like that experience specifically the narrow exam chair and does not want to return to that office. She had f/u appt with [facility] today at 2:30pm I presume for PVR but decided against returning to this office. Requests referral to different urological office.  Since catheter has been out, she's had no trouble, endorses voiding well, complete emptying. Denies current fevers/chills, dysuria, urgency, frequency, hematuria. Denies bladder accidents. No incontinence.   Relevant past medical, surgical, family and social history reviewed and updated as indicated. Interim medical history since our last visit reviewed. Allergies and medications reviewed and updated. Current Outpatient Prescriptions on File Prior to Visit  Medication Sig  . albuterol (PROVENTIL HFA;VENTOLIN HFA) 108 (90 BASE) MCG/ACT inhaler Inhale 2 puffs into the lungs every 6 (six) hours as needed (cough).  . benztropine (COGENTIN) 0.5 MG tablet Take 1 tablet (0.5 mg total) by mouth 3 (three) times daily as needed for tremors.  . clonazePAM (KLONOPIN) 0.5 MG tablet Take 1 tablet (0.5 mg total) by mouth at bedtime.  . dicyclomine (BENTYL) 10 MG capsule Take 10 mg by mouth daily as needed. For diverticulitis  . fluticasone (FLONASE) 50 MCG/ACT  nasal spray Place 2 sprays into both nostrils daily as needed for allergies or rhinitis.  Marland Kitchen lamoTRIgine (LAMICTAL) 25 MG tablet Take 2 tablets (50 mg total) by mouth 2 (two) times daily.  Marland Kitchen levothyroxine (SYNTHROID, LEVOTHROID) 88 MCG tablet TAKE 1 TABLET EVERY MORNING (ONE REFILL ONLY, PATIENT NEEDS OFFICE VISIT)  . montelukast (SINGULAIR) 10 MG tablet Take 1 tablet (10 mg total) by mouth at bedtime.  . risperiDONE (RISPERDAL) 0.25 MG tablet Take 3 tablets (0.75 mg total) by mouth daily as needed (drop in mood).  . risperiDONE (RISPERDAL) 1 MG tablet Take 1 tablet (1 mg total) by mouth 2 (two) times daily.  . traMADol (ULTRAM) 50 MG tablet Take 1 tablet (50 mg total) by mouth at bedtime as needed (cough).  . venlafaxine XR (EFFEXOR XR) 75 MG 24 hr capsule Take 1 capsule (75 mg total) by mouth daily.  . Vitamin D, Ergocalciferol, (DRISDOL) 50000 UNITS CAPS capsule Take 1 capsule (50,000 Units total) by mouth every 7 (seven) days.   No current facility-administered medications on file prior to visit.   Past Medical History  Diagnosis Date  . GERD (gastroesophageal reflux disease)     intermittent, treated with tums  . Hypothyroidism   . Scoliosis   . Hiatal hernia   . Adenomatous polyp     x1  . Lumbago   . Severe recurrent depression with psychosis   . Diverticulitis   . Personal history of kidney stones     x1  . ALLERGIC RHINITIS 12/20/2006  . Osteoporosis  2007    declined prolia  . Vitamin D deficiency   . Chronic anxiety   . Insomnia     Past Surgical History  Procedure Laterality Date  . Appendectomy  1954  . Cholecystectomy    . Abdominal hysterectomy  1979    ectopic pregnancy with one ovary removed  . Tonsillectomy    . Oophorectomy Right 1965    Ectopic pregnancy, removal of right ovary and tube- 1960  . Esophagogastroduodenoscopy    . Rotator cuff repair Right   . Knee surgery Left 2012    L medial meniscal tear    History  Substance Use Topics  . Smoking  status: Never Smoker   . Smokeless tobacco: Never Used  . Alcohol Use: No   Review of Systems Per HPI unless specifically indicated above     Objective:    BP 134/62 mmHg  Pulse 84  Temp(Src) 98 F (36.7 C) (Oral)  Wt 157 lb 8 oz (71.442 kg)  LMP 06/18/1977  Wt Readings from Last 3 Encounters:  04/05/14 157 lb 8 oz (71.442 kg)  01/18/14 165 lb (74.844 kg)  01/07/14 166 lb 6.4 oz (75.479 kg)    Physical Exam  Constitutional: She appears well-developed and well-nourished. No distress.  Abdominal: Soft. Normal appearance and bowel sounds are normal. She exhibits no distension and no mass. There is no hepatosplenomegaly. There is tenderness (mild) in the suprapubic area. There is no rigidity, no rebound, no guarding, no CVA tenderness and negative Murphy's sign. No hernia.  Musculoskeletal: She exhibits no edema.  Skin: Skin is warm and dry. No rash noted.  Nursing note and vitals reviewed.  Results for orders placed or performed during the hospital encounter of 03/19/14  Urine culture  Result Value Ref Range   Specimen Description URINE, CATHETERIZED    Special Requests NONE    Colony Count NO GROWTH Performed at [facility]     Culture NO GROWTH Performed at [facility]     Report Status 03/21/2014 FINAL   CBC  Result Value Ref Range   WBC 8.7 4.0 - 10.5 K/uL   RBC 4.56 3.87 - 5.11 MIL/uL   Hemoglobin 13.2 12.0 - 15.0 g/dL   HCT 39.9 36.0 - 46.0 %   MCV 87.5 78.0 - 100.0 fL   MCH 28.9 26.0 - 34.0 pg   MCHC 33.1 30.0 - 36.0 g/dL   RDW 13.1 11.5 - 15.5 %   Platelets 252 150 - 400 K/uL  Comprehensive metabolic panel  Result Value Ref Range   Sodium 138 135 - 145 mmol/L   Potassium 3.9 3.5 - 5.1 mmol/L   Chloride 106 96 - 112 mmol/L   CO2 23 19 - 32 mmol/L   Glucose, Bld 96 70 - 99 mg/dL   BUN 10 6 - 23 mg/dL   Creatinine, Ser 1.03 0.50 - 1.10 mg/dL   Calcium 9.0 8.4 - 10.5 mg/dL   Total Protein 6.6 6.0 - 8.3 g/dL   Albumin 3.4 (L) 3.5 - 5.2  g/dL   AST 15 0 - 37 U/L   ALT 10 0 - 35 U/L   Alkaline Phosphatase 66 39 - 117 U/L   Total Bilirubin 0.9 0.3 - 1.2 mg/dL   GFR calc non Af Amer 51 (L) >90 mL/min   GFR calc Af Amer 59 (L) >90 mL/min   Anion gap 9 5 - 15  Urinalysis, Routine w reflex microscopic  Result Value Ref Range   Color,  Urine AMBER (A) YELLOW   APPearance CLEAR CLEAR   Specific Gravity, Urine 1.012 1.005 - 1.030   pH 6.0 5.0 - 8.0   Glucose, UA NEGATIVE NEGATIVE mg/dL   Hgb urine dipstick NEGATIVE NEGATIVE   Bilirubin Urine SMALL (A) NEGATIVE   Ketones, ur 15 (A) NEGATIVE mg/dL   Protein, ur NEGATIVE NEGATIVE mg/dL   Urobilinogen, UA 1.0 0.0 - 1.0 mg/dL   Nitrite POSITIVE (A) NEGATIVE   Leukocytes, UA TRACE (A) NEGATIVE  Urine microscopic-add on  Result Value Ref Range   Squamous Epithelial / LPF RARE RARE   WBC, UA 0-2 <3 WBC/hpf   RBC / HPF 0-2 <3 RBC/hpf   Bacteria, UA RARE RARE      Assessment & Plan:   Problem List Items Addressed This Visit    Urinary urgency - Primary    Unclear cause - as UCx with no growth. Has finished keflex course. She never returned to [facility] for f/u visit and requests new referral - will refer to Dr Cherrie Gauze office in [place] to see if PVR needed. Symptomatically significantly improved. ?tramadol related - use with caution.      Relevant Orders   Ambulatory referral to Urology   Chronic cough    From bronchial irritation - cough-free on current regimen - singulair, flonase, tramadol and prn albuterol.          Follow up plan: Return as needed.

## 2014-04-05 NOTE — Assessment & Plan Note (Addendum)
Unclear cause - as UCx with no growth. Has finished keflex course. She never returned to [facility] for f/u visit and requests new referral - will refer to Dr Cherrie Gauze office in [place] to see if PVR needed. Symptomatically significantly improved. ?tramadol related - use with caution.

## 2014-04-05 NOTE — Progress Notes (Signed)
Pre visit review using our clinic review tool, if applicable. No additional management support is needed unless otherwise documented below in the visit note. 

## 2014-04-05 NOTE — Patient Instructions (Signed)
I think you are doing well.  Let's have you see Dr Cherrie Gauze office for follow up of recent urinary retention episode - to see if we need to check bladder scan after voiding. Good to see you today, call us with questions

## 2014-04-06 ENCOUNTER — Other Ambulatory Visit: Payer: Self-pay | Admitting: Endocrinology

## 2014-04-08 ENCOUNTER — Encounter (HOSPITAL_COMMUNITY): Payer: Self-pay | Admitting: Psychiatry

## 2014-04-08 ENCOUNTER — Ambulatory Visit (INDEPENDENT_AMBULATORY_CARE_PROVIDER_SITE_OTHER): Payer: Medicare Other | Admitting: Psychiatry

## 2014-04-08 VITALS — BP 122/68 | HR 86 | Ht 65.0 in | Wt 156.8 lb

## 2014-04-08 DIAGNOSIS — F29 Unspecified psychosis not due to a substance or known physiological condition: Secondary | ICD-10-CM

## 2014-04-08 MED ORDER — CLONAZEPAM 0.5 MG PO TABS: 0.5000 mg | ORAL_TABLET | Freq: Every day | ORAL | Status: DC

## 2014-04-08 MED ORDER — VENLAFAXINE HCL 37.5 MG PO TABS: 37.5000 mg | ORAL_TABLET | Freq: Two times a day (BID) | ORAL | Status: DC

## 2014-04-08 NOTE — Progress Notes (Signed)
Patient ID: Jamie Pitts, female   DOB: [birth year], 81 y.o.   MRN: [id number] Patient ID: Jamie Pitts, female   DOB: 04-22-1933, 81 y.o.   MRN: [id number] Patient ID: Jamie Pitts, female   DOB: 24-Dec-1933, 81 y.o.   MRN: [id number] Patient ID: Jamie Pitts, female   DOB: Dec 29, 1933, 81 y.o.   MRN: [id number] Patient ID: Jamie Pitts, female   DOB: 1933/09/05, 81 y.o.   MRN: [id number] Patient ID: Jamie Pitts, female   DOB: 1933/08/04, 81 y.o.   MRN: [id number] Patient ID: Jamie Pitts, female   DOB: 1933-07-10, 81 y.o.   MRN: [id number] [facility] [id number] Progress Note Skyanne Welle MRN: [id number] DOB: 1933-11-25 Age: 81 y.o.  Date: 04/08/2014 Start Time: 10:20 AM End Time: 10:50 AM  Chief Complaint: Chief Complaint  Patient presents with  . Depression  . Hallucinations  . Anxiety  . Follow-up   Subjective: "I'm hearing music "   This patient is a 81 year old married white female who lives with her husband in [place]. She has one daughter and 3 grandchildren. She is retired from the [employer].  Apparently the patient had some sort of psychotic episode in 2013. She was admitted to [facility] because she was hearing voices at home. The voices got so bad that she cannot put a gun under the bed and wanted to shoot him. The patient is a poor historian and is hard of hearing and gets easily confused. She was placed on various medicines in the hospital which have been continued. It's unclear if she is medication compliant. For example she's not taking the Cogentin. She repeats herself a lot.  The patient states she still hearing voices every day. They're not frightening her like they were before but there seemed to her and she can't always get them to stop. She sleeping pretty well she denies being depressed but she is obviously not thinking clearly. She mentions that she's been diagnosed with parkinsonism but she doesn't have a tremor.  The patient  returns after 3 months. She has been doing okay except of recent urinary tract infection, need by urinary urgency. Her primary doctor set her up to see a urologist. Her mood has been stable and she has not had any recent hallucinations but she always worries that they will come back. She thinks she hears music coming from the neighbor's apartment but sometimes she wonders if it's within her own head. She's not entirely sure. Right now it's not bothering her and she is functioning well and continues to watch her great grandson    History of Chief Complaint:   At age 3 pt went to a "fat farm" to fatten her up.  She remembers her father paid more attention to her than her mother did.  She experienced some anxiety when she [place].  She was in [employer] for 2 years, then she was the Glass blower/designer for [employer] and then got into [employer].  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  She noted feeling very home sick to go back to [place] her home place in 2010.  Her sister had been very nervous and she was beginning to feel anxious and was started on Klonopin.  ISince then she noted songs for 3 or 4 years.  Then in October of 2013, she started hearing voices non stop and she lost 30 pounds. This prompted her to get a gun and put it under the bed and the family got concerned.  She was admitted to [facility] on January 24th.  She was stopped from the Klonopin and Lithium (pt doesn't understand how she got on that) and placed on Ripserdal, Trazodone, Lamictal, and Effexor with pretty good results, except she has very dry mouth at nigh  She has noted blurred vision since starting the meds.  She is getting new glasses for herself.  Anxiety Symptoms include confusion, decreased concentration and nervous/anxious behavior. Patient reports no dizziness or suicidal ideas.     Review of Systems  HENT: Negative.   Gastrointestinal: Negative.   Genitourinary: Negative.   Neurological: Positive  for weakness and light-headedness. Negative for dizziness, tremors, seizures, syncope, facial asymmetry, speech difficulty, numbness and headaches.       Lightheaded if gets up too fast.  Psychiatric/Behavioral: Positive for confusion, dysphoric mood and decreased concentration. Negative for suicidal ideas, hallucinations, behavioral problems, sleep disturbance, self-injury and agitation. The patient is nervous/anxious. The patient is not hyperactive.    Physical Exam  Depressive Symptoms: none now  (Hypo) Manic Symptoms:   None  Anxiety Symptoms: Excessive Worry:  Yes Panic Symptoms:  No Agoraphobia:  No Obsessive Compulsive: Yes  Symptoms: ordliness Specific Phobias:  Yes Social Anxiety:  No  Psychotic Symptoms:  None  PTSD Symptoms: Ever had a traumatic exposure:  Yes Had a traumatic exposure in the last month:  No No other features  Traumatic Brain Injury: No   Past Psychiatric History: Diagnosis: Depression  Hospitalizations: once [facility]  Outpatient Care: PCP  Substance Abuse Care: none  Self-Mutilation: none  Suicidal Attempts: none  Violent Behaviors: one episode of getting a gun, but now sees that that is not the way to handle her voices    Allergies: Allergies  Allergen Reactions  . Amoxicillin Hives    Sores in mouth  . Morphine And Related Itching    Can tolerate if necessary  . Sulfonamide Derivatives Hives    Sores in mouth  . Valium [Diazepam] Itching    Can tolerate if necessary   Past Medical History:   Past Medical History  Diagnosis Date  . GERD (gastroesophageal reflux disease)     intermittent, treated with tums  . Hypothyroidism   . Scoliosis   . Hiatal hernia   . Adenomatous polyp     x1  . Lumbago   . Severe recurrent depression with psychosis   . Diverticulitis   . Personal history of kidney stones     x1  . ALLERGIC RHINITIS 12/20/2006  . Osteoporosis 2007    declined prolia  . Vitamin D deficiency   . Chronic anxiety   .  Insomnia    Surgical History: Past Surgical History  Procedure Laterality Date  . Appendectomy  1954  . Cholecystectomy    . Abdominal hysterectomy  1979    ectopic pregnancy with one ovary removed  . Tonsillectomy    . Oophorectomy Right 1965    Ectopic pregnancy, removal of right ovary and tube- 1960  . Esophagogastroduodenoscopy    . Rotator cuff repair Right   . Knee surgery Left 2012    L medial meniscal tear   History of Loss of Consciousness:  No Seizure History:  No Cardiac History:  No Allergies: Allergies  Allergen Reactions  . Amoxicillin Hives    Sores in mouth  . Morphine And Related Itching    Can tolerate if necessary  . Sulfonamide Derivatives Hives    Sores in mouth  . Valium [Diazepam] Itching  Can tolerate if necessary   Medical History: Past Medical History  Diagnosis Date  . GERD (gastroesophageal reflux disease)     intermittent, treated with tums  . Hypothyroidism   . Scoliosis   . Hiatal hernia   . Adenomatous polyp     x1  . Lumbago   . Severe recurrent depression with psychosis   . Diverticulitis   . Personal history of kidney stones     x1  . ALLERGIC RHINITIS 12/20/2006  . Osteoporosis 2007    declined prolia  . Vitamin D deficiency   . Chronic anxiety   . Insomnia    Surgical History: Past Surgical History  Procedure Laterality Date  . Appendectomy  1954  . Cholecystectomy    . Abdominal hysterectomy  1979    ectopic pregnancy with one ovary removed  . Tonsillectomy    . Oophorectomy Right 1965    Ectopic pregnancy, removal of right ovary and tube- 1960  . Esophagogastroduodenoscopy    . Rotator cuff repair Right   . Knee surgery Left 2012    L medial meniscal tear   Family History: family history includes Anxiety disorder in her grandchild; Cancer in her sister and sister; Depression in her grandchild; Diabetes in her brother, father, mother, and sister; Healthy in her sister; Schizophrenia in her sister. There is no  history of Alcohol abuse, Bipolar disorder, Dementia, Drug abuse, OCD, Paranoid behavior, Seizures, Sexual abuse, or Physical abuse. Reviewed and no changes noted.  Current Medications:  Current Outpatient Prescriptions  Medication Sig Dispense Refill  . albuterol (PROVENTIL HFA;VENTOLIN HFA) 108 (90 BASE) MCG/ACT inhaler Inhale 2 puffs into the lungs every 6 (six) hours as needed (cough). 1 Inhaler 3  . benztropine (COGENTIN) 0.5 MG tablet Take 1 tablet (0.5 mg total) by mouth 3 (three) times daily as needed for tremors. 180 tablet 2  . clonazePAM (KLONOPIN) 0.5 MG tablet Take 1 tablet (0.5 mg total) by mouth at bedtime. 90 tablet 2  . dicyclomine (BENTYL) 10 MG capsule Take 10 mg by mouth daily as needed. For diverticulitis    . fluticasone (FLONASE) 50 MCG/ACT nasal spray Place 2 sprays into both nostrils daily as needed for allergies or rhinitis.    Marland Kitchen lamoTRIgine (LAMICTAL) 25 MG tablet Take 2 tablets (50 mg total) by mouth 2 (two) times daily. 360 tablet 2  . levothyroxine (SYNTHROID, LEVOTHROID) 88 MCG tablet TAKE ONE TABLET EVERY MORNING ( NEEDS OFFICE VISIT AFTER ONE REFILL) 90 tablet 0  . montelukast (SINGULAIR) 10 MG tablet Take 1 tablet (10 mg total) by mouth at bedtime. 90 tablet 3  . risperiDONE (RISPERDAL) 0.25 MG tablet Take 3 tablets (0.75 mg total) by mouth daily as needed (drop in mood). 270 tablet 2  . risperiDONE (RISPERDAL) 1 MG tablet Take 1 tablet (1 mg total) by mouth 2 (two) times daily. 180 tablet 2  . venlafaxine (EFFEXOR) 37.5 MG tablet Take 1 tablet (37.5 mg total) by mouth 2 (two) times daily. 180 tablet 2  . Vitamin D, Ergocalciferol, (DRISDOL) 50000 UNITS CAPS capsule Take 1 capsule (50,000 Units total) by mouth every 7 (seven) days. 12 capsule 3   No current facility-administered medications for this visit.    Previous Psychotropic Medications:  Medication Dose   Klonopin     Lithium ?    Risperdal    Lamictal    Effexor    Trazodone    Substance Abuse  History in the last 12 months: Substance Age of 1st Use  Last Use Amount Specific Type  Nicotine  none        Alcohol  20  March 11      Cannabis  none        Opiates  none        Cocaine  none        Methamphetamines  none        LSD  none        Ecstasy  none         Benzodiazepines  [age]      Caffeine  childhood  this AM      Inhalants  none        Others:       sugar  childhood  this AM     Medical Consequences of Substance Abuse: none  Legal Consequences of Substance Abuse: none  Family Consequences of Substance Abuse: none  Social History: Current Place of Residence: [address] [place] [address] Place of Birth: [place], [place] Family Members: husband Marital Status:  Married Children: 1  Sons: 0  Daughters: 1 Relationships: husbandf Education:  Dentist Problems/Performance: good Religious Beliefs/Practices: [employer] History of Abuse: none Occupational Experiences: clerical, office, Architect History:  Press photographer History: none Hobbies/Interests: walking, reading, playing cards  Mental Status Examination/Evaluation: Objective:  Appearance: Casual neatly dressed   Engineer, water::  Good  Speech: Clear and coherent   Volume:  Normal  Mood:  neutral  Affect:  Congruent  Thought Process fairly organized today   Thought content: No current hallucinations     Suicidal Thoughts:  No  Homicidal Thoughts:  No  Judgement:  Good  Insight:  Good  Psychomotor Activity:  Normal  Akathisia:  No  Handed:  Right  AIMS (if indicated):    Assets:  Communication Skills Desire for Improvement   Lab Results:  Results for orders placed or performed during the hospital encounter of 03/19/14 (from the past 8736 hour(s))  CBC   Collection Time: 03/19/14  2:15 PM  Result Value Ref Range   WBC 8.7 4.0 - 10.5 K/uL   RBC 4.56 3.87 - 5.11 MIL/uL   Hemoglobin 13.2 12.0 - 15.0 g/dL   HCT 39.9 36.0 - 46.0 %   MCV 87.5 78.0 - 100.0  fL   MCH 28.9 26.0 - 34.0 pg   MCHC 33.1 30.0 - 36.0 g/dL   RDW 13.1 11.5 - 15.5 %   Platelets 252 150 - 400 K/uL  Comprehensive metabolic panel   Collection Time: 03/19/14  2:15 PM  Result Value Ref Range   Sodium 138 135 - 145 mmol/L   Potassium 3.9 3.5 - 5.1 mmol/L   Chloride 106 96 - 112 mmol/L   CO2 23 19 - 32 mmol/L   Glucose, Bld 96 70 - 99 mg/dL   BUN 10 6 - 23 mg/dL   Creatinine, Ser 1.03 0.50 - 1.10 mg/dL   Calcium 9.0 8.4 - 10.5 mg/dL   Total Protein 6.6 6.0 - 8.3 g/dL   Albumin 3.4 (L) 3.5 - 5.2 g/dL   AST 15 0 - 37 U/L   ALT 10 0 - 35 U/L   Alkaline Phosphatase 66 39 - 117 U/L   Total Bilirubin 0.9 0.3 - 1.2 mg/dL   GFR calc non Af Amer 51 (L) >90 mL/min   GFR calc Af Amer 59 (L) >90 mL/min   Anion gap 9 5 - 15  Urine culture   Collection  Time: 03/19/14  2:23 PM  Result Value Ref Range   Specimen Description URINE, CATHETERIZED    Special Requests NONE    Colony Count NO GROWTH Performed at [facility]     Culture NO GROWTH Performed at [facility]     Report Status 03/21/2014 FINAL   Urinalysis, Routine w reflex microscopic   Collection Time: 03/19/14  2:23 PM  Result Value Ref Range   Color, Urine AMBER (A) YELLOW   APPearance CLEAR CLEAR   Specific Gravity, Urine 1.012 1.005 - 1.030   pH 6.0 5.0 - 8.0   Glucose, UA NEGATIVE NEGATIVE mg/dL   Hgb urine dipstick NEGATIVE NEGATIVE   Bilirubin Urine SMALL (A) NEGATIVE   Ketones, ur 15 (A) NEGATIVE mg/dL   Protein, ur NEGATIVE NEGATIVE mg/dL   Urobilinogen, UA 1.0 0.0 - 1.0 mg/dL   Nitrite POSITIVE (A) NEGATIVE   Leukocytes, UA TRACE (A) NEGATIVE  Urine microscopic-add on   Collection Time: 03/19/14  2:23 PM  Result Value Ref Range   Squamous Epithelial / LPF RARE RARE   WBC, UA 0-2 <3 WBC/hpf   RBC / HPF 0-2 <3 RBC/hpf   Bacteria, UA RARE RARE  Results for orders placed or performed in visit on 11/10/13 (from the past 8736 hour(s))  Basic metabolic panel   Collection Time:  11/10/13 11:24 AM  Result Value Ref Range   Sodium 134 (L) 135 - 145 mEq/L   Potassium 4.1 3.5 - 5.1 mEq/L   Chloride 101 96 - 112 mEq/L   CO2 26 19 - 32 mEq/L   Glucose, Bld 92 70 - 99 mg/dL   BUN 12 6 - 23 mg/dL   Creatinine, Ser 1.2 0.4 - 1.2 mg/dL   Calcium 9.4 8.4 - 10.5 mg/dL   GFR 47.07 (L) >60.00 mL/min  TSH   Collection Time: 11/10/13 11:24 AM  Result Value Ref Range   TSH 0.49 0.35 - 4.50 uIU/mL  T4, free   Collection Time: 11/10/13 11:24 AM  Result Value Ref Range   Free T4 1.26 0.60 - 1.60 ng/dL  Results for orders placed or performed in visit on 10/14/13 (from the past 8736 hour(s))  TSH   Collection Time: 10/14/13  9:28 AM  Result Value Ref Range   TSH 0.30 (L) 0.35 - 4.50 uIU/mL  T4, free   Collection Time: 10/14/13  9:28 AM  Result Value Ref Range   Free T4 1.29 0.60 - 1.60 ng/dL  Results for orders placed or performed in visit on 10/13/13 (from the past 8736 hour(s))  Vit D  25 hydroxy (rtn osteoporosis monitoring)   Collection Time: 10/13/13  4:17 PM  Result Value Ref Range   VITD 54.60 30.00 - 100.00 ng/mL   Assessment:    AXIS I  major depression with psychotic features, rule out dementia   AXIS II Deferred  AXIS III Past Medical History  Diagnosis Date  . GERD (gastroesophageal reflux disease)     intermittent, treated with tums  . Hypothyroidism   . Scoliosis   . Hiatal hernia   . Adenomatous polyp     x1  . Lumbago   . Severe recurrent depression with psychosis   . Diverticulitis   . Personal history of kidney stones     x1  . ALLERGIC RHINITIS 12/20/2006  . Osteoporosis 2007    declined prolia  . Vitamin D deficiency   . Chronic anxiety   . Insomnia      AXIS IV  economic problems and other psychosocial or environmental problems  AXIS V 61-70 mild symptoms   Treatment Plan/Recommendations:  Laboratory:    Psychotherapy: supportive  Medications: Risperdal, Lamictal, Effexor  Routine PRN Medications:  No  Consultations: none   Safety Concerns:  none  Other:     Plan/Discussion: I took her vitals.  I reviewed CC, tobacco/med/surg Hx, meds effects/ side effects, problem list, therapies and responses as well as current situation/symptoms discussed options. Continue current medications. She will call if hallucinations recur. She'll return in 3 months See orders and pt instructions for more details.  MEDICATIONS this encounter: Meds ordered this encounter  Medications  . DISCONTD: venlafaxine (EFFEXOR) 37.5 MG tablet    Sig: Take 37.5 mg by mouth 2 (two) times daily.  Marland Kitchen venlafaxine (EFFEXOR) 37.5 MG tablet    Sig: Take 1 tablet (37.5 mg total) by mouth 2 (two) times daily.    Dispense:  180 tablet    Refill:  2  . clonazePAM (KLONOPIN) 0.5 MG tablet    Sig: Take 1 tablet (0.5 mg total) by mouth at bedtime.    Dispense:  90 tablet    Refill:  2    Medical Decision Making Problem Points:  Established problem, stable/improving (1), New problem, with no additional work-up planned (3), Review of last therapy session (1) and Review of psycho-social stressors (1) Data Points:  Review or order clinical lab tests (1) Review of medication regiment & side effects (2) Review of new medications or change in dosage (2)  four-week's I certify that outpatient services furnished can reasonably be expected to improve the patient's condition.   Levonne Spiller, MD

## 2014-04-11 ENCOUNTER — Ambulatory Visit (HOSPITAL_COMMUNITY): Payer: Self-pay | Admitting: Psychiatry

## 2014-04-13 ENCOUNTER — Telehealth (HOSPITAL_COMMUNITY): Payer: Self-pay | Admitting: *Deleted

## 2014-04-13 NOTE — Telephone Encounter (Signed)
Pt pharmacy requesting clarification for pt Effexor. Faxed Clarification form to pt pharmacy ([place]) [phone]

## 2014-04-22 DIAGNOSIS — H7291 Unspecified perforation of tympanic membrane, right ear: Secondary | ICD-10-CM | POA: Diagnosis not present

## 2014-04-22 DIAGNOSIS — H6121 Impacted cerumen, right ear: Secondary | ICD-10-CM | POA: Diagnosis not present

## 2014-04-29 DIAGNOSIS — K5900 Constipation, unspecified: Secondary | ICD-10-CM | POA: Diagnosis not present

## 2014-04-29 DIAGNOSIS — N952 Postmenopausal atrophic vaginitis: Secondary | ICD-10-CM | POA: Diagnosis not present

## 2014-04-29 DIAGNOSIS — R339 Retention of urine, unspecified: Secondary | ICD-10-CM | POA: Diagnosis not present

## 2014-04-29 DIAGNOSIS — N390 Urinary tract infection, site not specified: Secondary | ICD-10-CM | POA: Diagnosis not present

## 2014-05-03 ENCOUNTER — Encounter: Payer: Self-pay | Admitting: Family Medicine

## 2014-05-08 ENCOUNTER — Emergency Department (HOSPITAL_COMMUNITY): Payer: Medicare Other

## 2014-05-08 ENCOUNTER — Encounter (HOSPITAL_COMMUNITY): Payer: Self-pay | Admitting: *Deleted

## 2014-05-08 ENCOUNTER — Observation Stay (HOSPITAL_COMMUNITY)
Admission: EM | Admit: 2014-05-08 | Discharge: 2014-05-09 | Disposition: A | Discharge status: Home / Self Care | Payer: Medicare Other | Attending: Internal Medicine | Admitting: Internal Medicine

## 2014-05-08 DIAGNOSIS — K5641 Fecal impaction: Secondary | ICD-10-CM | POA: Diagnosis not present

## 2014-05-08 DIAGNOSIS — Z87442 Personal history of urinary calculi: Secondary | ICD-10-CM | POA: Insufficient documentation

## 2014-05-08 DIAGNOSIS — F419 Anxiety disorder, unspecified: Secondary | ICD-10-CM | POA: Diagnosis not present

## 2014-05-08 DIAGNOSIS — G20 Parkinson's disease: Secondary | ICD-10-CM | POA: Insufficient documentation

## 2014-05-08 DIAGNOSIS — Z79899 Other long term (current) drug therapy: Secondary | ICD-10-CM | POA: Insufficient documentation

## 2014-05-08 DIAGNOSIS — G4700 Insomnia, unspecified: Secondary | ICD-10-CM | POA: Insufficient documentation

## 2014-05-08 DIAGNOSIS — K5900 Constipation, unspecified: Secondary | ICD-10-CM | POA: Diagnosis present

## 2014-05-08 DIAGNOSIS — K5909 Other constipation: Secondary | ICD-10-CM | POA: Diagnosis not present

## 2014-05-08 DIAGNOSIS — E039 Hypothyroidism, unspecified: Secondary | ICD-10-CM | POA: Diagnosis not present

## 2014-05-08 DIAGNOSIS — R339 Retention of urine, unspecified: Secondary | ICD-10-CM | POA: Diagnosis not present

## 2014-05-08 DIAGNOSIS — K219 Gastro-esophageal reflux disease without esophagitis: Secondary | ICD-10-CM | POA: Diagnosis not present

## 2014-05-08 DIAGNOSIS — R338 Other retention of urine: Secondary | ICD-10-CM

## 2014-05-08 DIAGNOSIS — K649 Unspecified hemorrhoids: Secondary | ICD-10-CM | POA: Insufficient documentation

## 2014-05-08 DIAGNOSIS — Z91018 Allergy to other foods: Secondary | ICD-10-CM | POA: Diagnosis not present

## 2014-05-08 DIAGNOSIS — F333 Major depressive disorder, recurrent, severe with psychotic symptoms: Secondary | ICD-10-CM | POA: Insufficient documentation

## 2014-05-08 DIAGNOSIS — Z882 Allergy status to sulfonamides status: Secondary | ICD-10-CM | POA: Insufficient documentation

## 2014-05-08 DIAGNOSIS — E559 Vitamin D deficiency, unspecified: Secondary | ICD-10-CM | POA: Diagnosis not present

## 2014-05-08 DIAGNOSIS — Z885 Allergy status to narcotic agent status: Secondary | ICD-10-CM | POA: Diagnosis not present

## 2014-05-08 DIAGNOSIS — M810 Age-related osteoporosis without current pathological fracture: Secondary | ICD-10-CM | POA: Diagnosis not present

## 2014-05-08 DIAGNOSIS — Z880 Allergy status to penicillin: Secondary | ICD-10-CM | POA: Diagnosis not present

## 2014-05-08 LAB — BASIC METABOLIC PANEL
Anion gap: 4 — ABNORMAL LOW (ref 5–15)
BUN: 9 mg/dL (ref 6–23)
CO2: 28 mmol/L (ref 19–32)
CREATININE: 0.89 mg/dL (ref 0.50–1.10)
Calcium: 8.8 mg/dL (ref 8.4–10.5)
Chloride: 102 mmol/L (ref 96–112)
GFR calc Af Amer: 70 mL/min — ABNORMAL LOW (ref >90)
GFR calc non Af Amer: 60 mL/min — ABNORMAL LOW (ref >90)
Glucose, Bld: 86 mg/dL (ref 70–99)
Potassium: 3.8 mmol/L (ref 3.5–5.1)
Sodium: 134 mmol/L — ABNORMAL LOW (ref 135–145)

## 2014-05-08 LAB — CBC WITH DIFFERENTIAL/PLATELET
Basophils Absolute: 0 10*3/uL (ref 0.0–0.1)
Basophils Relative: 0 % (ref 0–1)
EOS ABS: 0.2 10*3/uL (ref 0.0–0.7)
Eosinophils Relative: 2 % (ref 0–5)
HEMATOCRIT: 37.6 % (ref 36.0–46.0)
HEMOGLOBIN: 12.2 g/dL (ref 12.0–15.0)
Lymphocytes Relative: 24 % (ref 12–46)
Lymphs Abs: 2.1 10*3/uL (ref 0.7–4.0)
MCH: 29 pg (ref 26.0–34.0)
MCHC: 32.4 g/dL (ref 30.0–36.0)
MCV: 89.3 fL (ref 78.0–100.0)
Monocytes Absolute: 0.8 10*3/uL (ref 0.1–1.0)
Monocytes Relative: 9 % (ref 3–12)
NEUTROS ABS: 5.6 10*3/uL (ref 1.7–7.7)
Neutrophils Relative %: 65 % (ref 43–77)
PLATELETS: 219 10*3/uL (ref 150–400)
RBC: 4.21 MIL/uL (ref 3.87–5.11)
RDW: 13.4 % (ref 11.5–15.5)
WBC: 8.7 10*3/uL (ref 4.0–10.5)

## 2014-05-08 LAB — URINALYSIS, ROUTINE W REFLEX MICROSCOPIC
Bilirubin Urine: NEGATIVE
Glucose, UA: NEGATIVE mg/dL
HGB URINE DIPSTICK: NEGATIVE
KETONES UR: NEGATIVE mg/dL
Leukocytes, UA: NEGATIVE
Nitrite: NEGATIVE
PROTEIN: NEGATIVE mg/dL
Specific Gravity, Urine: 1.009 (ref 1.005–1.030)
Urobilinogen, UA: 0.2 mg/dL (ref 0.0–1.0)
pH: 6 (ref 5.0–8.0)

## 2014-05-08 LAB — TSH: TSH: 0.481 u[IU]/mL (ref 0.350–4.500)

## 2014-05-08 MED ORDER — ACETAMINOPHEN 325 MG PO TABS
650.0000 mg | ORAL_TABLET | Freq: Four times a day (QID) | ORAL | Status: DC | PRN
  Administered 2014-05-08: 650 mg via ORAL
  Filled 2014-05-08: qty 2

## 2014-05-08 MED ORDER — LACTULOSE 10 GM/15ML PO SOLN: 10.0000 g | Freq: Two times a day (BID) | ORAL | Status: DC

## 2014-05-08 MED ORDER — RISPERIDONE 0.5 MG PO TABS
0.5000 mg | ORAL_TABLET | Freq: Every day | ORAL | Status: DC | PRN
  Filled 2014-05-08: qty 1

## 2014-05-08 MED ORDER — RISPERIDONE 1 MG PO TABS
1.0000 mg | ORAL_TABLET | Freq: Every day | ORAL | Status: DC
  Administered 2014-05-08: 1 mg via ORAL
  Filled 2014-05-08 (×2): qty 1

## 2014-05-08 MED ORDER — ENOXAPARIN SODIUM 40 MG/0.4ML ~~LOC~~ SOLN
40.0000 mg | Freq: Every day | SUBCUTANEOUS | Status: DC
  Administered 2014-05-08 – 2014-05-09 (×2): 40 mg via SUBCUTANEOUS
  Filled 2014-05-08 (×2): qty 0.4

## 2014-05-08 MED ORDER — BISACODYL 10 MG RE SUPP: 10.0000 mg | Freq: Every day | RECTAL | Status: DC | PRN

## 2014-05-08 MED ORDER — PSYLLIUM 95 % PO PACK
1.0000 | PACK | Freq: Two times a day (BID) | ORAL | Status: DC
  Administered 2014-05-09: 1 via ORAL
  Filled 2014-05-08 (×2): qty 1

## 2014-05-08 MED ORDER — CLONAZEPAM 0.5 MG PO TABS
0.5000 mg | ORAL_TABLET | Freq: Every day | ORAL | Status: DC
  Administered 2014-05-08: 0.5 mg via ORAL
  Filled 2014-05-08: qty 1

## 2014-05-08 MED ORDER — FLEET ENEMA 7-19 GM/118ML RE ENEM
1.0000 | ENEMA | Freq: Once | RECTAL | Status: AC | PRN
  Filled 2014-05-08: qty 1

## 2014-05-08 MED ORDER — POLYETHYLENE GLYCOL 3350 17 GM/SCOOP PO POWD
1.0000 | Freq: Once | ORAL | Status: AC
  Administered 2014-05-08: 255 g via ORAL
  Filled 2014-05-08: qty 255

## 2014-05-08 MED ORDER — MAGNESIUM HYDROXIDE 400 MG/5ML PO SUSP: 15.0000 mL | Freq: Every day | ORAL | Status: DC | PRN

## 2014-05-08 MED ORDER — ONDANSETRON HCL 4 MG/2ML IJ SOLN: 4.0000 mg | Freq: Four times a day (QID) | INTRAMUSCULAR | Status: DC | PRN

## 2014-05-08 MED ORDER — MINERAL OIL RE ENEM
1.0000 | ENEMA | Freq: Once | RECTAL | Status: AC
  Administered 2014-05-08: 1 via RECTAL
  Filled 2014-05-08: qty 1

## 2014-05-08 MED ORDER — PRAMOXINE HCL 1 % RE FOAM: 1.0000 "application " | Freq: Three times a day (TID) | RECTAL | Status: DC | PRN

## 2014-05-08 MED ORDER — ONDANSETRON HCL 4 MG PO TABS: 4.0000 mg | ORAL_TABLET | Freq: Four times a day (QID) | ORAL | Status: DC | PRN

## 2014-05-08 MED ORDER — HYDROCORTISONE ACETATE 25 MG RE SUPP
25.0000 mg | Freq: Two times a day (BID) | RECTAL | Status: DC
  Administered 2014-05-08 – 2014-05-09 (×3): 25 mg via RECTAL
  Filled 2014-05-08 (×4): qty 1

## 2014-05-08 MED ORDER — LEVOTHYROXINE SODIUM 88 MCG PO TABS
88.0000 ug | ORAL_TABLET | Freq: Every day | ORAL | Status: DC
  Administered 2014-05-08 – 2014-05-09 (×2): 88 ug via ORAL
  Filled 2014-05-08 (×2): qty 1

## 2014-05-08 MED ORDER — LACTULOSE 10 GM/15ML PO SOLN
10.0000 g | Freq: Once | ORAL | Status: AC
  Administered 2014-05-08: 10 g via ORAL
  Filled 2014-05-08 (×2): qty 15

## 2014-05-08 MED ORDER — SENNOSIDES-DOCUSATE SODIUM 8.6-50 MG PO TABS
1.0000 | ORAL_TABLET | Freq: Two times a day (BID) | ORAL | Status: DC
  Administered 2014-05-09: 1 via ORAL
  Filled 2014-05-08 (×2): qty 1

## 2014-05-08 MED ORDER — VENLAFAXINE HCL 37.5 MG PO TABS
37.5000 mg | ORAL_TABLET | Freq: Two times a day (BID) | ORAL | Status: DC
  Administered 2014-05-08 – 2014-05-09 (×3): 37.5 mg via ORAL
  Filled 2014-05-08 (×3): qty 1

## 2014-05-08 MED ORDER — ACETAMINOPHEN 650 MG RE SUPP: 650.0000 mg | Freq: Four times a day (QID) | RECTAL | Status: DC | PRN

## 2014-05-08 NOTE — Plan of Care (Signed)
Problem: Phase I Progression Outcomes Goal: OOB as tolerated unless otherwise ordered Outcome: Completed/Met Date Met:  05/08/14 Has been up and down in room all day

## 2014-05-08 NOTE — ED Notes (Signed)
The pt has not been able to empty her bladder since 1800.  She last had a small amount of urine 30 minutes ago.  She has a history of the same.  2 weeks ago she had a foley cath placed for the same problem

## 2014-05-08 NOTE — Plan of Care (Signed)
Problem: Phase I Progression Outcomes Goal: Voiding-avoid urinary catheter unless indicated Outcome: Not Progressing Foley catheter placed due to inability to urinate with acute urinary retention

## 2014-05-08 NOTE — ED Notes (Signed)
Pt states she has been having urinary urgency and pain in lower abdomen. States about 50 minutes ago she stood up and urinated "alot" in her depends. Denies feeling like her bladder is full. Pt states she had similar issues a few weeks ago, had kidney work up with urologist and everything checked out fine. Reports also having issues with hemorrhoids lately, is afraid she may have an infection.

## 2014-05-08 NOTE — ED Provider Notes (Signed)
CSN: [id number]     Arrival date & time 05/08/14  0036 History  This chart was scribed for Linton Flemings, MD by Rayfield Citizen, ED Scribe. This patient was seen in room A04C/A04C and the patient's care was started at 2:01 AM.    Chief Complaint  Patient presents with  . unable to void    The history is provided by the patient. No language interpreter was used.     HPI Comments: Harvey Lingo is a 81 y.o. female who presents to the Emergency Department complaining of difficulty urinating. Patient explains she had difficulty voiding around 18:00 last night; she had a small amount of urine at that time, and about an hour PTA was able to urinate while standing and wearing a Depends. Per nurse's note, patient felt some relief at that time. She notes associated lower abdominal pain and what she describes as "vaginal pain."  She was seen in January by Dr. Ashok Cordia for the same issue; she states she was sent home with a catheter at that time. She was recommended to have a "full kidney work up" and saw Dr. Erlene Quan in [place] for this; patient reports all resulted "fine."    She denies a personal history of prolapse; she notes a history of painful hemorrhoids, but these are managed by her PCP and OTC creams. Patient states she takes Dulcolax stool softeners daily for occasional constipation.    Past Medical History  Diagnosis Date  . GERD (gastroesophageal reflux disease)     intermittent, treated with tums  . Hypothyroidism   . Scoliosis   . Hiatal hernia   . Adenomatous polyp     x1  . Lumbago   . Severe recurrent depression with psychosis   . Diverticulitis   . Personal history of kidney stones     x1  . ALLERGIC RHINITIS 12/20/2006  . Osteoporosis 2007    declined prolia  . Vitamin D deficiency   . Chronic anxiety   . Insomnia   . Postmenopausal atrophic vaginitis 2016    rec estrace cream - [place]  . Recurrent UTI    Past Surgical History  Procedure Laterality Date  . Appendectomy   1954  . Cholecystectomy    . Abdominal hysterectomy  1979    ectopic pregnancy with one ovary removed  . Tonsillectomy    . Oophorectomy Right 1965    Ectopic pregnancy, removal of right ovary and tube- 1960  . Esophagogastroduodenoscopy    . Rotator cuff repair Right   . Knee surgery Left 2012    L medial meniscal tear   Family History  Problem Relation Age of Onset  . Schizophrenia Sister   . Alcohol abuse Neg Hx   . Bipolar disorder Neg Hx   . Dementia Neg Hx   . Drug abuse Neg Hx   . OCD Neg Hx   . Paranoid behavior Neg Hx   . Seizures Neg Hx   . Sexual abuse Neg Hx   . Physical abuse Neg Hx   . Anxiety disorder Grandchild   . Depression Grandchild   . Cancer Sister     lung  . Healthy Sister   . Diabetes Mother   . Diabetes Father   . Diabetes Sister   . Diabetes Brother   . Cancer Sister     kidney   History  Substance Use Topics  . Smoking status: Never Smoker   . Smokeless tobacco: Never Used  . Alcohol Use: No   OB  History    No data available     Review of Systems  Gastrointestinal: Positive for abdominal pain and rectal pain.  Genitourinary: Positive for difficulty urinating and vaginal pain.  All other systems reviewed and are negative.   Allergies  Amoxicillin; Morphine and related; Sulfonamide derivatives; and Valium  Home Medications   Prior to Admission medications   Medication Sig Start Date End Date Taking? Authorizing Provider  albuterol (PROVENTIL HFA;VENTOLIN HFA) 108 (90 BASE) MCG/ACT inhaler Inhale 2 puffs into the lungs every 6 (six) hours as needed (cough). 05/07/13   Ria Bush, MD  benztropine (COGENTIN) 0.5 MG tablet Take 1 tablet (0.5 mg total) by mouth 3 (three) times daily as needed for tremors. 01/07/14   Cloria Spring, MD  clonazePAM (KLONOPIN) 0.5 MG tablet Take 1 tablet (0.5 mg total) by mouth at bedtime. 04/08/14 04/08/15  Cloria Spring, MD  dicyclomine (BENTYL) 10 MG capsule Take 10 mg by mouth daily as needed.  For diverticulitis    Historical Provider, MD  fluticasone (FLONASE) 50 MCG/ACT nasal spray Place 2 sprays into both nostrils daily as needed for allergies or rhinitis.    Historical Provider, MD  lamoTRIgine (LAMICTAL) 25 MG tablet Take 2 tablets (50 mg total) by mouth 2 (two) times daily. 01/07/14   Cloria Spring, MD  levothyroxine (SYNTHROID, LEVOTHROID) 88 MCG tablet TAKE ONE TABLET EVERY MORNING ( NEEDS OFFICE VISIT AFTER ONE REFILL) 04/06/14   Elayne Snare, MD  montelukast (SINGULAIR) 10 MG tablet Take 1 tablet (10 mg total) by mouth at bedtime. 01/18/14   Ria Bush, MD  risperiDONE (RISPERDAL) 0.25 MG tablet Take 3 tablets (0.75 mg total) by mouth daily as needed (drop in mood). 01/07/14   Cloria Spring, MD  risperiDONE (RISPERDAL) 1 MG tablet Take 1 tablet (1 mg total) by mouth 2 (two) times daily. 01/07/14   Cloria Spring, MD  venlafaxine (EFFEXOR) 37.5 MG tablet Take 1 tablet (37.5 mg total) by mouth 2 (two) times daily. 04/08/14   Cloria Spring, MD  Vitamin D, Ergocalciferol, (DRISDOL) 50000 UNITS CAPS capsule Take 1 capsule (50,000 Units total) by mouth every 7 (seven) days. 12/27/13   Elayne Snare, MD   BP 119/59 mmHg  Pulse 81  Temp(Src) 98.1 F (36.7 C) (Oral)  Resp 20  SpO2 95%  LMP 06/18/1977 Physical Exam  Constitutional: She is oriented to person, place, and time. She appears well-developed and well-nourished. No distress.  HENT:  Head: Normocephalic and atraumatic.  Mouth/Throat: Oropharynx is clear and moist. No oropharyngeal exudate.  Moist mucous membranes  Eyes: EOM are normal. Pupils are equal, round, and reactive to light.  Neck: Normal range of motion. Neck supple. No JVD present.  Cardiovascular: Normal rate, regular rhythm and normal heart sounds.  Exam reveals no gallop and no friction rub.   No murmur heard. Pulmonary/Chest: Effort normal and breath sounds normal. No respiratory distress. She has no wheezes. She has no rales.  Abdominal: Soft. Bowel  sounds are normal. She exhibits distension (Some lower abdominal distension and pain). She exhibits no mass. There is no tenderness. There is no rebound and no guarding.  Genitourinary:  Fecal impaction but soft stool on rectal exam  Musculoskeletal: Normal range of motion. She exhibits no edema.  Moves all extremities normally.   Lymphadenopathy:    She has no cervical adenopathy.  Neurological: She is alert and oriented to person, place, and time. She displays normal reflexes.  Skin: Skin is warm and dry.  No rash noted.  Psychiatric: She has a normal mood and affect. Her behavior is normal.  Nursing note and vitals reviewed.   ED Course  Fecal disimpaction Date/Time: 05/08/2014 2:15 AM Performed by: Linton Flemings Authorized by: Linton Flemings Consent: Verbal consent obtained. Comments: On rectal exam, pt with large soft stool in rectum.  Fecal disimpaction done, 4 large stool balls removed, plan for mineral oil enema     DIAGNOSTIC STUDIES: Oxygen Saturation is 96% on RA, adequate by my interpretation.    COORDINATION OF CARE: 2:08 AM Discussed treatment plan with pt at bedside and pt agreed to plan.   Labs Review Labs Reviewed  BASIC METABOLIC PANEL - Abnormal; Notable for the following:    Sodium 134 (*)    GFR calc non Af Amer 60 (*)    GFR calc Af Amer 70 (*)    Anion gap 4 (*)    All other components within normal limits  URINE CULTURE  CBC WITH DIFFERENTIAL/PLATELET  URINALYSIS, ROUTINE W REFLEX MICROSCOPIC    Imaging Review Dg Abd 1 View  05/08/2014   CLINICAL DATA:  Urinary retention, fecal impaction  EXAM: ABDOMEN - 1 VIEW  COMPARISON:  None.  FINDINGS: There is a large rectal stool mass measuring approximately 12 cm in cross-section, consistent with fecal impaction. There is a generous volume of air throughout nondistended small bowel. There is no free intraperitoneal air. No biliary or urinary calculi are evident.  IMPRESSION: Fecal impaction   Electronically  Signed   By: Andreas Newport M.D.   On: 05/08/2014 03:12     EKG Interpretation None      MDM   Final diagnoses:  Urinary retention  Constipation, unspecified constipation type  Hemorrhoids, unspecified hemorrhoid type    I personally performed the services described in this documentation, which was scribed in my presence. The recorded information has been reviewed and is accurate.  81 year old female with urinary retention tonight, lower abdominal distention and pressure.  Similar episode in January.  Patient also reports constipation and hemorrhoids.  She has plans to follow-up with general surgery, Dr. Sharen Counter on Monday for follow-up on her hemorrhoids.  She has been using over-the-counter medications.  She also reports that she is taking a stool softener.  She reports that Maalox has not helped her with constipation in the past.  On physical exam, she has lower abdominal distention and pain.  Rectal exam with a large amount of soft stool in her rectal vault.  A moderate amount of stool was removed by digital disimpaction.  Plan for enema.  I suspect that given her large fecal impaction.  She is having some urinary obstruction from this.  I hope that with a good cleanout here in the emergency department, she should be able to urinate on her own.   4:38 AM Patient has had minimal benefit from mineral oil suppository.  X-ray shows patient has remaining large fecal impaction.  Will order soapsuds enema.  As patient is still unable to urinate, feel she needs admission to the hospital for a general bowel cleanout.  Will discuss with hospitalist.  Linton Flemings, MD 05/08/14 [phone]

## 2014-05-08 NOTE — ED Notes (Signed)
MD at bedside. 

## 2014-05-08 NOTE — Progress Notes (Signed)
[facility]                                                                              Patient Demographics  Jamie Pitts, is a 81 y.o. female, DOB - 10/04/1933, IRJ:[id number]  Admit date - 05/08/2014   Admitting Physician Lavina Hamman, MD  Outpatient Primary MD for the patient is Ria Bush, MD  LOS -    Chief Complaint  Patient presents with  . unable to void       Interim history Patient admitted early this morning by Dr. Posey Pronto. Agree with assessment and plan.  81 year old female past medical history GERD, Parkinson's, depression, hypothyroidism, chronic constipation presented to the emergency department with inability to 4 weeks. Patient has had these symptoms before and is due to her constipation. Patient was last seen in emergency department in January and required Foley catheter at that time. At this time, patient does not wish to have fully catheter. Feels that once her constipation has resolved or improved, she'll be able to void again. Patient has had manual disimpaction.  Assessment & Plan   Chronic constipation  -Continue bowel regimen with Maalox, milk of magnesia, Senokot, Metamucil -Patient may benefit from outpatient gastroenterology workup -Imaging shows fecal impaction, patient was manually disimpacted the emergency department. -Of note, patient is supposed to see Dr. gross on April 5 for evaluation of hemorrhoids  Urinary retention -may need foley catheter  Depression/Mood disorder -Continue home meds  Hypothyroidism -TSH pending, continue synthroid  History of parkinson -Continue benztropine- possibly contributing to constipation  Code Status: Full  Family Communication: None at bedside  Disposition Plan: Admitted this morning, will likely be discharged 3/21.  Time Spent in minutes   30 minutes  Procedures  None  Consults   None  DVT Prophylaxis  Lovenox  Lab Results  Component Value Date   PLT 219 05/08/2014     Medications  Scheduled Meds: . clonazePAM  0.5 mg Oral QHS  . enoxaparin (LOVENOX) injection  40 mg Subcutaneous Daily  . hydrocortisone  25 mg Rectal BID  . levothyroxine  88 mcg Oral QAC breakfast  . [START ON 05/09/2014] psyllium  1 packet Oral BID  . risperiDONE  1 mg Oral QHS  . [START ON 05/09/2014] senna-docusate  1 tablet Oral BID  . venlafaxine  37.5 mg Oral BID WC   Continuous Infusions:  PRN Meds:.acetaminophen **OR** acetaminophen, bisacodyl, [START ON 05/09/2014] magnesium hydroxide, ondansetron **OR** ondansetron (ZOFRAN) IV, risperiDONE, sodium phosphate  Antibiotics    Anti-infectives    None       Subjective:   Jamie Pitts seen and examined today.  Patient states that she continues to have constipation however feels somewhat better after being manually disimpacted. No compressive shortness of breath, chest pain, headache, dizziness.  Objective:   Filed Vitals:   05/08/14 0115 05/08/14 0500 05/08/14 0530 05/08/14 0616  BP: 119/59 140/72 145/68 154/78  Pulse: 81 77 73 88  Temp:    98 F (36.7 C)  TempSrc:    Axillary  Resp:  16  16  SpO2: 95% 96% 97% 96%    Wt Readings from Last 3 Encounters:  04/08/14 71.124 kg (156 lb  12.8 oz)  04/05/14 71.442 kg (157 lb 8 oz)  01/18/14 74.844 kg (165 lb)     Intake/Output Summary (Last 24 hours) at 05/08/14 1103 Last data filed at 05/08/14 5053  Gross per 24 hour  Intake    360 ml  Output    900 ml  Net   -540 ml    Exam  General: Well developed, well nourished, NAD, appears stated age  69: NCAT, mucous membranes moist.   Cardiovascular: S1 S2 auscultated, RRR  Respiratory: Clear to auscultation bilaterally with equal chest rise  Abdomen: Soft, nontender, distended, + bowel sounds  Extremities: warm dry without cyanosis clubbing or edema  Neuro: AAOx3, nonfocal  Psych: Appropriate    Data Review   Micro Results No results found for this or any previous visit (from the past 240  hour(s)).  Radiology Reports Dg Abd 1 View  05/08/2014   CLINICAL DATA:  Urinary retention, fecal impaction  EXAM: ABDOMEN - 1 VIEW  COMPARISON:  None.  FINDINGS: There is a large rectal stool mass measuring approximately 12 cm in cross-section, consistent with fecal impaction. There is a generous volume of air throughout nondistended small bowel. There is no free intraperitoneal air. No biliary or urinary calculi are evident.  IMPRESSION: Fecal impaction   Electronically Signed   By: Andreas Newport M.D.   On: 05/08/2014 03:12    CBC  Recent Labs Lab 05/08/14 0109  WBC 8.7  HGB 12.2  HCT 37.6  PLT 219  MCV 89.3  MCH 29.0  MCHC 32.4  RDW 13.4  LYMPHSABS 2.1  MONOABS 0.8  EOSABS 0.2  BASOSABS 0.0    Chemistries   Recent Labs Lab 05/08/14 0109  NA 134*  K 3.8  CL 102  CO2 28  GLUCOSE 86  BUN 9  CREATININE 0.89  CALCIUM 8.8   ------------------------------------------------------------------------------------------------------------------ CrCl cannot be calculated (Unknown ideal weight.). ------------------------------------------------------------------------------------------------------------------ No results for input(s): HGBA1C in the last 72 hours. ------------------------------------------------------------------------------------------------------------------ No results for input(s): CHOL, HDL, LDLCALC, TRIG, CHOLHDL, LDLDIRECT in the last 72 hours. ------------------------------------------------------------------------------------------------------------------ No results for input(s): TSH, T4TOTAL, T3FREE, THYROIDAB in the last 72 hours.  Invalid input(s): FREET3 ------------------------------------------------------------------------------------------------------------------ No results for input(s): VITAMINB12, FOLATE, FERRITIN, TIBC, IRON, RETICCTPCT in the last 72 hours.  Coagulation profile No results for input(s): INR, PROTIME in the last 168  hours.  No results for input(s): DDIMER in the last 72 hours.  Cardiac Enzymes No results for input(s): CKMB, TROPONINI, MYOGLOBIN in the last 168 hours.  Invalid input(s): CK ------------------------------------------------------------------------------------------------------------------ Invalid input(s): POCBNP    Jamie Pitts D.O. on 05/08/2014 at 11:03 AM  Between 7am to 7pm - Pager - [phone]  After 7pm go to [email or web address] - password [email or web address]  And look for the night coverage person covering for me after hours  [facility] Office  [phone]

## 2014-05-08 NOTE — ED Notes (Signed)
Patient stating she does not feel like her enema is working. Pt has had very small bm this far. Advised patient that it may take more time for it to work.

## 2014-05-08 NOTE — Progress Notes (Signed)
UR completed 

## 2014-05-08 NOTE — Progress Notes (Deleted)
UR complicated 

## 2014-05-08 NOTE — H&P (Addendum)
[facility] History and Physical  Patient: Jamie Pitts  MRN: [id number]  DOB: August 23, 1933  DOS: the patient was seen and examined on 05/08/2014 PCP: Ria Bush, MD  Chief Complaint: Unable to void  HPI: Jamie Pitts is a 81 y.o. female with Past medical history of GERD, hypothyroidism, Parkinson's, depression. The patient presents with complaints of difficulty voiding. Patient mentions this symptoms started in the earlier evening. She complains of some lower abdominal pain. She had similar presentation earlier and was seen in the ER and later on had a referral to urologist and apparently her workup was unremarkable. She takes stool softener for occasional constipation. She mentions she has hemorrhoids and has an appointment on April 5 for that. She does not remember all of her medication and mentions she is not taking Lamictal. She mentions stool softeners does not work for her and therefore she is not taking them unclear whether she's taking a proper bowel regimen or just 1 stool softener to time. She mentions that sometimes he has gone 3 days without bowel movement currently she has not had any bowel movement for last 7 days.  The patient is coming from home And at her baseline independent for most of her ADL.  Review of Systems: as mentioned in the history of present illness.  A Comprehensive review of the other systems is negative.  Past Medical History  Diagnosis Date  . GERD (gastroesophageal reflux disease)     intermittent, treated with tums  . Hypothyroidism   . Scoliosis   . Hiatal hernia   . Adenomatous polyp     x1  . Lumbago   . Severe recurrent depression with psychosis   . Diverticulitis   . Personal history of kidney stones     x1  . ALLERGIC RHINITIS 12/20/2006  . Osteoporosis 2007    declined prolia  . Vitamin D deficiency   . Chronic anxiety   . Insomnia   . Postmenopausal atrophic vaginitis 2016    rec estrace cream - [place]  .  Recurrent UTI    Past Surgical History  Procedure Laterality Date  . Appendectomy  1954  . Cholecystectomy    . Abdominal hysterectomy  1979    ectopic pregnancy with one ovary removed  . Tonsillectomy    . Oophorectomy Right 1965    Ectopic pregnancy, removal of right ovary and tube- 1960  . Esophagogastroduodenoscopy    . Rotator cuff repair Right   . Knee surgery Left 2012    L medial meniscal tear   Social History:  reports that she has never smoked. She has never used smokeless tobacco. She reports that she does not drink alcohol or use illicit drugs.  Allergies  Allergen Reactions  . Amoxicillin Hives    Sores in mouth  . Morphine And Related Itching    Can tolerate if necessary  . Sulfonamide Derivatives Hives    Sores in mouth  . Valium [Diazepam] Itching    Can tolerate if necessary    Family History  Problem Relation Age of Onset  . Schizophrenia Sister   . Alcohol abuse Neg Hx   . Bipolar disorder Neg Hx   . Dementia Neg Hx   . Drug abuse Neg Hx   . OCD Neg Hx   . Paranoid behavior Neg Hx   . Seizures Neg Hx   . Sexual abuse Neg Hx   . Physical abuse Neg Hx   . Anxiety disorder Grandchild   . Depression Grandchild   .  Cancer Sister     lung  . Healthy Sister   . Diabetes Mother   . Diabetes Father   . Diabetes Sister   . Diabetes Brother   . Cancer Sister     kidney    Prior to Admission medications   Medication Sig Start Date End Date Taking? Authorizing Provider  albuterol (PROVENTIL HFA;VENTOLIN HFA) 108 (90 BASE) MCG/ACT inhaler Inhale 2 puffs into the lungs every 6 (six) hours as needed (cough). 05/07/13  Yes Ria Bush, MD  benztropine (COGENTIN) 0.5 MG tablet Take 1 tablet (0.5 mg total) by mouth 3 (three) times daily as needed for tremors. 01/07/14  Yes Cloria Spring, MD  Calcium Carbonate-Vitamin D (CALCIUM-D PO) Take 1 tablet by mouth daily.   Yes Historical Provider, MD  clonazePAM (KLONOPIN) 0.5 MG tablet Take 1 tablet (0.5 mg  total) by mouth at bedtime. 04/08/14 04/08/15 Yes Cloria Spring, MD  dicyclomine (BENTYL) 10 MG capsule Take 10 mg by mouth daily as needed for spasms. For diverticulitis   Yes Historical Provider, MD  fluticasone (FLONASE) 50 MCG/ACT nasal spray Place 2 sprays into both nostrils daily as needed for allergies or rhinitis.   Yes Historical Provider, MD  lamoTRIgine (LAMICTAL) 25 MG tablet Take 2 tablets (50 mg total) by mouth 2 (two) times daily. 01/07/14  Yes Cloria Spring, MD  levothyroxine (SYNTHROID, LEVOTHROID) 88 MCG tablet TAKE ONE TABLET EVERY MORNING ( NEEDS OFFICE VISIT AFTER ONE REFILL) 04/06/14  Yes Elayne Snare, MD  montelukast (SINGULAIR) 10 MG tablet Take 1 tablet (10 mg total) by mouth at bedtime. 01/18/14  Yes Ria Bush, MD  risperiDONE (RISPERDAL) 0.25 MG tablet Take 3 tablets (0.75 mg total) by mouth daily as needed (drop in mood). Patient taking differently: Take 0.5 mg by mouth daily as needed (drop in mood).  01/07/14  Yes Cloria Spring, MD  risperiDONE (RISPERDAL) 1 MG tablet Take 1 tablet (1 mg total) by mouth 2 (two) times daily. Patient taking differently: Take 1 mg by mouth at bedtime.  01/07/14  Yes Cloria Spring, MD  venlafaxine (EFFEXOR) 37.5 MG tablet Take 1 tablet (37.5 mg total) by mouth 2 (two) times daily. 04/08/14  Yes Cloria Spring, MD  Vitamin D, Ergocalciferol, (DRISDOL) 50000 UNITS CAPS capsule Take 1 capsule (50,000 Units total) by mouth every 7 (seven) days. 12/27/13  Yes Elayne Snare, MD  lactulose (CHRONULAC) 10 GM/15ML solution Take 15 mLs (10 g total) by mouth 2 (two) times daily. 05/08/14   Linton Flemings, MD  pramoxine (PROCTOFOAM) 1 % foam Place 1 application rectally 3 (three) times daily as needed for itching. 05/08/14   Linton Flemings, MD    Physical Exam: Filed Vitals:   05/08/14 0115 05/08/14 0500 05/08/14 0530 05/08/14 0616  BP: 119/59 140/72 145/68 154/78  Pulse: 81 77 73 88  Temp:    98 F (36.7 C)  TempSrc:    Axillary  Resp:  16  16  SpO2:  95% 96% 97% 96%    General: Alert, Awake and Oriented to Time, Place and Person. Appear in mild distress Eyes: PERRL ENT: Oral Mucosa clear moist. Neck: no JVD Cardiovascular: S1 and S2 Present, no Murmur, Peripheral Pulses Present Respiratory: Bilateral Air entry equal and Decreased, Clear to Auscultation, onCrackles, no wheezes Abdomen: Bowel Sound present, Soft and non tender, distended Skin: no Rash Extremities: no Pedal edema, no calf tenderness Neurologic: Grossly no focal neuro deficit.  Labs on Admission:  CBC:  Recent Labs  Lab 05/08/14 0109  WBC 8.7  NEUTROABS 5.6  HGB 12.2  HCT 37.6  MCV 89.3  PLT 219    CMP     Component Value Date/Time   NA 134* 05/08/2014 0109   K 3.8 05/08/2014 0109   CL 102 05/08/2014 0109   CO2 28 05/08/2014 0109   GLUCOSE 86 05/08/2014 0109   BUN 9 05/08/2014 0109   CREATININE 0.89 05/08/2014 0109   CALCIUM 8.8 05/08/2014 0109   PROT 6.6 03/19/2014 1415   ALBUMIN 3.4* 03/19/2014 1415   AST 15 03/19/2014 1415   ALT 10 03/19/2014 1415   ALKPHOS 66 03/19/2014 1415   BILITOT 0.9 03/19/2014 1415   GFRNONAA 60* 05/08/2014 0109   GFRAA 70* 05/08/2014 0109    No results for input(s): LIPASE, AMYLASE in the last 168 hours.  No results for input(s): CKTOTAL, CKMB, CKMBINDEX, TROPONINI in the last 168 hours. BNP (last 3 results) No results for input(s): BNP in the last 8760 hours.  ProBNP (last 3 results) No results for input(s): PROBNP in the last 8760 hours.   Radiological Exams on Admission: Dg Abd 1 View  05/08/2014   CLINICAL DATA:  Urinary retention, fecal impaction  EXAM: ABDOMEN - 1 VIEW  COMPARISON:  None.  FINDINGS: There is a large rectal stool mass measuring approximately 12 cm in cross-section, consistent with fecal impaction. There is a generous volume of air throughout nondistended small bowel. There is no free intraperitoneal air. No biliary or urinary calculi are evident.  IMPRESSION: Fecal impaction   Electronically  Signed   By: Andreas Newport M.D.   On: 05/08/2014 03:12    Assessment/Plan Principal Problem:   Constipation Active Problems:   Hypothyroidism   Depression, major, recurrent, severe with psychosis   GERD   Acute retention of urine   1. Constipation The patient is presenting with complaints of urinary retention. Workup shows that she has fecal impaction. Patient was manually disimpacted in the ER. Currently her urinary retention is most likely secondary to her severe constipation. Patient will be admitted in the hospital for observation to work on her constipation. Continue enema miralax, milk of magnesia Would recommend daily use of Metamucil, Senokot-S and milk of magnesia as needed  2. Urinary retention Foley catheter insertion.  3. Depression and mood disorder. Continue home medication.  4. Hypothyroidism Check TSH Continue Levothyroxine  5. History of parkinsonism. Patient is on benztropine. Could also potentially contributory to her constipation.  Advance goals of care discussion: Full code  DVT Prophylaxis: subcutaneous Heparin. Nutrition: Regular diet  Disposition: Admitted to observation in med-surge unit.  Author: Berle Mull, MD [facility] Pager: [phone] 05/08/2014, 6:32 AM    If 7PM-7AM, please contact night-coverage [email or web address] Password [email or web address]

## 2014-05-09 ENCOUNTER — Observation Stay (HOSPITAL_COMMUNITY): Payer: Medicare Other

## 2014-05-09 DIAGNOSIS — R339 Retention of urine, unspecified: Secondary | ICD-10-CM | POA: Diagnosis not present

## 2014-05-09 DIAGNOSIS — R338 Other retention of urine: Secondary | ICD-10-CM | POA: Diagnosis not present

## 2014-05-09 DIAGNOSIS — E039 Hypothyroidism, unspecified: Secondary | ICD-10-CM | POA: Diagnosis not present

## 2014-05-09 DIAGNOSIS — K5641 Fecal impaction: Secondary | ICD-10-CM

## 2014-05-09 DIAGNOSIS — F333 Major depressive disorder, recurrent, severe with psychotic symptoms: Secondary | ICD-10-CM | POA: Diagnosis not present

## 2014-05-09 LAB — URINE CULTURE
CULTURE: NO GROWTH
Colony Count: NO GROWTH

## 2014-05-09 MED ORDER — BISACODYL 10 MG RE SUPP: 10.0000 mg | Freq: Every day | RECTAL | Status: DC | PRN

## 2014-05-09 MED ORDER — POLYETHYLENE GLYCOL 3350 17 G PO PACK: 17.0000 g | PACK | Freq: Two times a day (BID) | ORAL | Status: DC

## 2014-05-09 MED ORDER — RISPERIDONE 1 MG PO TABS: 1.0000 mg | ORAL_TABLET | Freq: Every day | ORAL | Status: DC

## 2014-05-09 MED ORDER — SENNA 8.6 MG PO TABS: 2.0000 | ORAL_TABLET | Freq: Every day | ORAL | Status: DC

## 2014-05-09 MED ORDER — RISPERIDONE 0.25 MG PO TABS: 0.5000 mg | ORAL_TABLET | Freq: Every day | ORAL | Status: DC | PRN

## 2014-05-09 MED ORDER — HYDROCORTISONE ACETATE 25 MG RE SUPP: 25.0000 mg | Freq: Two times a day (BID) | RECTAL | Status: DC | PRN

## 2014-05-09 NOTE — Progress Notes (Signed)
D/c to home with family via wheelchair.  VSS pain denied.  Breathing regular and unlabored on room air.  Instructions and prescriptions  given and understood by patient.

## 2014-05-09 NOTE — Discharge Instructions (Signed)
Fecal Impaction A fecal impaction happens when there is a large, firm amount of stool (or feces) that cannot be passed. The impacted stool is usually in the rectum, which is the lowest part of the large bowel. The impacted stool can block the colon and cause significant problems. CAUSES  The longer stool stays in the rectum, the harder it gets. Anything that slows down your bowel movements can lead to fecal impaction, such as:  Constipation. This can be a long-standing (chronic) problem or can happen suddenly (acute).  Painful conditions of the rectum, such as hemorrhoids or anal fissures. The pain of these conditions can make you try to avoid having bowel movements.  Narcotic pain-relieving medicines, such as methadone, morphine, or codeine.  Not drinking enough fluids.  Inactivity and bed rest over long periods of time.  Diseases of the brain or nervous system that damage the nerves controlling the muscles of the intestines. SIGNS AND SYMPTOMS   Lack of normal bowel movements or changes in bowel patterns.  Sense of fullness in the rectum but unable to pass stool.  Pain or cramps in the abdominal area (often after meals).  Thin, watery discharge from the rectum. DIAGNOSIS  Your health care provider may suspect that you have a fecal impaction based on your symptoms and a physical exam. This will include an exam of your rectum. Sometimes X-rays or lab testing may be needed to confirm the diagnosis and to be sure there are no other problems.  TREATMENT   Initially an impaction can be removed manually. Using a gloved finger, your health care provider can remove hard stool from your rectum.  Medicine is sometimes needed. A suppository or enema can be given in the rectum to soften the stool, which can stimulate a bowel movement. Medicines can also be given by mouth (orally).  Though rare, surgery may be needed if the colon has torn (perforated) due to blockage. HOME CARE INSTRUCTIONS    Develop regular bowel habits. This could include getting in the habit of having a bowel movement after your morning cup of coffee or after eating. Be sure to allow yourself enough time on the toilet.  Maintain a high-fiber diet.  Drink enough fluids to keep your urine clear or pale yellow as directed by your health care provider.  Exercise regularly.  If you begin to get constipated, increase the amount of fiber in your diet. Eat plenty of fruits, vegetables, whole wheat breads, bran, oatmeal, and similar products.  Take natural fiber laxatives or other laxatives only as directed by your health care provider. SEEK MEDICAL CARE IF:   You have ongoing rectal pain.  You require enemas or suppositories more than twice a week.  You have rectal bleeding.  You have continued problems, or you develop abdominal pain.  You have thin, pencil-like stools. SEEK IMMEDIATE MEDICAL CARE IF:  You have black or tarry stools. MAKE SURE YOU:   Understand these instructions.  Will watch your condition.  Will get help right away if you are not doing well or get worse. Document Released: 10/28/2003 Document Revised: 11/25/2012 Document Reviewed: 08/11/2012 [facility] Patient Information 2015 [place], [place]. This information is not intended to replace advice given to you by your health care provider. Make sure you discuss any questions you have with your health care provider.   Hemorrhoids Hemorrhoids are puffy (swollen) veins around the rectum or anus. Hemorrhoids can cause pain, itching, bleeding, or irritation. HOME CARE  Eat foods with fiber, such as whole  grains, beans, nuts, fruits, and vegetables. Ask your doctor about taking products with added fiber in them (fibersupplements).  Drink enough fluid to keep your pee (urine) clear or pale yellow.  Exercise often.  Go to the bathroom when you have the urge to poop. Do not wait.  Avoid straining to poop (bowel movement).  Keep the  butt area dry and clean. Use wet toilet paper or moist paper towels.  Medicated creams and medicine inserted into the anus (anal suppository) may be used or applied as told.  Only take medicine as told by your doctor.  Take a warm water bath (sitz bath) for 15-20 minutes to ease pain. Do this 3-4 times a day.  Place ice packs on the area if it is tender or puffy. Use the ice packs between the warm water baths.  Put ice in a plastic bag.  Place a towel between your skin and the bag.  Leave the ice on for 15-20 minutes, 03-04 times a day.  Do not use a donut-shaped pillow or sit on the toilet for a long time. GET HELP RIGHT AWAY IF:   You have more pain that is not controlled by treatment or medicine.  You have bleeding that will not stop.  You have trouble or are unable to poop (bowel movement).  You have pain or puffiness outside the area of the hemorrhoids. MAKE SURE YOU:   Understand these instructions.  Will watch your condition.  Will get help right away if you are not doing well or get worse. Document Released: 11/14/2007 Document Revised: 01/22/2012 Document Reviewed: 12/17/2011 [facility] Patient Information 2015 [place], [place]. This information is not intended to replace advice given to you by your health care provider. Make sure you discuss any questions you have with your health care provider.  Acute Urinary Retention Urinary retention means you are unable to pee completely or at all (empty your bladder). HOME CARE  Drink enough fluids to keep your pee (urine) clear or pale yellow.  If you are sent home with a tube that drains the bladder (catheter), there will be a drainage bag attached to it. There are two types of bags. One is big that you can wear at night without having to empty it. One is smaller and needs to be emptied more often.  Keep the drainage bag emptied.  Keep the drainage bag lower than the tube.  Only take medicine as told by your doctor. GET  HELP IF:  You have a low-grade fever.  You have spasms or you are leaking pee when you have spasms. GET HELP RIGHT AWAY IF:   You have chills or a fever.  Your catheter stops draining pee.  Your catheter falls out.  You have increased bleeding that does not stop after you have rested and increased the amount of fluids you had been drinking. MAKE SURE YOU:   Understand these instructions.  Will watch your condition.  Will get help right away if you are not doing well or get worse. Document Released: 07/24/2007 Document Revised: 02/09/2013 Document Reviewed: 07/16/2012 [facility] Patient Information 2015 [place], [place]. This information is not intended to replace advice given to you by your health care provider. Make sure you discuss any questions you have with your health care provider.

## 2014-05-09 NOTE — Progress Notes (Signed)
UR completed 

## 2014-05-09 NOTE — Discharge Summary (Signed)
Physician Discharge Summary  Jamie Pitts RXV:[id number] DOB: 08-Aug-1933 DOA: 05/08/2014  PCP: Ria Bush, MD  Admit date: 05/08/2014 Discharge date: 05/09/2014  Time spent: less than 30 minutes  Recommendations for Outpatient Follow-up:  1. Dr. Ria Bush, PCP in 1 week. 2. Dr. Rayburn Ma, Urology in [place] in 5 days.  Discharge Diagnoses:  Principal Problem:   Constipation Active Problems:   Hypothyroidism   Depression, major, recurrent, severe with psychosis   GERD   Acute retention of urine   Urinary retention   Discharge Condition: Improved & Stable  Diet recommendation: Heart healthy diet.  Filed Weights   05/08/14 2227  Weight: 71.1 kg (156 lb 12 oz)    History of present illness & Hospital course:  81 year old female patient with history of hypothyroid, severe recurrent depression with psychosis & chronic anxiety, GERD, osteoporosis, vitamin D deficiency presented to [facility] ED with inability to void urine. Difficulty voiding started the evening of admission. This was associated with some lower abdominal pain. She had similar presentation in the past, seen in ER and referred to urologist and apparently her workup was unremarkable. She has appointment to see a surgeon for hemorrhoids on April 5. She has history of chronic constipation and takes intermittent stool softeners. She was noted to have fecal impaction on abdominal x-ray. She had urinary retention for which a Foley catheter was placed. She was hospitalized for further evaluation and management. Manual disimpaction was done and patient was placed on bowel regimen. She had 4 large BMs on 3/20 with resolution of abdominal discomfort. KUB shows significant improvement and decreased stool. May consider outpatient GI consultation/evaluation as deemed necessary. Apparently 1300 mL of urine drained after Foley catheter placement in ED. Foley catheter was discontinued today and patient voided 250 mL of urine. Her  fecal impaction may have contributed to her acute urinary retention. Benztropine can also contribute to acute urinary retention. She is placed on a bowel regimen and will be discharged home. She and family have been advised to follow-up with her primary urologist in [place] in the next couple of days. For depression and anxiety, she was continued on her prior home medications and remained stable. She was continued on levothyroxin for hypothyroidism and her TSH was normal. Patient probably does not have parkinsonism and is on benztropine to address extrapyramidal side effects of Risperdal.   Consultations:  None  Procedures:  None    Discharge Exam:  Complaints:  States that she feels much better. Multiple large BMs yesterday. Denies abdominal pain, nausea or vomiting. Tolerating diet. Flatus + +. Voided 250 mL of urine post Foley catheter removal.  Filed Vitals:   05/08/14 0530 05/08/14 0616 05/08/14 2227 05/09/14 0605  BP: 145/68 154/78 132/83 132/79  Pulse: 73 88 83 84  Temp:  98 F (36.7 C) 97.8 F (36.6 C) 97.8 F (36.6 C)  TempSrc:  Axillary Oral   Resp:  16 15 16   Weight:   71.1 kg (156 lb 12 oz)   SpO2: 97% 96% 95% 92%    General exam: Pleasant elderly female lying comfortably supine in bed. Respiratory system: Clear. No increased work of breathing. Cardiovascular system: S1 & S2 heard, RRR. No JVD, murmurs, gallops, clicks or pedal edema. Gastrointestinal system: Abdomen is nondistended, soft and nontender. Normal bowel sounds heard. Central nervous system: Alert and oriented. No focal neurological deficits. Extremities: Symmetric 5 x 5 power.  Discharge Instructions      Discharge Instructions    Call MD for:  persistant nausea  and vomiting    Complete by:  As directed      Call MD for:  severe uncontrolled pain    Complete by:  As directed      Call MD for:    Complete by:  As directed   Difficulty urinating.     Diet - low sodium heart healthy     Complete by:  As directed      Increase activity slowly    Complete by:  As directed             Medication List    TAKE these medications        albuterol 108 (90 BASE) MCG/ACT inhaler  Commonly known as:  PROVENTIL HFA;VENTOLIN HFA  Inhale 2 puffs into the lungs every 6 (six) hours as needed (cough).     benztropine 0.5 MG tablet  Commonly known as:  COGENTIN  Take 1 tablet (0.5 mg total) by mouth 3 (three) times daily as needed for tremors.     bisacodyl 10 MG suppository  Commonly known as:  DULCOLAX  Place 1 suppository (10 mg total) rectally daily as needed for moderate constipation.     CALCIUM-D PO  Take 1 tablet by mouth daily.     clonazePAM 0.5 MG tablet  Commonly known as:  KLONOPIN  Take 1 tablet (0.5 mg total) by mouth at bedtime.     dicyclomine 10 MG capsule  Commonly known as:  BENTYL  Take 10 mg by mouth daily as needed for spasms. For diverticulitis     fluticasone 50 MCG/ACT nasal spray  Commonly known as:  FLONASE  Place 2 sprays into both nostrils daily as needed for allergies or rhinitis.     hydrocortisone 25 MG suppository  Commonly known as:  ANUSOL-HC  Place 1 suppository (25 mg total) rectally 2 (two) times daily as needed for hemorrhoids or itching.     lamoTRIgine 25 MG tablet  Commonly known as:  LAMICTAL  Take 2 tablets (50 mg total) by mouth 2 (two) times daily.     levothyroxine 88 MCG tablet  Commonly known as:  SYNTHROID, LEVOTHROID  TAKE ONE TABLET EVERY MORNING ( NEEDS OFFICE VISIT AFTER ONE REFILL)     montelukast 10 MG tablet  Commonly known as:  SINGULAIR  Take 1 tablet (10 mg total) by mouth at bedtime.     polyethylene glycol packet  Commonly known as:  MIRALAX  Take 17 g by mouth 2 (two) times daily.     risperiDONE 0.25 MG tablet  Commonly known as:  RISPERDAL  Take 2 tablets (0.5 mg total) by mouth daily as needed (drop in mood).     risperiDONE 1 MG tablet  Commonly known as:  RISPERDAL  Take 1 tablet (1  mg total) by mouth at bedtime.     senna 8.6 MG Tabs tablet  Commonly known as:  SENOKOT  Take 2 tablets (17.2 mg total) by mouth daily. May decrease to 1 tablet daily or stop if having diarrhea.     venlafaxine 37.5 MG tablet  Commonly known as:  EFFEXOR  Take 1 tablet (37.5 mg total) by mouth 2 (two) times daily.     Vitamin D (Ergocalciferol) 50000 UNITS Caps capsule  Commonly known as:  DRISDOL  Take 1 capsule (50,000 Units total) by mouth every 7 (seven) days.       Follow-up Information    Follow up with Adin Hector., MD.   Specialty:  General Surgery  Why:  as scheduled   Contact information:   [address] [place] [address] [phone]       Follow up with Ria Bush, MD. Schedule an appointment as soon as possible for a visit in 1 week.   Specialty:  Family Medicine   Why:  Hospital follow up.   Contact information:   [place] [address] [phone]       Follow up with Dr. Erlene Quan ( Urology, in [place]). Schedule an appointment as soon as possible for a visit in 5 days.       The results of significant diagnostics from this hospitalization (including imaging, microbiology, ancillary and laboratory) are listed below for reference.    Significant Diagnostic Studies: Dg Abd 1 View  05/08/2014   CLINICAL DATA:  Urinary retention, fecal impaction  EXAM: ABDOMEN - 1 VIEW  COMPARISON:  None.  FINDINGS: There is a large rectal stool mass measuring approximately 12 cm in cross-section, consistent with fecal impaction. There is a generous volume of air throughout nondistended small bowel. There is no free intraperitoneal air. No biliary or urinary calculi are evident.  IMPRESSION: Fecal impaction   Electronically Signed   By: Andreas Newport M.D.   On: 05/08/2014 03:12   Dg Abd Portable 1v  05/09/2014   CLINICAL DATA:  Fecal impaction  EXAM: PORTABLE ABDOMEN - 1 VIEW  COMPARISON:  05/08/2014  FINDINGS: Scattered  large and small bowel gas is noted. No obstructive changes are seen. Fecal material remains within the colon although the amount of fecal impaction in the rectum has decreased significantly. The bony structures are stable.  IMPRESSION: Improved appearance when compare with the prior exam.   Electronically Signed   By: Inez Catalina M.D.   On: 05/09/2014 08:16    Microbiology: Recent Results (from the past 240 hour(s))  Urine culture     Status: None   Collection Time: 05/08/14  1:40 AM  Result Value Ref Range Status   Specimen Description URINE, CATHETERIZED  Final   Special Requests NONE  Final   Colony Count NO GROWTH Performed at [facility]   Final   Culture NO GROWTH Performed at [facility]   Final   Report Status 05/09/2014 FINAL  Final     Labs: Basic Metabolic Panel:  Recent Labs Lab 05/08/14 0109  NA 134*  K 3.8  CL 102  CO2 28  GLUCOSE 86  BUN 9  CREATININE 0.89  CALCIUM 8.8   Liver Function Tests: No results for input(s): AST, ALT, ALKPHOS, BILITOT, PROT, ALBUMIN in the last 168 hours. No results for input(s): LIPASE, AMYLASE in the last 168 hours. No results for input(s): AMMONIA in the last 168 hours. CBC:  Recent Labs Lab 05/08/14 0109  WBC 8.7  NEUTROABS 5.6  HGB 12.2  HCT 37.6  MCV 89.3  PLT 219   Cardiac Enzymes: No results for input(s): CKTOTAL, CKMB, CKMBINDEX, TROPONINI in the last 168 hours. BNP: BNP (last 3 results) No results for input(s): BNP in the last 8760 hours.  ProBNP (last 3 results) No results for input(s): PROBNP in the last 8760 hours.  CBG: No results for input(s): GLUCAP in the last 168 hours.   Additional labs: 1. TSH: 0.481.  Discussed extensively with patient's spouse and daughter at bedside.  Signed:  Vernell Leep, MD, FACP, FHM. [facility] Pager [phone]  If 7PM-7AM, please contact night-coverage [email or web address] Password [facility] 05/09/2014, 3:31 PM

## 2014-05-10 ENCOUNTER — Telehealth: Payer: Self-pay | Admitting: Family Medicine

## 2014-05-10 NOTE — Telephone Encounter (Signed)
Pt admitted with constipation and urinary retention. New bowel regimen of biscodyl suppositories prn and senna daily. plz call today or tomorrow for hosp f/u phone call and offer f/u visit in next 2 weeks.

## 2014-05-10 NOTE — Telephone Encounter (Signed)
Spoke with patient. Constipation and urinary retention have both resolved. Hospital follow up scheduled.

## 2014-05-17 ENCOUNTER — Encounter: Payer: Self-pay | Admitting: Family Medicine

## 2014-05-17 ENCOUNTER — Ambulatory Visit (INDEPENDENT_AMBULATORY_CARE_PROVIDER_SITE_OTHER): Payer: Medicare Other | Admitting: Family Medicine

## 2014-05-17 VITALS — BP 116/68 | HR 87 | Temp 98.1°F | Wt 161.8 lb

## 2014-05-17 DIAGNOSIS — K5909 Other constipation: Secondary | ICD-10-CM

## 2014-05-17 DIAGNOSIS — E039 Hypothyroidism, unspecified: Secondary | ICD-10-CM

## 2014-05-17 DIAGNOSIS — R339 Retention of urine, unspecified: Secondary | ICD-10-CM | POA: Diagnosis not present

## 2014-05-17 NOTE — Patient Instructions (Addendum)
For bowel regimen - take miralax 1/2-1 capful daily every day mixed in 8oz fluid, also take senna 1 tablet daily. If constipation developing - may use suppositories. Continue drinking plenty of water. I think the estrace cream would be ok to use if it is helpful.

## 2014-05-17 NOTE — Progress Notes (Signed)
BP 116/68 mmHg  Pulse 87  Temp(Src) 98.1 F (36.7 C) (Oral)  Wt 161 lb 12.8 oz (73.392 kg)  SpO2 94%  LMP 06/18/1977   CC: hospital f/u visit  Subjective:    Patient ID: Jamie Pitts, female    DOB: February 03, 1934, 81 y.o.   MRN: [id number]  HPI: Stephanny Tsutsui is a 81 y.o. female presenting on 05/17/2014 for Hospitalization Follow-up   Jelissa was admitted last week to [facility] with constipation manifesting as urinary retention. Treated with manual fecal disimpaction in ER then short hospitalization. Discharged on bowel regimen of senna 1-2 tab daily, miralax 17gm bid prn, and bisacodyl supp PRN constipation. Voiding well. Last BM 2d ago.   H/o hemorrhoids - has upcoming appt with surgery. Did not f/u with Dr Erlene Quan.   Admit date: 05/08/2014 Discharge date: 05/09/2014 F/u phone call: 05/10/2014  Recommendations for Outpatient Follow-up:  1. Dr. Ria Bush, PCP in 1 week. 2. Dr. Rayburn Ma, Urology in [place] in 5 days.  Discharge Diagnoses:  Principal Problem:  Constipation Active Problems:  Hypothyroidism  Depression, major, recurrent, severe with psychosis  GERD  Acute retention of urine  Urinary retention Discharge Condition: Improved & Stable Diet recommendation: Heart healthy diet.  Relevant past medical, surgical, family and social history reviewed and updated as indicated. Interim medical history since our last visit reviewed. Allergies and medications reviewed and updated. Current Outpatient Prescriptions on File Prior to Visit  Medication Sig  . albuterol (PROVENTIL HFA;VENTOLIN HFA) 108 (90 BASE) MCG/ACT inhaler Inhale 2 puffs into the lungs every 6 (six) hours as needed (cough).  . benztropine (COGENTIN) 0.5 MG tablet Take 1 tablet (0.5 mg total) by mouth 3 (three) times daily as needed for tremors.  . bisacodyl (DULCOLAX) 10 MG suppository Place 1 suppository (10 mg total) rectally daily as needed for moderate constipation.  . Calcium  Carbonate-Vitamin D (CALCIUM-D PO) Take 1 tablet by mouth daily.  . clonazePAM (KLONOPIN) 0.5 MG tablet Take 1 tablet (0.5 mg total) by mouth at bedtime.  . dicyclomine (BENTYL) 10 MG capsule Take 10 mg by mouth daily as needed for spasms. For diverticulitis  . fluticasone (FLONASE) 50 MCG/ACT nasal spray Place 2 sprays into both nostrils daily as needed for allergies or rhinitis.  . hydrocortisone (ANUSOL-HC) 25 MG suppository Place 1 suppository (25 mg total) rectally 2 (two) times daily as needed for hemorrhoids or itching.  . lamoTRIgine (LAMICTAL) 25 MG tablet Take 2 tablets (50 mg total) by mouth 2 (two) times daily.  Marland Kitchen levothyroxine (SYNTHROID, LEVOTHROID) 88 MCG tablet TAKE ONE TABLET EVERY MORNING ( NEEDS OFFICE VISIT AFTER ONE REFILL)  . montelukast (SINGULAIR) 10 MG tablet Take 1 tablet (10 mg total) by mouth at bedtime.  . polyethylene glycol (MIRALAX) packet Take 17 g by mouth 2 (two) times daily.  . risperiDONE (RISPERDAL) 0.25 MG tablet Take 2 tablets (0.5 mg total) by mouth daily as needed (drop in mood).  . risperiDONE (RISPERDAL) 1 MG tablet Take 1 tablet (1 mg total) by mouth at bedtime.  . senna (SENOKOT) 8.6 MG TABS tablet Take 2 tablets (17.2 mg total) by mouth daily. May decrease to 1 tablet daily or stop if having diarrhea.  . venlafaxine (EFFEXOR) 37.5 MG tablet Take 1 tablet (37.5 mg total) by mouth 2 (two) times daily.  . Vitamin D, Ergocalciferol, (DRISDOL) 50000 UNITS CAPS capsule Take 1 capsule (50,000 Units total) by mouth every 7 (seven) days.   No current facility-administered medications on file prior to visit.  Review of Systems Per HPI unless specifically indicated above     Objective:    BP 116/68 mmHg  Pulse 87  Temp(Src) 98.1 F (36.7 C) (Oral)  Wt 161 lb 12.8 oz (73.392 kg)  SpO2 94%  LMP 06/18/1977  Wt Readings from Last 3 Encounters:  05/17/14 161 lb 12.8 oz (73.392 kg)  05/08/14 156 lb 12 oz (71.1 kg)  04/08/14 156 lb 12.8 oz (71.124 kg)      Physical Exam  Constitutional: She appears well-developed and well-nourished. No distress.  HENT:  Mouth/Throat: Oropharynx is clear and moist. No oropharyngeal exudate.  Cardiovascular: Normal rate, regular rhythm, normal heart sounds and intact distal pulses.   No murmur heard. Pulmonary/Chest: Effort normal and breath sounds normal. No respiratory distress. She has no wheezes. She has no rales.  Abdominal: Soft. Bowel sounds are normal. She exhibits no distension and no mass. There is no tenderness. There is no rebound and no guarding.  Musculoskeletal: She exhibits no edema.  Skin: Skin is warm and dry. No rash noted.  Psychiatric: She has a normal mood and affect.  Nursing note and vitals reviewed.  Results for orders placed or performed during the hospital encounter of 05/08/14  Urine culture  Result Value Ref Range   Specimen Description URINE, CATHETERIZED    Special Requests NONE    Colony Count NO GROWTH Performed at [facility]     Culture NO GROWTH Performed at [facility]     Report Status 05/09/2014 FINAL   Urinalysis, Routine w reflex microscopic  Result Value Ref Range   Color, Urine YELLOW YELLOW   APPearance CLEAR CLEAR   Specific Gravity, Urine 1.009 1.005 - 1.030   pH 6.0 5.0 - 8.0   Glucose, UA NEGATIVE NEGATIVE mg/dL   Hgb urine dipstick NEGATIVE NEGATIVE   Bilirubin Urine NEGATIVE NEGATIVE   Ketones, ur NEGATIVE NEGATIVE mg/dL   Protein, ur NEGATIVE NEGATIVE mg/dL   Urobilinogen, UA 0.2 0.0 - 1.0 mg/dL   Nitrite NEGATIVE NEGATIVE   Leukocytes, UA NEGATIVE NEGATIVE  Basic metabolic panel  Result Value Ref Range   Sodium 134 (L) 135 - 145 mmol/L   Potassium 3.8 3.5 - 5.1 mmol/L   Chloride 102 96 - 112 mmol/L   CO2 28 19 - 32 mmol/L   Glucose, Bld 86 70 - 99 mg/dL   BUN 9 6 - 23 mg/dL   Creatinine, Ser 0.89 0.50 - 1.10 mg/dL   Calcium 8.8 8.4 - 10.5 mg/dL   GFR calc non Af Amer 60 (L) >90 mL/min   GFR calc Af Amer 70 (L) >90  mL/min   Anion gap 4 (L) 5 - 15  CBC with Differential  Result Value Ref Range   WBC 8.7 4.0 - 10.5 K/uL   RBC 4.21 3.87 - 5.11 MIL/uL   Hemoglobin 12.2 12.0 - 15.0 g/dL   HCT 37.6 36.0 - 46.0 %   MCV 89.3 78.0 - 100.0 fL   MCH 29.0 26.0 - 34.0 pg   MCHC 32.4 30.0 - 36.0 g/dL   RDW 13.4 11.5 - 15.5 %   Platelets 219 150 - 400 K/uL   Neutrophils Relative % 65 43 - 77 %   Neutro Abs 5.6 1.7 - 7.7 K/uL   Lymphocytes Relative 24 12 - 46 %   Lymphs Abs 2.1 0.7 - 4.0 K/uL   Monocytes Relative 9 3 - 12 %   Monocytes Absolute 0.8 0.1 - 1.0 K/uL   Eosinophils Relative  2 0 - 5 %   Eosinophils Absolute 0.2 0.0 - 0.7 K/uL   Basophils Relative 0 0 - 1 %   Basophils Absolute 0.0 0.0 - 0.1 K/uL  TSH  Result Value Ref Range   TSH 0.481 0.350 - 4.500 uIU/mL      Assessment & Plan:   Problem List Items Addressed This Visit    Urinary retention    Discussed importance of control of constipation to prevent further retention.      Hypothyroidism    Lab Results  Component Value Date   TSH 0.481 05/08/2014  continue levothyroxine 15mcg daily.      Constipation - Primary    Reviewed new bowel regimen - miralax 1/2-1 capful daily in 8oz water, senna 1-2 tab daily, and dulcolax supp prn. Discussed importance of regular bowel movements. If constipation recurring, pt to contact me right away.          Follow up plan: Return as needed.

## 2014-05-17 NOTE — Assessment & Plan Note (Signed)
Discussed importance of control of constipation to prevent further retention.

## 2014-05-17 NOTE — Assessment & Plan Note (Addendum)
Reviewed new bowel regimen - miralax 1/2-1 capful daily in 8oz water, senna 1-2 tab daily, and dulcolax supp prn. Discussed importance of regular bowel movements. If constipation recurring, pt to contact me right away.

## 2014-05-17 NOTE — Progress Notes (Signed)
Pre visit review using our clinic review tool, if applicable. No additional management support is needed unless otherwise documented below in the visit note. 

## 2014-05-17 NOTE — Assessment & Plan Note (Signed)
Lab Results  Component Value Date   TSH 0.481 05/08/2014  continue levothyroxine 20mcg daily.

## 2014-05-24 DIAGNOSIS — R338 Other retention of urine: Secondary | ICD-10-CM | POA: Diagnosis not present

## 2014-05-24 DIAGNOSIS — K5909 Other constipation: Secondary | ICD-10-CM | POA: Diagnosis not present

## 2014-05-24 DIAGNOSIS — K648 Other hemorrhoids: Secondary | ICD-10-CM | POA: Diagnosis not present

## 2014-06-15 ENCOUNTER — Other Ambulatory Visit: Payer: Self-pay | Admitting: Endocrinology

## 2014-06-29 ENCOUNTER — Ambulatory Visit: Payer: Self-pay | Admitting: Endocrinology

## 2014-06-29 ENCOUNTER — Encounter: Payer: Self-pay | Admitting: *Deleted

## 2014-06-29 DIAGNOSIS — Z0289 Encounter for other administrative examinations: Secondary | ICD-10-CM

## 2014-07-04 ENCOUNTER — Other Ambulatory Visit: Payer: Self-pay | Admitting: Endocrinology

## 2014-07-07 ENCOUNTER — Encounter (HOSPITAL_COMMUNITY): Payer: Self-pay | Admitting: Psychiatry

## 2014-07-07 ENCOUNTER — Ambulatory Visit (INDEPENDENT_AMBULATORY_CARE_PROVIDER_SITE_OTHER): Payer: Medicare Other | Admitting: Psychiatry

## 2014-07-07 VITALS — BP 108/59 | HR 54 | Ht 65.0 in | Wt 166.8 lb

## 2014-07-07 DIAGNOSIS — F29 Unspecified psychosis not due to a substance or known physiological condition: Secondary | ICD-10-CM

## 2014-07-07 DIAGNOSIS — F341 Dysthymic disorder: Secondary | ICD-10-CM | POA: Diagnosis not present

## 2014-07-07 MED ORDER — RISPERIDONE 1 MG PO TABS: 1.0000 mg | ORAL_TABLET | Freq: Every day | ORAL | Status: DC

## 2014-07-07 MED ORDER — BENZTROPINE MESYLATE 0.5 MG PO TABS: 0.5000 mg | ORAL_TABLET | Freq: Three times a day (TID) | ORAL | Status: DC | PRN

## 2014-07-07 MED ORDER — VENLAFAXINE HCL 37.5 MG PO TABS: 37.5000 mg | ORAL_TABLET | Freq: Two times a day (BID) | ORAL | Status: DC

## 2014-07-07 MED ORDER — LAMOTRIGINE 25 MG PO TABS: 50.0000 mg | ORAL_TABLET | Freq: Two times a day (BID) | ORAL | Status: DC

## 2014-07-07 MED ORDER — CLONAZEPAM 0.5 MG PO TABS: 0.5000 mg | ORAL_TABLET | Freq: Every day | ORAL | Status: DC

## 2014-07-07 NOTE — Psych (Signed)
[facility] MD/PA/NP OP Progress Note  07/07/2014 3:34 PM Jamie Pitts  MRN:  [id number]  Subjective:  This patient is a 81 year old married white female who lives with her husband in [place]. She has one daughter and 3 grandchildren. She is retired from the [employer].  Apparently the patient had some sort of psychotic episode in 2013. She was admitted to [facility] because she was hearing voices at home. The voices got so bad that she cannot put a gun under the bed and wanted to shoot him. The patient is a poor historian and is hard of hearing and gets easily confused. She was placed on various medicines in the hospital which have been continued. It's unclear if she is medication compliant. For example she's not taking the Cogentin. She repeats herself a lot.  The patient states she still hearing voices every day. They're not frightening her like they were before but there seemed to her and she can't always get them to stop. She sleeping pretty well she denies being depressed but she is obviously not thinking clearly. She mentions that she's been diagnosed with parkinsonism but she doesn't have a tremor.  The patient returns after 3 months. She is doing better. She was in the hospital for a while for severe constipation and urinary retention but she is on stool softeners now. Her mood is been good and she's no longer having auditory or visual hallucinations. She is sleeping well with the clonazepam. She has no complaints about her medicines Chief Complaint:  Chief Complaint    Depression     Visit Diagnosis:     ICD-9-CM ICD-10-CM   1. ANXIETY DEPRESSION 300.4 F34.1 lamoTRIgine (LAMICTAL) 25 MG tablet  2. Psychosis, unspecified psychosis type 298.9 F29     Past Medical History:  Past Medical History  Diagnosis Date  . GERD (gastroesophageal reflux disease)     intermittent, treated with tums  . Hypothyroidism   . Scoliosis   . Hiatal hernia   . Adenomatous polyp     x1  .  Lumbago   . Severe recurrent depression with psychosis   . Diverticulitis   . Personal history of kidney stones     x1  . ALLERGIC RHINITIS 12/20/2006  . Osteoporosis 2007    declined prolia  . Vitamin D deficiency   . Chronic anxiety   . Insomnia   . Postmenopausal atrophic vaginitis 2016    rec estrace cream - [place]  . Recurrent UTI     Past Surgical History  Procedure Laterality Date  . Appendectomy  1954  . Cholecystectomy    . Abdominal hysterectomy  1979    ectopic pregnancy with one ovary removed  . Tonsillectomy    . Oophorectomy Right 1965    Ectopic pregnancy, removal of right ovary and tube- 1960  . Esophagogastroduodenoscopy    . Rotator cuff repair Right   . Knee surgery Left 2012    L medial meniscal tear   Family History:  Family History  Problem Relation Age of Onset  . Schizophrenia Sister   . Alcohol abuse Neg Hx   . Bipolar disorder Neg Hx   . Dementia Neg Hx   . Drug abuse Neg Hx   . OCD Neg Hx   . Paranoid behavior Neg Hx   . Seizures Neg Hx   . Sexual abuse Neg Hx   . Physical abuse Neg Hx   . Anxiety disorder Grandchild   . Depression Grandchild   . Cancer  Sister     lung  . Healthy Sister   . Diabetes Mother   . Diabetes Father   . Diabetes Sister   . Diabetes Brother   . Cancer Sister     kidney   Social History:  History   Social History  . Marital Status: Married    Spouse Name: N/A  . Number of Children: 1  . Years of Education: N/A   Occupational History  . retired Market researcher    Social History Main Topics  . Smoking status: Never Smoker   . Smokeless tobacco: Never Used  . Alcohol Use: No  . Drug Use: No  . Sexual Activity: Not on file   Other Topics Concern  . None   Social History Narrative   Lives with husband and grandson [name])   Occ: retired, Sales executive   Edu: Architect History:  Assessment: The patient is doing well with continue on her current  medications  Musculoskeletal: Strength & Muscle Tone: within normal limits Gait & Station: normal Patient leans: N/A  Psychiatric Specialty Exam: HPI  ROS  Blood pressure 108/59, pulse 54, height 5\' 5"  (1.651 m), weight 75.66 kg (166 lb 12.8 oz), last menstrual period 06/18/1977.Body mass index is 27.76 kg/(m^2).  General Appearance: Casual, Neat and Well Groomed  Eye Contact:  Good  Speech:  Clear and Coherent  Volume:  Normal  Mood:  Euthymic  Affect:  Appropriate  Thought Process:  Coherent  Orientation:  Full (Time, Place, and Person)  Thought Content:  WDL  Suicidal Thoughts:  No  Homicidal Thoughts:  No  Memory:  Immediate;   Fair Recent;   Fair Remote;   Fair  Judgement:  Fair  Insight:  Fair  Psychomotor Activity:  Normal  Concentration:  Good  Recall:  [place] of Knowledge: Good  Language: Good  Akathisia:  No  Handed:  Right  AIMS (if indicated):    Assets:  Communication Skills Desire for Improvement Resilience Social Support  ADL's:  Intact  Cognition: WNL  Sleep:  good   Is the patient at risk to self?  No. Has the patient been a risk to self in the past 6 months?  No. Has the patient been a risk to self within the distant past?  No. Is the patient a risk to others?  No. Has the patient been a risk to others in the past 6 months?  No. Has the patient been a risk to others within the distant past?  No.  Current Medications: Current Outpatient Prescriptions  Medication Sig Dispense Refill  . albuterol (PROVENTIL HFA;VENTOLIN HFA) 108 (90 BASE) MCG/ACT inhaler Inhale 2 puffs into the lungs every 6 (six) hours as needed (cough). 1 Inhaler 3  . benztropine (COGENTIN) 0.5 MG tablet Take 1 tablet (0.5 mg total) by mouth 3 (three) times daily as needed for tremors. 180 tablet 2  . bisacodyl (DULCOLAX) 10 MG suppository Place 1 suppository (10 mg total) rectally daily as needed for moderate constipation. 12 suppository 0  . Calcium Carbonate-Vitamin D  (CALCIUM-D PO) Take 1 tablet by mouth daily.    . clonazePAM (KLONOPIN) 0.5 MG tablet Take 1 tablet (0.5 mg total) by mouth at bedtime. 90 tablet 2  . dicyclomine (BENTYL) 10 MG capsule Take 10 mg by mouth daily as needed for spasms. For diverticulitis    . estradiol (CLIMARA - DOSED IN MG/24 HR) 0.025 mg/24hr patch APPLY 1 PATCH TO  CLEAN, DRY SKIN ONCE A WEEK 12 patch 3  . fluticasone (FLONASE) 50 MCG/ACT nasal spray Place 2 sprays into both nostrils daily as needed for allergies or rhinitis.    Marland Kitchen lamoTRIgine (LAMICTAL) 25 MG tablet Take 2 tablets (50 mg total) by mouth 2 (two) times daily. 360 tablet 2  . levothyroxine (SYNTHROID, LEVOTHROID) 88 MCG tablet TAKE 1 TABLET EVERY MORNING (NEEDS OFFICE VISIT AFTER ONE REFILL) 90 tablet 1  . montelukast (SINGULAIR) 10 MG tablet Take 1 tablet (10 mg total) by mouth at bedtime. 90 tablet 3  . polyethylene glycol (MIRALAX) packet Take 17 g by mouth 2 (two) times daily. (Patient taking differently: Take 17 g by mouth as needed. ) 30 each 0  . risperiDONE (RISPERDAL) 0.25 MG tablet Take 2 tablets (0.5 mg total) by mouth daily as needed (drop in mood).    . risperiDONE (RISPERDAL) 1 MG tablet Take 1 tablet (1 mg total) by mouth at bedtime. 90 tablet 2  . venlafaxine (EFFEXOR) 37.5 MG tablet Take 1 tablet (37.5 mg total) by mouth 2 (two) times daily. 180 tablet 2  . Vitamin D, Ergocalciferol, (DRISDOL) 50000 UNITS CAPS capsule Take 1 capsule (50,000 Units total) by mouth every 7 (seven) days. 12 capsule 3  . hydrocortisone (ANUSOL-HC) 25 MG suppository Place 1 suppository (25 mg total) rectally 2 (two) times daily as needed for hemorrhoids or itching. (Patient not taking: Reported on 07/07/2014) 12 suppository 0  . senna (SENOKOT) 8.6 MG TABS tablet Take 2 tablets (17.2 mg total) by mouth daily. May decrease to 1 tablet daily or stop if having diarrhea. (Patient not taking: Reported on 07/07/2014) 60 each 0   No current facility-administered medications for this  visit.    Medical Decision Making:  Established Problem, Stable/Improving (1)  Treatment Plan Summary: The patient will continue Effexor for depression, Risperdal for psychotic symptoms Cogentin for symptoms emerging from Risperdal, Lamictal for mood swings and clonazepam for anxiety and sleep. She will return to see me in 3 months or call sooner if symptoms emerge   Harrington Challenger [facility] 07/07/2014, 3:34 PM

## 2014-07-28 ENCOUNTER — Encounter: Payer: Self-pay | Admitting: Endocrinology

## 2014-07-28 ENCOUNTER — Ambulatory Visit (INDEPENDENT_AMBULATORY_CARE_PROVIDER_SITE_OTHER): Payer: Medicare Other | Admitting: Endocrinology

## 2014-07-28 VITALS — BP 116/78 | HR 83 | Temp 97.8°F | Resp 14 | Ht 65.0 in | Wt 171.2 lb

## 2014-07-28 DIAGNOSIS — E038 Other specified hypothyroidism: Secondary | ICD-10-CM | POA: Diagnosis not present

## 2014-07-28 DIAGNOSIS — M8580 Other specified disorders of bone density and structure, unspecified site: Secondary | ICD-10-CM | POA: Diagnosis not present

## 2014-07-28 DIAGNOSIS — E039 Hypothyroidism, unspecified: Secondary | ICD-10-CM

## 2014-07-28 LAB — TSH: TSH: 0.65 u[IU]/mL (ref 0.35–4.50)

## 2014-07-28 LAB — T4, FREE: Free T4: 1.02 ng/dL (ref 0.60–1.60)

## 2014-07-28 NOTE — Patient Instructions (Signed)
Vitamin D every 15 days not weekly

## 2014-07-28 NOTE — Progress Notes (Signed)
Patient ID: Shakerria Parran, female   DOB: 1933-07-22, 81 y.o.   MRN: [id number]    Reason for Appointment:  followup visit    History of Present Illness:   HYPOTHYROIDISM: This was first diagnosed in 1980 This was diagnosed when she had symptoms of fatigue and hair loss No history of goiter  She has been on generic Synthroid and has been on 88 mcg for quite sometime.   She was changed to the generic preparation by her [place] even though she was on brand-name previously  Since her TSH was low normal in 09/2013 she was told to take 6-1/2 tablets a week but she is taking 1 tablet everyday for some time          Compliance with the medical regimen has been as prescribed with taking the tablet in the morning before breakfast. Recently she has no unusual fatigue. She does have chronic cold sensitivity          Labs from today are pending  Lab Results  Component Value Date   FREET4 1.26 11/10/2013   FREET4 1.29 10/14/2013   TSH 0.481 05/08/2014   TSH 0.49 11/10/2013   TSH 0.30* 10/14/2013    OSTEOPOROSIS:  She has had asymptomatic osteoporosis, diagnosed probably in 1997  At that time she was treated with bisphosphonates, probably Fosamax for about 7 years  Was taking Fosamax in 2006 and Actonel in 2007, not clear how long she continued Actonel  She also has had vitamin D deficiency and has been recommended vitamin D by prescription since at least 2013 Initially she was irregular with this because of reported side effects of constipation but more recently she is taking this regularly Because of her level being in the 50s she was told to switch to every other week but she is still doing it every week  She had a T score of -2.8 in 2007 She  had another bone density in 7/15 and the lowest T score is -1.9 at the spine, normal at the hip Currently does not have any height loss or back pain       Medication List       This list is accurate as of: 07/28/14  1:38 PM.   Always use your most recent med list.               albuterol 108 (90 BASE) MCG/ACT inhaler  Commonly known as:  PROVENTIL HFA;VENTOLIN HFA  Inhale 2 puffs into the lungs every 6 (six) hours as needed (cough).     benztropine 0.5 MG tablet  Commonly known as:  COGENTIN  Take 1 tablet (0.5 mg total) by mouth 3 (three) times daily as needed for tremors.     bisacodyl 10 MG suppository  Commonly known as:  DULCOLAX  Place 1 suppository (10 mg total) rectally daily as needed for moderate constipation.     CALCIUM-D PO  Take 1 tablet by mouth daily.     clonazePAM 0.5 MG tablet  Commonly known as:  KLONOPIN  Take 1 tablet (0.5 mg total) by mouth at bedtime.     dicyclomine 10 MG capsule  Commonly known as:  BENTYL  Take 10 mg by mouth daily as needed for spasms. For diverticulitis     estradiol 0.025 mg/24hr patch  Commonly known as:  CLIMARA - Dosed in mg/24 hr  APPLY 1 PATCH TO CLEAN, DRY SKIN ONCE A WEEK     fluticasone 50 MCG/ACT nasal spray  Commonly known  as:  FLONASE  Place 2 sprays into both nostrils daily as needed for allergies or rhinitis.     hydrocortisone 25 MG suppository  Commonly known as:  ANUSOL-HC  Place 1 suppository (25 mg total) rectally 2 (two) times daily as needed for hemorrhoids or itching.     lamoTRIgine 25 MG tablet  Commonly known as:  LAMICTAL  Take 2 tablets (50 mg total) by mouth 2 (two) times daily.     levothyroxine 88 MCG tablet  Commonly known as:  SYNTHROID, LEVOTHROID  TAKE 1 TABLET EVERY MORNING (NEEDS OFFICE VISIT AFTER ONE REFILL)     montelukast 10 MG tablet  Commonly known as:  SINGULAIR  Take 1 tablet (10 mg total) by mouth at bedtime.     polyethylene glycol packet  Commonly known as:  MIRALAX  Take 17 g by mouth 2 (two) times daily.     risperiDONE 0.25 MG tablet  Commonly known as:  RISPERDAL  Take 2 tablets (0.5 mg total) by mouth daily as needed (drop in mood).     risperiDONE 1 MG tablet  Commonly known as:   RISPERDAL  Take 1 tablet (1 mg total) by mouth at bedtime.     senna 8.6 MG Tabs tablet  Commonly known as:  SENOKOT  Take 2 tablets (17.2 mg total) by mouth daily. May decrease to 1 tablet daily or stop if having diarrhea.     venlafaxine 37.5 MG tablet  Commonly known as:  EFFEXOR  Take 1 tablet (37.5 mg total) by mouth 2 (two) times daily.     Vitamin D (Ergocalciferol) 50000 UNITS Caps capsule  Commonly known as:  DRISDOL  Take 1 capsule (50,000 Units total) by mouth every 7 (seven) days.        Allergies:  Allergies  Allergen Reactions  . Amoxicillin Hives    Sores in mouth  . Morphine And Related Itching    Can tolerate if necessary  . Sulfonamide Derivatives Hives    Sores in mouth  . Valium [Diazepam] Itching    Can tolerate if necessary    Past Medical History  Diagnosis Date  . GERD (gastroesophageal reflux disease)     intermittent, treated with tums  . Hypothyroidism   . Scoliosis   . Hiatal hernia   . Adenomatous polyp     x1  . Lumbago   . Severe recurrent depression with psychosis   . Diverticulitis   . Personal history of kidney stones     x1  . ALLERGIC RHINITIS 12/20/2006  . Osteoporosis 2007    declined prolia  . Vitamin D deficiency   . Chronic anxiety   . Insomnia   . Postmenopausal atrophic vaginitis 2016    rec estrace cream - [place]  . Recurrent UTI     Past Surgical History  Procedure Laterality Date  . Appendectomy  1954  . Cholecystectomy    . Abdominal hysterectomy  1979    ectopic pregnancy with one ovary removed  . Tonsillectomy    . Oophorectomy Right 1965    Ectopic pregnancy, removal of right ovary and tube- 1960  . Esophagogastroduodenoscopy    . Rotator cuff repair Right   . Knee surgery Left 2012    L medial meniscal tear    Family History  Problem Relation Age of Onset  . Schizophrenia Sister   . Alcohol abuse Neg Hx   . Bipolar disorder Neg Hx   . Dementia Neg Hx   . Drug  abuse Neg Hx   . OCD Neg Hx    . Paranoid behavior Neg Hx   . Seizures Neg Hx   . Sexual abuse Neg Hx   . Physical abuse Neg Hx   . Anxiety disorder Grandchild   . Depression Grandchild   . Cancer Sister     lung  . Healthy Sister   . Diabetes Mother   . Diabetes Father   . Diabetes Sister   . Diabetes Brother   . Cancer Sister     kidney    Social History:  reports that she has never smoked. She has never used smokeless tobacco. She reports that she does not drink alcohol or use illicit drugs.  REVIEW Of SYSTEMS:  She has had chronic depression  She tends to gain weight when she is eating relatively more including this year   Wt Readings from Last 3 Encounters:  07/28/14 171 lb 3.2 oz (77.656 kg)  07/07/14 166 lb 12.8 oz (75.66 kg)  05/17/14 161 lb 12.8 oz (73.392 kg)     Examination:   BP 116/78 mmHg  Pulse 83  Temp(Src) 97.8 F (36.6 C)  Resp 14  Ht 5\' 5"  (1.651 m)  Wt 171 lb 3.2 oz (77.656 kg)  BMI 28.49 kg/m2  SpO2 93%  LMP 06/18/1977   GENERAL APPEARANCE: Alert And looks well.  No puffiness of face Biceps reflexes appear normal She has scoliosis of the day upper lumbar spine.  No prominent vertebral spines            Assessments/Treatment:  Hypothyroidism, primary, long-standing and without goiter Subjectively she is doing well and is compliant with her medication daily She is taking 88 g of her generic preparation  Will need to check her thyroid levels today again  OSTEOPOROSIS:   This has improved as of 2015 and most likely has benefited from several years therapy of Fosamax and Actonel previously Reassured her that since she has not had any worsening of bone density even after leaving off her Fosamax for about 8 years she does not need another round of treatment unless her bone density is getting low.    She Does not need a bone density until next year as screening VITAMIN D deficiency: She has been on high-dose vitamin D supplement for some time and since her level was  mid normal on her labs last year she can reduce the dose to twice a month for maintenance  She can continue calcium daily     Heinrich Fertig 07/28/2014, 1:38 PM

## 2014-07-29 NOTE — Progress Notes (Signed)
Quick Note:  Please let patient know that the thyroid result is normal and no further action needed ______

## 2014-09-06 DIAGNOSIS — M545 Low back pain: Secondary | ICD-10-CM | POA: Diagnosis not present

## 2014-09-06 DIAGNOSIS — M25551 Pain in right hip: Secondary | ICD-10-CM | POA: Diagnosis not present

## 2014-10-07 ENCOUNTER — Ambulatory Visit (INDEPENDENT_AMBULATORY_CARE_PROVIDER_SITE_OTHER): Payer: Medicare Other | Admitting: Psychiatry

## 2014-10-07 ENCOUNTER — Encounter (HOSPITAL_COMMUNITY): Payer: Self-pay | Admitting: Psychiatry

## 2014-10-07 VITALS — BP 147/80 | HR 80 | Ht 65.0 in | Wt 175.4 lb

## 2014-10-07 DIAGNOSIS — F29 Unspecified psychosis not due to a substance or known physiological condition: Secondary | ICD-10-CM

## 2014-10-07 DIAGNOSIS — F323 Major depressive disorder, single episode, severe with psychotic features: Secondary | ICD-10-CM

## 2014-10-07 DIAGNOSIS — F341 Dysthymic disorder: Secondary | ICD-10-CM

## 2014-10-07 MED ORDER — RISPERIDONE 0.25 MG PO TABS: 0.5000 mg | ORAL_TABLET | Freq: Two times a day (BID) | ORAL | Status: DC

## 2014-10-07 MED ORDER — VENLAFAXINE HCL 37.5 MG PO TABS: 37.5000 mg | ORAL_TABLET | Freq: Two times a day (BID) | ORAL | Status: DC

## 2014-10-07 MED ORDER — CLONAZEPAM 1 MG PO TABS: 1.0000 mg | ORAL_TABLET | Freq: Every day | ORAL | Status: DC

## 2014-10-07 MED ORDER — BENZTROPINE MESYLATE 0.5 MG PO TABS: 0.5000 mg | ORAL_TABLET | Freq: Three times a day (TID) | ORAL | Status: DC | PRN

## 2014-10-07 MED ORDER — RISPERIDONE 1 MG PO TABS: 1.0000 mg | ORAL_TABLET | Freq: Every day | ORAL | Status: DC

## 2014-10-07 NOTE — Progress Notes (Signed)
Patient ID: Jamie Pitts, female   DOB: [birth year], 81 y.o.   MRN: [id number] Patient ID: Jamie Pitts, female   DOB: 25-Jul-1933, 81 y.o.   MRN: [id number] Patient ID: Jamie Pitts, female   DOB: 1933/02/21, 81 y.o.   MRN: [id number] Patient ID: Jamie Pitts, female   DOB: 07/07/1933, 81 y.o.   MRN: [id number] Patient ID: Jamie Pitts, female   DOB: November 09, 1933, 81 y.o.   MRN: [id number] Patient ID: Jamie Pitts, female   DOB: 1933-11-21, 81 y.o.   MRN: [id number] Patient ID: Jamie Pitts, female   DOB: 1933-12-01, 81 y.o.   MRN: [id number] Patient ID: Jamie Pitts, female   DOB: 12-09-1933, 81 y.o.   MRN: [id number] [facility] [id number] Progress Note Jamie Pitts MRN: [id number] DOB: 05/24/1933 Age: 81 y.o.  Date: 10/07/2014 Start Time: 10:20 AM End Time: 10:50 AM  Chief Complaint: Chief Complaint  Patient presents with  . Depression  . Hallucinations  . Anxiety  . Follow-up   Subjective: "I'm not sleeping well "   This patient is a 81 year old married white female who lives with her husband in [place]. She has one daughter and 3 grandchildren. She is retired from the [employer].  Apparently the patient had some sort of psychotic episode in 2013. She was admitted to [facility] because she was hearing voices at home. The voices got so bad that she cannot put a gun under the bed and wanted to shoot him. The patient is a poor historian and is hard of hearing and gets easily confused. She was placed on various medicines in the hospital which have been continued. It's unclear if she is medication compliant. For example she's not taking the Cogentin. She repeats herself a lot.  The patient states she still hearing voices every day. They're not frightening her like they were before but there seemed to her and she can't always get them to stop. She sleeping pretty well she denies being depressed but she is obviously not thinking clearly. She mentions that  she's been diagnosed with parkinsonism but she doesn't have a tremor.  The patient returns after 3 months. She has been doing okay but recently she hasn't been sleeping more than 5 hours a night. She is not sure why. She still hears voices at times but tries to drown them out with the TV. She states she has learned to ignore them. She is not using the respite all during the day and I told her to start using this again when she hears the voices. Her mood is generally been good and she watches her great grandson every day    History of Chief Complaint:   At age 57 pt went to a "fat farm" to fatten her up.  She remembers her father paid more attention to her than her mother did.  She experienced some anxiety when she [place].  She was in [employer] for 2 years, then she was the Glass blower/designer for [employer] and then got into [employer].  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  She noted feeling very home sick to go back to [place] her home place in 2010.  Her sister had been very nervous and she was beginning to feel anxious and was started on Klonopin.  ISince then she noted songs for 3 or 4 years.  Then in October of 2013, she started hearing voices non stop and she lost 30 pounds. This prompted her to get a gun and put  it under the bed and the family got concerned.  She was admitted to [facility] on January 24th.  She was stopped from the Klonopin and Lithium (pt doesn't understand how she got on that) and placed on Ripserdal, Trazodone, Lamictal, and Effexor with pretty good results, except she has very dry mouth at nigh  She has noted blurred vision since starting the meds.  She is getting new glasses for herself.  Depression        Associated symptoms include decreased concentration.  Associated symptoms include no headaches and no suicidal ideas.  Past medical history includes anxiety.   Anxiety Symptoms include confusion, decreased concentration and nervous/anxious behavior.  Patient reports no dizziness or suicidal ideas.     Review of Systems  HENT: Negative.   Gastrointestinal: Negative.   Genitourinary: Negative.   Neurological: Positive for weakness and light-headedness. Negative for dizziness, tremors, seizures, syncope, facial asymmetry, speech difficulty, numbness and headaches.       Lightheaded if gets up too fast.  Psychiatric/Behavioral: Positive for depression, confusion, dysphoric mood and decreased concentration. Negative for suicidal ideas, hallucinations, behavioral problems, sleep disturbance, self-injury and agitation. The patient is nervous/anxious. The patient is not hyperactive.    Physical Exam  Depressive Symptoms: none now  (Hypo) Manic Symptoms:   None  Anxiety Symptoms: Excessive Worry:  Yes Panic Symptoms:  No Agoraphobia:  No Obsessive Compulsive: Yes  Symptoms: ordliness Specific Phobias:  Yes Social Anxiety:  No  Psychotic Symptoms:  None  PTSD Symptoms: Ever had a traumatic exposure:  Yes Had a traumatic exposure in the last month:  No No other features  Traumatic Brain Injury: No   Past Psychiatric History: Diagnosis: Depression  Hospitalizations: once [facility]  Outpatient Care: PCP  Substance Abuse Care: none  Self-Mutilation: none  Suicidal Attempts: none  Violent Behaviors: one episode of getting a gun, but now sees that that is not the way to handle her voices    Allergies: Allergies  Allergen Reactions  . Amoxicillin Hives    Sores in mouth  . Morphine And Related Itching    Can tolerate if necessary  . Sulfonamide Derivatives Hives    Sores in mouth  . Valium [Diazepam] Itching    Can tolerate if necessary   Past Medical History:   Past Medical History  Diagnosis Date  . GERD (gastroesophageal reflux disease)     intermittent, treated with tums  . Hypothyroidism   . Scoliosis   . Hiatal hernia   . Adenomatous polyp     x1  . Lumbago   . Severe recurrent depression with psychosis    . Diverticulitis   . Personal history of kidney stones     x1  . ALLERGIC RHINITIS 12/20/2006  . Osteoporosis 2007    declined prolia  . Vitamin D deficiency   . Chronic anxiety   . Insomnia   . Postmenopausal atrophic vaginitis 2016    rec estrace cream - [place]  . Recurrent UTI    Surgical History: Past Surgical History  Procedure Laterality Date  . Appendectomy  1954  . Cholecystectomy    . Abdominal hysterectomy  1979    ectopic pregnancy with one ovary removed  . Tonsillectomy    . Oophorectomy Right 1965    Ectopic pregnancy, removal of right ovary and tube- 1960  . Esophagogastroduodenoscopy    . Rotator cuff repair Right   . Knee surgery Left 2012    L medial meniscal tear   History  of Loss of Consciousness:  No Seizure History:  No Cardiac History:  No Allergies: Allergies  Allergen Reactions  . Amoxicillin Hives    Sores in mouth  . Morphine And Related Itching    Can tolerate if necessary  . Sulfonamide Derivatives Hives    Sores in mouth  . Valium [Diazepam] Itching    Can tolerate if necessary   Medical History: Past Medical History  Diagnosis Date  . GERD (gastroesophageal reflux disease)     intermittent, treated with tums  . Hypothyroidism   . Scoliosis   . Hiatal hernia   . Adenomatous polyp     x1  . Lumbago   . Severe recurrent depression with psychosis   . Diverticulitis   . Personal history of kidney stones     x1  . ALLERGIC RHINITIS 12/20/2006  . Osteoporosis 2007    declined prolia  . Vitamin D deficiency   . Chronic anxiety   . Insomnia   . Postmenopausal atrophic vaginitis 2016    rec estrace cream - [place]  . Recurrent UTI    Surgical History: Past Surgical History  Procedure Laterality Date  . Appendectomy  1954  . Cholecystectomy    . Abdominal hysterectomy  1979    ectopic pregnancy with one ovary removed  . Tonsillectomy    . Oophorectomy Right 1965    Ectopic pregnancy, removal of right ovary and tube- 1960   . Esophagogastroduodenoscopy    . Rotator cuff repair Right   . Knee surgery Left 2012    L medial meniscal tear   Family History: family history includes Anxiety disorder in her grandchild; Cancer in her sister and sister; Depression in her grandchild; Diabetes in her brother, father, mother, and sister; Healthy in her sister; Schizophrenia in her sister. There is no history of Alcohol abuse, Bipolar disorder, Dementia, Drug abuse, OCD, Paranoid behavior, Seizures, Sexual abuse, or Physical abuse. Reviewed and no changes noted.  Current Medications:  Current Outpatient Prescriptions  Medication Sig Dispense Refill  . risperiDONE (RISPERDAL) 0.25 MG tablet Take 2 tablets (0.5 mg total) by mouth 2 (two) times daily. 60 tablet 2  . albuterol (PROVENTIL HFA;VENTOLIN HFA) 108 (90 BASE) MCG/ACT inhaler Inhale 2 puffs into the lungs every 6 (six) hours as needed (cough). 1 Inhaler 3  . benztropine (COGENTIN) 0.5 MG tablet Take 1 tablet (0.5 mg total) by mouth 3 (three) times daily as needed for tremors. 180 tablet 2  . bisacodyl (DULCOLAX) 10 MG suppository Place 1 suppository (10 mg total) rectally daily as needed for moderate constipation. 12 suppository 0  . Calcium Carbonate-Vitamin D (CALCIUM-D PO) Take 1 tablet by mouth daily.    . clonazePAM (KLONOPIN) 1 MG tablet Take 1 tablet (1 mg total) by mouth at bedtime. 90 tablet 2  . dicyclomine (BENTYL) 10 MG capsule Take 10 mg by mouth daily as needed for spasms. For diverticulitis    . estradiol (CLIMARA - DOSED IN MG/24 HR) 0.025 mg/24hr patch APPLY 1 PATCH TO CLEAN, DRY SKIN ONCE A WEEK (Patient not taking: Reported on 07/28/2014) 12 patch 3  . fluticasone (FLONASE) 50 MCG/ACT nasal spray Place 2 sprays into both nostrils daily as needed for allergies or rhinitis.    . hydrocortisone (ANUSOL-HC) 25 MG suppository Place 1 suppository (25 mg total) rectally 2 (two) times daily as needed for hemorrhoids or itching. (Patient not taking: Reported on  07/07/2014) 12 suppository 0  . lamoTRIgine (LAMICTAL) 25 MG tablet Take 2 tablets (50  mg total) by mouth 2 (two) times daily. 360 tablet 2  . levothyroxine (SYNTHROID, LEVOTHROID) 88 MCG tablet TAKE 1 TABLET EVERY MORNING (NEEDS OFFICE VISIT AFTER ONE REFILL) 90 tablet 1  . montelukast (SINGULAIR) 10 MG tablet Take 1 tablet (10 mg total) by mouth at bedtime. 90 tablet 3  . polyethylene glycol (MIRALAX) packet Take 17 g by mouth 2 (two) times daily. (Patient taking differently: Take 17 g by mouth as needed. ) 30 each 0  . risperiDONE (RISPERDAL) 1 MG tablet Take 1 tablet (1 mg total) by mouth at bedtime. 90 tablet 2  . senna (SENOKOT) 8.6 MG TABS tablet Take 2 tablets (17.2 mg total) by mouth daily. May decrease to 1 tablet daily or stop if having diarrhea. (Patient not taking: Reported on 07/07/2014) 60 each 0  . venlafaxine (EFFEXOR) 37.5 MG tablet Take 1 tablet (37.5 mg total) by mouth 2 (two) times daily. 180 tablet 2  . Vitamin D, Ergocalciferol, (DRISDOL) 50000 UNITS CAPS capsule Take 1 capsule (50,000 Units total) by mouth every 7 (seven) days. 12 capsule 3   No current facility-administered medications for this visit.    Previous Psychotropic Medications:  Medication Dose   Klonopin     Lithium ?    Risperdal    Lamictal    Effexor    Trazodone    Substance Abuse History in the last 12 months: Substance Age of 1st Use Last Use Amount Specific Type  Nicotine  none        Alcohol  20  March 13      Cannabis  none        Opiates  none        Cocaine  none        Methamphetamines  none        LSD  none        Ecstasy  none         Benzodiazepines  [age]      Caffeine  childhood  this AM      Inhalants  none        Others:       sugar  childhood  this AM     Medical Consequences of Substance Abuse: none  Legal Consequences of Substance Abuse: none  Family Consequences of Substance Abuse: none  Social History: Current Place of Residence: [address] [place] [address] Place of Birth: [place], [place] Family Members: husband Marital Status:  Married Children: 1  Sons: 0  Daughters: 1 Relationships: husbandf Education:  Dentist Problems/Performance: good Religious Beliefs/Practices: [employer] History of Abuse: none Occupational Experiences: clerical, office, Architect History:  Press photographer History: none Hobbies/Interests: walking, reading, playing cards  Mental Status Examination/Evaluation: Objective:  Appearance: Casual neatly dressed   Eye Contact::  Good  Speech: Clear and coherent   Volume:  Normal  Mood:  neutral  Affect:  Congruent  Thought Process fairly organized today   Thought content: No current hallucinations  today but has heard voices in the last week     Suicidal Thoughts:  No  Homicidal Thoughts:  No  Judgement:  Good  Insight:  Good  Psychomotor Activity:  Normal  Akathisia:  No  Handed:  Right  AIMS (if indicated):    Assets:  Communication Skills Desire for Improvement   Lab Results:  Results for orders placed or performed in visit on 07/28/14 (from the past 8736 hour(s))  TSH  Collection Time: 07/28/14  1:54 PM  Result Value Ref Range   TSH 0.65 0.35 - 4.50 uIU/mL  T4, free   Collection Time: 07/28/14  1:54 PM  Result Value Ref Range   Free T4 1.02 0.60 - 1.60 ng/dL  Results for orders placed or performed during the hospital encounter of 05/08/14 (from the past 8736 hour(s))  Basic metabolic panel   Collection Time: 05/08/14  1:09 AM  Result Value Ref Range   Sodium 134 (L) 135 - 145 mmol/L   Potassium 3.8 3.5 - 5.1 mmol/L   Chloride 102 96 - 112 mmol/L   CO2 28 19 - 32 mmol/L   Glucose, Bld 86 70 - 99 mg/dL   BUN 9 6 - 23 mg/dL   Creatinine, Ser 0.89 0.50 - 1.10 mg/dL   Calcium 8.8 8.4 - 10.5 mg/dL   GFR calc non Af Amer 60 (L) >90 mL/min   GFR calc Af Amer 70 (L) >90 mL/min   Anion gap 4 (L) 5 - 15  CBC with Differential   Collection Time: 05/08/14   1:09 AM  Result Value Ref Range   WBC 8.7 4.0 - 10.5 K/uL   RBC 4.21 3.87 - 5.11 MIL/uL   Hemoglobin 12.2 12.0 - 15.0 g/dL   HCT 37.6 36.0 - 46.0 %   MCV 89.3 78.0 - 100.0 fL   MCH 29.0 26.0 - 34.0 pg   MCHC 32.4 30.0 - 36.0 g/dL   RDW 13.4 11.5 - 15.5 %   Platelets 219 150 - 400 K/uL   Neutrophils Relative % 65 43 - 77 %   Neutro Abs 5.6 1.7 - 7.7 K/uL   Lymphocytes Relative 24 12 - 46 %   Lymphs Abs 2.1 0.7 - 4.0 K/uL   Monocytes Relative 9 3 - 12 %   Monocytes Absolute 0.8 0.1 - 1.0 K/uL   Eosinophils Relative 2 0 - 5 %   Eosinophils Absolute 0.2 0.0 - 0.7 K/uL   Basophils Relative 0 0 - 1 %   Basophils Absolute 0.0 0.0 - 0.1 K/uL  Urine culture   Collection Time: 05/08/14  1:40 AM  Result Value Ref Range   Specimen Description URINE, CATHETERIZED    Special Requests NONE    Colony Count NO GROWTH Performed at [facility]     Culture NO GROWTH Performed at [facility]     Report Status 05/09/2014 FINAL   Urinalysis, Routine w reflex microscopic   Collection Time: 05/08/14  1:40 AM  Result Value Ref Range   Color, Urine YELLOW YELLOW   APPearance CLEAR CLEAR   Specific Gravity, Urine 1.009 1.005 - 1.030   pH 6.0 5.0 - 8.0   Glucose, UA NEGATIVE NEGATIVE mg/dL   Hgb urine dipstick NEGATIVE NEGATIVE   Bilirubin Urine NEGATIVE NEGATIVE   Ketones, ur NEGATIVE NEGATIVE mg/dL   Protein, ur NEGATIVE NEGATIVE mg/dL   Urobilinogen, UA 0.2 0.0 - 1.0 mg/dL   Nitrite NEGATIVE NEGATIVE   Leukocytes, UA NEGATIVE NEGATIVE  TSH   Collection Time: 05/08/14  2:00 PM  Result Value Ref Range   TSH 0.481 0.350 - 4.500 uIU/mL  Results for orders placed or performed during the hospital encounter of 03/19/14 (from the past 8736 hour(s))  CBC   Collection Time: 03/19/14  2:15 PM  Result Value Ref Range   WBC 8.7 4.0 - 10.5 K/uL   RBC 4.56 3.87 - 5.11 MIL/uL   Hemoglobin 13.2 12.0 - 15.0 g/dL  HCT 39.9 36.0 - 46.0 %   MCV 87.5 78.0 - 100.0 fL   MCH 28.9 26.0 -  34.0 pg   MCHC 33.1 30.0 - 36.0 g/dL   RDW 13.1 11.5 - 15.5 %   Platelets 252 150 - 400 K/uL  Comprehensive metabolic panel   Collection Time: 03/19/14  2:15 PM  Result Value Ref Range   Sodium 138 135 - 145 mmol/L   Potassium 3.9 3.5 - 5.1 mmol/L   Chloride 106 96 - 112 mmol/L   CO2 23 19 - 32 mmol/L   Glucose, Bld 96 70 - 99 mg/dL   BUN 10 6 - 23 mg/dL   Creatinine, Ser 1.03 0.50 - 1.10 mg/dL   Calcium 9.0 8.4 - 10.5 mg/dL   Total Protein 6.6 6.0 - 8.3 g/dL   Albumin 3.4 (L) 3.5 - 5.2 g/dL   AST 15 0 - 37 U/L   ALT 10 0 - 35 U/L   Alkaline Phosphatase 66 39 - 117 U/L   Total Bilirubin 0.9 0.3 - 1.2 mg/dL   GFR calc non Af Amer 51 (L) >90 mL/min   GFR calc Af Amer 59 (L) >90 mL/min   Anion gap 9 5 - 15  Urine culture   Collection Time: 03/19/14  2:23 PM  Result Value Ref Range   Specimen Description URINE, CATHETERIZED    Special Requests NONE    Colony Count NO GROWTH Performed at [facility]     Culture NO GROWTH Performed at [facility]     Report Status 03/21/2014 FINAL   Urinalysis, Routine w reflex microscopic   Collection Time: 03/19/14  2:23 PM  Result Value Ref Range   Color, Urine AMBER (A) YELLOW   APPearance CLEAR CLEAR   Specific Gravity, Urine 1.012 1.005 - 1.030   pH 6.0 5.0 - 8.0   Glucose, UA NEGATIVE NEGATIVE mg/dL   Hgb urine dipstick NEGATIVE NEGATIVE   Bilirubin Urine SMALL (A) NEGATIVE   Ketones, ur 15 (A) NEGATIVE mg/dL   Protein, ur NEGATIVE NEGATIVE mg/dL   Urobilinogen, UA 1.0 0.0 - 1.0 mg/dL   Nitrite POSITIVE (A) NEGATIVE   Leukocytes, UA TRACE (A) NEGATIVE  Urine microscopic-add on   Collection Time: 03/19/14  2:23 PM  Result Value Ref Range   Squamous Epithelial / LPF RARE RARE   WBC, UA 0-2 <3 WBC/hpf   RBC / HPF 0-2 <3 RBC/hpf   Bacteria, UA RARE RARE  Results for orders placed or performed in visit on 11/10/13 (from the past 8736 hour(s))  Basic metabolic panel   Collection Time: 11/10/13 11:24 AM  Result  Value Ref Range   Sodium 134 (L) 135 - 145 mEq/L   Potassium 4.1 3.5 - 5.1 mEq/L   Chloride 101 96 - 112 mEq/L   CO2 26 19 - 32 mEq/L   Glucose, Bld 92 70 - 99 mg/dL   BUN 12 6 - 23 mg/dL   Creatinine, Ser 1.2 0.4 - 1.2 mg/dL   Calcium 9.4 8.4 - 10.5 mg/dL   GFR 47.07 (L) >60.00 mL/min  TSH   Collection Time: 11/10/13 11:24 AM  Result Value Ref Range   TSH 0.49 0.35 - 4.50 uIU/mL  T4, free   Collection Time: 11/10/13 11:24 AM  Result Value Ref Range   Free T4 1.26 0.60 - 1.60 ng/dL  Results for orders placed or performed in visit on 10/14/13 (from the past 8736 hour(s))  TSH   Collection Time: 10/14/13  9:28 AM  Result Value Ref Range   TSH 0.30 (L) 0.35 - 4.50 uIU/mL  T4, free   Collection Time: 10/14/13  9:28 AM  Result Value Ref Range   Free T4 1.29 0.60 - 1.60 ng/dL  Results for orders placed or performed in visit on 10/13/13 (from the past 8736 hour(s))  Vit D  25 hydroxy (rtn osteoporosis monitoring)   Collection Time: 10/13/13  4:17 PM  Result Value Ref Range   VITD 54.60 30.00 - 100.00 ng/mL   Assessment:    AXIS I  major depression with psychotic features, rule out dementia   AXIS II Deferred  AXIS III Past Medical History  Diagnosis Date  . GERD (gastroesophageal reflux disease)     intermittent, treated with tums  . Hypothyroidism   . Scoliosis   . Hiatal hernia   . Adenomatous polyp     x1  . Lumbago   . Severe recurrent depression with psychosis   . Diverticulitis   . Personal history of kidney stones     x1  . ALLERGIC RHINITIS 12/20/2006  . Osteoporosis 2007    declined prolia  . Vitamin D deficiency   . Chronic anxiety   . Insomnia   . Postmenopausal atrophic vaginitis 2016    rec estrace cream - [place]  . Recurrent UTI      AXIS IV economic problems and other psychosocial or environmental problems  AXIS V 61-70 mild symptoms   Treatment Plan/Recommendations:  Laboratory:    Psychotherapy: supportive  Medications: Risperdal,  Lamictal, Effexor  Routine PRN Medications:  No  Consultations: none  Safety Concerns:  none  Other:     Plan/Discussion: I took her vitals.  I reviewed CC, tobacco/med/surg Hx, meds effects/ side effects, problem Pitts, therapies and responses as well as current situation/symptoms discussed options. Continue continue Risperdal and make sure she is taking her daytime dosages for psychosis and benztropine force possible side effects from Risperdal. She will continue Effexor for depression and Lamictal for mood stabilization. She will increase clonazepam to 1 mg at bedtime to help with sleep She will call if hallucinations recur. She'll return in 2 months See orders and pt instructions for more details.  MEDICATIONS this encounter: Meds ordered this encounter  Medications  . risperiDONE (RISPERDAL) 0.25 MG tablet    Sig: Take 2 tablets (0.5 mg total) by mouth 2 (two) times daily.    Dispense:  60 tablet    Refill:  2  . risperiDONE (RISPERDAL) 1 MG tablet    Sig: Take 1 tablet (1 mg total) by mouth at bedtime.    Dispense:  90 tablet    Refill:  2  . venlafaxine (EFFEXOR) 37.5 MG tablet    Sig: Take 1 tablet (37.5 mg total) by mouth 2 (two) times daily.    Dispense:  180 tablet    Refill:  2  . benztropine (COGENTIN) 0.5 MG tablet    Sig: Take 1 tablet (0.5 mg total) by mouth 3 (three) times daily as needed for tremors.    Dispense:  180 tablet    Refill:  2  . clonazePAM (KLONOPIN) 1 MG tablet    Sig: Take 1 tablet (1 mg total) by mouth at bedtime.    Dispense:  90 tablet    Refill:  2    Medical Decision Making Problem Points:  Established problem, stable/improving (1), New problem, with no additional work-up planned (3), Review of last therapy session (1) and Review of psycho-social  stressors (1) Data Points:  Review or order clinical lab tests (1) Review of medication regiment & side effects (2) Review of new medications or change in dosage (2)  four-week's I certify that  outpatient services furnished can reasonably be expected to improve the patient's condition.   Levonne Spiller, MD

## 2014-10-19 ENCOUNTER — Encounter: Payer: Self-pay | Admitting: Urology

## 2014-11-01 ENCOUNTER — Encounter: Payer: Self-pay | Admitting: Urology

## 2014-11-01 ENCOUNTER — Ambulatory Visit: Payer: Self-pay | Admitting: Urology

## 2014-11-11 ENCOUNTER — Ambulatory Visit: Payer: Self-pay | Admitting: Endocrinology

## 2014-11-14 ENCOUNTER — Other Ambulatory Visit: Payer: Self-pay | Admitting: Endocrinology

## 2014-11-16 ENCOUNTER — Telehealth (HOSPITAL_COMMUNITY): Payer: Self-pay

## 2014-11-16 NOTE — Telephone Encounter (Signed)
Cannot speak to anyone without patient consent. She is welcome to come to next visit if pt agrees. If it is a matter of dangerousness to self or others pt needs to be brought to ED

## 2014-11-29 DIAGNOSIS — H6193 Disorder of external ear, unspecified, bilateral: Secondary | ICD-10-CM | POA: Diagnosis not present

## 2014-11-29 DIAGNOSIS — H7291 Unspecified perforation of tympanic membrane, right ear: Secondary | ICD-10-CM | POA: Diagnosis not present

## 2014-12-04 ENCOUNTER — Encounter: Payer: Self-pay | Admitting: Family Medicine

## 2014-12-06 ENCOUNTER — Ambulatory Visit: Payer: Self-pay | Admitting: Family Medicine

## 2014-12-07 ENCOUNTER — Ambulatory Visit (INDEPENDENT_AMBULATORY_CARE_PROVIDER_SITE_OTHER): Payer: Medicare Other | Admitting: Psychiatry

## 2014-12-07 ENCOUNTER — Ambulatory Visit (INDEPENDENT_AMBULATORY_CARE_PROVIDER_SITE_OTHER): Payer: Medicare Other | Admitting: Family Medicine

## 2014-12-07 ENCOUNTER — Encounter: Payer: Self-pay | Admitting: Family Medicine

## 2014-12-07 ENCOUNTER — Encounter (HOSPITAL_COMMUNITY): Payer: Self-pay | Admitting: Psychiatry

## 2014-12-07 VITALS — BP 126/82 | HR 80 | Temp 97.6°F | Wt 178.8 lb

## 2014-12-07 VITALS — BP 113/69 | HR 76 | Ht 65.0 in | Wt 179.0 lb

## 2014-12-07 DIAGNOSIS — F29 Unspecified psychosis not due to a substance or known physiological condition: Secondary | ICD-10-CM

## 2014-12-07 DIAGNOSIS — F323 Major depressive disorder, single episode, severe with psychotic features: Secondary | ICD-10-CM | POA: Diagnosis not present

## 2014-12-07 DIAGNOSIS — J449 Chronic obstructive pulmonary disease, unspecified: Secondary | ICD-10-CM | POA: Diagnosis not present

## 2014-12-07 DIAGNOSIS — J209 Acute bronchitis, unspecified: Secondary | ICD-10-CM | POA: Insufficient documentation

## 2014-12-07 DIAGNOSIS — H7291 Unspecified perforation of tympanic membrane, right ear: Secondary | ICD-10-CM | POA: Diagnosis not present

## 2014-12-07 DIAGNOSIS — F341 Dysthymic disorder: Secondary | ICD-10-CM

## 2014-12-07 MED ORDER — AZITHROMYCIN 250 MG PO TABS: ORAL_TABLET | ORAL | Status: DC

## 2014-12-07 MED ORDER — FLUTICASONE PROPIONATE 50 MCG/ACT NA SUSP: 2.0000 | Freq: Every day | NASAL | Status: DC | PRN

## 2014-12-07 MED ORDER — CLONAZEPAM 1 MG PO TABS: 1.0000 mg | ORAL_TABLET | Freq: Every day | ORAL | Status: DC

## 2014-12-07 MED ORDER — HYDROCODONE-HOMATROPINE 5-1.5 MG/5ML PO SYRP: 5.0000 mL | ORAL_SOLUTION | Freq: Every evening | ORAL | Status: DC | PRN

## 2014-12-07 MED ORDER — ALBUTEROL SULFATE HFA 108 (90 BASE) MCG/ACT IN AERS
2.0000 | INHALATION_SPRAY | Freq: Four times a day (QID) | RESPIRATORY_TRACT | Status: DC | PRN
Start: 2014-12-07 — End: 2015-03-24

## 2014-12-07 MED ORDER — RISPERIDONE 0.25 MG PO TABS: 0.5000 mg | ORAL_TABLET | Freq: Two times a day (BID) | ORAL | Status: DC

## 2014-12-07 MED ORDER — BENZTROPINE MESYLATE 0.5 MG PO TABS: 0.5000 mg | ORAL_TABLET | Freq: Three times a day (TID) | ORAL | Status: DC | PRN

## 2014-12-07 MED ORDER — LAMOTRIGINE 25 MG PO TABS: 50.0000 mg | ORAL_TABLET | Freq: Two times a day (BID) | ORAL | Status: DC

## 2014-12-07 MED ORDER — VENLAFAXINE HCL 37.5 MG PO TABS: 37.5000 mg | ORAL_TABLET | Freq: Two times a day (BID) | ORAL | Status: DC

## 2014-12-07 MED ORDER — RISPERIDONE 1 MG PO TABS: 1.0000 mg | ORAL_TABLET | Freq: Every day | ORAL | Status: DC

## 2014-12-07 NOTE — Assessment & Plan Note (Signed)
By prior CXR - discussed returning for spirometry when feeling better. Pt agrees with plan.

## 2014-12-07 NOTE — Progress Notes (Signed)
Patient ID: Iyanah Demont, female   DOB: [birth year], 81 y.o.   MRN: [id number] Patient ID: Shalia Bartko, female   DOB: 07-15-1933, 81 y.o.   MRN: [id number] Patient ID: Zadaya Cuadra, female   DOB: 11-21-1933, 81 y.o.   MRN: [id number] Patient ID: Adileny Delon, female   DOB: 05-11-1933, 81 y.o.   MRN: [id number] Patient ID: Aarvi Stotts, female   DOB: 1933/04/22, 81 y.o.   MRN: [id number] Patient ID: Piera Downs, female   DOB: [birth year], 81 y.o.   MRN: [id number] Patient ID: Sharni Negron, female   DOB: [birth year], 81 y.o.   MRN: [id number] Patient ID: Ma Munoz, female   DOB: 05-10-1933, 81 y.o.   MRN: [id number] Patient ID: Maiana Hennigan, female   DOB: [birth year], 81 y.o.   MRN: [id number] [facility] [id number] Progress Note Harshita Bernales MRN: [id number] DOB: 03/22/1933 Age: 81 y.o.  Date: 12/07/2014 Start Time: 10:20 AM End Time: 10:50 AM  Chief Complaint: Chief Complaint  Patient presents with  . Hallucinations  . Anxiety  . Follow-up   Subjective: "Sometimes I hear music"   This patient is a 81 year old married white female who lives with her husband in [place]. She has one daughter and 3 grandchildren. She is retired from the [employer].  Apparently the patient had some sort of psychotic episode in 2013. She was admitted to [facility] because she was hearing voices at home. The voices got so bad that she cannot put a gun under the bed and wanted to shoot him. The patient is a poor historian and is hard of hearing and gets easily confused. She was placed on various medicines in the hospital which have been continued. It's unclear if she is medication compliant. For example she's not taking the Cogentin. She repeats herself a lot.  The patient states she still hearing voices every day. They're not frightening her like they were before but there seemed to her and she can't always get them to stop. She sleeping pretty well she denies being  depressed but she is obviously not thinking clearly. She mentions that she's been diagnosed with parkinsonism but she doesn't have a tremor.  The patient returns after 3 months. Recently her granddaughter contacted me and want to talk to me but I could not talk to her without a release and the patient does not want me to talk with her. She states that her family is worried about her because she is been hearing music. At times she hears voices but none of it really bothers her that much. She only uses the 0.25 mg Risperdal once a day if she hears the voices along with the Risperdal that she takes at night. I've told her that she can use up to 4 of those low-dose Risperdal pills through the day especially when the voices and music ramp up. She states that this week she's had more of it and she agrees to take more medicine. However she states her mood is good she's not frightened or paranoid and she is sleeping well.    History of Chief Complaint:   At age 12 pt went to a "fat farm" to fatten her up.  She remembers her father paid more attention to her than her mother did.  She experienced some anxiety when she moved [place].  She was in [employer] for 2 years, then she was the Glass blower/designer for [employer] and then got into [employer].  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  She noted feeling very home sick to go back to [place] her home place in 2010.  Her sister had been very nervous and she was beginning to feel anxious and was started on Klonopin.  ISince then she noted songs for 3 or 4 years.  Then in October of 2013, she started hearing voices non stop and she lost 30 pounds. This prompted her to get a gun and put it under the bed and the family got concerned.  She was admitted to [facility] on January 24th.  She was stopped from the Klonopin and Lithium (pt doesn't understand how she got on that) and placed on Ripserdal, Trazodone, Lamictal, and Effexor with pretty good results, except  she has very dry mouth at nigh  She has noted blurred vision since starting the meds.  She is getting new glasses for herself.  Anxiety Symptoms include confusion, decreased concentration and nervous/anxious behavior. Patient reports no dizziness or suicidal ideas.    Depression        Associated symptoms include decreased concentration.  Associated symptoms include no headaches and no suicidal ideas.  Past medical history includes anxiety.    Review of Systems  HENT: Negative.   Gastrointestinal: Negative.   Genitourinary: Negative.   Neurological: Positive for weakness and light-headedness. Negative for dizziness, tremors, seizures, syncope, facial asymmetry, speech difficulty, numbness and headaches.       Lightheaded if gets up too fast.  Psychiatric/Behavioral: Positive for depression, confusion, dysphoric mood and decreased concentration. Negative for suicidal ideas, hallucinations, behavioral problems, sleep disturbance, self-injury and agitation. The patient is nervous/anxious. The patient is not hyperactive.    Physical Exam  Depressive Symptoms: none now  (Hypo) Manic Symptoms:   None  Anxiety Symptoms: Excessive Worry:  Yes Panic Symptoms:  No Agoraphobia:  No Obsessive Compulsive: Yes  Symptoms: ordliness Specific Phobias:  Yes Social Anxiety:  No  Psychotic Symptoms:  None  PTSD Symptoms: Ever had a traumatic exposure:  Yes Had a traumatic exposure in the last month:  No No other features  Traumatic Brain Injury: No   Past Psychiatric History: Diagnosis: Depression  Hospitalizations: once [facility]  Outpatient Care: PCP  Substance Abuse Care: none  Self-Mutilation: none  Suicidal Attempts: none  Violent Behaviors: one episode of getting a gun, but now sees that that is not the way to handle her voices    Allergies: Allergies  Allergen Reactions  . Amoxicillin Hives    Sores in mouth  . Morphine And Related Itching    Can tolerate if necessary  .  Sulfonamide Derivatives Hives    Sores in mouth  . Valium [Diazepam] Itching    Can tolerate if necessary   Past Medical History:   Past Medical History  Diagnosis Date  . GERD (gastroesophageal reflux disease)     intermittent, treated with tums  . Hypothyroidism   . Scoliosis   . Hiatal hernia   . Adenomatous polyp     x1  . Lumbago   . Severe recurrent depression with psychosis ([place])   . Diverticulitis   . Personal history of kidney stones     x1  . ALLERGIC RHINITIS 12/20/2006  . Osteoporosis 2007    declined prolia  . Vitamin D deficiency   . Chronic anxiety   . Insomnia   . Postmenopausal atrophic vaginitis 2016    rec estrace cream - [place]  . Recurrent UTI   . Urinary retention    Surgical History: Past Surgical History  Procedure Laterality Date  . Appendectomy  1954  . Cholecystectomy    . Abdominal hysterectomy  1979    ectopic pregnancy with one ovary removed  . Tonsillectomy    . Oophorectomy Right 1965    Ectopic pregnancy, removal of right ovary and tube- 1960  . Esophagogastroduodenoscopy    . Rotator cuff repair Right   . Knee surgery Left 2012    L medial meniscal tear   History of Loss of Consciousness:  No Seizure History:  No Cardiac History:  No Allergies: Allergies  Allergen Reactions  . Amoxicillin Hives    Sores in mouth  . Morphine And Related Itching    Can tolerate if necessary  . Sulfonamide Derivatives Hives    Sores in mouth  . Valium [Diazepam] Itching    Can tolerate if necessary   Medical History: Past Medical History  Diagnosis Date  . GERD (gastroesophageal reflux disease)     intermittent, treated with tums  . Hypothyroidism   . Scoliosis   . Hiatal hernia   . Adenomatous polyp     x1  . Lumbago   . Severe recurrent depression with psychosis ([place])   . Diverticulitis   . Personal history of kidney stones     x1  . ALLERGIC RHINITIS 12/20/2006  . Osteoporosis 2007    declined prolia  . Vitamin D  deficiency   . Chronic anxiety   . Insomnia   . Postmenopausal atrophic vaginitis 2016    rec estrace cream - [place]  . Recurrent UTI   . Urinary retention    Surgical History: Past Surgical History  Procedure Laterality Date  . Appendectomy  1954  . Cholecystectomy    . Abdominal hysterectomy  1979    ectopic pregnancy with one ovary removed  . Tonsillectomy    . Oophorectomy Right 1965    Ectopic pregnancy, removal of right ovary and tube- 1960  . Esophagogastroduodenoscopy    . Rotator cuff repair Right   . Knee surgery Left 2012    L medial meniscal tear   Family History: family history includes Anxiety disorder in her grandchild; Cancer in her sister and sister; Depression in her grandchild; Diabetes in her brother, father, mother, and sister; Healthy in her sister; Schizophrenia in her sister. There is no history of Alcohol abuse, Bipolar disorder, Dementia, Drug abuse, OCD, Paranoid behavior, Seizures, Sexual abuse, or Physical abuse. Reviewed and no changes noted.  Current Medications:  Current Outpatient Prescriptions  Medication Sig Dispense Refill  . albuterol (PROVENTIL HFA;VENTOLIN HFA) 108 (90 BASE) MCG/ACT inhaler Inhale 2 puffs into the lungs every 6 (six) hours as needed (cough). 1 Inhaler 3  . azithromycin (ZITHROMAX) 250 MG tablet Take two tablets on day one followed by one tablet on days 2-5 6 each 0  . benztropine (COGENTIN) 0.5 MG tablet Take 1 tablet (0.5 mg total) by mouth 3 (three) times daily as needed for tremors. 180 tablet 2  . bisacodyl (DULCOLAX) 10 MG suppository Place 1 suppository (10 mg total) rectally daily as needed for moderate constipation. 12 suppository 0  . Calcium Carbonate-Vitamin D (CALCIUM-D PO) Take 1 tablet by mouth daily.    . clonazePAM (KLONOPIN) 1 MG tablet Take 1 tablet (1 mg total) by mouth at bedtime. 90 tablet 2  . dicyclomine (BENTYL) 10 MG capsule Take 10 mg by mouth daily as needed for spasms. For diverticulitis    .  fluticasone (FLONASE) 50 MCG/ACT nasal spray Place 2 sprays into both  nostrils daily as needed for allergies or rhinitis. 16 g 3  . HYDROcodone-homatropine (HYCODAN) 5-1.5 MG/5ML syrup Take 5 mLs by mouth at bedtime as needed for cough. 140 mL 0  . hydrocortisone (ANUSOL-HC) 25 MG suppository Place 1 suppository (25 mg total) rectally 2 (two) times daily as needed for hemorrhoids or itching. 12 suppository 0  . lamoTRIgine (LAMICTAL) 25 MG tablet Take 2 tablets (50 mg total) by mouth 2 (two) times daily. 360 tablet 2  . levothyroxine (SYNTHROID, LEVOTHROID) 88 MCG tablet TAKE 1 TABLET EVERY MORNING (NEEDS OFFICE VISIT AFTER ONE REFILL) 90 tablet 1  . montelukast (SINGULAIR) 10 MG tablet Take 1 tablet (10 mg total) by mouth at bedtime. 90 tablet 3  . risperiDONE (RISPERDAL) 0.25 MG tablet Take 2 tablets (0.5 mg total) by mouth 2 (two) times daily. 60 tablet 2  . risperiDONE (RISPERDAL) 1 MG tablet Take 1 tablet (1 mg total) by mouth at bedtime. 90 tablet 2  . venlafaxine (EFFEXOR) 37.5 MG tablet Take 1 tablet (37.5 mg total) by mouth 2 (two) times daily. 180 tablet 2  . Vitamin D, Ergocalciferol, (DRISDOL) 50000 UNITS CAPS capsule TAKE 1 CAPSULE EVERY 7 DAYS 12 capsule 2   No current facility-administered medications for this visit.    Previous Psychotropic Medications:  Medication Dose   Klonopin     Lithium ?    Risperdal    Lamictal    Effexor    Trazodone    Substance Abuse History in the last 12 months: Substance Age of 1st Use Last Use Amount Specific Type  Nicotine  none        Alcohol  20  March 13      Cannabis  none        Opiates  none        Cocaine  none        Methamphetamines  none        LSD  none        Ecstasy  none         Benzodiazepines  [age]      Caffeine  childhood  this AM      Inhalants  none        Others:       sugar  childhood  this AM     Medical Consequences of Substance Abuse: none  Legal Consequences of Substance Abuse: none  Family  Consequences of Substance Abuse: none  Social History: Current Place of Residence: [address] [place] [address] Place of Birth: [place], [place] Family Members: husband Marital Status:  Married Children: 1  Sons: 0  Daughters: 1 Relationships: husbandf Education:  Dentist Problems/Performance: good Religious Beliefs/Practices: [employer] History of Abuse: none Occupational Experiences: clerical, office, Architect History:  Press photographer History: none Hobbies/Interests: walking, reading, playing cards  Mental Status Examination/Evaluation: Objective:  Appearance: Casual neatly dressed   Eye Contact::  Good  Speech: Clear and coherent   Volume:  Normal  Mood:  neutral  Affect:  Congruent  Thought Process fairly organized today   Thought content: Has been hearing music and occasional voices recently, nothing frightening     Suicidal Thoughts:  No  Homicidal Thoughts:  No  Judgement:  Good  Insight:  Good  Psychomotor Activity:  Normal  Akathisia:  No  Handed:  Right  AIMS (if indicated):    Assets:  Communication Skills Desire for Improvement   Lab Results:  Results for  orders placed or performed in visit on 07/28/14 (from the past 8736 hour(s))  TSH   Collection Time: 07/28/14  1:54 PM  Result Value Ref Range   TSH 0.65 0.35 - 4.50 uIU/mL  T4, free   Collection Time: 07/28/14  1:54 PM  Result Value Ref Range   Free T4 1.02 0.60 - 1.60 ng/dL  Results for orders placed or performed during the hospital encounter of 05/08/14 (from the past 8736 hour(s))  Basic metabolic panel   Collection Time: 05/08/14  1:09 AM  Result Value Ref Range   Sodium 134 (L) 135 - 145 mmol/L   Potassium 3.8 3.5 - 5.1 mmol/L   Chloride 102 96 - 112 mmol/L   CO2 28 19 - 32 mmol/L   Glucose, Bld 86 70 - 99 mg/dL   BUN 9 6 - 23 mg/dL   Creatinine, Ser 0.89 0.50 - 1.10 mg/dL   Calcium 8.8 8.4 - 10.5 mg/dL   GFR calc non Af Amer 60 (L) >90 mL/min    GFR calc Af Amer 70 (L) >90 mL/min   Anion gap 4 (L) 5 - 15  CBC with Differential   Collection Time: 05/08/14  1:09 AM  Result Value Ref Range   WBC 8.7 4.0 - 10.5 K/uL   RBC 4.21 3.87 - 5.11 MIL/uL   Hemoglobin 12.2 12.0 - 15.0 g/dL   HCT 37.6 36.0 - 46.0 %   MCV 89.3 78.0 - 100.0 fL   MCH 29.0 26.0 - 34.0 pg   MCHC 32.4 30.0 - 36.0 g/dL   RDW 13.4 11.5 - 15.5 %   Platelets 219 150 - 400 K/uL   Neutrophils Relative % 65 43 - 77 %   Neutro Abs 5.6 1.7 - 7.7 K/uL   Lymphocytes Relative 24 12 - 46 %   Lymphs Abs 2.1 0.7 - 4.0 K/uL   Monocytes Relative 9 3 - 12 %   Monocytes Absolute 0.8 0.1 - 1.0 K/uL   Eosinophils Relative 2 0 - 5 %   Eosinophils Absolute 0.2 0.0 - 0.7 K/uL   Basophils Relative 0 0 - 1 %   Basophils Absolute 0.0 0.0 - 0.1 K/uL  Urine culture   Collection Time: 05/08/14  1:40 AM  Result Value Ref Range   Specimen Description URINE, CATHETERIZED    Special Requests NONE    Colony Count NO GROWTH Performed at [facility]     Culture NO GROWTH Performed at [facility]     Report Status 05/09/2014 FINAL   Urinalysis, Routine w reflex microscopic   Collection Time: 05/08/14  1:40 AM  Result Value Ref Range   Color, Urine YELLOW YELLOW   APPearance CLEAR CLEAR   Specific Gravity, Urine 1.009 1.005 - 1.030   pH 6.0 5.0 - 8.0   Glucose, UA NEGATIVE NEGATIVE mg/dL   Hgb urine dipstick NEGATIVE NEGATIVE   Bilirubin Urine NEGATIVE NEGATIVE   Ketones, ur NEGATIVE NEGATIVE mg/dL   Protein, ur NEGATIVE NEGATIVE mg/dL   Urobilinogen, UA 0.2 0.0 - 1.0 mg/dL   Nitrite NEGATIVE NEGATIVE   Leukocytes, UA NEGATIVE NEGATIVE  TSH   Collection Time: 05/08/14  2:00 PM  Result Value Ref Range   TSH 0.481 0.350 - 4.500 uIU/mL  Results for orders placed or performed during the hospital encounter of 03/19/14 (from the past 8736 hour(s))  CBC   Collection Time: 03/19/14  2:15 PM  Result Value Ref Range   WBC 8.7 4.0 - 10.5 K/uL  RBC 4.56 3.87 -  5.11 MIL/uL   Hemoglobin 13.2 12.0 - 15.0 g/dL   HCT 39.9 36.0 - 46.0 %   MCV 87.5 78.0 - 100.0 fL   MCH 28.9 26.0 - 34.0 pg   MCHC 33.1 30.0 - 36.0 g/dL   RDW 13.1 11.5 - 15.5 %   Platelets 252 150 - 400 K/uL  Comprehensive metabolic panel   Collection Time: 03/19/14  2:15 PM  Result Value Ref Range   Sodium 138 135 - 145 mmol/L   Potassium 3.9 3.5 - 5.1 mmol/L   Chloride 106 96 - 112 mmol/L   CO2 23 19 - 32 mmol/L   Glucose, Bld 96 70 - 99 mg/dL   BUN 10 6 - 23 mg/dL   Creatinine, Ser 1.03 0.50 - 1.10 mg/dL   Calcium 9.0 8.4 - 10.5 mg/dL   Total Protein 6.6 6.0 - 8.3 g/dL   Albumin 3.4 (L) 3.5 - 5.2 g/dL   AST 15 0 - 37 U/L   ALT 10 0 - 35 U/L   Alkaline Phosphatase 66 39 - 117 U/L   Total Bilirubin 0.9 0.3 - 1.2 mg/dL   GFR calc non Af Amer 51 (L) >90 mL/min   GFR calc Af Amer 59 (L) >90 mL/min   Anion gap 9 5 - 15  Urine culture   Collection Time: 03/19/14  2:23 PM  Result Value Ref Range   Specimen Description URINE, CATHETERIZED    Special Requests NONE    Colony Count NO GROWTH Performed at [facility]     Culture NO GROWTH Performed at [facility]     Report Status 03/21/2014 FINAL   Urinalysis, Routine w reflex microscopic   Collection Time: 03/19/14  2:23 PM  Result Value Ref Range   Color, Urine AMBER (A) YELLOW   APPearance CLEAR CLEAR   Specific Gravity, Urine 1.012 1.005 - 1.030   pH 6.0 5.0 - 8.0   Glucose, UA NEGATIVE NEGATIVE mg/dL   Hgb urine dipstick NEGATIVE NEGATIVE   Bilirubin Urine SMALL (A) NEGATIVE   Ketones, ur 15 (A) NEGATIVE mg/dL   Protein, ur NEGATIVE NEGATIVE mg/dL   Urobilinogen, UA 1.0 0.0 - 1.0 mg/dL   Nitrite POSITIVE (A) NEGATIVE   Leukocytes, UA TRACE (A) NEGATIVE  Urine microscopic-add on   Collection Time: 03/19/14  2:23 PM  Result Value Ref Range   Squamous Epithelial / LPF RARE RARE   WBC, UA 0-2 <3 WBC/hpf   RBC / HPF 0-2 <3 RBC/hpf   Bacteria, UA RARE RARE   Assessment:    AXIS I  major  depression with psychotic features, rule out dementia   AXIS II Deferred  AXIS III Past Medical History  Diagnosis Date  . GERD (gastroesophageal reflux disease)     intermittent, treated with tums  . Hypothyroidism   . Scoliosis   . Hiatal hernia   . Adenomatous polyp     x1  . Lumbago   . Severe recurrent depression with psychosis ([place])   . Diverticulitis   . Personal history of kidney stones     x1  . ALLERGIC RHINITIS 12/20/2006  . Osteoporosis 2007    declined prolia  . Vitamin D deficiency   . Chronic anxiety   . Insomnia   . Postmenopausal atrophic vaginitis 2016    rec estrace cream - [place]  . Recurrent UTI   . Urinary retention      AXIS IV economic problems and other psychosocial  or environmental problems  AXIS V 61-70 mild symptoms   Treatment Plan/Recommendations:  Laboratory:    Psychotherapy: supportive  Medications: Risperdal, Lamictal, Effexor  Routine PRN Medications:  No  Consultations: none  Safety Concerns:  none  Other:     Plan/Discussion: I took her vitals.  I reviewed CC, tobacco/med/surg Hx, meds effects/ side effects, problem list, therapies and responses as well as current situation/symptoms discussed options. Continue continue Risperdal and make sure she is taking her daytime dosages for psychosis and benztropine force possible side effects from Risperdal. She will continue Effexor for depression and Lamictal for mood stabilization. She will continue clonazepam to 1 mg at bedtime to help with sleep She will call if hallucinations worsen She'll return in 3 months See orders and pt instructions for more details.  MEDICATIONS this encounter: Meds ordered this encounter  Medications  . lamoTRIgine (LAMICTAL) 25 MG tablet    Sig: Take 2 tablets (50 mg total) by mouth 2 (two) times daily.    Dispense:  360 tablet    Refill:  2  . benztropine (COGENTIN) 0.5 MG tablet    Sig: Take 1 tablet (0.5 mg total) by mouth 3 (three) times daily as  needed for tremors.    Dispense:  180 tablet    Refill:  2  . risperiDONE (RISPERDAL) 1 MG tablet    Sig: Take 1 tablet (1 mg total) by mouth at bedtime.    Dispense:  90 tablet    Refill:  2  . venlafaxine (EFFEXOR) 37.5 MG tablet    Sig: Take 1 tablet (37.5 mg total) by mouth 2 (two) times daily.    Dispense:  180 tablet    Refill:  2  . risperiDONE (RISPERDAL) 0.25 MG tablet    Sig: Take 2 tablets (0.5 mg total) by mouth 2 (two) times daily.    Dispense:  60 tablet    Refill:  2  . clonazePAM (KLONOPIN) 1 MG tablet    Sig: Take 1 tablet (1 mg total) by mouth at bedtime.    Dispense:  90 tablet    Refill:  2    Medical Decision Making Problem Points:  Established problem, stable/improving (1), New problem, with no additional work-up planned (3), Review of last therapy session (1) and Review of psycho-social stressors (1) Data Points:  Review or order clinical lab tests (1) Review of medication regiment & side effects (2) Review of new medications or change in dosage (2)  four-week's I certify that outpatient services furnished can reasonably be expected to improve the patient's condition.   Levonne Spiller, MD

## 2014-12-07 NOTE — Assessment & Plan Note (Signed)
Treat with zpack, hycodan cough syrup - pt states intolerance to codeine (nausea). Update if not improving with treatment. Pt agrees with plan.

## 2014-12-07 NOTE — Progress Notes (Signed)
Pre visit review using our clinic review tool, if applicable. No additional management support is needed unless otherwise documented below in the visit note. 

## 2014-12-07 NOTE — Assessment & Plan Note (Signed)
Seeing Dr Redmond Baseman or Janace Hoard.

## 2014-12-07 NOTE — Progress Notes (Signed)
BP 126/82 mmHg  Pulse 80  Temp(Src) 97.6 F (36.4 C) (Oral)  Wt 178 lb 12 oz (81.08 kg)  SpO2 93%  LMP 06/18/1977   CC: cough  Subjective:    Patient ID: Jamie Pitts, female    DOB: 12-Dec-1933, 81 y.o.   MRN: [id number]  HPI: Jamie Pitts is a 81 y.o. female presenting on 12/07/2014 for Sinusitis   1 wk h/o coughing with fits and gagging, hot sweats, fatigue, sinus pressure headaches with congestion. Cough worse than when it started. Some rhinorrhea and PNdrainage. Chills. Some mild dyspnea. Legs feel heavy. Head > chest congestion. Cough productive of yellow mucous.  No fevers, ST, abd pain. No wheezing.  No sick contacts at home. No smokers at home. No h/o asthma.   Using flonase, albuterol and OTC remedies (robitussin) which helps some. Also taking singulair.   Sees ENT for right TM perforation - discussing TM repair - sees Dr Janace Hoard for this.    Relevant past medical, surgical, family and social history reviewed and updated as indicated. Interim medical history since our last visit reviewed. Allergies and medications reviewed and updated. Current Outpatient Prescriptions on File Prior to Visit  Medication Sig  . benztropine (COGENTIN) 0.5 MG tablet Take 1 tablet (0.5 mg total) by mouth 3 (three) times daily as needed for tremors.  . bisacodyl (DULCOLAX) 10 MG suppository Place 1 suppository (10 mg total) rectally daily as needed for moderate constipation.  . Calcium Carbonate-Vitamin D (CALCIUM-D PO) Take 1 tablet by mouth daily.  . clonazePAM (KLONOPIN) 1 MG tablet Take 1 tablet (1 mg total) by mouth at bedtime.  . dicyclomine (BENTYL) 10 MG capsule Take 10 mg by mouth daily as needed for spasms. For diverticulitis  . hydrocortisone (ANUSOL-HC) 25 MG suppository Place 1 suppository (25 mg total) rectally 2 (two) times daily as needed for hemorrhoids or itching.  . lamoTRIgine (LAMICTAL) 25 MG tablet Take 2 tablets (50 mg total) by mouth 2 (two) times daily.  Marland Kitchen  levothyroxine (SYNTHROID, LEVOTHROID) 88 MCG tablet TAKE 1 TABLET EVERY MORNING (NEEDS OFFICE VISIT AFTER ONE REFILL)  . montelukast (SINGULAIR) 10 MG tablet Take 1 tablet (10 mg total) by mouth at bedtime.  . risperiDONE (RISPERDAL) 0.25 MG tablet Take 2 tablets (0.5 mg total) by mouth 2 (two) times daily.  . risperiDONE (RISPERDAL) 1 MG tablet Take 1 tablet (1 mg total) by mouth at bedtime.  Marland Kitchen venlafaxine (EFFEXOR) 37.5 MG tablet Take 1 tablet (37.5 mg total) by mouth 2 (two) times daily.  . Vitamin D, Ergocalciferol, (DRISDOL) 50000 UNITS CAPS capsule TAKE 1 CAPSULE EVERY 7 DAYS  . estradiol (CLIMARA - DOSED IN MG/24 HR) 0.025 mg/24hr patch APPLY 1 PATCH TO CLEAN, DRY SKIN ONCE A WEEK (Patient not taking: Reported on 07/28/2014)   No current facility-administered medications on file prior to visit.    Review of Systems Per HPI unless specifically indicated above     Objective:    BP 126/82 mmHg  Pulse 80  Temp(Src) 97.6 F (36.4 C) (Oral)  Wt 178 lb 12 oz (81.08 kg)  SpO2 93%  LMP 06/18/1977  Wt Readings from Last 3 Encounters:  12/07/14 178 lb 12 oz (81.08 kg)  10/07/14 175 lb 6.4 oz (79.561 kg)  07/28/14 171 lb 3.2 oz (77.656 kg)    Physical Exam  Constitutional: She appears well-developed and well-nourished. No distress.  HENT:  Head: Normocephalic and atraumatic.  Right Ear: Hearing, tympanic membrane, external ear and ear canal normal.  Left Ear: Hearing, tympanic membrane, external ear and ear canal normal.  Nose: No mucosal edema or rhinorrhea. Right sinus exhibits frontal sinus tenderness. Right sinus exhibits no maxillary sinus tenderness. Left sinus exhibits frontal sinus tenderness. Left sinus exhibits no maxillary sinus tenderness.  Mouth/Throat: Uvula is midline, oropharynx is clear and moist and mucous membranes are normal. No oropharyngeal exudate, posterior oropharyngeal edema, posterior oropharyngeal erythema or tonsillar abscesses.  Mucosal edema/pallor  Eyes:  Conjunctivae and EOM are normal. Pupils are equal, round, and reactive to light. No scleral icterus.  Neck: Normal range of motion. Neck supple.  Cardiovascular: Normal rate, regular rhythm, normal heart sounds and intact distal pulses.   No murmur heard. Pulmonary/Chest: Effort normal. No respiratory distress. She has no wheezes. She has rhonchi (left sided, clears with deep cough ). She has no rales.  Lymphadenopathy:    She has no cervical adenopathy.  Skin: Skin is warm and dry. No rash noted.  Nursing note and vitals reviewed.     Assessment & Plan:   Problem List Items Addressed This Visit    Tympanic membrane perforation    Seeing Dr Redmond Baseman or Janace Hoard.      COPD (chronic obstructive pulmonary disease) ([place])    By prior CXR - discussed returning for spirometry when feeling better. Pt agrees with plan.      Relevant Medications   fluticasone (FLONASE) 50 MCG/ACT nasal spray   albuterol (PROVENTIL HFA;VENTOLIN HFA) 108 (90 BASE) MCG/ACT inhaler   azithromycin (ZITHROMAX) 250 MG tablet   HYDROcodone-homatropine (HYCODAN) 5-1.5 MG/5ML syrup   Acute bronchitis - Primary    Treat with zpack, hycodan cough syrup - pt states intolerance to codeine (nausea). Update if not improving with treatment. Pt agrees with plan.          Follow up plan: Return if symptoms worsen or fail to improve.

## 2014-12-07 NOTE — Patient Instructions (Addendum)
I do think you have bronchitis. Treat with zpack and hydrocodone cough syrup for night time cough.  Push fluids and rest. Continue flonase and albuterol as needed. Let us know if not improving over the next week Return for spirometry when feeling well.

## 2014-12-17 DIAGNOSIS — M4156 Other secondary scoliosis, lumbar region: Secondary | ICD-10-CM | POA: Diagnosis not present

## 2014-12-17 DIAGNOSIS — M5136 Other intervertebral disc degeneration, lumbar region: Secondary | ICD-10-CM | POA: Diagnosis not present

## 2014-12-17 DIAGNOSIS — M25551 Pain in right hip: Secondary | ICD-10-CM | POA: Diagnosis not present

## 2014-12-17 DIAGNOSIS — M25552 Pain in left hip: Secondary | ICD-10-CM | POA: Diagnosis not present

## 2014-12-19 DIAGNOSIS — M5489 Other dorsalgia: Secondary | ICD-10-CM | POA: Diagnosis not present

## 2014-12-19 DIAGNOSIS — M545 Low back pain: Secondary | ICD-10-CM | POA: Diagnosis not present

## 2014-12-23 DIAGNOSIS — M25552 Pain in left hip: Secondary | ICD-10-CM | POA: Diagnosis not present

## 2014-12-23 DIAGNOSIS — M5136 Other intervertebral disc degeneration, lumbar region: Secondary | ICD-10-CM | POA: Diagnosis not present

## 2014-12-23 DIAGNOSIS — M25551 Pain in right hip: Secondary | ICD-10-CM | POA: Diagnosis not present

## 2014-12-24 ENCOUNTER — Other Ambulatory Visit: Payer: Self-pay | Admitting: Family Medicine

## 2014-12-29 ENCOUNTER — Other Ambulatory Visit: Payer: Self-pay | Admitting: Endocrinology

## 2015-01-07 DIAGNOSIS — M545 Low back pain: Secondary | ICD-10-CM | POA: Diagnosis not present

## 2015-01-07 DIAGNOSIS — M5489 Other dorsalgia: Secondary | ICD-10-CM | POA: Diagnosis not present

## 2015-01-07 DIAGNOSIS — M5136 Other intervertebral disc degeneration, lumbar region: Secondary | ICD-10-CM | POA: Diagnosis not present

## 2015-01-26 ENCOUNTER — Ambulatory Visit: Payer: Medicare Other | Admitting: Family Medicine

## 2015-01-31 DIAGNOSIS — M545 Low back pain: Secondary | ICD-10-CM | POA: Diagnosis not present

## 2015-02-03 ENCOUNTER — Ambulatory Visit: Payer: Medicare Other | Admitting: Family Medicine

## 2015-02-16 ENCOUNTER — Encounter: Payer: Self-pay | Admitting: *Deleted

## 2015-02-16 DIAGNOSIS — M47816 Spondylosis without myelopathy or radiculopathy, lumbar region: Secondary | ICD-10-CM | POA: Diagnosis not present

## 2015-02-16 DIAGNOSIS — M5136 Other intervertebral disc degeneration, lumbar region: Secondary | ICD-10-CM | POA: Diagnosis not present

## 2015-03-08 ENCOUNTER — Encounter (HOSPITAL_COMMUNITY): Payer: Self-pay | Admitting: Psychiatry

## 2015-03-08 ENCOUNTER — Ambulatory Visit (INDEPENDENT_AMBULATORY_CARE_PROVIDER_SITE_OTHER): Payer: Medicare Other | Admitting: Psychiatry

## 2015-03-08 VITALS — BP 92/63 | HR 96 | Ht 65.0 in | Wt 177.4 lb

## 2015-03-08 DIAGNOSIS — F341 Dysthymic disorder: Secondary | ICD-10-CM

## 2015-03-08 DIAGNOSIS — F333 Major depressive disorder, recurrent, severe with psychotic symptoms: Secondary | ICD-10-CM

## 2015-03-08 MED ORDER — CLONAZEPAM 1 MG PO TABS: 1.0000 mg | ORAL_TABLET | Freq: Every day | ORAL | Status: DC

## 2015-03-08 MED ORDER — RISPERIDONE 1 MG PO TABS: ORAL_TABLET | ORAL | Status: DC

## 2015-03-08 MED ORDER — LAMOTRIGINE 25 MG PO TABS: 50.0000 mg | ORAL_TABLET | Freq: Two times a day (BID) | ORAL | Status: DC

## 2015-03-08 MED ORDER — BENZTROPINE MESYLATE 0.5 MG PO TABS: 0.5000 mg | ORAL_TABLET | Freq: Three times a day (TID) | ORAL | Status: DC | PRN

## 2015-03-08 MED ORDER — VENLAFAXINE HCL 37.5 MG PO TABS: 37.5000 mg | ORAL_TABLET | Freq: Two times a day (BID) | ORAL | Status: DC

## 2015-03-08 NOTE — Progress Notes (Signed)
Patient ID: Jamie Pitts, female   DOB: 1933-07-11, 82 y.o.   MRN: [id number] Patient ID: Jamie Pitts, female   DOB: 06-Sep-1933, 82 y.o.   MRN: [id number] Patient ID: Jamie Pitts, female   DOB: 29-Apr-1933, 82 y.o.   MRN: [id number] Patient ID: Jamie Pitts, female   DOB: [birth year], 82 y.o.   MRN: [id number] Patient ID: Jamie Pitts, female   DOB: [birth year], 82 y.o.   MRN: [id number] Patient ID: Jamie Pitts, female   DOB: 1933/11/14, 82 y.o.   MRN: [id number] Patient ID: Jamie Pitts, female   DOB: August 06, 1933, 82 y.o.   MRN: [id number] Patient ID: Jamie Pitts, female   DOB: 1933-11-12, 82 y.o.   MRN: [id number] Patient ID: Jamie Pitts, female   DOB: 1933/08/03, 82 y.o.   MRN: [id number] Patient ID: Jamie Pitts, female   DOB: [birth year], 82 y.o.   MRN: [id number] [facility] [id number] Progress Note Jamie Pitts MRN: [id number] DOB: 06/20/1933 Age: 82 y.o.  Date: 03/08/2015 Start Time: 10:20 AM End Time: 10:50 AM  Chief Complaint: Chief Complaint  Patient presents with  . Anxiety  . Hallucinations  . Follow-up   Subjective: "I am hearing the voices again"   This patient is a 82 year old married white female who lives with her husband in [place]. She has one daughter and 3 grandchildren. She is retired from the [employer].  Apparently the patient had some sort of psychotic episode in 2013. She was admitted to [facility] because she was hearing voices at home. The voices got so bad that she cannot put a gun under the bed and wanted to shoot him. The patient is a poor historian and is hard of hearing and gets easily confused. She was placed on various medicines in the hospital which have been continued. It's unclear if she is medication compliant. For example she's not taking the Cogentin. She repeats herself a lot.  The patient states she still hearing voices every day. They're not frightening her like they were before but there seemed to her and  she can't always get them to stop. She sleeping pretty well she denies being depressed but she is obviously not thinking clearly. She mentions that she's been diagnosed with parkinsonism but she doesn't have a tremor.  The patient returns after 3 months. She states that the voices are back again. It started with music but now they are whispering to her. She is increased her Risperdal to 1 mg in the morning and 2 mg at bedtime. She states that she is not frightened of the voices and they're not telling her to do anything bad like harming herself. She is oriented and able to answer questions appropriately. She asked if she can increase her respite on the 2 mg twice a day and I think this is reasonable for short time at least. She mentions that she fell on January 3 in her kitchen and this is what started things going in the wrong direction. She did not hit her head   History of Chief Complaint:   At age 54 pt went to a "fat farm" to fatten her up.  She remembers her father paid more attention to her than her mother did.  She experienced some anxiety when she moved [place].  She was in [employer] for 2 years, then she was the Glass blower/designer for [employer] and then got into [employer].  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  She noted feeling very home sick to go back  to [employer] her home place in 2010.  Her sister had been very nervous and she was beginning to feel anxious and was started on Klonopin.  ISince then she noted songs for 3 or 4 years.  Then in October of 2013, she started hearing voices non stop and she lost 30 pounds. This prompted her to get a gun and put it under the bed and the family got concerned.  She was admitted to [facility] on January 24th.  She was stopped from the Klonopin and Lithium (pt doesn't understand how she got on that) and placed on Ripserdal, Trazodone, Lamictal, and Effexor with pretty good results, except she has very dry mouth at nigh  She has noted blurred  vision since starting the meds.  She is getting new glasses for herself.  Anxiety Symptoms include confusion, decreased concentration and nervous/anxious behavior. Patient reports no dizziness or suicidal ideas.    Depression        Associated symptoms include decreased concentration.  Associated symptoms include no headaches and no suicidal ideas.  Past medical history includes anxiety.    Review of Systems  HENT: Negative.   Gastrointestinal: Negative.   Genitourinary: Negative.   Neurological: Positive for weakness and light-headedness. Negative for dizziness, tremors, seizures, syncope, facial asymmetry, speech difficulty, numbness and headaches.       Lightheaded if gets up too fast.  Psychiatric/Behavioral: Positive for depression, confusion, dysphoric mood and decreased concentration. Negative for suicidal ideas, hallucinations, behavioral problems, sleep disturbance, self-injury and agitation. The patient is nervous/anxious. The patient is not hyperactive.    Physical Exam  Depressive Symptoms: none now  (Hypo) Manic Symptoms:   None  Anxiety Symptoms: Excessive Worry:  Yes Panic Symptoms:  No Agoraphobia:  No Obsessive Compulsive: Yes  Symptoms: ordliness Specific Phobias:  Yes Social Anxiety:  No  Psychotic Symptoms:  None  PTSD Symptoms: Ever had a traumatic exposure:  Yes Had a traumatic exposure in the last month:  No No other features  Traumatic Brain Injury: No   Past Psychiatric History: Diagnosis: Depression  Hospitalizations: once [facility]  Outpatient Care: PCP  Substance Abuse Care: none  Self-Mutilation: none  Suicidal Attempts: none  Violent Behaviors: one episode of getting a gun, but now sees that that is not the way to handle her voices    Allergies: Allergies  Allergen Reactions  . Amoxicillin Hives    Sores in mouth  . Morphine And Related Itching    Can tolerate if necessary  . Sulfonamide Derivatives Hives    Sores in mouth  .  Valium [Diazepam] Itching    Can tolerate if necessary   Past Medical History:   Past Medical History  Diagnosis Date  . GERD (gastroesophageal reflux disease)     intermittent, treated with tums  . Hypothyroidism   . Scoliosis   . Hiatal hernia   . Adenomatous polyp     x1  . Lumbago   . Severe recurrent depression with psychosis ([place])   . Diverticulitis   . Personal history of kidney stones     x1  . ALLERGIC RHINITIS 12/20/2006  . Osteoporosis 2007    declined prolia  . Vitamin D deficiency   . Chronic anxiety   . Insomnia   . Postmenopausal atrophic vaginitis 2016    rec estrace cream - [place]  . Recurrent UTI   . Urinary retention    Surgical History: Past Surgical History  Procedure Laterality Date  . Appendectomy  1954  .  Cholecystectomy    . Abdominal hysterectomy  1979    ectopic pregnancy with one ovary removed  . Tonsillectomy    . Oophorectomy Right 1965    Ectopic pregnancy, removal of right ovary and tube- 1960  . Esophagogastroduodenoscopy    . Rotator cuff repair Right   . Knee surgery Left 2012    L medial meniscal tear   History of Loss of Consciousness:  No Seizure History:  No Cardiac History:  No Allergies: Allergies  Allergen Reactions  . Amoxicillin Hives    Sores in mouth  . Morphine And Related Itching    Can tolerate if necessary  . Sulfonamide Derivatives Hives    Sores in mouth  . Valium [Diazepam] Itching    Can tolerate if necessary   Medical History: Past Medical History  Diagnosis Date  . GERD (gastroesophageal reflux disease)     intermittent, treated with tums  . Hypothyroidism   . Scoliosis   . Hiatal hernia   . Adenomatous polyp     x1  . Lumbago   . Severe recurrent depression with psychosis ([place])   . Diverticulitis   . Personal history of kidney stones     x1  . ALLERGIC RHINITIS 12/20/2006  . Osteoporosis 2007    declined prolia  . Vitamin D deficiency   . Chronic anxiety   . Insomnia   .  Postmenopausal atrophic vaginitis 2016    rec estrace cream - [place]  . Recurrent UTI   . Urinary retention    Surgical History: Past Surgical History  Procedure Laterality Date  . Appendectomy  1954  . Cholecystectomy    . Abdominal hysterectomy  1979    ectopic pregnancy with one ovary removed  . Tonsillectomy    . Oophorectomy Right 1965    Ectopic pregnancy, removal of right ovary and tube- 1960  . Esophagogastroduodenoscopy    . Rotator cuff repair Right   . Knee surgery Left 2012    L medial meniscal tear   Family History: family history includes Anxiety disorder in her grandchild; Cancer in her sister and sister; Depression in her grandchild; Diabetes in her brother, father, mother, and sister; Healthy in her sister; Schizophrenia in her sister. There is no history of Alcohol abuse, Bipolar disorder, Dementia, Drug abuse, OCD, Paranoid behavior, Seizures, Sexual abuse, or Physical abuse. Reviewed and no changes noted.  Current Medications:  Current Outpatient Prescriptions  Medication Sig Dispense Refill  . albuterol (PROVENTIL HFA;VENTOLIN HFA) 108 (90 BASE) MCG/ACT inhaler Inhale 2 puffs into the lungs every 6 (six) hours as needed (cough). 1 Inhaler 3  . benztropine (COGENTIN) 0.5 MG tablet Take 1 tablet (0.5 mg total) by mouth 3 (three) times daily as needed for tremors. 180 tablet 2  . bisacodyl (DULCOLAX) 10 MG suppository Place 1 suppository (10 mg total) rectally daily as needed for moderate constipation. 12 suppository 0  . Calcium Carbonate-Vitamin D (CALCIUM-D PO) Take 1 tablet by mouth daily.    . clonazePAM (KLONOPIN) 1 MG tablet Take 1 tablet (1 mg total) by mouth at bedtime. 90 tablet 2  . dicyclomine (BENTYL) 10 MG capsule Take 10 mg by mouth daily as needed for spasms. For diverticulitis    . fluticasone (FLONASE) 50 MCG/ACT nasal spray Place 2 sprays into both nostrils daily as needed for allergies or rhinitis. 16 g 3  . hydrocortisone (ANUSOL-HC) 25 MG  suppository Place 1 suppository (25 mg total) rectally 2 (two) times daily as needed for hemorrhoids  or itching. 12 suppository 0  . lamoTRIgine (LAMICTAL) 25 MG tablet Take 2 tablets (50 mg total) by mouth 2 (two) times daily. 360 tablet 2  . levothyroxine (SYNTHROID, LEVOTHROID) 88 MCG tablet TAKE 1 TABLET EVERY MORNING (NEED OFFICE VISIT AFTER ONE REFILL) 90 tablet 1  . montelukast (SINGULAIR) 10 MG tablet TAKE 1 TABLET AT BEDTIME 90 tablet 2  . risperiDONE (RISPERDAL) 0.25 MG tablet Take 2 tablets (0.5 mg total) by mouth 2 (two) times daily. 60 tablet 2  . risperiDONE (RISPERDAL) 1 MG tablet Take 2 in the am and 2 at bedtime 120 tablet 2  . venlafaxine (EFFEXOR) 37.5 MG tablet Take 1 tablet (37.5 mg total) by mouth 2 (two) times daily. 180 tablet 2  . Vitamin D, Ergocalciferol, (DRISDOL) 50000 UNITS CAPS capsule TAKE 1 CAPSULE EVERY 7 DAYS 12 capsule 2   No current facility-administered medications for this visit.    Previous Psychotropic Medications:  Medication Dose   Klonopin     Lithium ?    Risperdal    Lamictal    Effexor    Trazodone    Substance Abuse History in the last 12 months: Substance Age of 1st Use Last Use Amount Specific Type  Nicotine  none        Alcohol  20  March 13      Cannabis  none        Opiates  none        Cocaine  none        Methamphetamines  none        LSD  none        Ecstasy  none         Benzodiazepines  [age]      Caffeine  childhood  this AM      Inhalants  none        Others:       sugar  childhood  this AM     Medical Consequences of Substance Abuse: none  Legal Consequences of Substance Abuse: none  Family Consequences of Substance Abuse: none  Social History: Current Place of Residence: [address] [place] [address] Place of Birth: [place], [place] Family Members: husband Marital Status:  Married Children: 1  Sons: 0  Daughters: 1 Relationships: husbandf Education:  Dentist  Problems/Performance: good Religious Beliefs/Practices: [employer] History of Abuse: none Occupational Experiences: clerical, office, Architect History:  Press photographer History: none Hobbies/Interests: walking, reading, playing cards  Mental Status Examination/Evaluation: Objective:  Appearance: Casual neatly dressed   Eye Contact::  Good  Speech: Clear and coherent   Volume:  Normal  Mood:  neutral  Affect:  Congruent  Thought Process fairly organized today   Thought content: Has been hearing occasional voices recently, nothing frightening but they are annoying and disturbing her     Suicidal Thoughts:  No  Homicidal Thoughts:  No  Judgement:  Good  Insight:  Good  Psychomotor Activity:  Normal  Akathisia:  No  Handed:  Right  AIMS (if indicated):    Assets:  Communication Skills Desire for Improvement   Lab Results:  Results for orders placed or performed in visit on 07/28/14 (from the past 8736 hour(s))  TSH   Collection Time: 07/28/14  1:54 PM  Result Value Ref Range   TSH 0.65 0.35 - 4.50 uIU/mL  T4, free   Collection Time: 07/28/14  1:54 PM  Result Value Ref Range   Free  T4 1.02 0.60 - 1.60 ng/dL  Results for orders placed or performed during the hospital encounter of 05/08/14 (from the past 8736 hour(s))  Basic metabolic panel   Collection Time: 05/08/14  1:09 AM  Result Value Ref Range   Sodium 134 (L) 135 - 145 mmol/L   Potassium 3.8 3.5 - 5.1 mmol/L   Chloride 102 96 - 112 mmol/L   CO2 28 19 - 32 mmol/L   Glucose, Bld 86 70 - 99 mg/dL   BUN 9 6 - 23 mg/dL   Creatinine, Ser 0.89 0.50 - 1.10 mg/dL   Calcium 8.8 8.4 - 10.5 mg/dL   GFR calc non Af Amer 60 (L) >90 mL/min   GFR calc Af Amer 70 (L) >90 mL/min   Anion gap 4 (L) 5 - 15  CBC with Differential   Collection Time: 05/08/14  1:09 AM  Result Value Ref Range   WBC 8.7 4.0 - 10.5 K/uL   RBC 4.21 3.87 - 5.11 MIL/uL   Hemoglobin 12.2 12.0 - 15.0 g/dL   HCT 37.6 36.0 - 46.0 %   MCV 89.3  78.0 - 100.0 fL   MCH 29.0 26.0 - 34.0 pg   MCHC 32.4 30.0 - 36.0 g/dL   RDW 13.4 11.5 - 15.5 %   Platelets 219 150 - 400 K/uL   Neutrophils Relative % 65 43 - 77 %   Neutro Abs 5.6 1.7 - 7.7 K/uL   Lymphocytes Relative 24 12 - 46 %   Lymphs Abs 2.1 0.7 - 4.0 K/uL   Monocytes Relative 9 3 - 12 %   Monocytes Absolute 0.8 0.1 - 1.0 K/uL   Eosinophils Relative 2 0 - 5 %   Eosinophils Absolute 0.2 0.0 - 0.7 K/uL   Basophils Relative 0 0 - 1 %   Basophils Absolute 0.0 0.0 - 0.1 K/uL  Urine culture   Collection Time: 05/08/14  1:40 AM  Result Value Ref Range   Specimen Description URINE, CATHETERIZED    Special Requests NONE    Colony Count NO GROWTH Performed at [facility]     Culture NO GROWTH Performed at [facility]     Report Status 05/09/2014 FINAL   Urinalysis, Routine w reflex microscopic   Collection Time: 05/08/14  1:40 AM  Result Value Ref Range   Color, Urine YELLOW YELLOW   APPearance CLEAR CLEAR   Specific Gravity, Urine 1.009 1.005 - 1.030   pH 6.0 5.0 - 8.0   Glucose, UA NEGATIVE NEGATIVE mg/dL   Hgb urine dipstick NEGATIVE NEGATIVE   Bilirubin Urine NEGATIVE NEGATIVE   Ketones, ur NEGATIVE NEGATIVE mg/dL   Protein, ur NEGATIVE NEGATIVE mg/dL   Urobilinogen, UA 0.2 0.0 - 1.0 mg/dL   Nitrite NEGATIVE NEGATIVE   Leukocytes, UA NEGATIVE NEGATIVE  TSH   Collection Time: 05/08/14  2:00 PM  Result Value Ref Range   TSH 0.481 0.350 - 4.500 uIU/mL  Results for orders placed or performed during the hospital encounter of 03/19/14 (from the past 8736 hour(s))  CBC   Collection Time: 03/19/14  2:15 PM  Result Value Ref Range   WBC 8.7 4.0 - 10.5 K/uL   RBC 4.56 3.87 - 5.11 MIL/uL   Hemoglobin 13.2 12.0 - 15.0 g/dL   HCT 39.9 36.0 - 46.0 %   MCV 87.5 78.0 - 100.0 fL   MCH 28.9 26.0 - 34.0 pg   MCHC 33.1 30.0 - 36.0 g/dL   RDW 13.1 11.5 - 15.5 %  Platelets 252 150 - 400 K/uL  Comprehensive metabolic panel   Collection Time: 03/19/14  2:15 PM   Result Value Ref Range   Sodium 138 135 - 145 mmol/L   Potassium 3.9 3.5 - 5.1 mmol/L   Chloride 106 96 - 112 mmol/L   CO2 23 19 - 32 mmol/L   Glucose, Bld 96 70 - 99 mg/dL   BUN 10 6 - 23 mg/dL   Creatinine, Ser 1.03 0.50 - 1.10 mg/dL   Calcium 9.0 8.4 - 10.5 mg/dL   Total Protein 6.6 6.0 - 8.3 g/dL   Albumin 3.4 (L) 3.5 - 5.2 g/dL   AST 15 0 - 37 U/L   ALT 10 0 - 35 U/L   Alkaline Phosphatase 66 39 - 117 U/L   Total Bilirubin 0.9 0.3 - 1.2 mg/dL   GFR calc non Af Amer 51 (L) >90 mL/min   GFR calc Af Amer 59 (L) >90 mL/min   Anion gap 9 5 - 15  Urine culture   Collection Time: 03/19/14  2:23 PM  Result Value Ref Range   Specimen Description URINE, CATHETERIZED    Special Requests NONE    Colony Count NO GROWTH Performed at [facility]     Culture NO GROWTH Performed at [facility]     Report Status 03/21/2014 FINAL   Urinalysis, Routine w reflex microscopic   Collection Time: 03/19/14  2:23 PM  Result Value Ref Range   Color, Urine AMBER (A) YELLOW   APPearance CLEAR CLEAR   Specific Gravity, Urine 1.012 1.005 - 1.030   pH 6.0 5.0 - 8.0   Glucose, UA NEGATIVE NEGATIVE mg/dL   Hgb urine dipstick NEGATIVE NEGATIVE   Bilirubin Urine SMALL (A) NEGATIVE   Ketones, ur 15 (A) NEGATIVE mg/dL   Protein, ur NEGATIVE NEGATIVE mg/dL   Urobilinogen, UA 1.0 0.0 - 1.0 mg/dL   Nitrite POSITIVE (A) NEGATIVE   Leukocytes, UA TRACE (A) NEGATIVE  Urine microscopic-add on   Collection Time: 03/19/14  2:23 PM  Result Value Ref Range   Squamous Epithelial / LPF RARE RARE   WBC, UA 0-2 <3 WBC/hpf   RBC / HPF 0-2 <3 RBC/hpf   Bacteria, UA RARE RARE   Assessment:    AXIS I  major depression with psychotic features, rule out dementia   AXIS II Deferred  AXIS III Past Medical History  Diagnosis Date  . GERD (gastroesophageal reflux disease)     intermittent, treated with tums  . Hypothyroidism   . Scoliosis   . Hiatal hernia   . Adenomatous polyp     x1  .  Lumbago   . Severe recurrent depression with psychosis ([place])   . Diverticulitis   . Personal history of kidney stones     x1  . ALLERGIC RHINITIS 12/20/2006  . Osteoporosis 2007    declined prolia  . Vitamin D deficiency   . Chronic anxiety   . Insomnia   . Postmenopausal atrophic vaginitis 2016    rec estrace cream - [place]  . Recurrent UTI   . Urinary retention      AXIS IV economic problems and other psychosocial or environmental problems  AXIS V 61-70 mild symptoms   Treatment Plan/Recommendations:  Laboratory:    Psychotherapy: supportive  Medications: Risperdal, Lamictal, Effexor  Routine PRN Medications:  No  Consultations: none  Safety Concerns:  none  Other:     Plan/Discussion: I took her vitals.  I reviewed CC, tobacco/med/surg Hx,  meds effects/ side effects, problem list, therapies and responses as well as current situation/symptoms discussed options. Continue continue Risperdal but make sure she increases the dose to 2 mg twice  for psychosis and benztropine force possible side effects from Risperdal. She will continue Effexor for depression and Lamictal for mood stabilization. She will continue clonazepam to 1 mg at bedtime to help with sleep She will call if hallucinations worsen She'll return in 4 weeks See orders and pt instructions for more details.  MEDICATIONS this encounter: Meds ordered this encounter  Medications  . lamoTRIgine (LAMICTAL) 25 MG tablet    Sig: Take 2 tablets (50 mg total) by mouth 2 (two) times daily.    Dispense:  360 tablet    Refill:  2  . benztropine (COGENTIN) 0.5 MG tablet    Sig: Take 1 tablet (0.5 mg total) by mouth 3 (three) times daily as needed for tremors.    Dispense:  180 tablet    Refill:  2  . venlafaxine (EFFEXOR) 37.5 MG tablet    Sig: Take 1 tablet (37.5 mg total) by mouth 2 (two) times daily.    Dispense:  180 tablet    Refill:  2  . risperiDONE (RISPERDAL) 1 MG tablet    Sig: Take 2 in the am and 2 at  bedtime    Dispense:  120 tablet    Refill:  2  . clonazePAM (KLONOPIN) 1 MG tablet    Sig: Take 1 tablet (1 mg total) by mouth at bedtime.    Dispense:  90 tablet    Refill:  2    Medical Decision Making Problem Points:  Established problem, stable/improving (1), New problem, with no additional work-up planned (3), Review of last therapy session (1) and Review of psycho-social stressors (1) Data Points:  Review or order clinical lab tests (1) Review of medication regiment & side effects (2) Review of new medications or change in dosage (2)  four-week's I certify that outpatient services furnished can reasonably be expected to improve the patient's condition.   Levonne Spiller, MD

## 2015-03-12 ENCOUNTER — Encounter (HOSPITAL_COMMUNITY): Payer: Self-pay | Admitting: Emergency Medicine

## 2015-03-12 ENCOUNTER — Emergency Department (HOSPITAL_COMMUNITY): Payer: Medicare Other

## 2015-03-12 ENCOUNTER — Emergency Department (HOSPITAL_COMMUNITY)
Admission: EM | Admit: 2015-03-12 | Discharge: 2015-03-12 | Disposition: A | Discharge status: Home / Self Care | Payer: Medicare Other | Attending: Emergency Medicine | Admitting: Emergency Medicine

## 2015-03-12 DIAGNOSIS — Z880 Allergy status to penicillin: Secondary | ICD-10-CM | POA: Insufficient documentation

## 2015-03-12 DIAGNOSIS — M5432 Sciatica, left side: Secondary | ICD-10-CM | POA: Diagnosis not present

## 2015-03-12 DIAGNOSIS — Z87440 Personal history of urinary (tract) infections: Secondary | ICD-10-CM | POA: Diagnosis not present

## 2015-03-12 DIAGNOSIS — F419 Anxiety disorder, unspecified: Secondary | ICD-10-CM | POA: Insufficient documentation

## 2015-03-12 DIAGNOSIS — S79912A Unspecified injury of left hip, initial encounter: Secondary | ICD-10-CM | POA: Diagnosis not present

## 2015-03-12 DIAGNOSIS — E559 Vitamin D deficiency, unspecified: Secondary | ICD-10-CM | POA: Insufficient documentation

## 2015-03-12 DIAGNOSIS — Z87442 Personal history of urinary calculi: Secondary | ICD-10-CM | POA: Diagnosis not present

## 2015-03-12 DIAGNOSIS — G4700 Insomnia, unspecified: Secondary | ICD-10-CM | POA: Insufficient documentation

## 2015-03-12 DIAGNOSIS — M25552 Pain in left hip: Secondary | ICD-10-CM | POA: Diagnosis not present

## 2015-03-12 DIAGNOSIS — E039 Hypothyroidism, unspecified: Secondary | ICD-10-CM | POA: Diagnosis not present

## 2015-03-12 DIAGNOSIS — Z86010 Personal history of colonic polyps: Secondary | ICD-10-CM | POA: Insufficient documentation

## 2015-03-12 DIAGNOSIS — Y998 Other external cause status: Secondary | ICD-10-CM | POA: Diagnosis not present

## 2015-03-12 DIAGNOSIS — S3992XA Unspecified injury of lower back, initial encounter: Secondary | ICD-10-CM | POA: Diagnosis not present

## 2015-03-12 DIAGNOSIS — Y9289 Other specified places as the place of occurrence of the external cause: Secondary | ICD-10-CM | POA: Insufficient documentation

## 2015-03-12 DIAGNOSIS — Z8719 Personal history of other diseases of the digestive system: Secondary | ICD-10-CM | POA: Insufficient documentation

## 2015-03-12 DIAGNOSIS — W1839XA Other fall on same level, initial encounter: Secondary | ICD-10-CM | POA: Insufficient documentation

## 2015-03-12 DIAGNOSIS — Z79899 Other long term (current) drug therapy: Secondary | ICD-10-CM | POA: Insufficient documentation

## 2015-03-12 DIAGNOSIS — M545 Low back pain: Secondary | ICD-10-CM | POA: Diagnosis not present

## 2015-03-12 DIAGNOSIS — Y9389 Activity, other specified: Secondary | ICD-10-CM | POA: Diagnosis not present

## 2015-03-12 DIAGNOSIS — F333 Major depressive disorder, recurrent, severe with psychotic symptoms: Secondary | ICD-10-CM | POA: Diagnosis not present

## 2015-03-12 MED ORDER — ACETAMINOPHEN 500 MG PO TABS: 1000.0000 mg | ORAL_TABLET | Freq: Once | ORAL | Status: DC

## 2015-03-12 NOTE — ED Provider Notes (Signed)
CSN: [id number]     Arrival date & time 03/12/15  [id number] History   First MD Initiated Contact with Patient 03/12/15 1121     Chief Complaint  Patient presents with  . Fall  . Hip Injury     (Consider location/radiation/quality/duration/timing/severity/associated sxs/prior Treatment) Patient is a 82 y.o. female presenting with hip pain. The history is provided by the patient.  Hip Pain This is a new problem. The current episode started more than 1 week ago. The problem occurs constantly. The problem has been gradually worsening. Pertinent negatives include no chest pain, no abdominal pain, no headaches and no shortness of breath. Nothing aggravates the symptoms. Nothing relieves the symptoms. She has tried nothing for the symptoms. The treatment provided no relief.    82 yo F  With a chief complaints of left hip pain. This happened after she fell. This was more than 2 weeks ago. Said she had tangled in some computer cords and fell onto her left side. Was partially assisted to the ground. Pain to the lateral aspect of the left leg. Has a history of chronic back pain that she feels like is not worse. Pain radiates down her left outside of the leg down just above the foot. Has had worsening issues with ambulation as this causes her pain.  Past Medical History  Diagnosis Date  . GERD (gastroesophageal reflux disease)     intermittent, treated with tums  . Hypothyroidism   . Scoliosis   . Hiatal hernia   . Adenomatous polyp     x1  . Lumbago   . Severe recurrent depression with psychosis ([place])   . Diverticulitis   . Personal history of kidney stones     x1  . ALLERGIC RHINITIS 12/20/2006  . Osteoporosis 2007    declined prolia  . Vitamin D deficiency   . Chronic anxiety   . Insomnia   . Postmenopausal atrophic vaginitis 2016    rec estrace cream - [place]  . Recurrent UTI   . Urinary retention    Past Surgical History  Procedure Laterality Date  . Appendectomy  1954  .  Cholecystectomy    . Abdominal hysterectomy  1979    ectopic pregnancy with one ovary removed  . Tonsillectomy    . Oophorectomy Right 1965    Ectopic pregnancy, removal of right ovary and tube- 1960  . Esophagogastroduodenoscopy    . Rotator cuff repair Right   . Knee surgery Left 2012    L medial meniscal tear   Family History  Problem Relation Age of Onset  . Schizophrenia Sister   . Alcohol abuse Neg Hx   . Bipolar disorder Neg Hx   . Dementia Neg Hx   . Drug abuse Neg Hx   . OCD Neg Hx   . Paranoid behavior Neg Hx   . Seizures Neg Hx   . Sexual abuse Neg Hx   . Physical abuse Neg Hx   . Anxiety disorder Grandchild   . Depression Grandchild   . Cancer Sister     lung  . Healthy Sister   . Diabetes Mother   . Diabetes Father   . Diabetes Sister   . Diabetes Brother   . Cancer Sister     kidney   Social History  Substance Use Topics  . Smoking status: Never Smoker   . Smokeless tobacco: Never Used  . Alcohol Use: No   OB History    No data available  Review of Systems  Constitutional: Negative for fever and chills.  HENT: Negative for congestion and rhinorrhea.   Eyes: Negative for redness and visual disturbance.  Respiratory: Negative for shortness of breath and wheezing.   Cardiovascular: Negative for chest pain and palpitations.  Gastrointestinal: Negative for nausea, vomiting and abdominal pain.  Genitourinary: Negative for dysuria and urgency.  Musculoskeletal: Positive for myalgias, arthralgias and gait problem.  Skin: Negative for pallor and wound.  Neurological: Negative for dizziness and headaches.      Allergies  Amoxicillin; Morphine and related; Sulfonamide derivatives; and Valium  Home Medications   Prior to Admission medications   Medication Sig Start Date End Date Taking? Authorizing Provider  albuterol (PROVENTIL HFA;VENTOLIN HFA) 108 (90 BASE) MCG/ACT inhaler Inhale 2 puffs into the lungs every 6 (six) hours as needed (cough).  12/07/14  Yes Ria Bush, MD  benztropine (COGENTIN) 0.5 MG tablet Take 1 tablet (0.5 mg total) by mouth 3 (three) times daily as needed for tremors. 03/08/15  Yes Cloria Spring, MD  bisacodyl (DULCOLAX) 10 MG suppository Place 1 suppository (10 mg total) rectally daily as needed for moderate constipation. 05/09/14  Yes Modena Jansky, MD  Calcium Carbonate-Vitamin D (CALCIUM-D PO) Take 1 tablet by mouth daily.   Yes Historical Provider, MD  clonazePAM (KLONOPIN) 1 MG tablet Take 1 tablet (1 mg total) by mouth at bedtime. 03/08/15 03/07/16 Yes Cloria Spring, MD  cyclobenzaprine (FLEXERIL) 10 MG tablet Take 10 mg by mouth 3 (three) times daily as needed for muscle spasms.   Yes Historical Provider, MD  dicyclomine (BENTYL) 10 MG capsule Take 10 mg by mouth daily as needed for spasms. For diverticulitis   Yes Historical Provider, MD  fluticasone (FLONASE) 50 MCG/ACT nasal spray Place 2 sprays into both nostrils daily as needed for allergies or rhinitis. 12/07/14  Yes Ria Bush, MD  hydrocortisone (ANUSOL-HC) 25 MG suppository Place 1 suppository (25 mg total) rectally 2 (two) times daily as needed for hemorrhoids or itching. 05/09/14  Yes Modena Jansky, MD  lamoTRIgine (LAMICTAL) 25 MG tablet Take 2 tablets (50 mg total) by mouth 2 (two) times daily. 03/08/15  Yes Cloria Spring, MD  levothyroxine (SYNTHROID, LEVOTHROID) 88 MCG tablet TAKE 1 TABLET EVERY MORNING (NEED OFFICE VISIT AFTER ONE REFILL) 12/29/14  Yes Elayne Snare, MD  montelukast (SINGULAIR) 10 MG tablet TAKE 1 TABLET AT BEDTIME 12/25/14  Yes Ria Bush, MD  risperiDONE (RISPERDAL) 0.25 MG tablet Take 2 tablets (0.5 mg total) by mouth 2 (two) times daily. 12/07/14  Yes Cloria Spring, MD  risperiDONE (RISPERDAL) 1 MG tablet Take 2 in the am and 2 at bedtime 03/08/15  Yes Cloria Spring, MD  traMADol (ULTRAM) 50 MG tablet Take 50 mg by mouth every 6 (six) hours as needed for moderate pain.   Yes Historical Provider, MD   venlafaxine (EFFEXOR) 37.5 MG tablet Take 1 tablet (37.5 mg total) by mouth 2 (two) times daily. 03/08/15  Yes Cloria Spring, MD  Vitamin D, Ergocalciferol, (DRISDOL) 50000 UNITS CAPS capsule TAKE 1 CAPSULE EVERY 7 DAYS 11/14/14  Yes Elayne Snare, MD   BP 126/70 mmHg  Pulse 83  Temp(Src) 98.1 F (36.7 C) (Oral)  Resp 16  Ht 5\' 5"  (1.651 m)  Wt 177 lb 6 oz (80.457 kg)  BMI 29.52 kg/m2  SpO2 96%  LMP 06/18/1977 Physical Exam  Constitutional: She is oriented to person, place, and time. She appears well-developed and well-nourished. No distress.  HENT:  Head: Normocephalic and atraumatic.  Eyes: EOM are normal. Pupils are equal, round, and reactive to light.  Neck: Normal range of motion. Neck supple.  Cardiovascular: Normal rate and regular rhythm.  Exam reveals no gallop and no friction rub.   No murmur heard. Pulmonary/Chest: Effort normal. She has no wheezes. She has no rales.  Abdominal: Soft. She exhibits no distension. There is no tenderness. There is no rebound and no guarding.  Musculoskeletal: She exhibits tenderness ( mild tenderness about the left lateral aspect of the hip. No noted tenderness to the L-spine. No pain with internal/external rotation of the left lower externally. pulse motor and sensation intact distally.). She exhibits no edema.  Neurological: She is alert and oriented to person, place, and time.  Skin: Skin is warm and dry. She is not diaphoretic.  Psychiatric: She has a normal mood and affect. Her behavior is normal.  Nursing note and vitals reviewed.   ED Course  Procedures (including critical care time) Labs Review Labs Reviewed - No data to display  Imaging Review Dg Lumbar Spine Complete  03/12/2015  CLINICAL DATA:  Pain status post fall 19 days ago. EXAM: LUMBAR SPINE - COMPLETE 4+ VIEW COMPARISON:  None. FINDINGS: There is no evidence of lumbar spine fracture. There is reverse S shaped lumbosacral scoliosis. Associated mild to moderate  osteoarthritic changes are seen. Extensive posterior facet arthropathy noted at L4-L5 and L5-S1. Mild bilateral osseous neural foramina narrowing at these levels. IMPRESSION: No acute fracture or dislocation identified about the lumbosacral spine. Reverse a shaped lumbosacral scoliosis with moderate multilevel osteoarthritic changes. Posterior facet arthropathy in the lower lumbosacral spine. Associated L4-L5 and L5-S1 bony neural foramina narrowing bilaterally. Electronically Signed   By: Fidela Salisbury M.D.   On: 03/12/2015 13:45   Dg Hip Unilat With Pelvis 2-3 Views Left  03/12/2015  CLINICAL DATA:  Fall 3 weeks ago with persistent left hip pain, initial encounter EXAM: DG HIP (WITH OR WITHOUT PELVIS) 2-3V LEFT COMPARISON:  None. FINDINGS: There is no evidence of hip fracture or dislocation. There is no evidence of arthropathy or other focal bone abnormality. IMPRESSION: No acute abnormality noted. Electronically Signed   By: Inez Catalina M.D.   On: 03/12/2015 10:34   I have personally reviewed and evaluated these images and lab results as part of my medical decision-making.   EKG Interpretation None      MDM   Final diagnoses:  Sciatica of left side    82 yo F  With a chief complaint of left lower extremity pain. Symptoms are consistent with sciatica. X-ray of the hip as well as the L-spine are unremarkable and viewed by me. We'll discharge the patient home. She sees her spinal doctor on Tuesday. Recommended that she discuss these symptoms with him.  3:50 PM:  I have discussed the diagnosis/risks/treatment options with the patient and family and believe the pt to be eligible for discharge home to follow-up with PCP, spine. We also discussed returning to the ED immediately if new or worsening sx occur. We discussed the sx which are most concerning (e.g., sudden worsening pain, fever, inability to tolerate by mouth) that necessitate immediate return. Medications administered to the patient  during their visit and any new prescriptions provided to the patient are listed below.  Medications given during this visit Medications - No data to display  Discharge Medication List as of 03/12/2015  2:10 PM      The patient appears reasonably screen and/or stabilized for discharge and  I doubt any other medical condition or other [facility] requiring further screening, evaluation, or treatment in the ED at this time prior to discharge.      Deno Etienne, DO 03/12/15 1550

## 2015-03-12 NOTE — ED Notes (Signed)
Pt c/o fall on January 3rd causing injury to left hip. Pt has not been evaluated by a Dr when she fell. Pt reports getting harder to walk.

## 2015-03-12 NOTE — Discharge Instructions (Signed)
°  Sciatica °Sciatica is pain, weakness, numbness, or tingling along your sciatic nerve. The nerve starts in the lower back and runs down the back of each leg. Nerve damage or certain conditions pinch or put pressure on the sciatic nerve. This causes the pain, weakness, and other discomforts of sciatica. °HOME CARE  °· Only take medicine as told by your doctor. °· Apply ice to the affected area for 20 minutes. Do this 3-4 times a day for the first 48-72 hours. Then try heat in the same way. °· Exercise, stretch, or do your usual activities if these do not make your pain worse. °· Go to physical therapy as told by your doctor. °· Keep all doctor visits as told. °· Do not wear high heels or shoes that are not supportive. °· Get a firm mattress if your mattress is too soft to lessen pain and discomfort. °GET HELP RIGHT AWAY IF:  °· You cannot control when you poop (bowel movement) or pee (urinate). °· You have more weakness in your lower back, lower belly (pelvis), butt (buttocks), or legs. °· You have redness or puffiness (swelling) of your back. °· You have a burning feeling when you pee. °· You have pain that gets worse when you lie down. °· You have pain that wakes you from your sleep. °· Your pain is worse than past pain. °· Your pain lasts longer than 4 weeks. °· You are suddenly losing weight without reason. °MAKE SURE YOU:  °· Understand these instructions. °· Will watch this condition. °· Will get help right away if you are not doing well or get worse. °  °This information is not intended to replace advice given to you by your health care provider. Make sure you discuss any questions you have with your health care provider. °  °Document Released: 11/14/2007 Document Revised: 10/26/2014 Document Reviewed: 06/16/2011 °[employer]vier Interactive Patient Education ©2016 [employer] ° ° °

## 2015-03-12 NOTE — ED Notes (Signed)
MD at bedside. 

## 2015-03-14 DIAGNOSIS — M47816 Spondylosis without myelopathy or radiculopathy, lumbar region: Secondary | ICD-10-CM | POA: Diagnosis not present

## 2015-03-24 ENCOUNTER — Other Ambulatory Visit: Payer: Self-pay | Admitting: *Deleted

## 2015-03-24 MED ORDER — FLUTICASONE PROPIONATE 50 MCG/ACT NA SUSP: 2.0000 | Freq: Every day | NASAL | Status: DC | PRN

## 2015-03-24 MED ORDER — ALBUTEROL SULFATE HFA 108 (90 BASE) MCG/ACT IN AERS: 2.0000 | INHALATION_SPRAY | Freq: Four times a day (QID) | RESPIRATORY_TRACT | Status: DC | PRN

## 2015-04-06 DIAGNOSIS — M47816 Spondylosis without myelopathy or radiculopathy, lumbar region: Secondary | ICD-10-CM | POA: Diagnosis not present

## 2015-04-06 DIAGNOSIS — M5136 Other intervertebral disc degeneration, lumbar region: Secondary | ICD-10-CM | POA: Diagnosis not present

## 2015-04-07 ENCOUNTER — Encounter (HOSPITAL_COMMUNITY): Payer: Self-pay | Admitting: Psychiatry

## 2015-04-07 ENCOUNTER — Ambulatory Visit (INDEPENDENT_AMBULATORY_CARE_PROVIDER_SITE_OTHER): Payer: Medicare Other | Admitting: Psychiatry

## 2015-04-07 VITALS — BP 124/70 | Ht 65.0 in | Wt 173.0 lb

## 2015-04-07 DIAGNOSIS — F341 Dysthymic disorder: Secondary | ICD-10-CM | POA: Diagnosis not present

## 2015-04-07 NOTE — Progress Notes (Signed)
Patient ID: Jamie Pitts, female   DOB: [birth year], 82 y.o.   MRN: [id number] Patient ID: Jamie Pitts, female   DOB: 11-29-1933, 82 y.o.   MRN: [id number] Patient ID: Jamie Pitts, female   DOB: Jul 13, 1933, 82 y.o.   MRN: [id number] Patient ID: Jamie Pitts, female   DOB: [birth year], 82 y.o.   MRN: [id number] Patient ID: Jamie Pitts, female   DOB: 31-Aug-1933, 82 y.o.   MRN: [id number] Patient ID: Jamie Pitts, female   DOB: Oct 05, 1933, 82 y.o.   MRN: [id number] Patient ID: Jamie Pitts, female   DOB: 13-May-1933, 82 y.o.   MRN: [id number] Patient ID: Jamie Pitts, female   DOB: 12/07/1933, 82 y.o.   MRN: [id number] Patient ID: Jamie Pitts, female   DOB: Oct 02, 1933, 82 y.o.   MRN: [id number] Patient ID: Jamie Pitts, female   DOB: 1933-07-20, 82 y.o.   MRN: [id number] Patient ID: Jamie Pitts, female   DOB: [birth year], 82 y.o.   MRN: [id number] [facility] [id number] Progress Note [name] MRN: [id number] DOB: April 16, 1933 Age: 82 y.o.  Date: 04/07/2015 Start Time: 10:20 AM End Time: 10:50 AM  Chief Complaint: Chief Complaint  Patient presents with  . Hallucinations  . Anxiety  . Follow-up   Subjective: "The voices are gone most of the time"  This patient is a 82 year old married white female who lives with her husband in [place]. She has one daughter and 3 grandchildren. She is retired from the [employer].  Apparently the patient had some sort of psychotic episode in 2013. She was admitted to [facility] because she was hearing voices at home. The voices got so bad that she cannot put a gun under the bed and wanted to shoot him. The patient is a poor historian and is hard of hearing and gets easily confused. She was placed on various medicines in the hospital which have been continued. It's unclear if she is medication compliant. For example she's not taking the Cogentin. She repeats herself a lot.  The patient states she still hearing voices  every day. They're not frightening her like they were before but there seemed to her and she can't always get them to stop. She sleeping pretty well she denies being depressed but she is obviously not thinking clearly. She mentions that she's been diagnosed with parkinsonism but she doesn't have a tremor.  The patient returns after 4 weeks. Last time she states that she had fallen in her house on January 3 and after that the voices got worse. I increased her Risperdal to 2 mg twice a day. She states she is feeling good at this dosage and she is no longer hearing the voices other than once in a great while. When she does hear them they're friendly and not frightening. She still feels competent to take care of her 16 year-old grandson 3 days a week. She denies being depressed or anxious and thinks her medications are helpful. She is sleeping well at night   History of Chief Complaint:   At age 43 pt went to a "fat farm" to fatten her up.  She remembers her father paid more attention to her than her mother did.  She experienced some anxiety when she moved [place].  She was in [employer] for 2 years, then she was the Glass blower/designer for [employer] and then got into [employer].  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  She noted feeling very home sick to go back to [place] her home  place in 2010.  Her sister had been very nervous and she was beginning to feel anxious and was started on Klonopin.  ISince then she noted songs for 3 or 4 years.  Then in October of 2013, she started hearing voices non stop and she lost 30 pounds. This prompted her to get a gun and put it under the bed and the family got concerned.  She was admitted to [facility] on January 24th.  She was stopped from the Klonopin and Lithium (pt doesn't understand how she got on that) and placed on Ripserdal, Trazodone, Lamictal, and Effexor with pretty good results, except she has very dry mouth at nigh  She has noted blurred vision  since starting the meds.  She is getting new glasses for herself.  Anxiety Symptoms include confusion, decreased concentration and nervous/anxious behavior. Patient reports no dizziness or suicidal ideas.    Depression        Associated symptoms include decreased concentration.  Associated symptoms include no headaches and no suicidal ideas.  Past medical history includes anxiety.    Review of Systems  HENT: Negative.   Gastrointestinal: Negative.   Genitourinary: Negative.   Neurological: Positive for weakness and light-headedness. Negative for dizziness, tremors, seizures, syncope, facial asymmetry, speech difficulty, numbness and headaches.       Lightheaded if gets up too fast.  Psychiatric/Behavioral: Positive for depression, confusion, dysphoric mood and decreased concentration. Negative for suicidal ideas, hallucinations, behavioral problems, sleep disturbance, self-injury and agitation. The patient is nervous/anxious. The patient is not hyperactive.    Physical Exam  Depressive Symptoms: none now  (Hypo) Manic Symptoms:   None  Anxiety Symptoms: Excessive Worry:  Yes Panic Symptoms:  No Agoraphobia:  No Obsessive Compulsive: Yes  Symptoms: ordliness Specific Phobias:  Yes Social Anxiety:  No  Psychotic Symptoms:  None  PTSD Symptoms: Ever had a traumatic exposure:  Yes Had a traumatic exposure in the last month:  No No other features  Traumatic Brain Injury: No   Past Psychiatric History: Diagnosis: Depression  Hospitalizations: once [facility]  Outpatient Care: PCP  Substance Abuse Care: none  Self-Mutilation: none  Suicidal Attempts: none  Violent Behaviors: one episode of getting a gun, but now sees that that is not the way to handle her voices    Allergies: Allergies  Allergen Reactions  . Amoxicillin Hives    Sores in mouth  . Morphine And Related Itching    Can tolerate if necessary  . Sulfonamide Derivatives Hives    Sores in mouth  . Valium  [Diazepam] Itching    Can tolerate if necessary   Past Medical History:   Past Medical History  Diagnosis Date  . GERD (gastroesophageal reflux disease)     intermittent, treated with tums  . Hypothyroidism   . Scoliosis   . Hiatal hernia   . Adenomatous polyp     x1  . Lumbago   . Severe recurrent depression with psychosis ([place])   . Diverticulitis   . Personal history of kidney stones     x1  . ALLERGIC RHINITIS 12/20/2006  . Osteoporosis 2007    declined prolia  . Vitamin D deficiency   . Chronic anxiety   . Insomnia   . Postmenopausal atrophic vaginitis 2016    rec estrace cream - [place]  . Recurrent UTI   . Urinary retention    Surgical History: Past Surgical History  Procedure Laterality Date  . Appendectomy  1954  . Cholecystectomy    .  Abdominal hysterectomy  1979    ectopic pregnancy with one ovary removed  . Tonsillectomy    . Oophorectomy Right 1965    Ectopic pregnancy, removal of right ovary and tube- 1960  . Esophagogastroduodenoscopy    . Rotator cuff repair Right   . Knee surgery Left 2012    L medial meniscal tear   History of Loss of Consciousness:  No Seizure History:  No Cardiac History:  No Allergies: Allergies  Allergen Reactions  . Amoxicillin Hives    Sores in mouth  . Morphine And Related Itching    Can tolerate if necessary  . Sulfonamide Derivatives Hives    Sores in mouth  . Valium [Diazepam] Itching    Can tolerate if necessary   Medical History: Past Medical History  Diagnosis Date  . GERD (gastroesophageal reflux disease)     intermittent, treated with tums  . Hypothyroidism   . Scoliosis   . Hiatal hernia   . Adenomatous polyp     x1  . Lumbago   . Severe recurrent depression with psychosis ([place])   . Diverticulitis   . Personal history of kidney stones     x1  . ALLERGIC RHINITIS 12/20/2006  . Osteoporosis 2007    declined prolia  . Vitamin D deficiency   . Chronic anxiety   . Insomnia   . Postmenopausal  atrophic vaginitis 2016    rec estrace cream - [place]  . Recurrent UTI   . Urinary retention    Surgical History: Past Surgical History  Procedure Laterality Date  . Appendectomy  1954  . Cholecystectomy    . Abdominal hysterectomy  1979    ectopic pregnancy with one ovary removed  . Tonsillectomy    . Oophorectomy Right 1965    Ectopic pregnancy, removal of right ovary and tube- 1960  . Esophagogastroduodenoscopy    . Rotator cuff repair Right   . Knee surgery Left 2012    L medial meniscal tear   Family History: family history includes Anxiety disorder in her grandchild; Cancer in her sister and sister; Depression in her grandchild; Diabetes in her brother, father, mother, and sister; Healthy in her sister; Schizophrenia in her sister. There is no history of Alcohol abuse, Bipolar disorder, Dementia, Drug abuse, OCD, Paranoid behavior, Seizures, Sexual abuse, or Physical abuse. Reviewed and no changes noted.  Current Medications:  Current Outpatient Prescriptions  Medication Sig Dispense Refill  . albuterol (PROVENTIL HFA;VENTOLIN HFA) 108 (90 Base) MCG/ACT inhaler Inhale 2 puffs into the lungs every 6 (six) hours as needed (cough). 3 Inhaler 3  . benztropine (COGENTIN) 0.5 MG tablet Take 1 tablet (0.5 mg total) by mouth 3 (three) times daily as needed for tremors. 180 tablet 2  . bisacodyl (DULCOLAX) 10 MG suppository Place 1 suppository (10 mg total) rectally daily as needed for moderate constipation. 12 suppository 0  . Calcium Carbonate-Vitamin D (CALCIUM-D PO) Take 1 tablet by mouth daily.    . clonazePAM (KLONOPIN) 1 MG tablet Take 1 tablet (1 mg total) by mouth at bedtime. 90 tablet 2  . cyclobenzaprine (FLEXERIL) 10 MG tablet Take 10 mg by mouth 3 (three) times daily as needed for muscle spasms.    Marland Kitchen dicyclomine (BENTYL) 10 MG capsule Take 10 mg by mouth daily as needed for spasms. For diverticulitis    . fluticasone (FLONASE) 50 MCG/ACT nasal spray Place 2 sprays into  both nostrils daily as needed for allergies or rhinitis. 16 g 3  . hydrocortisone (ANUSOL-HC)  25 MG suppository Place 1 suppository (25 mg total) rectally 2 (two) times daily as needed for hemorrhoids or itching. 12 suppository 0  . lamoTRIgine (LAMICTAL) 25 MG tablet Take 2 tablets (50 mg total) by mouth 2 (two) times daily. 360 tablet 2  . levothyroxine (SYNTHROID, LEVOTHROID) 88 MCG tablet TAKE 1 TABLET EVERY MORNING (NEED OFFICE VISIT AFTER ONE REFILL) 90 tablet 1  . montelukast (SINGULAIR) 10 MG tablet TAKE 1 TABLET AT BEDTIME 90 tablet 2  . risperiDONE (RISPERDAL) 0.25 MG tablet Take 2 tablets (0.5 mg total) by mouth 2 (two) times daily. 60 tablet 2  . risperiDONE (RISPERDAL) 1 MG tablet Take 2 in the am and 2 at bedtime 120 tablet 2  . traMADol (ULTRAM) 50 MG tablet Take 50 mg by mouth every 6 (six) hours as needed for moderate pain.    Marland Kitchen venlafaxine (EFFEXOR) 37.5 MG tablet Take 1 tablet (37.5 mg total) by mouth 2 (two) times daily. 180 tablet 2  . Vitamin D, Ergocalciferol, (DRISDOL) 50000 UNITS CAPS capsule TAKE 1 CAPSULE EVERY 7 DAYS 12 capsule 2   No current facility-administered medications for this visit.    Previous Psychotropic Medications:  Medication Dose   Klonopin     Lithium ?    Risperdal    Lamictal    Effexor    Trazodone    Substance Abuse History in the last 12 months: Substance Age of 1st Use Last Use Amount Specific Type  Nicotine  none        Alcohol  20  March 13      Cannabis  none        Opiates  none        Cocaine  none        Methamphetamines  none        LSD  none        Ecstasy  none         Benzodiazepines  [age]      Caffeine  childhood  this AM      Inhalants  none        Others:       sugar  childhood  this AM     Medical Consequences of Substance Abuse: none  Legal Consequences of Substance Abuse: none  Family Consequences of Substance Abuse: none  Social History: Current Place of Residence: [address] [place] [address] Place of Birth: [place], [place] Family Members: husband Marital Status:  Married Children: 1  Sons: 0  Daughters: 1 Relationships: husbandf Education:  Dentist Problems/Performance: good Religious Beliefs/Practices: [employer] History of Abuse: none Occupational Experiences: clerical, office, Architect History:  Press photographer History: none Hobbies/Interests: walking, reading, playing cards  Mental Status Examination/Evaluation: Objective:  Appearance: Casual neatly dressed   Eye Contact::  Good  Speech: Clear and coherent   Volume:  Normal  Mood:  neutral  Affect:  Congruent  Thought Process fairly organized today   Thought content: Auditory hallucinations have diminished greatly     Suicidal Thoughts:  No  Homicidal Thoughts:  No  Judgement:  Good  Insight:  Good  Psychomotor Activity:  Normal  Akathisia:  No  Handed:  Right  AIMS (if indicated):    Assets:  Communication Skills Desire for Improvement   Lab Results:  Results for orders placed or performed in visit on 07/28/14 (from the past 8736 hour(s))  TSH   Collection Time: 07/28/14  1:54 PM  Result Value Ref Range   TSH 0.65 0.35 - 4.50 uIU/mL  T4, free   Collection Time: 07/28/14  1:54 PM  Result Value Ref Range   Free T4 1.02 0.60 - 1.60 ng/dL  Results for orders placed or performed during the hospital encounter of 05/08/14 (from the past 8736 hour(s))  Basic metabolic panel   Collection Time: 05/08/14  1:09 AM  Result Value Ref Range   Sodium 134 (L) 135 - 145 mmol/L   Potassium 3.8 3.5 - 5.1 mmol/L   Chloride 102 96 - 112 mmol/L   CO2 28 19 - 32 mmol/L   Glucose, Bld 86 70 - 99 mg/dL   BUN 9 6 - 23 mg/dL   Creatinine, Ser 0.89 0.50 - 1.10 mg/dL   Calcium 8.8 8.4 - 10.5 mg/dL   GFR calc non Af Amer 60 (L) >90 mL/min   GFR calc Af Amer 70 (L) >90 mL/min   Anion gap 4 (L) 5 - 15  CBC with Differential   Collection Time: 05/08/14  1:09 AM  Result  Value Ref Range   WBC 8.7 4.0 - 10.5 K/uL   RBC 4.21 3.87 - 5.11 MIL/uL   Hemoglobin 12.2 12.0 - 15.0 g/dL   HCT 37.6 36.0 - 46.0 %   MCV 89.3 78.0 - 100.0 fL   MCH 29.0 26.0 - 34.0 pg   MCHC 32.4 30.0 - 36.0 g/dL   RDW 13.4 11.5 - 15.5 %   Platelets 219 150 - 400 K/uL   Neutrophils Relative % 65 43 - 77 %   Neutro Abs 5.6 1.7 - 7.7 K/uL   Lymphocytes Relative 24 12 - 46 %   Lymphs Abs 2.1 0.7 - 4.0 K/uL   Monocytes Relative 9 3 - 12 %   Monocytes Absolute 0.8 0.1 - 1.0 K/uL   Eosinophils Relative 2 0 - 5 %   Eosinophils Absolute 0.2 0.0 - 0.7 K/uL   Basophils Relative 0 0 - 1 %   Basophils Absolute 0.0 0.0 - 0.1 K/uL  Urine culture   Collection Time: 05/08/14  1:40 AM  Result Value Ref Range   Specimen Description URINE, CATHETERIZED    Special Requests NONE    Colony Count NO GROWTH Performed at [facility]     Culture NO GROWTH Performed at [facility]     Report Status 05/09/2014 FINAL   Urinalysis, Routine w reflex microscopic   Collection Time: 05/08/14  1:40 AM  Result Value Ref Range   Color, Urine YELLOW YELLOW   APPearance CLEAR CLEAR   Specific Gravity, Urine 1.009 1.005 - 1.030   pH 6.0 5.0 - 8.0   Glucose, UA NEGATIVE NEGATIVE mg/dL   Hgb urine dipstick NEGATIVE NEGATIVE   Bilirubin Urine NEGATIVE NEGATIVE   Ketones, ur NEGATIVE NEGATIVE mg/dL   Protein, ur NEGATIVE NEGATIVE mg/dL   Urobilinogen, UA 0.2 0.0 - 1.0 mg/dL   Nitrite NEGATIVE NEGATIVE   Leukocytes, UA NEGATIVE NEGATIVE  TSH   Collection Time: 05/08/14  2:00 PM  Result Value Ref Range   TSH 0.481 0.350 - 4.500 uIU/mL   Assessment:    AXIS I  major depression with psychotic features, rule out dementia   AXIS II Deferred  AXIS III Past Medical History  Diagnosis Date  . GERD (gastroesophageal reflux disease)     intermittent, treated with tums  . Hypothyroidism   . Scoliosis   . Hiatal hernia   . Adenomatous polyp  x1  . Lumbago   . Severe recurrent  depression with psychosis ([place])   . Diverticulitis   . Personal history of kidney stones     x1  . ALLERGIC RHINITIS 12/20/2006  . Osteoporosis 2007    declined prolia  . Vitamin D deficiency   . Chronic anxiety   . Insomnia   . Postmenopausal atrophic vaginitis 2016    rec estrace cream - [place]  . Recurrent UTI   . Urinary retention      AXIS IV economic problems and other psychosocial or environmental problems  AXIS V 61-70 mild symptoms   Treatment Plan/Recommendations:  Laboratory:    Psychotherapy: supportive  Medications: Risperdal, Lamictal, Effexor  Routine PRN Medications:  No  Consultations: none  Safety Concerns:  none  Other:     Plan/Discussion: I took her vitals.  I reviewed CC, tobacco/med/surg Hx, meds effects/ side effects, problem list, therapies and responses as well as current situation/symptoms discussed options. Continue continue Risperdal 2 mg twice  for psychosis and benztropine for possible side effects from Risperdal. She will continue Effexor for depression and Lamictal for mood stabilization. She will continue clonazepam to 1 mg at bedtime to help with sleep She will call if hallucinations worsen She'll return in 3 months See orders and pt instructions for more details.  MEDICATIONS this encounter: No orders of the defined types were placed in this encounter.    Medical Decision Making Problem Points:  Established problem, stable/improving (1), New problem, with no additional work-up planned (3), Review of last therapy session (1) and Review of psycho-social stressors (1) Data Points:  Review or order clinical lab tests (1) Review of medication regiment & side effects (2) Review of new medications or change in dosage (2)  four-week's I certify that outpatient services furnished can reasonably be expected to improve the patient's condition.   Levonne Spiller, MD

## 2015-04-21 DIAGNOSIS — R05 Cough: Secondary | ICD-10-CM | POA: Diagnosis not present

## 2015-04-21 DIAGNOSIS — H6121 Impacted cerumen, right ear: Secondary | ICD-10-CM | POA: Diagnosis not present

## 2015-04-21 DIAGNOSIS — H7201 Central perforation of tympanic membrane, right ear: Secondary | ICD-10-CM | POA: Diagnosis not present

## 2015-05-02 DIAGNOSIS — M47816 Spondylosis without myelopathy or radiculopathy, lumbar region: Secondary | ICD-10-CM | POA: Diagnosis not present

## 2015-05-02 DIAGNOSIS — M546 Pain in thoracic spine: Secondary | ICD-10-CM | POA: Diagnosis not present

## 2015-05-03 ENCOUNTER — Telehealth (HOSPITAL_COMMUNITY): Payer: Self-pay | Admitting: *Deleted

## 2015-05-03 NOTE — Telephone Encounter (Signed)
phone call from patient, said the voices are getting stronger, she wants to know how much Risperidone she can take.

## 2015-05-03 NOTE — Telephone Encounter (Signed)
Pt taking incorrect dosage needs to take 2 mg bid

## 2015-05-03 NOTE — Telephone Encounter (Signed)
lmtcb

## 2015-05-04 NOTE — Telephone Encounter (Signed)
Spoke with pt and informed her of what provider stated and pt showed understanding.

## 2015-05-04 NOTE — Telephone Encounter (Signed)
Called number provided in chart and husband stated pt was not in and lmtcb number provided

## 2015-05-10 DIAGNOSIS — N907 Vulvar cyst: Secondary | ICD-10-CM | POA: Diagnosis not present

## 2015-06-22 ENCOUNTER — Encounter: Payer: Self-pay | Admitting: Family Medicine

## 2015-06-22 ENCOUNTER — Ambulatory Visit (INDEPENDENT_AMBULATORY_CARE_PROVIDER_SITE_OTHER): Payer: Medicare Other | Admitting: Family Medicine

## 2015-06-22 VITALS — BP 108/62 | HR 96 | Temp 97.3°F | Ht 65.0 in | Wt 181.5 lb

## 2015-06-22 DIAGNOSIS — R05 Cough: Secondary | ICD-10-CM | POA: Diagnosis not present

## 2015-06-22 DIAGNOSIS — J449 Chronic obstructive pulmonary disease, unspecified: Secondary | ICD-10-CM | POA: Diagnosis not present

## 2015-06-22 DIAGNOSIS — R059 Cough, unspecified: Secondary | ICD-10-CM

## 2015-06-22 LAB — POCT INFLUENZA A/B
INFLUENZA A, POC: NEGATIVE
INFLUENZA B, POC: NEGATIVE

## 2015-06-22 MED ORDER — GUAIFENESIN-CODEINE 100-10 MG/5ML PO SYRP: 5.0000 mL | ORAL_SOLUTION | Freq: Every evening | ORAL | Status: DC | PRN

## 2015-06-22 MED ORDER — BENZONATATE 200 MG PO CAPS: 200.0000 mg | ORAL_CAPSULE | Freq: Two times a day (BID) | ORAL | Status: DC | PRN

## 2015-06-22 NOTE — Assessment & Plan Note (Signed)
No current evidence of COPD exacerbation given no change in sputum and no wheeze.

## 2015-06-22 NOTE — Assessment & Plan Note (Signed)
No sign of bacterial infection or COPD exacerbation.  Treat with symptomatic care. Reviewed viral timeline.

## 2015-06-22 NOTE — Patient Instructions (Signed)
Can use codeine cough syrup at night for cough...makes you sleepy.  During the day use benzonatate  For cough as needed.. Does not make you sleepy.  Rest, fluids.  Call if not improving in next 5-7 day, new fever or worsening shortnes of breath.

## 2015-06-22 NOTE — Progress Notes (Signed)
Pre visit review using our clinic review tool, if applicable. No additional management support is needed unless otherwise documented below in the visit note. 

## 2015-06-22 NOTE — Progress Notes (Signed)
   Subjective:    Patient ID: Jamie Pitts, female    DOB: 1933/10/29, 82 y.o.   MRN: [id number]  Cough This is a new problem. The current episode started in the past 7 days (5 days). The problem has been gradually worsening. The cough is non-productive. Associated symptoms include myalgias, nasal congestion and shortness of breath. Pertinent negatives include no chills, ear congestion, ear pain, fever, rash, sore throat or wheezing. Associated symptoms comments: Dry mouth, vomiting after coughing. She has tried OTC cough suppressant (has not used albuterol) for the symptoms. The treatment provided mild relief. Her past medical history is significant for COPD.    Sick contact: daughter with the flu.  Social History /Family History/Past Medical History reviewed and updated if needed.   Review of Systems  Constitutional: Negative for fever and chills.  HENT: Negative for ear pain and sore throat.   Respiratory: Positive for cough and shortness of breath. Negative for wheezing.   Musculoskeletal: Positive for myalgias.  Skin: Negative for rash.       Objective:   Physical Exam  Constitutional: Vital signs are normal. She appears well-developed and well-nourished. She is cooperative.  Non-toxic appearance. She does not appear ill. No distress.  HENT:  Head: Normocephalic.  Right Ear: Hearing, tympanic membrane, external ear and ear canal normal. Tympanic membrane is not erythematous, not retracted and not bulging.  Left Ear: Hearing, tympanic membrane, external ear and ear canal normal. Tympanic membrane is not erythematous, not retracted and not bulging.  Nose: Mucosal edema and rhinorrhea present. Right sinus exhibits no maxillary sinus tenderness and no frontal sinus tenderness. Left sinus exhibits no maxillary sinus tenderness and no frontal sinus tenderness.  Mouth/Throat: Uvula is midline, oropharynx is clear and moist and mucous membranes are normal.  Eyes: Conjunctivae, EOM and  lids are normal. Pupils are equal, round, and reactive to light. Lids are everted and swept, no foreign bodies found.  Neck: Trachea normal and normal range of motion. Neck supple. Carotid bruit is not present. No thyroid mass and no thyromegaly present.  Cardiovascular: Normal rate, regular rhythm, S1 normal, S2 normal, normal heart sounds, intact distal pulses and normal pulses.  Exam reveals no gallop and no friction rub.   No murmur heard. Pulmonary/Chest: Effort normal and breath sounds normal. No tachypnea. No respiratory distress. She has no decreased breath sounds. She has no wheezes. She has no rhonchi. She has no rales.  Neurological: She is alert.  Skin: Skin is warm, dry and intact. No rash noted.  Psychiatric: Her speech is normal and behavior is normal. Judgment normal. Her mood appears not anxious. Cognition and memory are normal. She does not exhibit a depressed mood.          Assessment & Plan:

## 2015-06-23 ENCOUNTER — Ambulatory Visit: Payer: Self-pay | Admitting: Family Medicine

## 2015-06-25 ENCOUNTER — Other Ambulatory Visit: Payer: Self-pay | Admitting: Endocrinology

## 2015-07-05 ENCOUNTER — Ambulatory Visit (HOSPITAL_COMMUNITY): Payer: Self-pay | Admitting: Psychiatry

## 2015-07-06 DIAGNOSIS — M47816 Spondylosis without myelopathy or radiculopathy, lumbar region: Secondary | ICD-10-CM | POA: Diagnosis not present

## 2015-07-06 DIAGNOSIS — M5136 Other intervertebral disc degeneration, lumbar region: Secondary | ICD-10-CM | POA: Diagnosis not present

## 2015-07-06 DIAGNOSIS — M5134 Other intervertebral disc degeneration, thoracic region: Secondary | ICD-10-CM | POA: Diagnosis not present

## 2015-07-06 DIAGNOSIS — M546 Pain in thoracic spine: Secondary | ICD-10-CM | POA: Diagnosis not present

## 2015-07-21 DIAGNOSIS — M5134 Other intervertebral disc degeneration, thoracic region: Secondary | ICD-10-CM | POA: Diagnosis not present

## 2015-07-27 DIAGNOSIS — M546 Pain in thoracic spine: Secondary | ICD-10-CM | POA: Diagnosis not present

## 2015-07-27 DIAGNOSIS — M47816 Spondylosis without myelopathy or radiculopathy, lumbar region: Secondary | ICD-10-CM | POA: Diagnosis not present

## 2015-07-27 DIAGNOSIS — M5136 Other intervertebral disc degeneration, lumbar region: Secondary | ICD-10-CM | POA: Diagnosis not present

## 2015-07-27 DIAGNOSIS — M5134 Other intervertebral disc degeneration, thoracic region: Secondary | ICD-10-CM | POA: Diagnosis not present

## 2015-08-01 ENCOUNTER — Encounter (HOSPITAL_COMMUNITY): Payer: Self-pay | Admitting: Psychiatry

## 2015-08-01 ENCOUNTER — Ambulatory Visit (INDEPENDENT_AMBULATORY_CARE_PROVIDER_SITE_OTHER): Payer: Medicare Other | Admitting: Psychiatry

## 2015-08-01 VITALS — BP 111/74 | HR 86 | Ht 65.0 in | Wt 181.2 lb

## 2015-08-01 DIAGNOSIS — F341 Dysthymic disorder: Secondary | ICD-10-CM

## 2015-08-01 DIAGNOSIS — F29 Unspecified psychosis not due to a substance or known physiological condition: Secondary | ICD-10-CM | POA: Diagnosis not present

## 2015-08-01 MED ORDER — LAMOTRIGINE 25 MG PO TABS: 50.0000 mg | ORAL_TABLET | Freq: Two times a day (BID) | ORAL | Status: DC

## 2015-08-01 MED ORDER — BENZTROPINE MESYLATE 0.5 MG PO TABS: 0.5000 mg | ORAL_TABLET | Freq: Three times a day (TID) | ORAL | Status: DC | PRN

## 2015-08-01 MED ORDER — RISPERIDONE 1 MG PO TABS: 1.0000 mg | ORAL_TABLET | Freq: Two times a day (BID) | ORAL | Status: DC

## 2015-08-01 MED ORDER — VENLAFAXINE HCL 37.5 MG PO TABS: 37.5000 mg | ORAL_TABLET | Freq: Two times a day (BID) | ORAL | Status: DC

## 2015-08-01 MED ORDER — CLONAZEPAM 1 MG PO TABS: 1.0000 mg | ORAL_TABLET | Freq: Every day | ORAL | Status: DC

## 2015-08-01 NOTE — Progress Notes (Signed)
Patient ID: Jamie Pitts, female   DOB: Sep 25, 1933, 82 y.o.   MRN: [id number] Patient ID: Jamie Pitts, female   DOB: [birth year], 82 y.o.   MRN: [id number] Patient ID: Jamie Pitts, female   DOB: [birth year], 82 y.o.   MRN: [id number] Patient ID: Jamie Pitts, female   DOB: 04-17-1933, 82 y.o.   MRN: [id number] Patient ID: Jamie Pitts, female   DOB: 1934/02/19, 82 y.o.   MRN: [id number] Patient ID: Jamie Pitts, female   DOB: [birth year], 82 y.o.   MRN: [id number] Patient ID: Jamie Pitts, female   DOB: 1933-04-19, 82 y.o.   MRN: [id number] Patient ID: Jamie Pitts, female   DOB: 10-11-1933, 82 y.o.   MRN: [id number] Patient ID: Jamie Pitts, female   DOB: [birth year], 82 y.o.   MRN: [id number] Patient ID: Jamie Pitts, female   DOB: Feb 21, 1933, 82 y.o.   MRN: [id number] Patient ID: Jamie Pitts, female   DOB: 06-03-1933, 82 y.o.   MRN: [id number] Patient ID: Jamie Pitts, female   DOB: December 02, 1933, 82 y.o.   MRN: [id number] [facility] [id number] Progress Note Jamie Pitts MRN: [id number] DOB: 1933-07-29 Age: 82 y.o.  Date: 08/01/2015 Start Time: 10:20 AM End Time: 10:50 AM  Chief Complaint: Chief Complaint  Patient presents with  . Hallucinations  . Anxiety  . Follow-up   Subjective: "The voices are gone most of the time"  This patient is a 82 year old married white female who lives with her husband in [place]. She has one daughter and 3 grandchildren. She is retired from the [employer].  Apparently the patient had some sort of psychotic episode in 2013. She was admitted to [facility] because she was hearing voices at home. The voices got so bad that she cannot put a gun under the bed and wanted to shoot him. The patient is a poor historian and is hard of hearing and gets easily confused. She was placed on various medicines in the hospital which have been continued. It's unclear if she is medication compliant. For example she's not taking the  Cogentin. She repeats herself a lot.  The patient states she still hearing voices every day. They're not frightening her like they were before but there seemed to her and she can't always get them to stop. She sleeping pretty well she denies being depressed but she is obviously not thinking clearly. She mentions that she's been diagnosed with parkinsonism but she doesn't have a tremor.  The patient returns after 4 months. She called a few weeks ago saying her auditory hallucinations were getting worse. I increased her Risperdal to 2 mg twice a day. However she is back down to 1 mg twice a day because she got shaky on that dose. She states that she is talked her self out of the voices and no longer hears them. She states that she is convinced that they weren't real and doesn't think she'll have them again. She continues to take Lamictal and her mood is good and her anxiety is well controlled.   History of Chief Complaint:   At age 82 pt went to a "fat farm" to fatten her up.  She remembers her father paid more attention to her than her mother did.  She experienced some anxiety when she [place].  She was in [employer] for 2 years, then she was the Glass blower/designer for [employer] and then got into [employer].  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  She noted feeling very home sick to  go back to [place] her home place in 2010.  Her sister had been very nervous and she was beginning to feel anxious and was started on Klonopin.  ISince then she noted songs for 3 or 4 years.  Then in October of 2013, she started hearing voices non stop and she lost 30 pounds. This prompted her to get a gun and put it under the bed and the family got concerned.  She was admitted to [facility] on January 24th.  She was stopped from the Klonopin and Lithium (pt doesn't understand how she got on that) and placed on Ripserdal, Trazodone, Lamictal, and Effexor with pretty good results, except she has very dry mouth  at nigh  She has noted blurred vision since starting the meds.  She is getting new glasses for herself.  Anxiety Symptoms include confusion, decreased concentration and nervous/anxious behavior. Patient reports no dizziness or suicidal ideas.    Depression        Associated symptoms include decreased concentration.  Associated symptoms include no headaches and no suicidal ideas.  Past medical history includes anxiety.    Review of Systems  HENT: Negative.   Gastrointestinal: Negative.   Genitourinary: Negative.   Neurological: Positive for weakness and light-headedness. Negative for dizziness, tremors, seizures, syncope, facial asymmetry, speech difficulty, numbness and headaches.       Lightheaded if gets up too fast.  Psychiatric/Behavioral: Positive for depression, confusion, dysphoric mood and decreased concentration. Negative for suicidal ideas, hallucinations, behavioral problems, sleep disturbance, self-injury and agitation. The patient is nervous/anxious. The patient is not hyperactive.    Physical Exam  Depressive Symptoms: none now  (Hypo) Manic Symptoms:   None  Anxiety Symptoms: Excessive Worry:  Yes Panic Symptoms:  No Agoraphobia:  No Obsessive Compulsive: Yes  Symptoms: ordliness Specific Phobias:  Yes Social Anxiety:  No  Psychotic Symptoms:  None  PTSD Symptoms: Ever had a traumatic exposure:  Yes Had a traumatic exposure in the last month:  No No other features  Traumatic Brain Injury: No   Past Psychiatric History: Diagnosis: Depression  Hospitalizations: once [facility]  Outpatient Care: PCP  Substance Abuse Care: none  Self-Mutilation: none  Suicidal Attempts: none  Violent Behaviors: one episode of getting a gun, but now sees that that is not the way to handle her voices    Allergies: Allergies  Allergen Reactions  . Amoxicillin Hives    Sores in mouth  . Buprenorphine Hcl Itching    Can tolerate if necessary  . Morphine And Related  Itching    Can tolerate if necessary  . Sulfonamide Derivatives Hives    Sores in mouth  . Valium [Diazepam] Itching    Can tolerate if necessary   Past Medical History:   Past Medical History  Diagnosis Date  . GERD (gastroesophageal reflux disease)     intermittent, treated with tums  . Hypothyroidism   . Scoliosis   . Hiatal hernia   . Adenomatous polyp     x1  . Lumbago   . Severe recurrent depression with psychosis ([place])   . Diverticulitis   . Personal history of kidney stones     x1  . ALLERGIC RHINITIS 12/20/2006  . Osteoporosis 2007    declined prolia  . Vitamin D deficiency   . Chronic anxiety   . Insomnia   . Postmenopausal atrophic vaginitis 2016    rec estrace cream - [place]  . Recurrent UTI   . Urinary retention  Surgical History: Past Surgical History  Procedure Laterality Date  . Appendectomy  1954  . Cholecystectomy    . Abdominal hysterectomy  1979    ectopic pregnancy with one ovary removed  . Tonsillectomy    . Oophorectomy Right 1965    Ectopic pregnancy, removal of right ovary and tube- 1960  . Esophagogastroduodenoscopy    . Rotator cuff repair Right   . Knee surgery Left 2012    L medial meniscal tear   History of Loss of Consciousness:  No Seizure History:  No Cardiac History:  No Allergies: Allergies  Allergen Reactions  . Amoxicillin Hives    Sores in mouth  . Buprenorphine Hcl Itching    Can tolerate if necessary  . Morphine And Related Itching    Can tolerate if necessary  . Sulfonamide Derivatives Hives    Sores in mouth  . Valium [Diazepam] Itching    Can tolerate if necessary   Medical History: Past Medical History  Diagnosis Date  . GERD (gastroesophageal reflux disease)     intermittent, treated with tums  . Hypothyroidism   . Scoliosis   . Hiatal hernia   . Adenomatous polyp     x1  . Lumbago   . Severe recurrent depression with psychosis ([place])   . Diverticulitis   . Personal history of kidney stones      x1  . ALLERGIC RHINITIS 12/20/2006  . Osteoporosis 2007    declined prolia  . Vitamin D deficiency   . Chronic anxiety   . Insomnia   . Postmenopausal atrophic vaginitis 2016    rec estrace cream - [place]  . Recurrent UTI   . Urinary retention    Surgical History: Past Surgical History  Procedure Laterality Date  . Appendectomy  1954  . Cholecystectomy    . Abdominal hysterectomy  1979    ectopic pregnancy with one ovary removed  . Tonsillectomy    . Oophorectomy Right 1965    Ectopic pregnancy, removal of right ovary and tube- 1960  . Esophagogastroduodenoscopy    . Rotator cuff repair Right   . Knee surgery Left 2012    L medial meniscal tear   Family History: family history includes Anxiety disorder in her grandchild; Cancer in her sister and sister; Depression in her grandchild; Diabetes in her brother, father, mother, and sister; Healthy in her sister; Schizophrenia in her sister. There is no history of Alcohol abuse, Bipolar disorder, Dementia, Drug abuse, OCD, Paranoid behavior, Seizures, Sexual abuse, or Physical abuse. Reviewed and no changes noted.  Current Medications:  Current Outpatient Prescriptions  Medication Sig Dispense Refill  . albuterol (PROVENTIL HFA;VENTOLIN HFA) 108 (90 Base) MCG/ACT inhaler Inhale 2 puffs into the lungs every 6 (six) hours as needed (cough). 3 Inhaler 3  . benzonatate (TESSALON) 200 MG capsule Take 1 capsule (200 mg total) by mouth 2 (two) times daily as needed for cough. 20 capsule 0  . benztropine (COGENTIN) 0.5 MG tablet Take 1 tablet (0.5 mg total) by mouth 3 (three) times daily as needed for tremors. 180 tablet 2  . bisacodyl (DULCOLAX) 10 MG suppository Place 1 suppository (10 mg total) rectally daily as needed for moderate constipation. 12 suppository 0  . Calcium Carbonate-Vitamin D (CALCIUM-D PO) Take 1 tablet by mouth daily.    . clonazePAM (KLONOPIN) 1 MG tablet Take 1 tablet (1 mg total) by mouth at bedtime. 90 tablet 2  .  cyclobenzaprine (FLEXERIL) 10 MG tablet Take 10 mg by mouth 3 (  three) times daily as needed for muscle spasms.    Marland Kitchen dicyclomine (BENTYL) 10 MG capsule Take 10 mg by mouth daily as needed for spasms. For diverticulitis    . fluticasone (FLONASE) 50 MCG/ACT nasal spray Place 2 sprays into both nostrils daily as needed for allergies or rhinitis. 16 g 3  . guaiFENesin-codeine (ROBITUSSIN AC) 100-10 MG/5ML syrup Take 5-10 mLs by mouth at bedtime as needed for cough. 180 mL 0  . hydrocortisone (ANUSOL-HC) 25 MG suppository Place 1 suppository (25 mg total) rectally 2 (two) times daily as needed for hemorrhoids or itching. 12 suppository 0  . lamoTRIgine (LAMICTAL) 25 MG tablet Take 2 tablets (50 mg total) by mouth 2 (two) times daily. 360 tablet 2  . levothyroxine (SYNTHROID, LEVOTHROID) 88 MCG tablet TAKE 1 TABLET EVERY MORNING (NEED OFFICE VISIT AFTER ONE REFILL) 30 tablet 0  . montelukast (SINGULAIR) 10 MG tablet TAKE 1 TABLET AT BEDTIME 90 tablet 2  . risperiDONE (RISPERDAL) 1 MG tablet Take 1 tablet (1 mg total) by mouth 2 (two) times daily. 180 tablet 2  . traMADol (ULTRAM) 50 MG tablet Take 50 mg by mouth every 6 (six) hours as needed for moderate pain.    Marland Kitchen venlafaxine (EFFEXOR) 37.5 MG tablet Take 1 tablet (37.5 mg total) by mouth 2 (two) times daily. 180 tablet 2  . Vitamin D, Ergocalciferol, (DRISDOL) 50000 UNITS CAPS capsule TAKE 1 CAPSULE EVERY 7 DAYS 12 capsule 2   No current facility-administered medications for this visit.    Previous Psychotropic Medications:  Medication Dose   Klonopin     Lithium ?    Risperdal    Lamictal    Effexor    Trazodone    Substance Abuse History in the last 12 months: Substance Age of 1st Use Last Use Amount Specific Type  Nicotine  none        Alcohol  20  March 13      Cannabis  none        Opiates  none        Cocaine  none        Methamphetamines  none        LSD  none        Ecstasy  none         Benzodiazepines  [age]      Caffeine   childhood  this AM      Inhalants  none        Others:       sugar  childhood  this AM     Medical Consequences of Substance Abuse: none  Legal Consequences of Substance Abuse: none  Family Consequences of Substance Abuse: none  Social History: Current Place of Residence: [address] [place] [id number] Place of Birth: [place], [place] Family Members: husband Marital Status:  Married Children: 1  Sons: 0  Daughters: 1 Relationships: husbandf Education:  Dentist Problems/Performance: good Religious Beliefs/Practices: [employer] History of Abuse: none Occupational Experiences: clerical, office, Architect History:  Press photographer History: none Hobbies/Interests: walking, reading, playing cards  Mental Status Examination/Evaluation: Objective:  Appearance: Casual neatly dressed   Eye Contact::  Good  Speech: Clear and coherent   Volume:  Normal  Mood:  neutral  Affect:  Congruent  Thought Process fairly organized today   Thought content: Auditory hallucinations have diminished greatly , She claims not to hear them today at all     Suicidal Thoughts:  No  Homicidal Thoughts:  No  Judgement:  Good  Insight:  Good  Psychomotor Activity:  Normal  Akathisia:  No  Handed:  Right  AIMS (if indicated):    Assets:  Communication Skills Desire for Improvement   Lab Results:  Results for orders placed or performed in visit on 06/22/15 (from the past 8736 hour(s))  POCT Influenza A/B   Collection Time: 06/22/15  8:59 AM  Result Value Ref Range   Influenza A, POC Negative Negative   Influenza B, POC Negative Negative   Assessment:    AXIS I  major depression with psychotic features, rule out dementia   AXIS II Deferred  AXIS III Past Medical History  Diagnosis Date  . GERD (gastroesophageal reflux disease)     intermittent, treated with tums  . Hypothyroidism   . Scoliosis   . Hiatal hernia   . Adenomatous polyp     x1  .  Lumbago   . Severe recurrent depression with psychosis ([place])   . Diverticulitis   . Personal history of kidney stones     x1  . ALLERGIC RHINITIS 12/20/2006  . Osteoporosis 2007    declined prolia  . Vitamin D deficiency   . Chronic anxiety   . Insomnia   . Postmenopausal atrophic vaginitis 2016    rec estrace cream - [place]  . Recurrent UTI   . Urinary retention      AXIS IV economic problems and other psychosocial or environmental problems  AXIS V 61-70 mild symptoms   Treatment Plan/Recommendations:  Laboratory:    Psychotherapy: supportive  Medications: Risperdal, Lamictal, Effexor  Routine PRN Medications:  No  Consultations: none  Safety Concerns:  none  Other:     Plan/Discussion: I took her vitals.  I reviewed CC, tobacco/med/surg Hx, meds effects/ side effects, problem list, therapies and responses as well as current situation/symptoms discussed options. Continue continue Risperdal 1 mg twice  for psychosis and benztropine for possible side effects from Risperdal. She will continue Effexor for depression and Lamictal for mood stabilization. She will continue clonazepam to 1 mg at bedtime to help with sleep She will call if hallucinations worsen She'll return in 3 months See orders and pt instructions for more details.  MEDICATIONS this encounter: Meds ordered this encounter  Medications  . DISCONTD: risperiDONE (RISPERDAL) 0.25 MG tablet    Sig: Take 0.25 mg by mouth as needed.  . venlafaxine (EFFEXOR) 37.5 MG tablet    Sig: Take 1 tablet (37.5 mg total) by mouth 2 (two) times daily.    Dispense:  180 tablet    Refill:  2  . risperiDONE (RISPERDAL) 1 MG tablet    Sig: Take 1 tablet (1 mg total) by mouth 2 (two) times daily.    Dispense:  180 tablet    Refill:  2  . lamoTRIgine (LAMICTAL) 25 MG tablet    Sig: Take 2 tablets (50 mg total) by mouth 2 (two) times daily.    Dispense:  360 tablet    Refill:  2  . benztropine (COGENTIN) 0.5 MG tablet    Sig:  Take 1 tablet (0.5 mg total) by mouth 3 (three) times daily as needed for tremors.    Dispense:  180 tablet    Refill:  2  . clonazePAM (KLONOPIN) 1 MG tablet    Sig: Take 1 tablet (1 mg total) by mouth at bedtime.    Dispense:  90 tablet    Refill:  2  Medical Decision Making Problem Points:  Established problem, stable/improving (1), New problem, with no additional work-up planned (3), Review of last therapy session (1) and Review of psycho-social stressors (1) Data Points:  Review or order clinical lab tests (1) Review of medication regiment & side effects (2) Review of new medications or change in dosage (2)  four-week's I certify that outpatient services furnished can reasonably be expected to improve the patient's condition.   Levonne Spiller, MD

## 2015-08-30 DIAGNOSIS — H6123 Impacted cerumen, bilateral: Secondary | ICD-10-CM | POA: Diagnosis not present

## 2015-08-30 DIAGNOSIS — H722X1 Other marginal perforations of tympanic membrane, right ear: Secondary | ICD-10-CM | POA: Diagnosis not present

## 2015-09-03 ENCOUNTER — Encounter: Payer: Self-pay | Admitting: Family Medicine

## 2015-09-03 DIAGNOSIS — H7291 Unspecified perforation of tympanic membrane, right ear: Secondary | ICD-10-CM | POA: Insufficient documentation

## 2015-09-03 HISTORY — DX: Unspecified perforation of tympanic membrane, right ear: H72.91

## 2015-09-07 DIAGNOSIS — M5136 Other intervertebral disc degeneration, lumbar region: Secondary | ICD-10-CM | POA: Diagnosis not present

## 2015-09-07 DIAGNOSIS — M5134 Other intervertebral disc degeneration, thoracic region: Secondary | ICD-10-CM | POA: Diagnosis not present

## 2015-09-07 DIAGNOSIS — M546 Pain in thoracic spine: Secondary | ICD-10-CM | POA: Diagnosis not present

## 2015-09-07 DIAGNOSIS — M47816 Spondylosis without myelopathy or radiculopathy, lumbar region: Secondary | ICD-10-CM | POA: Diagnosis not present

## 2015-09-21 ENCOUNTER — Other Ambulatory Visit: Payer: Self-pay | Admitting: *Deleted

## 2015-09-21 MED ORDER — MONTELUKAST SODIUM 10 MG PO TABS: 10.0000 mg | ORAL_TABLET | Freq: Every day | ORAL | 0 refills | Status: DC

## 2015-10-04 DIAGNOSIS — M47816 Spondylosis without myelopathy or radiculopathy, lumbar region: Secondary | ICD-10-CM | POA: Diagnosis not present

## 2015-11-01 ENCOUNTER — Ambulatory Visit (INDEPENDENT_AMBULATORY_CARE_PROVIDER_SITE_OTHER): Payer: Medicare Other | Admitting: Psychiatry

## 2015-11-01 ENCOUNTER — Encounter (HOSPITAL_COMMUNITY): Payer: Self-pay | Admitting: Psychiatry

## 2015-11-01 VITALS — BP 127/85 | HR 98 | Ht 65.0 in | Wt 178.4 lb

## 2015-11-01 DIAGNOSIS — F341 Dysthymic disorder: Secondary | ICD-10-CM | POA: Diagnosis not present

## 2015-11-01 DIAGNOSIS — F29 Unspecified psychosis not due to a substance or known physiological condition: Secondary | ICD-10-CM

## 2015-11-01 MED ORDER — LAMOTRIGINE 25 MG PO TABS: 50.0000 mg | ORAL_TABLET | Freq: Two times a day (BID) | ORAL | 2 refills | Status: DC

## 2015-11-01 MED ORDER — CLONAZEPAM 1 MG PO TABS: 1.0000 mg | ORAL_TABLET | Freq: Every day | ORAL | 2 refills | Status: DC

## 2015-11-01 MED ORDER — RISPERIDONE 1 MG PO TABS: 1.0000 mg | ORAL_TABLET | Freq: Two times a day (BID) | ORAL | 2 refills | Status: DC

## 2015-11-01 MED ORDER — QUETIAPINE FUMARATE 25 MG PO TABS: 25.0000 mg | ORAL_TABLET | Freq: Two times a day (BID) | ORAL | 2 refills | Status: DC

## 2015-11-01 MED ORDER — VENLAFAXINE HCL 37.5 MG PO TABS: 37.5000 mg | ORAL_TABLET | Freq: Two times a day (BID) | ORAL | 2 refills | Status: DC

## 2015-11-01 NOTE — Patient Instructions (Signed)
Stop risperdal Start Seroquel 25 mg twice a day

## 2015-11-01 NOTE — Progress Notes (Signed)
Patient ID: Katenia Brotman, female   DOB: October 26, 1933, 82 y.o.   MRN: [id number] Patient ID: Letina Daughtry, female   DOB: 08/07/1933, 82 y.o.   MRN: [id number] Patient ID: Cotina Sjostrom, female   DOB: 11/16/1933, 82 y.o.   MRN: [id number] Patient ID: Rashelle Geschke, female   DOB: Jul 12, 1933, 82 y.o.   MRN: [id number] Patient ID: Sharlet Steinbrecher, female   DOB: September 18, 1933, 82 y.o.   MRN: [id number] Patient ID: Makila Maler, female   DOB: 16-Sep-1933, 82 y.o.   MRN: [id number] Patient ID: Kylen Tschida, female   DOB: 1933/06/17, 82 y.o.   MRN: [id number] Patient ID: Temya Breitenstein, female   DOB: May 21, 1933, 82 y.o.   MRN: [id number] Patient ID: Jarae Jerrett, female   DOB: 11/21/1933, 82 y.o.   MRN: [id number] Patient ID: Tsering Lampkin, female   DOB: 1934-02-07, 82 y.o.   MRN: [id number] Patient ID: Sheriece Stasik, female   DOB: 1934/02/03, 82 y.o.   MRN: [id number] Patient ID: Monicia Marroquin, female   DOB: 06/18/1933, 82 y.o.   MRN: [id number] [facility] [id number] Progress Note Terron Flesner MRN: [id number] DOB: 11-21-1933 Age: 82 y.o.  Date: 11/01/2015 Start Time: 10:20 AM End Time: 10:50 AM  Chief Complaint: Chief Complaint  Patient presents with  . Depression  . Anxiety  . Hallucinations  . Follow-up   Subjective: "The voices are gone most of the time"  This patient is a 82 year old married white female who lives with her husband in [place]. She has one daughter and 3 grandchildren. She is retired from the [employer].  Apparently the patient had some sort of psychotic episode in 2013. She was admitted to [facility] because she was hearing voices at home. The voices got so bad that she cannot put a gun under the bed and wanted to shoot him. The patient is a poor historian and is hard of hearing and gets easily confused. She was placed on various medicines in the hospital which have been continued. It's unclear if she is medication compliant. For example she's not  taking the Cogentin. She repeats herself a lot.  The patient states she still hearing voices every day. They're not frightening her like they were before but there seemed to her and she can't always get them to stop. She sleeping pretty well she denies being depressed but she is obviously not thinking clearly. She mentions that she's been diagnosed with parkinsonism but she doesn't have a tremor.  The patient returns after 3 months. She had called during the summer and stated that the voices were worse and I suggested she increase her Risperdal 2 mg twice a day. She got real shaky on this dose and is gone back to 1 mg twice a day. She's not hearing voices but hears music all the time. It's gospel music and it gets really loud in the afternoon and evenings and she's having some trouble sleeping. She denies being depressed or manic. She still enjoying taking care of her grandson. She recently had the flu but is getting over it. She's been on Risperdal for quite some time and I suggested we try something else etc. [place] and she agrees   History of Chief Complaint:   At age 63 pt went to a "fat farm" to fatten her up.  She remembers her father paid more attention to her than her mother did.  She experienced some anxiety when she [place].  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  She noted feeling very home sick to go back to [place] her home place in 2010.  Her sister had been very nervous and she was beginning to feel anxious and was started on Klonopin.  ISince then she noted songs for 3 or 4 years.  Then in October of 2013, she started hearing voices non stop and she lost 30 pounds. This prompted her to get a gun and put it under the bed and the family got concerned.  She was admitted to [facility] on January 24th.  She was stopped from the Klonopin and Lithium (pt doesn't  understand how she got on that) and placed on Ripserdal, Trazodone, Lamictal, and Effexor with pretty good results, except she has very dry mouth at nigh  She has noted blurred vision since starting the meds.  She is getting new glasses for herself.  Anxiety  Symptoms include confusion, decreased concentration and nervous/anxious behavior. Patient reports no dizziness or suicidal ideas.    Depression         Associated symptoms include decreased concentration.  Associated symptoms include no headaches and no suicidal ideas.  Past medical history includes anxiety.    Review of Systems  HENT: Negative.   Gastrointestinal: Negative.   Genitourinary: Negative.   Neurological: Positive for weakness and light-headedness. Negative for dizziness, tremors, seizures, syncope, facial asymmetry, speech difficulty, numbness and headaches.       Lightheaded if gets up too fast.  Psychiatric/Behavioral: Positive for confusion, decreased concentration, depression and dysphoric mood. Negative for agitation, behavioral problems, hallucinations, self-injury, sleep disturbance and suicidal ideas. The patient is nervous/anxious. The patient is not hyperactive.    Physical Exam  Depressive Symptoms: none now  (Hypo) Manic Symptoms:   None  Anxiety Symptoms: Excessive Worry:  Yes Panic Symptoms:  No Agoraphobia:  No Obsessive Compulsive: Yes  Symptoms: ordliness Specific Phobias:  Yes Social Anxiety:  No  Psychotic Symptoms:  None  PTSD Symptoms: Ever had a traumatic exposure:  Yes Had a traumatic exposure in the last month:  No No other features  Traumatic Brain Injury: No   Past Psychiatric History: Diagnosis: Depression  Hospitalizations: once [facility]  Outpatient Care: PCP  Substance Abuse Care: none  Self-Mutilation: none  Suicidal Attempts: none  Violent Behaviors: one episode of getting a gun, but now sees that that is not the way to handle her voices    Allergies: Allergies   Allergen Reactions  . Amoxicillin Hives    Sores in mouth  . Buprenorphine Hcl Itching    Can tolerate if necessary  . Morphine And Related Itching    Can tolerate if necessary  . Sulfonamide Derivatives Hives    Sores in mouth  . Valium [Diazepam] Itching    Can tolerate if necessary   Past Medical History:   Past Medical History:  Diagnosis Date  . Adenomatous polyp    x1  . ALLERGIC RHINITIS 12/20/2006  . Chronic anxiety   . Diverticulitis   . GERD (gastroesophageal reflux disease)    intermittent, treated with tums  . Hiatal hernia   . Hypothyroidism   . Insomnia   . Lumbago   . Osteoporosis 2007   declined prolia  . Perforation of right tympanic membrane 09/03/2015   Chronic, seen by Dr Janace Hoard ENT   . Personal history of kidney stones    x1  . Postmenopausal  atrophic vaginitis 2016   rec estrace cream - [place]  . Recurrent UTI   . Scoliosis   . Severe recurrent depression with psychosis ([place])   . Urinary retention   . Vitamin D deficiency    Surgical History: Past Surgical History:  Procedure Laterality Date  . ABDOMINAL HYSTERECTOMY  1979   ectopic pregnancy with one ovary removed  . APPENDECTOMY  1954  . CHOLECYSTECTOMY    . ESOPHAGOGASTRODUODENOSCOPY    . KNEE SURGERY Left 2012   L medial meniscal tear  . OOPHORECTOMY Right 1965   Ectopic pregnancy, removal of right ovary and tube- 1960  . ROTATOR CUFF REPAIR Right   . TONSILLECTOMY     History of Loss of Consciousness:  No Seizure History:  No Cardiac History:  No Allergies: Allergies  Allergen Reactions  . Amoxicillin Hives    Sores in mouth  . Buprenorphine Hcl Itching    Can tolerate if necessary  . Morphine And Related Itching    Can tolerate if necessary  . Sulfonamide Derivatives Hives    Sores in mouth  . Valium [Diazepam] Itching    Can tolerate if necessary   Medical History: Past Medical History:  Diagnosis Date  . Adenomatous polyp    x1  . ALLERGIC RHINITIS 12/20/2006   . Chronic anxiety   . Diverticulitis   . GERD (gastroesophageal reflux disease)    intermittent, treated with tums  . Hiatal hernia   . Hypothyroidism   . Insomnia   . Lumbago   . Osteoporosis 2007   declined prolia  . Perforation of right tympanic membrane 09/03/2015   Chronic, seen by Dr Janace Hoard ENT   . Personal history of kidney stones    x1  . Postmenopausal atrophic vaginitis 2016   rec estrace cream - [place]  . Recurrent UTI   . Scoliosis   . Severe recurrent depression with psychosis ([place])   . Urinary retention   . Vitamin D deficiency    Surgical History: Past Surgical History:  Procedure Laterality Date  . ABDOMINAL HYSTERECTOMY  1979   ectopic pregnancy with one ovary removed  . APPENDECTOMY  1954  . CHOLECYSTECTOMY    . ESOPHAGOGASTRODUODENOSCOPY    . KNEE SURGERY Left 2012   L medial meniscal tear  . OOPHORECTOMY Right 1965   Ectopic pregnancy, removal of right ovary and tube- 1960  . ROTATOR CUFF REPAIR Right   . TONSILLECTOMY     Family History: family history includes Anxiety disorder in her grandchild; Cancer in her sister and sister; Depression in her grandchild; Diabetes in her brother, father, mother, and sister; Healthy in her sister; Schizophrenia in her sister. Reviewed and no changes noted.  Current Medications:  Current Outpatient Prescriptions  Medication Sig Dispense Refill  . albuterol (PROVENTIL HFA;VENTOLIN HFA) 108 (90 Base) MCG/ACT inhaler Inhale 2 puffs into the lungs every 6 (six) hours as needed (cough). 3 Inhaler 3  . benztropine (COGENTIN) 0.5 MG tablet Take 1 tablet (0.5 mg total) by mouth 3 (three) times daily as needed for tremors. 180 tablet 2  . bisacodyl (DULCOLAX) 10 MG suppository Place 1 suppository (10 mg total) rectally daily as needed for moderate constipation. 12 suppository 0  . Calcium Carbonate-Vitamin D (CALCIUM-D PO) Take 1 tablet by mouth daily.    . clonazePAM (KLONOPIN) 1 MG tablet Take 1 tablet (1 mg total) by  mouth at bedtime. 90 tablet 2  . cyclobenzaprine (FLEXERIL) 10 MG tablet Take 10 mg by mouth 3 (  three) times daily as needed for muscle spasms.    Marland Kitchen dicyclomine (BENTYL) 10 MG capsule Take 10 mg by mouth daily as needed for spasms. For diverticulitis    . fluticasone (FLONASE) 50 MCG/ACT nasal spray Place 2 sprays into both nostrils daily as needed for allergies or rhinitis. 16 g 3  . hydrocortisone (ANUSOL-HC) 25 MG suppository Place 1 suppository (25 mg total) rectally 2 (two) times daily as needed for hemorrhoids or itching. 12 suppository 0  . lamoTRIgine (LAMICTAL) 25 MG tablet Take 2 tablets (50 mg total) by mouth 2 (two) times daily. 360 tablet 2  . levothyroxine (SYNTHROID, LEVOTHROID) 88 MCG tablet TAKE 1 TABLET EVERY MORNING (NEED OFFICE VISIT AFTER ONE REFILL) 30 tablet 0  . montelukast (SINGULAIR) 10 MG tablet Take 1 tablet (10 mg total) by mouth at bedtime. 90 tablet 0  . QUEtiapine (SEROQUEL) 25 MG tablet Take 1 tablet (25 mg total) by mouth 2 (two) times daily. 60 tablet 2  . QUEtiapine (SEROQUEL) 25 MG tablet Take 1 tablet (25 mg total) by mouth 2 (two) times daily. 180 tablet 2  . traMADol (ULTRAM) 50 MG tablet Take 50 mg by mouth every 6 (six) hours as needed for moderate pain.    Marland Kitchen venlafaxine (EFFEXOR) 37.5 MG tablet Take 1 tablet (37.5 mg total) by mouth 2 (two) times daily. 180 tablet 2  . Vitamin D, Ergocalciferol, (DRISDOL) 50000 UNITS CAPS capsule TAKE 1 CAPSULE EVERY 7 DAYS 12 capsule 2   No current facility-administered medications for this visit.     Previous Psychotropic Medications:  Medication Dose   Klonopin     Lithium ?    Risperdal    Lamictal    Effexor    Trazodone    Substance Abuse History in the last 12 months: Substance Age of 1st Use Last Use Amount Specific Type  Nicotine  none        Alcohol  20  March 13      Cannabis  none        Opiates  none        Cocaine  none        Methamphetamines  none        LSD  none        Ecstasy  none          Benzodiazepines  [age]      Caffeine  childhood  this AM      Inhalants  none        Others:       sugar  childhood  this AM     Medical Consequences of Substance Abuse: none  Legal Consequences of Substance Abuse: none  Family Consequences of Substance Abuse: none  Social History: Current Place of Residence: [address] [place] [address] Place of Birth: [place], [place] Family Members: husband Marital Status:  Married Children: 1  Sons: 0  Daughters: 1 Relationships: husbandf Education:  Dentist Problems/Performance: good Religious Beliefs/Practices: [employer] History of Abuse: none Occupational Experiences: clerical, office, Architect History:  Press photographer History: none Hobbies/Interests: walking, reading, playing cards  Mental Status Examination/Evaluation: Objective:  Appearance: Casual neatly dressed   Eye Contact::  Good  Speech: Clear and coherent   Volume:  Normal  Mood:  neutral  Affect:  Congruent  Thought Process fairly organized today   Thought content: Auditory hallucinations hearing gospel music     Suicidal Thoughts:  No  Homicidal Thoughts:  No  Judgement:  Good  Insight:  Good  Psychomotor Activity:  Normal  Akathisia:  No  Handed:  Right  AIMS (if indicated):    Assets:  Communication Skills Desire for Improvement   Lab Results:  Results for orders placed or performed in visit on 06/22/15 (from the past 8736 hour(s))  POCT Influenza A/B   Collection Time: 06/22/15  8:59 AM  Result Value Ref Range   Influenza A, POC Negative Negative   Influenza B, POC Negative Negative   Assessment:    AXIS I  major depression with psychotic features, rule out dementia   AXIS II Deferred  AXIS III Past Medical History:  Diagnosis Date  . Adenomatous polyp    x1  . ALLERGIC RHINITIS 12/20/2006  . Chronic anxiety   . Diverticulitis   . GERD (gastroesophageal reflux disease)    intermittent, treated  with tums  . Hiatal hernia   . Hypothyroidism   . Insomnia   . Lumbago   . Osteoporosis 2007   declined prolia  . Perforation of right tympanic membrane 09/03/2015   Chronic, seen by Dr Janace Hoard ENT   . Personal history of kidney stones    x1  . Postmenopausal atrophic vaginitis 2016   rec estrace cream - [place]  . Recurrent UTI   . Scoliosis   . Severe recurrent depression with psychosis ([place])   . Urinary retention   . Vitamin D deficiency      AXIS IV economic problems and other psychosocial or environmental problems  AXIS V 61-70 mild symptoms   Treatment Plan/Recommendations:  Laboratory:    Psychotherapy: supportive  Medications: Risperdal, Lamictal, Effexor  Routine PRN Medications:  No  Consultations: none  Safety Concerns:  none  Other:     Plan/Discussion: I took her vitals.  I reviewed CC, tobacco/med/surg Hx, meds effects/ side effects, problem list, therapies and responses as well as current situation/symptoms discussed options. She will discontinue Risperdal and start Seroquel 25 mg twice a day for the auditory hallucinations She will continue Effexor for depression and Lamictal for mood stabilization. She will continue clonazepam to 1 mg at bedtime to help with sleep She will call if hallucinations worsen She'll return in 4 weeks See orders and pt instructions for more details.  MEDICATIONS this encounter: Meds ordered this encounter  Medications  . DISCONTD: risperiDONE (RISPERDAL) 1 MG tablet    Sig: Take 1 tablet (1 mg total) by mouth 2 (two) times daily.    Dispense:  180 tablet    Refill:  2  . QUEtiapine (SEROQUEL) 25 MG tablet    Sig: Take 1 tablet (25 mg total) by mouth 2 (two) times daily.    Dispense:  60 tablet    Refill:  2  . QUEtiapine (SEROQUEL) 25 MG tablet    Sig: Take 1 tablet (25 mg total) by mouth 2 (two) times daily.    Dispense:  180 tablet    Refill:  2  . venlafaxine (EFFEXOR) 37.5 MG tablet    Sig: Take 1 tablet (37.5 mg total)  by mouth 2 (two) times daily.    Dispense:  180 tablet    Refill:  2  . lamoTRIgine (LAMICTAL) 25 MG tablet    Sig: Take 2 tablets (50 mg total) by mouth 2 (two) times daily.    Dispense:  360 tablet    Refill:  2  . clonazePAM (KLONOPIN) 1 MG tablet    Sig: Take 1 tablet (1 mg total)  by mouth at bedtime.    Dispense:  90 tablet    Refill:  2    Medical Decision Making Problem Points:  Established problem, stable/improving (1), New problem, with no additional work-up planned (3), Review of last therapy session (1) and Review of psycho-social stressors (1) Data Points:  Review or order clinical lab tests (1) Review of medication regiment & side effects (2) Review of new medications or change in dosage (2)  four-week's I certify that outpatient services furnished can reasonably be expected to improve the patient's condition.   Levonne Spiller, MD   Patient ID: Sanju Senko, female   DOB: Apr 11, 1933, 82 y.o.   MRN: [id number]

## 2015-11-02 DIAGNOSIS — M47816 Spondylosis without myelopathy or radiculopathy, lumbar region: Secondary | ICD-10-CM | POA: Diagnosis not present

## 2015-11-02 DIAGNOSIS — M546 Pain in thoracic spine: Secondary | ICD-10-CM | POA: Diagnosis not present

## 2015-11-16 DIAGNOSIS — M47816 Spondylosis without myelopathy or radiculopathy, lumbar region: Secondary | ICD-10-CM | POA: Diagnosis not present

## 2015-11-19 HISTORY — PX: CATARACT EXTRACTION, BILATERAL: SHX1313

## 2015-11-29 ENCOUNTER — Encounter (HOSPITAL_COMMUNITY): Payer: Self-pay | Admitting: Psychiatry

## 2015-11-29 ENCOUNTER — Ambulatory Visit (INDEPENDENT_AMBULATORY_CARE_PROVIDER_SITE_OTHER): Payer: Medicare Other | Admitting: Psychiatry

## 2015-11-29 VITALS — BP 117/79 | HR 81 | Ht 65.0 in | Wt 176.6 lb

## 2015-11-29 DIAGNOSIS — F333 Major depressive disorder, recurrent, severe with psychotic symptoms: Secondary | ICD-10-CM

## 2015-11-29 DIAGNOSIS — F29 Unspecified psychosis not due to a substance or known physiological condition: Secondary | ICD-10-CM

## 2015-11-29 DIAGNOSIS — Z818 Family history of other mental and behavioral disorders: Secondary | ICD-10-CM | POA: Diagnosis not present

## 2015-11-29 DIAGNOSIS — F341 Dysthymic disorder: Secondary | ICD-10-CM | POA: Diagnosis not present

## 2015-11-29 DIAGNOSIS — Z809 Family history of malignant neoplasm, unspecified: Secondary | ICD-10-CM

## 2015-11-29 DIAGNOSIS — Z833 Family history of diabetes mellitus: Secondary | ICD-10-CM

## 2015-11-29 NOTE — Progress Notes (Signed)
Patient ID: Emilyah Crawmer, female   DOB: 1933/11/25, 82 y.o.   MRN: [id number] Patient ID: Elpha Ryon, female   DOB: 1933/12/26, 82 y.o.   MRN: [id number] Patient ID: Teressa Baes, female   DOB: Apr 28, 1933, 82 y.o.   MRN: [id number] Patient ID: Keily Ertz, female   DOB: 11-20-1933, 82 y.o.   MRN: [id number] Patient ID: Sarin Verrett, female   DOB: 1933/06/02, 82 y.o.   MRN: [id number] Patient ID: Dakira Babayev, female   DOB: 02/25/1933, 82 y.o.   MRN: [id number] Patient ID: Kaye Blatnick, female   DOB: 1933-05-27, 82 y.o.   MRN: [id number] Patient ID: Jazlynn Luft, female   DOB: [birth year], 82 y.o.   MRN: [id number] Patient ID: Adriella Kinsler, female   DOB: 03-Nov-1933, 82 y.o.   MRN: [id number] Patient ID: Kamron Marklin, female   DOB: Jan 26, 1934, 82 y.o.   MRN: [id number] Patient ID: Camilla Hendrixson, female   DOB: 07/11/1933, 82 y.o.   MRN: [id number] Patient ID: Rina Brinser, female   DOB: December 10, 1933, 82 y.o.   MRN: [id number] [facility] [id number] Progress Note Madhuri Alphonse MRN: [id number] DOB: 1933/12/15 Age: 82 y.o.  Date: 11/29/2015 Start Time: 10:20 AM End Time: 10:50 AM  Chief Complaint: Chief Complaint  Patient presents with  . Depression  . Anxiety  . Hallucinations  . Follow-up   Subjective: "The voices are gone"  This patient is a 82 year old married white female who lives with her husband in [place]. She has one daughter and 3 grandchildren. She is retired from the [employer].  Apparently the patient had some sort of psychotic episode in 2013. She was admitted to [facility] because she was hearing voices at home. The voices got so bad that she cannot put a gun under the bed and wanted to shoot him. The patient is a poor historian and is hard of hearing and gets easily confused. She was placed on various medicines in the hospital which have been continued. It's unclear if she is medication compliant. For example she's not taking the  Cogentin. She repeats herself a lot.  The patient states she still hearing voices every day. They're not frightening her like they were before but there seemed to her and she can't always get them to stop. She sleeping pretty well she denies being depressed but she is obviously not thinking clearly. She mentions that she's been diagnosed with parkinsonism but she doesn't have a tremor.  The patient returns after 4 weeks. Last time she did not feel well on the Risperdal and was still hearing music and sometimes voices. I changed to Seroquel 25 mg twice a day. She states it's helped a lot and she is no longer hearing any voices or noises and she is feeling more relaxed and calm. Most of the time she only takes it once a day at lunch time. Her mood has been good and she is sleeping well. She recently had an injection in her back for arthritis which is helped her. She denies depression or suicidal ideation   History of Chief Complaint:   At age 82 pt went to a "fat farm" to fatten her up.  She remembers her father paid more attention to her than her mother did.  She experienced some anxiety when she moved [place].  She was in [employer] for 2 years, then she was the Glass blower/designer for [employer] and then got into [employer].  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  She noted feeling very  home sick to go back to [place] her home place in 2010.  Her sister had been very nervous and she was beginning to feel anxious and was started on Klonopin.  ISince then she noted songs for 3 or 4 years.  Then in October of 2013, she started hearing voices non stop and she lost 30 pounds. This prompted her to get a gun and put it under the bed and the family got concerned.  She was admitted to [facility] on January 24th.  She was stopped from the Klonopin and Lithium (pt doesn't understand how she got on that) and placed on Ripserdal, Trazodone, Lamictal, and Effexor with pretty good results, except she has very  dry mouth at nigh  She has noted blurred vision since starting the meds.  She is getting new glasses for herself.  Depression         Associated symptoms include decreased concentration.  Associated symptoms include no headaches and no suicidal ideas.  Past medical history includes anxiety.   Anxiety  Symptoms include confusion, decreased concentration and nervous/anxious behavior. Patient reports no dizziness or suicidal ideas.     Review of Systems  HENT: Negative.   Gastrointestinal: Negative.   Genitourinary: Negative.   Neurological: Positive for weakness and light-headedness. Negative for dizziness, tremors, seizures, syncope, facial asymmetry, speech difficulty, numbness and headaches.       Lightheaded if gets up too fast.  Psychiatric/Behavioral: Positive for confusion, decreased concentration, depression and dysphoric mood. Negative for agitation, behavioral problems, hallucinations, self-injury, sleep disturbance and suicidal ideas. The patient is nervous/anxious. The patient is not hyperactive.    Physical Exam  Depressive Symptoms: none now  (Hypo) Manic Symptoms:   None  Anxiety Symptoms: Excessive Worry:  Yes Panic Symptoms:  No Agoraphobia:  No Obsessive Compulsive: Yes  Symptoms: ordliness Specific Phobias:  Yes Social Anxiety:  No  Psychotic Symptoms:  None  PTSD Symptoms: Ever had a traumatic exposure:  Yes Had a traumatic exposure in the last month:  No No other features  Traumatic Brain Injury: No   Past Psychiatric History: Diagnosis: Depression  Hospitalizations: once [facility]  Outpatient Care: PCP  Substance Abuse Care: none  Self-Mutilation: none  Suicidal Attempts: none  Violent Behaviors: one episode of getting a gun, but now sees that that is not the way to handle her voices    Allergies: Allergies  Allergen Reactions  . Amoxicillin Hives    Sores in mouth  . Buprenorphine Hcl Itching    Can tolerate if necessary  . Morphine And  Related Itching    Can tolerate if necessary  . Sulfonamide Derivatives Hives    Sores in mouth  . Valium [Diazepam] Itching    Can tolerate if necessary   Past Medical History:   Past Medical History:  Diagnosis Date  . Adenomatous polyp    x1  . ALLERGIC RHINITIS 12/20/2006  . Chronic anxiety   . Diverticulitis   . GERD (gastroesophageal reflux disease)    intermittent, treated with tums  . Hiatal hernia   . Hypothyroidism   . Insomnia   . Lumbago   . Osteoporosis 2007   declined prolia  . Perforation of right tympanic membrane 09/03/2015   Chronic, seen by Dr Janace Hoard ENT   . Personal history of kidney stones    x1  . Postmenopausal atrophic vaginitis 2016   rec estrace cream - [place]  . Recurrent UTI   . Scoliosis   . Severe recurrent depression  with psychosis ([place])   . Urinary retention   . Vitamin D deficiency    Surgical History: Past Surgical History:  Procedure Laterality Date  . ABDOMINAL HYSTERECTOMY  1979   ectopic pregnancy with one ovary removed  . APPENDECTOMY  1954  . CHOLECYSTECTOMY    . ESOPHAGOGASTRODUODENOSCOPY    . KNEE SURGERY Left 2012   L medial meniscal tear  . OOPHORECTOMY Right 1965   Ectopic pregnancy, removal of right ovary and tube- 1960  . ROTATOR CUFF REPAIR Right   . TONSILLECTOMY     History of Loss of Consciousness:  No Seizure History:  No Cardiac History:  No Allergies: Allergies  Allergen Reactions  . Amoxicillin Hives    Sores in mouth  . Buprenorphine Hcl Itching    Can tolerate if necessary  . Morphine And Related Itching    Can tolerate if necessary  . Sulfonamide Derivatives Hives    Sores in mouth  . Valium [Diazepam] Itching    Can tolerate if necessary   Medical History: Past Medical History:  Diagnosis Date  . Adenomatous polyp    x1  . ALLERGIC RHINITIS 12/20/2006  . Chronic anxiety   . Diverticulitis   . GERD (gastroesophageal reflux disease)    intermittent, treated with tums  . Hiatal hernia    . Hypothyroidism   . Insomnia   . Lumbago   . Osteoporosis 2007   declined prolia  . Perforation of right tympanic membrane 09/03/2015   Chronic, seen by Dr Janace Hoard ENT   . Personal history of kidney stones    x1  . Postmenopausal atrophic vaginitis 2016   rec estrace cream - [place]  . Recurrent UTI   . Scoliosis   . Severe recurrent depression with psychosis ([place])   . Urinary retention   . Vitamin D deficiency    Surgical History: Past Surgical History:  Procedure Laterality Date  . ABDOMINAL HYSTERECTOMY  1979   ectopic pregnancy with one ovary removed  . APPENDECTOMY  1954  . CHOLECYSTECTOMY    . ESOPHAGOGASTRODUODENOSCOPY    . KNEE SURGERY Left 2012   L medial meniscal tear  . OOPHORECTOMY Right 1965   Ectopic pregnancy, removal of right ovary and tube- 1960  . ROTATOR CUFF REPAIR Right   . TONSILLECTOMY     Family History: family history includes Anxiety disorder in her grandchild; Cancer in her sister and sister; Depression in her grandchild; Diabetes in her brother, father, mother, and sister; Healthy in her sister; Schizophrenia in her sister. Reviewed and no changes noted.  Current Medications:  Current Outpatient Prescriptions  Medication Sig Dispense Refill  . albuterol (PROVENTIL HFA;VENTOLIN HFA) 108 (90 Base) MCG/ACT inhaler Inhale 2 puffs into the lungs every 6 (six) hours as needed (cough). 3 Inhaler 3  . benztropine (COGENTIN) 0.5 MG tablet Take 1 tablet (0.5 mg total) by mouth 3 (three) times daily as needed for tremors. 180 tablet 2  . bisacodyl (DULCOLAX) 10 MG suppository Place 1 suppository (10 mg total) rectally daily as needed for moderate constipation. 12 suppository 0  . Calcium Carbonate-Vitamin D (CALCIUM-D PO) Take 1 tablet by mouth daily.    . clonazePAM (KLONOPIN) 1 MG tablet Take 1 tablet (1 mg total) by mouth at bedtime. 90 tablet 2  . cyclobenzaprine (FLEXERIL) 10 MG tablet Take 10 mg by mouth 3 (three) times daily as needed for muscle  spasms.    Marland Kitchen dicyclomine (BENTYL) 10 MG capsule Take 10 mg by mouth daily as  needed for spasms. For diverticulitis    . fluticasone (FLONASE) 50 MCG/ACT nasal spray Place 2 sprays into both nostrils daily as needed for allergies or rhinitis. 16 g 3  . hydrocortisone (ANUSOL-HC) 25 MG suppository Place 1 suppository (25 mg total) rectally 2 (two) times daily as needed for hemorrhoids or itching. 12 suppository 0  . lamoTRIgine (LAMICTAL) 25 MG tablet Take 2 tablets (50 mg total) by mouth 2 (two) times daily. 360 tablet 2  . levothyroxine (SYNTHROID, LEVOTHROID) 88 MCG tablet TAKE 1 TABLET EVERY MORNING (NEED OFFICE VISIT AFTER ONE REFILL) 30 tablet 0  . montelukast (SINGULAIR) 10 MG tablet Take 1 tablet (10 mg total) by mouth at bedtime. 90 tablet 0  . QUEtiapine (SEROQUEL) 25 MG tablet Take 1 tablet (25 mg total) by mouth 2 (two) times daily. 60 tablet 2  . QUEtiapine (SEROQUEL) 25 MG tablet Take 1 tablet (25 mg total) by mouth 2 (two) times daily. 180 tablet 2  . traMADol (ULTRAM) 50 MG tablet Take 50 mg by mouth every 6 (six) hours as needed for moderate pain.    Marland Kitchen venlafaxine (EFFEXOR) 37.5 MG tablet Take 1 tablet (37.5 mg total) by mouth 2 (two) times daily. 180 tablet 2  . Vitamin D, Ergocalciferol, (DRISDOL) 50000 UNITS CAPS capsule TAKE 1 CAPSULE EVERY 7 DAYS 12 capsule 2   No current facility-administered medications for this visit.     Previous Psychotropic Medications:  Medication Dose   Klonopin     Lithium ?    Risperdal    Lamictal    Effexor    Trazodone    Substance Abuse History in the last 12 months: Substance Age of 1st Use Last Use Amount Specific Type  Nicotine  none        Alcohol  20  March 13      Cannabis  none        Opiates  none        Cocaine  none        Methamphetamines  none        LSD  none        Ecstasy  none         Benzodiazepines  [age]      Caffeine  childhood  this AM      Inhalants  none        Others:       sugar  childhood  this AM      Medical Consequences of Substance Abuse: none  Legal Consequences of Substance Abuse: none  Family Consequences of Substance Abuse: none  Social History: Current Place of Residence: [address] [place] [address] Place of Birth: [place], [place] Family Members: husband Marital Status:  Married Children: 1  Sons: 0  Daughters: 1 Relationships: husbandf Education:  Dentist Problems/Performance: good Religious Beliefs/Practices: [employer] History of Abuse: none Occupational Experiences: clerical, office, Architect History:  Press photographer History: none Hobbies/Interests: walking, reading, playing cards  Mental Status Examination/Evaluation: Objective:  Appearance: Casual neatly dressed   Eye Contact::  Good  Speech: Clear and coherent   Volume:  Normal  Mood:Good   Affect:  Congruent  Thought Process fairly organized today   Thought content:Normal, no longer having auditory hallucinations     Suicidal Thoughts:  No  Homicidal Thoughts:  No  Judgement:  Good  Insight:  Good  Psychomotor Activity:  Normal  Akathisia:  No  Handed:  Right  AIMS (  if indicated):    Assets:  Communication Skills Desire for Improvement   Lab Results:  Results for orders placed or performed in visit on 06/22/15 (from the past 8736 hour(s))  POCT Influenza A/B   Collection Time: 06/22/15  8:59 AM  Result Value Ref Range   Influenza A, POC Negative Negative   Influenza B, POC Negative Negative   Assessment:    AXIS I  major depression with psychotic features, rule out dementia   AXIS II Deferred  AXIS III Past Medical History:  Diagnosis Date  . Adenomatous polyp    x1  . ALLERGIC RHINITIS 12/20/2006  . Chronic anxiety   . Diverticulitis   . GERD (gastroesophageal reflux disease)    intermittent, treated with tums  . Hiatal hernia   . Hypothyroidism   . Insomnia   . Lumbago   . Osteoporosis 2007   declined prolia  . Perforation of  right tympanic membrane 09/03/2015   Chronic, seen by Dr Janace Hoard ENT   . Personal history of kidney stones    x1  . Postmenopausal atrophic vaginitis 2016   rec estrace cream - [place]  . Recurrent UTI   . Scoliosis   . Severe recurrent depression with psychosis ([place])   . Urinary retention   . Vitamin D deficiency      AXIS IV economic problems and other psychosocial or environmental problems  AXIS V 61-70 mild symptoms   Treatment Plan/Recommendations:  Laboratory:    Psychotherapy: supportive  Medications: Sarah quell, Lamictal, Effexor  Routine PRN Medications:  No  Consultations: none  Safety Concerns:  none  Other:     Plan/Discussion: I took her vitals.  I reviewed CC, tobacco/med/surg Hx, meds effects/ side effects, problem list, therapies and responses as well as current situation/symptoms discussed options. She will continue Seroquel 25 mg twice a day for the auditory hallucinations She will continue Effexor for depression and Lamictal for mood stabilization. She will continue clonazepam to 1 mg at bedtime to help with sleep She will call if hallucinations worsen She'll return in 3 months See orders and pt instructions for more details.  MEDICATIONS this encounter: No orders of the defined types were placed in this encounter.   Medical Decision Making Problem Points:  Established problem, stable/improving (1), New problem, with no additional work-up planned (3), Review of last therapy session (1) and Review of psycho-social stressors (1) Data Points:  Review or order clinical lab tests (1) Review of medication regiment & side effects (2) Review of new medications or change in dosage (2)  four-week's I certify that outpatient services furnished can reasonably be expected to improve the patient's condition.   Levonne Spiller, MD   Patient ID: Laiklynn Tula, female   DOB: 06/25/1933, 82 y.o.   MRN: [id number]

## 2015-12-19 ENCOUNTER — Other Ambulatory Visit: Payer: Self-pay | Admitting: Family Medicine

## 2015-12-20 DIAGNOSIS — H66002 Acute suppurative otitis media without spontaneous rupture of ear drum, left ear: Secondary | ICD-10-CM | POA: Diagnosis not present

## 2015-12-20 DIAGNOSIS — H722X1 Other marginal perforations of tympanic membrane, right ear: Secondary | ICD-10-CM | POA: Diagnosis not present

## 2015-12-23 ENCOUNTER — Other Ambulatory Visit: Payer: Self-pay | Admitting: Family Medicine

## 2015-12-26 ENCOUNTER — Ambulatory Visit (INDEPENDENT_AMBULATORY_CARE_PROVIDER_SITE_OTHER): Payer: Medicare Other | Admitting: Endocrinology

## 2015-12-26 ENCOUNTER — Other Ambulatory Visit: Payer: Self-pay

## 2015-12-26 ENCOUNTER — Encounter: Payer: Self-pay | Admitting: Endocrinology

## 2015-12-26 ENCOUNTER — Other Ambulatory Visit (INDEPENDENT_AMBULATORY_CARE_PROVIDER_SITE_OTHER): Payer: Medicare Other

## 2015-12-26 VITALS — BP 138/84 | HR 98 | Ht 65.0 in | Wt 176.0 lb

## 2015-12-26 DIAGNOSIS — E038 Other specified hypothyroidism: Secondary | ICD-10-CM | POA: Diagnosis not present

## 2015-12-26 DIAGNOSIS — E559 Vitamin D deficiency, unspecified: Secondary | ICD-10-CM | POA: Diagnosis not present

## 2015-12-26 DIAGNOSIS — Z23 Encounter for immunization: Secondary | ICD-10-CM

## 2015-12-26 DIAGNOSIS — E063 Autoimmune thyroiditis: Secondary | ICD-10-CM

## 2015-12-26 LAB — T4, FREE: FREE T4: 1.1 ng/dL (ref 0.60–1.60)

## 2015-12-26 LAB — VITAMIN D 25 HYDROXY (VIT D DEFICIENCY, FRACTURES): VITD: 35.7 ng/mL (ref 30.00–100.00)

## 2015-12-26 LAB — TSH: TSH: 2.7 u[IU]/mL (ref 0.35–4.50)

## 2015-12-26 MED ORDER — LEVOTHYROXINE SODIUM 88 MCG PO TABS: ORAL_TABLET | ORAL | 3 refills | Status: DC

## 2015-12-26 NOTE — Progress Notes (Signed)
Please let patient know that the thyroid levels and vitamin D are normal, continue same regimen and follow-up in one year

## 2015-12-26 NOTE — Progress Notes (Signed)
Patient ID: Jamie Pitts, female   DOB: 1933/12/07, 82 y.o.   MRN: [id number]    Reason for Appointment:  followup visit    History of Present Illness:   HYPOTHYROIDISM: This was first diagnosed in 1980 This was diagnosed when she had symptoms of fatigue and hair loss No history of goiter  She has been on generic Synthroid and has been on 88 mcg daily for quite sometime.   She was changed to the generic preparation by her [place] even though she was on brand-name previously          Compliance with the medical regimen has been as prescribed with taking the tablet in the morning before breakfast. Recently she has no unusual fatigue. She does not  have cold sensitivity This year her weight has been about the same           Wt Readings from Last 3 Encounters:  12/26/15 176 lb (79.8 kg)  06/22/15 181 lb 8 oz (82.3 kg)  03/12/15 177 lb 6 oz (80.5 kg)   Labs from today are pending  Lab Results  Component Value Date   FREET4 1.02 07/28/2014   FREET4 1.26 11/10/2013   FREET4 1.29 10/14/2013   TSH 0.65 07/28/2014   TSH 0.481 05/08/2014   TSH 0.49 11/10/2013    OSTEOPOROSIS:  She has had asymptomatic osteoporosis, diagnosed probably in 1997  At that time she was treated with bisphosphonates, probably Fosamax for about 7 years  Was taking Fosamax in 2006 and Actonel in 2007, not clear how long she continued Actonel  She had a T score of -2.8 in 2007 She  had another bone density in 7/15 and the lowest T score is -1.9 at the spine, normal at the hip  She also has had vitamin D deficiency and has been Taking vitamin D by prescription since at least 2013 Initially she was irregular with this because of reported side effects of constipation but more recently she is taking this regularly More recently has been taking this every other week  Lab Results  Component Value Date   VD25OH 35.70 12/26/2015   VD25OH 54.60 10/13/2013       Medication List       Accurate  as of 12/26/15  1:04 PM. Always use your most recent med list.          benztropine 0.5 MG tablet Commonly known as:  COGENTIN Take 1 tablet (0.5 mg total) by mouth 3 (three) times daily as needed for tremors.   bisacodyl 10 MG suppository Commonly known as:  DULCOLAX Place 1 suppository (10 mg total) rectally daily as needed for moderate constipation.   CALCIUM-D PO Take 1 tablet by mouth daily.   clonazePAM 1 MG tablet Commonly known as:  KLONOPIN Take 1 tablet (1 mg total) by mouth at bedtime.   cyclobenzaprine 10 MG tablet Commonly known as:  FLEXERIL Take 10 mg by mouth 3 (three) times daily as needed for muscle spasms.   dicyclomine 10 MG capsule Commonly known as:  BENTYL Take 10 mg by mouth daily as needed for spasms. For diverticulitis   fluticasone 50 MCG/ACT nasal spray Commonly known as:  FLONASE Place 2 sprays into both nostrils daily as needed for allergies or rhinitis.   hydrocortisone 25 MG suppository Commonly known as:  ANUSOL-HC Place 1 suppository (25 mg total) rectally 2 (two) times daily as needed for hemorrhoids or itching.   lamoTRIgine 25 MG tablet Commonly known as:  LAMICTAL Take  2 tablets (50 mg total) by mouth 2 (two) times daily.   levothyroxine 88 MCG tablet Commonly known as:  SYNTHROID, LEVOTHROID TAKE 1 TABLET EVERY MORNING (NEED OFFICE VISIT AFTER ONE REFILL)   montelukast 10 MG tablet Commonly known as:  SINGULAIR TAKE 1 TABLET AT BEDTIME   PROAIR HFA 108 (90 Base) MCG/ACT inhaler Generic drug:  albuterol USE 2 INHALATIONS EVERY 6 HOURS AS NEEDED FOR COUGH   QUEtiapine 25 MG tablet Commonly known as:  SEROQUEL Take 1 tablet (25 mg total) by mouth 2 (two) times daily.   QUEtiapine 25 MG tablet Commonly known as:  SEROQUEL Take 1 tablet (25 mg total) by mouth 2 (two) times daily.   traMADol 50 MG tablet Commonly known as:  ULTRAM Take 50 mg by mouth every 6 (six) hours as needed for moderate pain.   venlafaxine 37.5 MG  tablet Commonly known as:  EFFEXOR Take 1 tablet (37.5 mg total) by mouth 2 (two) times daily.   Vitamin D (Ergocalciferol) 50000 units Caps capsule Commonly known as:  DRISDOL TAKE 1 CAPSULE EVERY 7 DAYS       Allergies:  Allergies  Allergen Reactions  . Amoxicillin Hives    Sores in mouth  . Buprenorphine Hcl Itching    Can tolerate if necessary  . Morphine And Related Itching    Can tolerate if necessary  . Sulfonamide Derivatives Hives    Sores in mouth  . Valium [Diazepam] Itching    Can tolerate if necessary    Past Medical History:  Diagnosis Date  . Adenomatous polyp    x1  . ALLERGIC RHINITIS 12/20/2006  . Chronic anxiety   . Diverticulitis   . GERD (gastroesophageal reflux disease)    intermittent, treated with tums  . Hiatal hernia   . Hypothyroidism   . Insomnia   . Lumbago   . Osteoporosis 2007   declined prolia  . Perforation of right tympanic membrane 09/03/2015   Chronic, seen by Dr Janace Hoard ENT   . Personal history of kidney stones    x1  . Postmenopausal atrophic vaginitis 2016   rec estrace cream - [place]  . Recurrent UTI   . Scoliosis   . Severe recurrent depression with psychosis ([place])   . Urinary retention   . Vitamin D deficiency     Past Surgical History:  Procedure Laterality Date  . ABDOMINAL HYSTERECTOMY  1979   ectopic pregnancy with one ovary removed  . APPENDECTOMY  1954  . CHOLECYSTECTOMY    . ESOPHAGOGASTRODUODENOSCOPY    . KNEE SURGERY Left 2012   L medial meniscal tear  . OOPHORECTOMY Right 1965   Ectopic pregnancy, removal of right ovary and tube- 1960  . ROTATOR CUFF REPAIR Right   . TONSILLECTOMY      Family History  Problem Relation Age of Onset  . Schizophrenia Sister   . Alcohol abuse Neg Hx   . Bipolar disorder Neg Hx   . Dementia Neg Hx   . Drug abuse Neg Hx   . OCD Neg Hx   . Paranoid behavior Neg Hx   . Seizures Neg Hx   . Sexual abuse Neg Hx   . Physical abuse Neg Hx   . Anxiety disorder  Grandchild   . Depression Grandchild   . Cancer Sister     lung  . Healthy Sister   . Diabetes Mother   . Diabetes Father   . Diabetes Sister   . Diabetes Brother   .  Cancer Sister     kidney    Social History:  reports that she has never smoked. She has never used smokeless tobacco. She reports that she does not drink alcohol or use drugs.  REVIEW Of SYSTEMS:  She has had chronic depression  She tends to gain weight    Wt Readings from Last 3 Encounters:  12/26/15 176 lb (79.8 kg)  06/22/15 181 lb 8 oz (82.3 kg)  03/12/15 177 lb 6 oz (80.5 kg)     Examination:   BP 138/84   Pulse 98   Ht 5\' 5"  (1.651 m)   Wt 176 lb (79.8 kg)   LMP 06/18/1977   SpO2 94%   BMI 29.29 kg/m   She looks well.  No puffiness of eyes or face Biceps reflexes appear normal     Assessments/Treatment:  Hypothyroidism, primary, long-standing and without goiter Subjectively she is doing well and is compliant with her medication daily She is taking 88 g of her generic preparation, relatively low dose for her weight  Will need to check her thyroid levels today again  OSTEOPOROSIS:   This has improved as of 2015 and most likely has benefited from several years therapy of Fosamax and Actonel previously Again she does not need another round of treatment unless her bone density is getting low.    VITAMIN D deficiency: She ha will have vitamin D level checked again Meanwhile continue 50,000 units every other week  She can continue calcium daily  She can have bone density scheduled by PCP at her next annual visit  Influenza vaccine given  [facility] 12/26/2015, 1:04 PM

## 2016-01-10 ENCOUNTER — Telehealth: Payer: Self-pay | Admitting: Family Medicine

## 2016-01-10 NOTE — Telephone Encounter (Signed)
LVM for pt to call back and schedule AWV + labs with Lesia and OV 30 with PCP. °

## 2016-01-12 ENCOUNTER — Other Ambulatory Visit (HOSPITAL_COMMUNITY): Payer: Self-pay | Admitting: Psychiatry

## 2016-01-18 DIAGNOSIS — M47816 Spondylosis without myelopathy or radiculopathy, lumbar region: Secondary | ICD-10-CM | POA: Diagnosis not present

## 2016-01-18 DIAGNOSIS — M5136 Other intervertebral disc degeneration, lumbar region: Secondary | ICD-10-CM | POA: Diagnosis not present

## 2016-01-18 DIAGNOSIS — M546 Pain in thoracic spine: Secondary | ICD-10-CM | POA: Diagnosis not present

## 2016-02-27 DIAGNOSIS — M47816 Spondylosis without myelopathy or radiculopathy, lumbar region: Secondary | ICD-10-CM | POA: Diagnosis not present

## 2016-03-01 ENCOUNTER — Ambulatory Visit (INDEPENDENT_AMBULATORY_CARE_PROVIDER_SITE_OTHER): Payer: Medicare Other | Admitting: Psychiatry

## 2016-03-01 ENCOUNTER — Encounter (HOSPITAL_COMMUNITY): Payer: Self-pay | Admitting: Psychiatry

## 2016-03-01 VITALS — BP 110/63 | HR 88 | Ht 65.0 in | Wt 172.4 lb

## 2016-03-01 DIAGNOSIS — Z79899 Other long term (current) drug therapy: Secondary | ICD-10-CM

## 2016-03-01 DIAGNOSIS — Z882 Allergy status to sulfonamides status: Secondary | ICD-10-CM | POA: Diagnosis not present

## 2016-03-01 DIAGNOSIS — F341 Dysthymic disorder: Secondary | ICD-10-CM

## 2016-03-01 DIAGNOSIS — F29 Unspecified psychosis not due to a substance or known physiological condition: Secondary | ICD-10-CM

## 2016-03-01 DIAGNOSIS — Z888 Allergy status to other drugs, medicaments and biological substances status: Secondary | ICD-10-CM | POA: Diagnosis not present

## 2016-03-01 DIAGNOSIS — Z9889 Other specified postprocedural states: Secondary | ICD-10-CM

## 2016-03-01 MED ORDER — LAMOTRIGINE 25 MG PO TABS: 50.0000 mg | ORAL_TABLET | Freq: Two times a day (BID) | ORAL | 2 refills | Status: DC

## 2016-03-01 MED ORDER — OLANZAPINE 2.5 MG PO TABS: ORAL_TABLET | ORAL | 2 refills | Status: DC

## 2016-03-01 MED ORDER — VENLAFAXINE HCL 37.5 MG PO TABS: 37.5000 mg | ORAL_TABLET | Freq: Two times a day (BID) | ORAL | 2 refills | Status: DC

## 2016-03-01 MED ORDER — CLONAZEPAM 1 MG PO TABS: 1.0000 mg | ORAL_TABLET | Freq: Every day | ORAL | 2 refills | Status: DC

## 2016-03-01 NOTE — Patient Instructions (Signed)
Stop Seroquel, start olanzapine one in the am and two at night

## 2016-03-01 NOTE — Progress Notes (Signed)
Patient ID: Jamie Pitts, female   DOB: [birth year], 83 y.o.   MRN: [id number] Patient ID: Jamie Pitts, female   DOB: January 22, 1934, 83 y.o.   MRN: [id number] Patient ID: Jamie Pitts, female   DOB: 1933-12-01, 83 y.o.   MRN: [id number] Patient ID: Jamie Pitts, female   DOB: June 11, 1933, 83 y.o.   MRN: [id number] Patient ID: Jamie Pitts, female   DOB: June 07, 1933, 83 y.o.   MRN: [id number] Patient ID: Jamie Pitts, female   DOB: 1933/04/24, 83 y.o.   MRN: [id number] Patient ID: Jamie Pitts, female   DOB: Jul 14, 1933, 83 y.o.   MRN: [id number] Patient ID: Jamie Pitts, female   DOB: 1933-12-04, 83 y.o.   MRN: [id number] Patient ID: Jamie Pitts, female   DOB: 08-Jan-1934, 83 y.o.   MRN: [id number] Patient ID: Jamie Pitts, female   DOB: [birth year], 83 y.o.   MRN: [id number] Patient ID: Jamie Pitts, female   DOB: 11/06/1933, 83 y.o.   MRN: [id number] Patient ID: Jamie Pitts, female   DOB: [birth year], 83 y.o.   MRN: [id number] [facility] [id number] Progress Note Jamie Pitts MRN: [id number] DOB: 1934-02-02 Age: 83 y.o.  Date: 03/01/2016 Start Time: 10:20 AM End Time: 10:50 AM  Chief Complaint: Chief Complaint  Patient presents with  . Depression  . Hallucinations  . Anxiety   Subjective: "The voices are gone"  This patient is a 83 year old married white female who lives with her husband in [place]. She has one daughter and 3 grandchildren. She is retired from the [employer].  Apparently the patient had some sort of psychotic episode in 2013. She was admitted to [facility] because she was hearing voices at home. The voices got so bad that she cannot put a gun under the bed and wanted to shoot him. The patient is a poor historian and is hard of hearing and gets easily confused. She was placed on various medicines in the hospital which have been continued. It's unclear if she is medication compliant. For example she's not taking the Cogentin. She repeats  herself a lot.  The patient states she still hearing voices every day. They're not frightening her like they were before but there seemed to her and she can't always get them to stop. She sleeping pretty well she denies being depressed but she is obviously not thinking clearly. She mentions that she's been diagnosed with parkinsonism but she doesn't have a tremor.  The patient returns after 3 months. She states that she had to stop the Seroquel a couple of weeks ago because it was causing urinary retention. Her urination is returned to normal now. However she's back to hearing music and also hears voices of a man and woman telling her to clean her house or turn off her TV etc. The Risperdal had stopped working for her so I suggested we try a low dose of olanzapine and she is agreeable. She denies being depressed or suicidal or significantly anxious   History of Chief Complaint:   At age 83 pt went to a "fat farm" to fatten her up.  She remembers her father paid more attention to her than her mother did.  She experienced some anxiety when she moved [place].  She was in [employer] for 2 years, then she was the Glass blower/designer for [employer] and then got into [employer].  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  She noted feeling very home sick to go back to [place] her home place in 2010.  Her sister had been very nervous and she was beginning to feel anxious and was started on Klonopin.  ISince then she noted songs for 3 or 4 years.  Then in October of 2013, she started hearing voices non stop and she lost 30 pounds. This prompted her to get a gun and put it under the bed and the family got concerned.  She was admitted to [facility] on January 24th.  She was stopped from the Klonopin and Lithium (pt doesn't understand how she got on that) and placed on Ripserdal, Trazodone, Lamictal, and Effexor with pretty good results, except she has very dry mouth at nigh  She has noted blurred vision since  starting the meds.  She is getting new glasses for herself.  Depression         Associated symptoms include decreased concentration.  Associated symptoms include no headaches and no suicidal ideas.  Past medical history includes anxiety.   Anxiety  Symptoms include confusion, decreased concentration and nervous/anxious behavior. Patient reports no dizziness or suicidal ideas.     Review of Systems  HENT: Negative.   Gastrointestinal: Negative.   Genitourinary: Negative.   Neurological: Positive for weakness and light-headedness. Negative for dizziness, tremors, seizures, syncope, facial asymmetry, speech difficulty, numbness and headaches.       Lightheaded if gets up too fast.  Psychiatric/Behavioral: Positive for confusion, decreased concentration, depression and dysphoric mood. Negative for agitation, behavioral problems, hallucinations, self-injury, sleep disturbance and suicidal ideas. The patient is nervous/anxious. The patient is not hyperactive.    Physical Exam  Depressive Symptoms: none now  (Hypo) Manic Symptoms:   None  Anxiety Symptoms: Excessive Worry:  Yes Panic Symptoms:  No Agoraphobia:  No Obsessive Compulsive: Yes  Symptoms: ordliness Specific Phobias:  Yes Social Anxiety:  No  Psychotic Symptoms:  None  PTSD Symptoms: Ever had a traumatic exposure:  Yes Had a traumatic exposure in the last month:  No No other features  Traumatic Brain Injury: No   Past Psychiatric History: Diagnosis: Depression  Hospitalizations: once [facility]  Outpatient Care: PCP  Substance Abuse Care: none  Self-Mutilation: none  Suicidal Attempts: none  Violent Behaviors: one episode of getting a gun, but now sees that that is not the way to handle her voices    Allergies: Allergies  Allergen Reactions  . Amoxicillin Hives    Sores in mouth  . Buprenorphine Hcl Itching    Can tolerate if necessary  . Morphine And Related Itching    Can tolerate if necessary  .  Sulfonamide Derivatives Hives    Sores in mouth  . Valium [Diazepam] Itching    Can tolerate if necessary   Past Medical History:   Past Medical History:  Diagnosis Date  . Adenomatous polyp    x1  . ALLERGIC RHINITIS 12/20/2006  . Chronic anxiety   . Diverticulitis   . GERD (gastroesophageal reflux disease)    intermittent, treated with tums  . Hiatal hernia   . Hypothyroidism   . Insomnia   . Lumbago   . Osteoporosis 2007   declined prolia  . Perforation of right tympanic membrane 09/03/2015   Chronic, seen by Dr Janace Hoard ENT   . Personal history of kidney stones    x1  . Postmenopausal atrophic vaginitis 2016   rec estrace cream - [place]  . Recurrent UTI   . Scoliosis   . Severe recurrent depression with psychosis ([place])   . Urinary retention   . Vitamin D deficiency  Surgical History: Past Surgical History:  Procedure Laterality Date  . ABDOMINAL HYSTERECTOMY  1979   ectopic pregnancy with one ovary removed  . APPENDECTOMY  1954  . CHOLECYSTECTOMY    . ESOPHAGOGASTRODUODENOSCOPY    . KNEE SURGERY Left 2012   L medial meniscal tear  . OOPHORECTOMY Right 1965   Ectopic pregnancy, removal of right ovary and tube- 1960  . ROTATOR CUFF REPAIR Right   . TONSILLECTOMY     History of Loss of Consciousness:  No Seizure History:  No Cardiac History:  No Allergies: Allergies  Allergen Reactions  . Amoxicillin Hives    Sores in mouth  . Buprenorphine Hcl Itching    Can tolerate if necessary  . Morphine And Related Itching    Can tolerate if necessary  . Sulfonamide Derivatives Hives    Sores in mouth  . Valium [Diazepam] Itching    Can tolerate if necessary   Medical History: Past Medical History:  Diagnosis Date  . Adenomatous polyp    x1  . ALLERGIC RHINITIS 12/20/2006  . Chronic anxiety   . Diverticulitis   . GERD (gastroesophageal reflux disease)    intermittent, treated with tums  . Hiatal hernia   . Hypothyroidism   . Insomnia   . Lumbago   .  Osteoporosis 2007   declined prolia  . Perforation of right tympanic membrane 09/03/2015   Chronic, seen by Dr Janace Hoard ENT   . Personal history of kidney stones    x1  . Postmenopausal atrophic vaginitis 2016   rec estrace cream - [place]  . Recurrent UTI   . Scoliosis   . Severe recurrent depression with psychosis ([place])   . Urinary retention   . Vitamin D deficiency    Surgical History: Past Surgical History:  Procedure Laterality Date  . ABDOMINAL HYSTERECTOMY  1979   ectopic pregnancy with one ovary removed  . APPENDECTOMY  1954  . CHOLECYSTECTOMY    . ESOPHAGOGASTRODUODENOSCOPY    . KNEE SURGERY Left 2012   L medial meniscal tear  . OOPHORECTOMY Right 1965   Ectopic pregnancy, removal of right ovary and tube- 1960  . ROTATOR CUFF REPAIR Right   . TONSILLECTOMY     Family History: family history includes Anxiety disorder in her grandchild; Cancer in her sister and sister; Depression in her grandchild; Diabetes in her brother, father, mother, and sister; Healthy in her sister; Schizophrenia in her sister. Reviewed and no changes noted.  Current Medications:  Current Outpatient Prescriptions  Medication Sig Dispense Refill  . benztropine (COGENTIN) 0.5 MG tablet TAKE 1 TABLET THREE TIMES A DAY AS NEEDED FOR TREMORS 180 tablet 2  . bisacodyl (DULCOLAX) 10 MG suppository Place 1 suppository (10 mg total) rectally daily as needed for moderate constipation. 12 suppository 0  . Calcium Carbonate-Vitamin D (CALCIUM-D PO) Take 1 tablet by mouth daily.    . clonazePAM (KLONOPIN) 1 MG tablet Take 1 tablet (1 mg total) by mouth at bedtime. 90 tablet 2  . cyclobenzaprine (FLEXERIL) 10 MG tablet Take 10 mg by mouth 3 (three) times daily as needed for muscle spasms.    Marland Kitchen dicyclomine (BENTYL) 10 MG capsule Take 10 mg by mouth daily as needed for spasms. For diverticulitis    . fluticasone (FLONASE) 50 MCG/ACT nasal spray Place 2 sprays into both nostrils daily as needed for allergies or  rhinitis. 16 g 3  . hydrocortisone (ANUSOL-HC) 25 MG suppository Place 1 suppository (25 mg total) rectally 2 (two) times daily  as needed for hemorrhoids or itching. 12 suppository 0  . lamoTRIgine (LAMICTAL) 25 MG tablet Take 2 tablets (50 mg total) by mouth 2 (two) times daily. 360 tablet 2  . levothyroxine (SYNTHROID, LEVOTHROID) 88 MCG tablet TAKE 1 TABLET EVERY MORNING (NEED OFFICE VISIT AFTER ONE REFILL) 30 tablet 3  . montelukast (SINGULAIR) 10 MG tablet TAKE 1 TABLET AT BEDTIME 90 tablet 0  . PROAIR HFA 108 (90 Base) MCG/ACT inhaler USE 2 INHALATIONS EVERY 6 HOURS AS NEEDED FOR COUGH 25.5 g 1  . traMADol (ULTRAM) 50 MG tablet Take 50 mg by mouth every 6 (six) hours as needed for moderate pain.    Marland Kitchen venlafaxine (EFFEXOR) 37.5 MG tablet Take 1 tablet (37.5 mg total) by mouth 2 (two) times daily. 180 tablet 2  . Vitamin D, Ergocalciferol, (DRISDOL) 50000 UNITS CAPS capsule TAKE 1 CAPSULE EVERY 7 DAYS 12 capsule 2  . OLANZapine (ZYPREXA) 2.5 MG tablet Take one in the am and two at bedtime 90 tablet 2   No current facility-administered medications for this visit.     Previous Psychotropic Medications:  Medication Dose   Klonopin     Lithium ?    Risperdal    Lamictal    Effexor    Trazodone    Substance Abuse History in the last 12 months: Substance Age of 1st Use Last Use Amount Specific Type  Nicotine  none        Alcohol  20  March 13      Cannabis  none        Opiates  none        Cocaine  none        Methamphetamines  none        LSD  none        Ecstasy  none         Benzodiazepines  [age]      Caffeine  childhood  this AM      Inhalants  none        Others:       sugar  childhood  this AM     Medical Consequences of Substance Abuse: none  Legal Consequences of Substance Abuse: none  Family Consequences of Substance Abuse: none  Social History: Current Place of Residence: [address] [place] [id number] Place of Birth: [place], [place] Family  Members: husband Marital Status:  Married Children: 1  Sons: 0  Daughters: 1 Relationships: husbandf Education:  Dentist Problems/Performance: good Religious Beliefs/Practices: [employer] History of Abuse: none Occupational Experiences: clerical, office, Architect History:  Press photographer History: none Hobbies/Interests: walking, reading, playing cards  Mental Status Examination/Evaluation: Objective:  Appearance: Casual neatly dressed   Eye Contact::  Good  Speech: Clear and coherent   Volume:  Normal  Mood:Good   Affect:  Congruent  Thought Process fairly organized today   Thought content: Hallucinations of music and voices are back     Suicidal Thoughts:  No  Homicidal Thoughts:  No  Judgement:  Good  Insight:  Good  Psychomotor Activity:  Normal  Akathisia:  No  Handed:  Right  AIMS (if indicated):    Assets:  Communication Skills Desire for Improvement   Lab Results:  Results for orders placed or performed in visit on 12/26/15 (from the past 8736 hour(s))  T4, free   Collection Time: 12/26/15  1:39 PM  Result Value Ref Range   Free T4 1.10 0.60 -  1.60 ng/dL  TSH   Collection Time: 12/26/15  1:39 PM  Result Value Ref Range   TSH 2.70 0.35 - 4.50 uIU/mL  VITAMIN D 25 Hydroxy (Vit-D Deficiency, Fractures)   Collection Time: 12/26/15  1:39 PM  Result Value Ref Range   VITD 35.70 30.00 - 100.00 ng/mL  Results for orders placed or performed in visit on 06/22/15 (from the past 8736 hour(s))  POCT Influenza A/B   Collection Time: 06/22/15  8:59 AM  Result Value Ref Range   Influenza A, POC Negative Negative   Influenza B, POC Negative Negative   Assessment:    AXIS I  major depression with psychotic features, rule out dementia   AXIS II Deferred  AXIS III Past Medical History:  Diagnosis Date  . Adenomatous polyp    x1  . ALLERGIC RHINITIS 12/20/2006  . Chronic anxiety   . Diverticulitis   . GERD (gastroesophageal reflux disease)     intermittent, treated with tums  . Hiatal hernia   . Hypothyroidism   . Insomnia   . Lumbago   . Osteoporosis 2007   declined prolia  . Perforation of right tympanic membrane 09/03/2015   Chronic, seen by Dr Janace Hoard ENT   . Personal history of kidney stones    x1  . Postmenopausal atrophic vaginitis 2016   rec estrace cream - [place]  . Recurrent UTI   . Scoliosis   . Severe recurrent depression with psychosis ([place])   . Urinary retention   . Vitamin D deficiency      AXIS IV economic problems and other psychosocial or environmental problems  AXIS V 61-70 mild symptoms   Treatment Plan/Recommendations:  Laboratory:    Psychotherapy: supportive  Medications: As below   Routine PRN Medications:  No  Consultations: none  Safety Concerns:  none  Other:     Plan/Discussion: I took her vitals.  I reviewed CC, tobacco/med/surg Hx, meds effects/ side effects, problem list, therapies and responses as well as current situation/symptoms discussed options.  She will continue Effexor for depression and Lamictal for mood stabilization. She will continue clonazepam to 1 mg at bedtime to help with sleep. She will discontinue Seroquel and start olanzapine 2.5 mg in the morning and 5 mg at bedtime She will call if hallucinations worsen or if she gets urinary retention again. She will continue the Cogentin for tremor. She'll return to see me in 4 weeks See orders and pt instructions for more details.  MEDICATIONS this encounter: Meds ordered this encounter  Medications  . lamoTRIgine (LAMICTAL) 25 MG tablet    Sig: Take 2 tablets (50 mg total) by mouth 2 (two) times daily.    Dispense:  360 tablet    Refill:  2  . venlafaxine (EFFEXOR) 37.5 MG tablet    Sig: Take 1 tablet (37.5 mg total) by mouth 2 (two) times daily.    Dispense:  180 tablet    Refill:  2  . clonazePAM (KLONOPIN) 1 MG tablet    Sig: Take 1 tablet (1 mg total) by mouth at bedtime.    Dispense:  90 tablet    Refill:  2   . OLANZapine (ZYPREXA) 2.5 MG tablet    Sig: Take one in the am and two at bedtime    Dispense:  90 tablet    Refill:  2    Medical Decision Making Problem Points:  Established problem, stable/improving (1), New problem, with no additional work-up planned (3), Review of last therapy session (  1) and Review of psycho-social stressors (1) Data Points:  Review or order clinical lab tests (1) Review of medication regiment & side effects (2) Review of new medications or change in dosage (2)  four-week's I certify that outpatient services furnished can reasonably be expected to improve the patient's condition.   Levonne Spiller, MD   Patient ID: Jamie Pitts, female   DOB: [birth year], 83 y.o.   MRN: [id number]

## 2016-03-19 ENCOUNTER — Other Ambulatory Visit: Payer: Self-pay | Admitting: Family Medicine

## 2016-03-20 DIAGNOSIS — H722X1 Other marginal perforations of tympanic membrane, right ear: Secondary | ICD-10-CM | POA: Diagnosis not present

## 2016-03-27 ENCOUNTER — Encounter: Payer: Self-pay | Admitting: Family Medicine

## 2016-03-27 ENCOUNTER — Ambulatory Visit (INDEPENDENT_AMBULATORY_CARE_PROVIDER_SITE_OTHER): Payer: Medicare Other | Admitting: Family Medicine

## 2016-03-27 DIAGNOSIS — J0100 Acute maxillary sinusitis, unspecified: Secondary | ICD-10-CM

## 2016-03-27 MED ORDER — DOXYCYCLINE HYCLATE 100 MG PO TABS: 100.0000 mg | ORAL_TABLET | Freq: Two times a day (BID) | ORAL | 0 refills | Status: DC

## 2016-03-27 NOTE — Patient Instructions (Signed)
Start doxycycline for a likely sinus infection.  Rest and fluids in the meantime.  Take care.  Glad to see you.

## 2016-03-27 NOTE — Progress Notes (Signed)
Pre visit review using our clinic review tool, if applicable. No additional management support is needed unless otherwise documented below in the visit note. 

## 2016-03-27 NOTE — Progress Notes (Signed)
Sick for about 6 days.  Facial pressure, max and frontal sinuses. Green/yellow nasal discharge.  Possible fever, general malaise.  No body aches.  Still coughing.  Some dec in wheeze with SABA.  No vomiting, no diarrhea.    Meds, vitals, and allergies reviewed.   ROS: Per HPI unless specifically indicated in ROS section   GEN: nad, alert and oriented HEENT: mucous membranes moist, tm w/o erythema, nasal exam w/o erythema, clear discharge noted,  OP with cobblestoning, L max sinus ttp.   NECK: supple w/o LA CV: rrr.   PULM: ctab, no inc wob EXT: no edema

## 2016-03-28 NOTE — Assessment & Plan Note (Signed)
Nontoxic. Start doxycycline. Rest and fluids. Update [place] as needed. She agrees.

## 2016-03-29 ENCOUNTER — Ambulatory Visit (HOSPITAL_COMMUNITY): Payer: Self-pay | Admitting: Psychiatry

## 2016-04-05 ENCOUNTER — Ambulatory Visit (INDEPENDENT_AMBULATORY_CARE_PROVIDER_SITE_OTHER): Payer: Medicare Other | Admitting: Psychiatry

## 2016-04-05 ENCOUNTER — Encounter (HOSPITAL_COMMUNITY): Payer: Self-pay | Admitting: Psychiatry

## 2016-04-05 VITALS — BP 128/81 | HR 93 | Ht 65.0 in | Wt 176.0 lb

## 2016-04-05 DIAGNOSIS — Z888 Allergy status to other drugs, medicaments and biological substances status: Secondary | ICD-10-CM

## 2016-04-05 DIAGNOSIS — F341 Dysthymic disorder: Secondary | ICD-10-CM

## 2016-04-05 DIAGNOSIS — Z9049 Acquired absence of other specified parts of digestive tract: Secondary | ICD-10-CM

## 2016-04-05 DIAGNOSIS — Z79899 Other long term (current) drug therapy: Secondary | ICD-10-CM

## 2016-04-05 DIAGNOSIS — Z818 Family history of other mental and behavioral disorders: Secondary | ICD-10-CM

## 2016-04-05 DIAGNOSIS — Z882 Allergy status to sulfonamides status: Secondary | ICD-10-CM

## 2016-04-05 DIAGNOSIS — Z9889 Other specified postprocedural states: Secondary | ICD-10-CM

## 2016-04-05 DIAGNOSIS — Z90710 Acquired absence of both cervix and uterus: Secondary | ICD-10-CM | POA: Diagnosis not present

## 2016-04-05 DIAGNOSIS — Z808 Family history of malignant neoplasm of other organs or systems: Secondary | ICD-10-CM

## 2016-04-05 DIAGNOSIS — Z833 Family history of diabetes mellitus: Secondary | ICD-10-CM

## 2016-04-05 MED ORDER — LAMOTRIGINE 25 MG PO TABS: 50.0000 mg | ORAL_TABLET | Freq: Two times a day (BID) | ORAL | 2 refills | Status: DC

## 2016-04-05 MED ORDER — OLANZAPINE 2.5 MG PO TABS: ORAL_TABLET | ORAL | 2 refills | Status: DC

## 2016-04-05 MED ORDER — CLONAZEPAM 1 MG PO TABS: 1.0000 mg | ORAL_TABLET | Freq: Every day | ORAL | 2 refills | Status: DC

## 2016-04-05 MED ORDER — BENZTROPINE MESYLATE 0.5 MG PO TABS: ORAL_TABLET | ORAL | 2 refills | Status: DC

## 2016-04-05 MED ORDER — VENLAFAXINE HCL 37.5 MG PO TABS: 37.5000 mg | ORAL_TABLET | Freq: Two times a day (BID) | ORAL | 2 refills | Status: DC

## 2016-04-05 NOTE — Patient Instructions (Signed)
Do not take any more risperidal!

## 2016-04-05 NOTE — Progress Notes (Signed)
Patient ID: Jamie Pitts, female   DOB: 07/20/1933, 83 y.o.   MRN: [id number] Patient ID: Clarrissa Sunny, female   DOB: 12-Apr-1933, 83 y.o.   MRN: [id number] Patient ID: Chantrice Bohnen, female   DOB: December 11, 1933, 83 y.o.   MRN: [id number] Patient ID: Guelda Stankus, female   DOB: April 13, 1933, 83 y.o.   MRN: [id number] Patient ID: Carice Coney, female   DOB: [birth year], 83 y.o.   MRN: [id number] Patient ID: Hertha Camposano, female   DOB: [birth year], 83 y.o.   MRN: [id number] Patient ID: Collyns Burczyk, female   DOB: October 16, 1933, 83 y.o.   MRN: [id number] Patient ID: Kiziah Pin, female   DOB: [birth year], 83 y.o.   MRN: [id number] Patient ID: Amiliya Dueno, female   DOB: Apr 30, 1933, 83 y.o.   MRN: [id number] Patient ID: Meron Nohl, female   DOB: 11-06-1933, 83 y.o.   MRN: [id number] Patient ID: Satya Bullis, female   DOB: [birth year], 83 y.o.   MRN: [id number] Patient ID: Morgan Barkman, female   DOB: [birth year], 83 y.o.   MRN: [id number] [facility] [id number] Progress Note Hermia Clohessy MRN: [id number] DOB: [birth year] Age: 83 y.o.  Date: 04/05/2016 Start Time: 10:20 AM End Time: 10:50 AM  Chief Complaint: Chief Complaint  Patient presents with  . Depression  . Anxiety  . Hallucinations  . Follow-up   Subjective: "The voices are gone"  This patient is a 83 year old married white female who lives with her husband in [place]. She has one daughter and 3 grandchildren. She is retired from the [employer].  Apparently the patient had some sort of psychotic episode in 2013. She was admitted to [facility] because she was hearing voices at home. The voices got so bad that she cannot put a gun under the bed and wanted to shoot him. The patient is a poor historian and is hard of hearing and gets easily confused. She was placed on various medicines in the hospital which have been continued. It's unclear if she is medication compliant. For example she's not taking the  Cogentin. She repeats herself a lot.  The patient states she still hearing voices every day. They're not frightening her like they were before but there seemed to her and she can't always get them to stop. She sleeping pretty well she denies being depressed but she is obviously not thinking clearly. She mentions that she's been diagnosed with parkinsonism but she doesn't have a tremor.  The patient returns after 3 months. She states that the olanzapine is really helped and she's no longer stay hearing voices or music. She has noticed an appetite increase but she is trying hard not to give into it. She's having her teeth pulled and dentures put and so this is made it difficult for her to eat anyway. She states that her mood is good she is sleeping well. She claims she sometimes uses one of her "yellow pills" and I'm not sure if this is Seroquel or Risperdal but I told her not to use anything for psychosis other than the olanzapine and she voices understanding   History of Chief Complaint:   At age 33 pt went to a "fat farm" to fatten her up.  She remembers her father paid more attention to her than her mother did.  She experienced some anxiety when she [place].  She was in [employer] for 2 years, then she was the Glass blower/designer for [employer] and then got into [employer].  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  She noted feeling very home sick to go back to [place] her home place in 2010.  Her sister had been very nervous and she was beginning to feel anxious and was started on Klonopin.  ISince then she noted songs for 3 or 4 years.  Then in October of 2013, she started hearing voices non stop and she lost 30 pounds. This prompted her to get a gun and put it under the bed and the family got concerned.  She was admitted to [facility] on January 24th.  She was stopped from the Klonopin and Lithium (pt doesn't understand how she got on that) and placed on Ripserdal, Trazodone, Lamictal,  and Effexor with pretty good results, except she has very dry mouth at nigh  She has noted blurred vision since starting the meds.  She is getting new glasses for herself.  Depression         Associated symptoms include decreased concentration.  Associated symptoms include no headaches and no suicidal ideas.  Past medical history includes anxiety.   Anxiety  Symptoms include confusion, decreased concentration and nervous/anxious behavior. Patient reports no dizziness or suicidal ideas.     Review of Systems  HENT: Negative.   Gastrointestinal: Negative.   Genitourinary: Negative.   Neurological: Positive for weakness and light-headedness. Negative for dizziness, tremors, seizures, syncope, facial asymmetry, speech difficulty, numbness and headaches.       Lightheaded if gets up too fast.  Psychiatric/Behavioral: Positive for confusion, decreased concentration, depression and dysphoric mood. Negative for agitation, behavioral problems, hallucinations, self-injury, sleep disturbance and suicidal ideas. The patient is nervous/anxious. The patient is not hyperactive.    Physical Exam  Depressive Symptoms: none now  (Hypo) Manic Symptoms:   None  Anxiety Symptoms: Excessive Worry:  Yes Panic Symptoms:  No Agoraphobia:  No Obsessive Compulsive: Yes  Symptoms: ordliness Specific Phobias:  Yes Social Anxiety:  No  Psychotic Symptoms:  None  PTSD Symptoms: Ever had a traumatic exposure:  Yes Had a traumatic exposure in the last month:  No No other features  Traumatic Brain Injury: No   Past Psychiatric History: Diagnosis: Depression  Hospitalizations: once [facility]  Outpatient Care: PCP  Substance Abuse Care: none  Self-Mutilation: none  Suicidal Attempts: none  Violent Behaviors: one episode of getting a gun, but now sees that that is not the way to handle her voices    Allergies: Allergies  Allergen Reactions  . Amoxicillin Hives    Sores in mouth  . Buprenorphine Hcl  Itching    Can tolerate if necessary  . Morphine And Related Itching    Can tolerate if necessary  . Sulfonamide Derivatives Hives    Sores in mouth  . Valium [Diazepam] Itching    Can tolerate if necessary   Past Medical History:   Past Medical History:  Diagnosis Date  . Adenomatous polyp    x1  . ALLERGIC RHINITIS 12/20/2006  . Chronic anxiety   . Diverticulitis   . GERD (gastroesophageal reflux disease)    intermittent, treated with tums  . Hiatal hernia   . Hypothyroidism   . Insomnia   . Lumbago   . Osteoporosis 2007   declined prolia  . Perforation of right tympanic membrane 09/03/2015   Chronic, seen by Dr Janace Hoard ENT   . Personal history of kidney stones    x1  . Postmenopausal atrophic vaginitis 2016   rec estrace cream - [place]  . Recurrent UTI   .  Scoliosis   . Severe recurrent depression with psychosis ([place])   . Urinary retention   . Vitamin D deficiency    Surgical History: Past Surgical History:  Procedure Laterality Date  . ABDOMINAL HYSTERECTOMY  1979   ectopic pregnancy with one ovary removed  . APPENDECTOMY  1954  . CHOLECYSTECTOMY    . ESOPHAGOGASTRODUODENOSCOPY    . KNEE SURGERY Left 2012   L medial meniscal tear  . OOPHORECTOMY Right 1965   Ectopic pregnancy, removal of right ovary and tube- 1960  . ROTATOR CUFF REPAIR Right   . TONSILLECTOMY     History of Loss of Consciousness:  No Seizure History:  No Cardiac History:  No Allergies: Allergies  Allergen Reactions  . Amoxicillin Hives    Sores in mouth  . Buprenorphine Hcl Itching    Can tolerate if necessary  . Morphine And Related Itching    Can tolerate if necessary  . Sulfonamide Derivatives Hives    Sores in mouth  . Valium [Diazepam] Itching    Can tolerate if necessary   Medical History: Past Medical History:  Diagnosis Date  . Adenomatous polyp    x1  . ALLERGIC RHINITIS 12/20/2006  . Chronic anxiety   . Diverticulitis   . GERD (gastroesophageal reflux disease)     intermittent, treated with tums  . Hiatal hernia   . Hypothyroidism   . Insomnia   . Lumbago   . Osteoporosis 2007   declined prolia  . Perforation of right tympanic membrane 09/03/2015   Chronic, seen by Dr Janace Hoard ENT   . Personal history of kidney stones    x1  . Postmenopausal atrophic vaginitis 2016   rec estrace cream - [place]  . Recurrent UTI   . Scoliosis   . Severe recurrent depression with psychosis ([place])   . Urinary retention   . Vitamin D deficiency    Surgical History: Past Surgical History:  Procedure Laterality Date  . ABDOMINAL HYSTERECTOMY  1979   ectopic pregnancy with one ovary removed  . APPENDECTOMY  1954  . CHOLECYSTECTOMY    . ESOPHAGOGASTRODUODENOSCOPY    . KNEE SURGERY Left 2012   L medial meniscal tear  . OOPHORECTOMY Right 1965   Ectopic pregnancy, removal of right ovary and tube- 1960  . ROTATOR CUFF REPAIR Right   . TONSILLECTOMY     Family History: family history includes Anxiety disorder in her grandchild; Cancer in her sister and sister; Depression in her grandchild; Diabetes in her brother, father, mother, and sister; Healthy in her sister; Schizophrenia in her sister. Reviewed and no changes noted.  Current Medications:  Current Outpatient Prescriptions  Medication Sig Dispense Refill  . benztropine (COGENTIN) 0.5 MG tablet TAKE 1 TABLET THREE TIMES A DAY AS NEEDED FOR TREMORS 180 tablet 2  . bisacodyl (DULCOLAX) 10 MG suppository Place 1 suppository (10 mg total) rectally daily as needed for moderate constipation. 12 suppository 0  . Calcium Carbonate-Vitamin D (CALCIUM-D PO) Take 1 tablet by mouth daily.    . clonazePAM (KLONOPIN) 1 MG tablet Take 1 tablet (1 mg total) by mouth at bedtime. 90 tablet 2  . cyclobenzaprine (FLEXERIL) 10 MG tablet Take 10 mg by mouth 3 (three) times daily as needed for muscle spasms.    Marland Kitchen dicyclomine (BENTYL) 10 MG capsule Take 10 mg by mouth daily as needed for spasms. For diverticulitis    .  doxycycline (VIBRA-TABS) 100 MG tablet Take 1 tablet (100 mg total) by mouth 2 (two) times  daily. 20 tablet 0  . fluticasone (FLONASE) 50 MCG/ACT nasal spray Place 2 sprays into both nostrils daily as needed for allergies or rhinitis. 16 g 3  . hydrocortisone (ANUSOL-HC) 25 MG suppository Place 1 suppository (25 mg total) rectally 2 (two) times daily as needed for hemorrhoids or itching. 12 suppository 0  . lamoTRIgine (LAMICTAL) 25 MG tablet Take 2 tablets (50 mg total) by mouth 2 (two) times daily. 360 tablet 2  . levothyroxine (SYNTHROID, LEVOTHROID) 88 MCG tablet TAKE 1 TABLET EVERY MORNING (NEED OFFICE VISIT AFTER ONE REFILL) 30 tablet 3  . montelukast (SINGULAIR) 10 MG tablet TAKE 1 TABLET AT BEDTIME 90 tablet 0  . OLANZapine (ZYPREXA) 2.5 MG tablet Take one in the am and two at bedtime 270 tablet 2  . PROAIR HFA 108 (90 Base) MCG/ACT inhaler USE 2 INHALATIONS EVERY 6 HOURS AS NEEDED FOR COUGH 25.5 g 1  . traMADol (ULTRAM) 50 MG tablet Take 50 mg by mouth every 6 (six) hours as needed for moderate pain.    Marland Kitchen venlafaxine (EFFEXOR) 37.5 MG tablet Take 1 tablet (37.5 mg total) by mouth 2 (two) times daily. 180 tablet 2  . Vitamin D, Ergocalciferol, (DRISDOL) 50000 UNITS CAPS capsule TAKE 1 CAPSULE EVERY 7 DAYS 12 capsule 2   No current facility-administered medications for this visit.     Previous Psychotropic Medications:  Medication Dose   Klonopin     Lithium ?    Risperdal    Lamictal    Effexor    Trazodone    Substance Abuse History in the last 12 months: Substance Age of 1st Use Last Use Amount Specific Type  Nicotine  none        Alcohol  20  March 13      Cannabis  none        Opiates  none        Cocaine  none        Methamphetamines  none        LSD  none        Ecstasy  none         Benzodiazepines  [age]      Caffeine  childhood  this AM      Inhalants  none        Others:       sugar  childhood  this AM     Medical Consequences of Substance Abuse:  none  Legal Consequences of Substance Abuse: none  Family Consequences of Substance Abuse: none  Social History: Current Place of Residence: [address] [place] [address] Place of Birth: , [place] Family Members: husband Marital Status:  Married Children: 1  Sons: 0  Daughters: 1 Relationships: husbandf Education:  Dentist Problems/Performance: good Religious Beliefs/Practices: [employer] History of Abuse: none Occupational Experiences: clerical, office, Architect History:  Press photographer History: none Hobbies/Interests: walking, reading, playing cards  Mental Status Examination/Evaluation: Objective:  Appearance: Casual neatly dressed   Eye Contact::  Good  Speech: Clear and coherent   Volume:  Normal  Mood:Good   Affect:  Congruent  Thought Process fairly organized today   Thought content: Hallucinations of music and voices are Now gone     Suicidal Thoughts:  No  Homicidal Thoughts:  No  Judgement:  Good  Insight:  Good  Psychomotor Activity:  Normal  Akathisia:  No  Handed:  Right  AIMS (if indicated):    Assets:  Communication  Skills Desire for Improvement   Lab Results:  Results for orders placed or performed in visit on 12/26/15 (from the past 8736 hour(s))  T4, free   Collection Time: 12/26/15  1:39 PM  Result Value Ref Range   Free T4 1.10 0.60 - 1.60 ng/dL  TSH   Collection Time: 12/26/15  1:39 PM  Result Value Ref Range   TSH 2.70 0.35 - 4.50 uIU/mL  VITAMIN D 25 Hydroxy (Vit-D Deficiency, Fractures)   Collection Time: 12/26/15  1:39 PM  Result Value Ref Range   VITD 35.70 30.00 - 100.00 ng/mL  Results for orders placed or performed in visit on 06/22/15 (from the past 8736 hour(s))  POCT Influenza A/B   Collection Time: 06/22/15  8:59 AM  Result Value Ref Range   Influenza A, POC Negative Negative   Influenza B, POC Negative Negative   Assessment:    AXIS I  major depression with psychotic  features, rule out dementia   AXIS II Deferred  AXIS III Past Medical History:  Diagnosis Date  . Adenomatous polyp    x1  . ALLERGIC RHINITIS 12/20/2006  . Chronic anxiety   . Diverticulitis   . GERD (gastroesophageal reflux disease)    intermittent, treated with tums  . Hiatal hernia   . Hypothyroidism   . Insomnia   . Lumbago   . Osteoporosis 2007   declined prolia  . Perforation of right tympanic membrane 09/03/2015   Chronic, seen by Dr Janace Hoard ENT   . Personal history of kidney stones    x1  . Postmenopausal atrophic vaginitis 2016   rec estrace cream - [place]  . Recurrent UTI   . Scoliosis   . Severe recurrent depression with psychosis ([place])   . Urinary retention   . Vitamin D deficiency      AXIS IV economic problems and other psychosocial or environmental problems  AXIS V 61-70 mild symptoms   Treatment Plan/Recommendations:  Laboratory:    Psychotherapy: supportive  Medications: As below   Routine PRN Medications:  No  Consultations: none  Safety Concerns:  none  Other:     Plan/Discussion: I took her vitals.  I reviewed CC, tobacco/med/surg Hx, meds effects/ side effects, problem list, therapies and responses as well as current situation/symptoms discussed options.  She will continue Effexor for depression and Lamictal for mood stabilization. She will continue clonazepam to 1 mg at bedtime to help with sleep. She will Continue olanzapine 2.5 mg in the morning and 5 mg at bedtime She will call if hallucinations worsen or if she gets urinary retention again. She will continue the Cogentin for tremor. She'll return to see me in 3 months See orders and pt instructions for more details.  MEDICATIONS this encounter: Meds ordered this encounter  Medications  . DISCONTD: OLANZapine (ZYPREXA) 2.5 MG tablet    Sig: Take one in the am and two at bedtime    Dispense:  270 tablet    Refill:  2  . venlafaxine (EFFEXOR) 37.5 MG tablet    Sig: Take 1 tablet (37.5 mg  total) by mouth 2 (two) times daily.    Dispense:  180 tablet    Refill:  2  . benztropine (COGENTIN) 0.5 MG tablet    Sig: TAKE 1 TABLET THREE TIMES A DAY AS NEEDED FOR TREMORS    Dispense:  180 tablet    Refill:  2  . lamoTRIgine (LAMICTAL) 25 MG tablet    Sig: Take 2 tablets (50 mg  total) by mouth 2 (two) times daily.    Dispense:  360 tablet    Refill:  2  . OLANZapine (ZYPREXA) 2.5 MG tablet    Sig: Take one in the am and two at bedtime    Dispense:  270 tablet    Refill:  2  . clonazePAM (KLONOPIN) 1 MG tablet    Sig: Take 1 tablet (1 mg total) by mouth at bedtime.    Dispense:  90 tablet    Refill:  2    Medical Decision Making Problem Points:  Established problem, stable/improving (1), New problem, with no additional work-up planned (3), Review of last therapy session (1) and Review of psycho-social stressors (1) Data Points:  Review or order clinical lab tests (1) Review of medication regiment & side effects (2) Review of new medications or change in dosage (2)  four-week's I certify that outpatient services furnished can reasonably be expected to improve the patient's condition.   Levonne Spiller, MD   Patient ID: Mihira Peyer, female   DOB: 04/23/1933, 83 y.o.   MRN: [id number]

## 2016-05-06 ENCOUNTER — Telehealth (HOSPITAL_COMMUNITY): Payer: Self-pay | Admitting: *Deleted

## 2016-05-06 NOTE — Telephone Encounter (Signed)
Pt called stating she would like to sch an appt with Dr. Harrington Challenger as soon as possible. Asked pt for more details. Per pt, her brain is raising and planking. Asked pt what does she mean about planking and she stated she don't know what it means, she just know it's called planking. Asked pt if she was suicidal to self or anyone else and she stated no. Per pt, she's just having really loud singing and increased voice spells. Per pt she need to go on some type of medication. Scheduled pt for Thursday 05-09-2016 due to it being providers first opening and pt stated she will take appt but it was too far. Per pt she wanted a sooner appt but she will keep appt and call office back tomorrow due to being informed that provider was out of office today.

## 2016-05-06 NOTE — Telephone Encounter (Signed)
noted 

## 2016-05-06 NOTE — Telephone Encounter (Signed)
voice message from patient, she wants to know if she can take Risperdal with hydrocodone.

## 2016-05-06 NOTE — Telephone Encounter (Signed)
Discussed with patient. Informed the patient that she can take both olanzapine (she is not taking risperdal as written in message) and hydrocodone. Patient is also explained the risk of oversedation/death from taking both clonazepam and hydrocodone. Patient verbalizes understanding.

## 2016-05-09 ENCOUNTER — Encounter (HOSPITAL_COMMUNITY): Payer: Self-pay | Admitting: Psychiatry

## 2016-05-09 ENCOUNTER — Ambulatory Visit (INDEPENDENT_AMBULATORY_CARE_PROVIDER_SITE_OTHER): Payer: Medicare Other | Admitting: Psychiatry

## 2016-05-09 VITALS — BP 129/66 | HR 88 | Ht 65.0 in | Wt 172.2 lb

## 2016-05-09 DIAGNOSIS — Z818 Family history of other mental and behavioral disorders: Secondary | ICD-10-CM | POA: Diagnosis not present

## 2016-05-09 DIAGNOSIS — Z79899 Other long term (current) drug therapy: Secondary | ICD-10-CM | POA: Diagnosis not present

## 2016-05-09 DIAGNOSIS — F29 Unspecified psychosis not due to a substance or known physiological condition: Secondary | ICD-10-CM

## 2016-05-09 DIAGNOSIS — F341 Dysthymic disorder: Secondary | ICD-10-CM | POA: Diagnosis not present

## 2016-05-09 MED ORDER — OLANZAPINE 5 MG PO TABS: ORAL_TABLET | ORAL | 2 refills | Status: DC

## 2016-05-09 NOTE — Progress Notes (Signed)
Patient ID: Jamie Pitts, female   DOB: 10-Feb-1934, 83 y.o.   MRN: [id number] Patient ID: Jamie Pitts, female   DOB: [birth year], 83 y.o.   MRN: [id number] Patient ID: Jamie Pitts, female   DOB: April 17, 1933, 83 y.o.   MRN: [id number] Patient ID: Jamie Pitts, female   DOB: 1933-11-07, 83 y.o.   MRN: [id number] Patient ID: Jamie Pitts, female   DOB: 02-09-1934, 83 y.o.   MRN: [id number] Patient ID: Jamie Pitts, female   DOB: May 26, 1933, 83 y.o.   MRN: [id number] Patient ID: Jamie Pitts, female   DOB: 1933-07-05, 83 y.o.   MRN: [id number] Patient ID: Jamie Pitts, female   DOB: 04-Feb-1934, 83 y.o.   MRN: [id number] Patient ID: Jamie Pitts, female   DOB: 09-07-1933, 83 y.o.   MRN: [id number] Patient ID: Jamie Pitts, female   DOB: [birth year], 83 y.o.   MRN: [id number] Patient ID: Jamie Pitts, female   DOB: 1933-11-29, 83 y.o.   MRN: [id number] Patient ID: Jamie Pitts, female   DOB: 16-Feb-1934, 83 y.o.   MRN: [id number] [facility] [id number] Progress Note Jamie Pitts MRN: [id number] DOB: 1933-06-27 Age: 83 y.o.  Date: 05/09/2016 Start Time: 10:20 AM End Time: 10:50 AM  Chief Complaint: Chief Complaint  Patient presents with  . Manic Behavior  . Depression  . Hallucinations  . Follow-up   Subjective: "The voices are back"  This patient is a 83 year old married white female who lives with her husband in [place]. She has one daughter and 3 grandchildren. She is retired from the [employer].  Apparently the patient had some sort of psychotic episode in 2013. She was admitted to [facility] because she was hearing voices at home. The voices got so bad that she cannot put a gun under the bed and wanted to shoot him. The patient is a poor historian and is hard of hearing and gets easily confused. She was placed on various medicines in the hospital which have been continued. It's unclear if she is medication compliant. For example she's not taking the  Cogentin. She repeats herself a lot.  The patient states she still hearing voices every day. They're not frightening her like they were before but there seemed to her and she can't always get them to stop. She sleeping pretty well she denies being depressed but she is obviously not thinking clearly. She mentions that she's been diagnosed with parkinsonism but she doesn't have a tremor.  The patient returns after 4 weeks. She is seen as a work in today. She states that despite telling me last time that she was feeling good the voices have come back. Last week she started hearing loud music followed by voices and her thoughts of been racing. She thinks that she is taking both the olanzapine and the Risperdal. I told her not to do this because of the risk of stroke in elderly and the combined risk of antipsychotics was dangerous for her and she voices understanding. She states that she just wants the thoughts and voices to stop. She denies any thoughts of harm to self or others. I told her to get rid of all the other antipsychotics and that we would increase her olanzapine to 5 mg in the morning and 10 mg at night. She is sleeping well with the clonazepam and she is polite today but does appear anxious   History of Chief Complaint:   At age 65 pt went to a "fat farm" to fatten her up.  She remembers her father  paid more attention to her than her mother did.  She experienced some anxiety [place].  She was in [employer] for 2 years, then she was the Glass blower/designer for [employer] and then got into [employer].  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  She noted feeling very home sick to go back to [place] her home place in 2010.  Her sister had been very nervous and she was beginning to feel anxious and was started on Klonopin.  ISince then she noted songs for 3 or 4 years.  Then in October of 2013, she started hearing voices non stop and she lost 30 pounds. This prompted her to get a gun and put it  under the bed and the family got concerned.  She was admitted to [facility] on January 24th.  She was stopped from the Klonopin and Lithium (pt doesn't understand how she got on that) and placed on Ripserdal, Trazodone, Lamictal, and Effexor with pretty good results, except she has very dry mouth at nigh  She has noted blurred vision since starting the meds.  She is getting new glasses for herself.  Depression         Associated symptoms include decreased concentration.  Associated symptoms include no headaches and no suicidal ideas.  Past medical history includes anxiety.   Anxiety  Symptoms include confusion, decreased concentration and nervous/anxious behavior. Patient reports no dizziness or suicidal ideas.     Review of Systems  HENT: Negative.   Gastrointestinal: Negative.   Genitourinary: Negative.   Neurological: Positive for weakness and light-headedness. Negative for dizziness, tremors, seizures, syncope, facial asymmetry, speech difficulty, numbness and headaches.       Lightheaded if gets up too fast.  Psychiatric/Behavioral: Positive for confusion, decreased concentration, depression and dysphoric mood. Negative for agitation, behavioral problems, hallucinations, self-injury, sleep disturbance and suicidal ideas. The patient is nervous/anxious. The patient is not hyperactive.    Physical Exam  Depressive Symptoms: none now  (Hypo) Manic Symptoms:   None  Anxiety Symptoms: Excessive Worry:  Yes Panic Symptoms:  No Agoraphobia:  No Obsessive Compulsive: Yes  Symptoms: ordliness Specific Phobias:  Yes Social Anxiety:  No  Psychotic Symptoms:  None  PTSD Symptoms: Ever had a traumatic exposure:  Yes Had a traumatic exposure in the last month:  No No other features  Traumatic Brain Injury: No   Past Psychiatric History: Diagnosis: Depression  Hospitalizations: once [facility]  Outpatient Care: PCP  Substance Abuse Care: none  Self-Mutilation: none   Suicidal Attempts: none  Violent Behaviors: one episode of getting a gun, but now sees that that is not the way to handle her voices    Allergies: Allergies  Allergen Reactions  . Amoxicillin Hives    Sores in mouth  . Buprenorphine Hcl Itching    Can tolerate if necessary  . Morphine And Related Itching    Can tolerate if necessary  . Sulfonamide Derivatives Hives    Sores in mouth  . Valium [Diazepam] Itching    Can tolerate if necessary   Past Medical History:   Past Medical History:  Diagnosis Date  . Adenomatous polyp    x1  . ALLERGIC RHINITIS 12/20/2006  . Chronic anxiety   . Diverticulitis   . GERD (gastroesophageal reflux disease)    intermittent, treated with tums  . Hiatal hernia   . Hypothyroidism   . Insomnia   . Lumbago   . Osteoporosis 2007   declined  prolia  . Perforation of right tympanic membrane 09/03/2015   Chronic, seen by Dr Janace Hoard ENT   . Personal history of kidney stones    x1  . Postmenopausal atrophic vaginitis 2016   rec estrace cream - [place]  . Recurrent UTI   . Scoliosis   . Severe recurrent depression with psychosis ([place])   . Urinary retention   . Vitamin D deficiency    Surgical History: Past Surgical History:  Procedure Laterality Date  . ABDOMINAL HYSTERECTOMY  1979   ectopic pregnancy with one ovary removed  . APPENDECTOMY  1954  . CHOLECYSTECTOMY    . ESOPHAGOGASTRODUODENOSCOPY    . KNEE SURGERY Left 2012   L medial meniscal tear  . OOPHORECTOMY Right 1965   Ectopic pregnancy, removal of right ovary and tube- 1960  . ROTATOR CUFF REPAIR Right   . TONSILLECTOMY     History of Loss of Consciousness:  No Seizure History:  No Cardiac History:  No Allergies: Allergies  Allergen Reactions  . Amoxicillin Hives    Sores in mouth  . Buprenorphine Hcl Itching    Can tolerate if necessary  . Morphine And Related Itching    Can tolerate if necessary  . Sulfonamide Derivatives Hives    Sores in mouth  . Valium  [Diazepam] Itching    Can tolerate if necessary   Medical History: Past Medical History:  Diagnosis Date  . Adenomatous polyp    x1  . ALLERGIC RHINITIS 12/20/2006  . Chronic anxiety   . Diverticulitis   . GERD (gastroesophageal reflux disease)    intermittent, treated with tums  . Hiatal hernia   . Hypothyroidism   . Insomnia   . Lumbago   . Osteoporosis 2007   declined prolia  . Perforation of right tympanic membrane 09/03/2015   Chronic, seen by Dr Janace Hoard ENT   . Personal history of kidney stones    x1  . Postmenopausal atrophic vaginitis 2016   rec estrace cream - [place]  . Recurrent UTI   . Scoliosis   . Severe recurrent depression with psychosis ([place])   . Urinary retention   . Vitamin D deficiency    Surgical History: Past Surgical History:  Procedure Laterality Date  . ABDOMINAL HYSTERECTOMY  1979   ectopic pregnancy with one ovary removed  . APPENDECTOMY  1954  . CHOLECYSTECTOMY    . ESOPHAGOGASTRODUODENOSCOPY    . KNEE SURGERY Left 2012   L medial meniscal tear  . OOPHORECTOMY Right 1965   Ectopic pregnancy, removal of right ovary and tube- 1960  . ROTATOR CUFF REPAIR Right   . TONSILLECTOMY     Family History: family history includes Anxiety disorder in her grandchild; Cancer in her sister and sister; Depression in her grandchild; Diabetes in her brother, father, mother, and sister; Healthy in her sister; Schizophrenia in her sister. Reviewed and no changes noted.  Current Medications:  Current Outpatient Prescriptions  Medication Sig Dispense Refill  . benztropine (COGENTIN) 0.5 MG tablet TAKE 1 TABLET THREE TIMES A DAY AS NEEDED FOR TREMORS 180 tablet 2  . bisacodyl (DULCOLAX) 10 MG suppository Place 1 suppository (10 mg total) rectally daily as needed for moderate constipation. 12 suppository 0  . Calcium Carbonate-Vitamin D (CALCIUM-D PO) Take 1 tablet by mouth daily.    . clonazePAM (KLONOPIN) 1 MG tablet Take 1 tablet (1 mg total) by mouth at  bedtime. 90 tablet 2  . cyclobenzaprine (FLEXERIL) 10 MG tablet Take 10 mg by mouth 3 (  three) times daily as needed for muscle spasms.    Marland Kitchen dicyclomine (BENTYL) 10 MG capsule Take 10 mg by mouth daily as needed for spasms. For diverticulitis    . doxycycline (VIBRA-TABS) 100 MG tablet Take 1 tablet (100 mg total) by mouth 2 (two) times daily. 20 tablet 0  . fluticasone (FLONASE) 50 MCG/ACT nasal spray Place 2 sprays into both nostrils daily as needed for allergies or rhinitis. 16 g 3  . hydrocortisone (ANUSOL-HC) 25 MG suppository Place 1 suppository (25 mg total) rectally 2 (two) times daily as needed for hemorrhoids or itching. 12 suppository 0  . lamoTRIgine (LAMICTAL) 25 MG tablet Take 2 tablets (50 mg total) by mouth 2 (two) times daily. 360 tablet 2  . levothyroxine (SYNTHROID, LEVOTHROID) 88 MCG tablet TAKE 1 TABLET EVERY MORNING (NEED OFFICE VISIT AFTER ONE REFILL) 30 tablet 3  . montelukast (SINGULAIR) 10 MG tablet TAKE 1 TABLET AT BEDTIME 90 tablet 0  . PROAIR HFA 108 (90 Base) MCG/ACT inhaler USE 2 INHALATIONS EVERY 6 HOURS AS NEEDED FOR COUGH 25.5 g 1  . traMADol (ULTRAM) 50 MG tablet Take 50 mg by mouth every 6 (six) hours as needed for moderate pain.    Marland Kitchen venlafaxine (EFFEXOR) 37.5 MG tablet Take 1 tablet (37.5 mg total) by mouth 2 (two) times daily. 180 tablet 2  . Vitamin D, Ergocalciferol, (DRISDOL) 50000 UNITS CAPS capsule TAKE 1 CAPSULE EVERY 7 DAYS 12 capsule 2  . OLANZapine (ZYPREXA) 5 MG tablet Take one in the am and 2 at night 90 tablet 2   No current facility-administered medications for this visit.     Previous Psychotropic Medications:  Medication Dose   Klonopin     Lithium ?    Risperdal    Lamictal    Effexor    Trazodone    Substance Abuse History in the last 12 months: Substance Age of 1st Use Last Use Amount Specific Type  Nicotine  none        Alcohol  20  March 13      Cannabis  none        Opiates  none        Cocaine  none        Methamphetamines   none        LSD  none        Ecstasy  none         Benzodiazepines  [age]      Caffeine  childhood  this AM      Inhalants  none        Others:       sugar  childhood  this AM     Medical Consequences of Substance Abuse: none  Legal Consequences of Substance Abuse: none  Family Consequences of Substance Abuse: none  Social History: Current Place of Residence: [address] [place] [address] Place of Birth: [place], [place] Family Members: husband Marital Status:  Married Children: 1  Sons: 0  Daughters: 1 Relationships: husbandf Education:  Dentist Problems/Performance: good Religious Beliefs/Practices: [employer] History of Abuse: none Occupational Experiences: clerical, office, Architect History:  Press photographer History: none Hobbies/Interests: walking, reading, playing cards  Mental Status Examination/Evaluation: Objective:  Appearance: Casual neatly dressed   Eye Contact::  Good  Speech: Clear and coherent   Volume:  Normal  Mood:Very anxious   Affect:  Congruent  Thought Process States that her thoughts are racing   Thought  content: Hallucinations of music and voices have recurred     Suicidal Thoughts:  No  Homicidal Thoughts:  No  Judgement:  Good  Insight:  Good  Psychomotor Activity:  Normal  Akathisia:  No  Handed:  Right  AIMS (if indicated):    Assets:  Communication Skills Desire for Improvement   Lab Results:  Results for orders placed or performed in visit on 12/26/15 (from the past 8736 hour(s))  T4, free   Collection Time: 12/26/15  1:39 PM  Result Value Ref Range   Free T4 1.10 0.60 - 1.60 ng/dL  TSH   Collection Time: 12/26/15  1:39 PM  Result Value Ref Range   TSH 2.70 0.35 - 4.50 uIU/mL  VITAMIN D 25 Hydroxy (Vit-D Deficiency, Fractures)   Collection Time: 12/26/15  1:39 PM  Result Value Ref Range   VITD 35.70 30.00 - 100.00 ng/mL  Results for orders placed or performed in visit on  06/22/15 (from the past 8736 hour(s))  POCT Influenza A/B   Collection Time: 06/22/15  8:59 AM  Result Value Ref Range   Influenza A, POC Negative Negative   Influenza B, POC Negative Negative   Assessment:    AXIS I  major depression with psychotic features, rule out dementia   AXIS II Deferred  AXIS III Past Medical History:  Diagnosis Date  . Adenomatous polyp    x1  . ALLERGIC RHINITIS 12/20/2006  . Chronic anxiety   . Diverticulitis   . GERD (gastroesophageal reflux disease)    intermittent, treated with tums  . Hiatal hernia   . Hypothyroidism   . Insomnia   . Lumbago   . Osteoporosis 2007   declined prolia  . Perforation of right tympanic membrane 09/03/2015   Chronic, seen by Dr Janace Hoard ENT   . Personal history of kidney stones    x1  . Postmenopausal atrophic vaginitis 2016   rec estrace cream - [place]  . Recurrent UTI   . Scoliosis   . Severe recurrent depression with psychosis ([place])   . Urinary retention   . Vitamin D deficiency      AXIS IV economic problems and other psychosocial or environmental problems  AXIS V 61-70 mild symptoms   Treatment Plan/Recommendations:  Laboratory:    Psychotherapy: supportive  Medications: As below   Routine PRN Medications:  No  Consultations: none  Safety Concerns:  none  Other:     Plan/Discussion: I took her vitals.  I reviewed CC, tobacco/med/surg Hx, meds effects/ side effects, problem list, therapies and responses as well as current situation/symptoms discussed options.  She will continue Effexor for depression and Lamictal for mood stabilization. She will continue clonazepam to 1 mg at bedtime to help with sleep. She will Continue olanzapine but increase the dosage to 5 mg in the morning and 10 at bedtime She will call if hallucinations worsenShe will continue the Cogentin for tremor. She'll return to see me in 2 weeks See orders and pt instructions for more details.  MEDICATIONS this encounter: Meds ordered  this encounter  Medications  . OLANZapine (ZYPREXA) 5 MG tablet    Sig: Take one in the am and 2 at night    Dispense:  90 tablet    Refill:  2    Medical Decision Making Problem Points:  Established problem, stable/improving (1), New problem, with no additional work-up planned (3), Review of last therapy session (1) and Review of psycho-social stressors (1) Data Points:  Review or order clinical  lab tests (1) Review of medication regiment & side effects (2) Review of new medications or change in dosage (2)  four-week's I certify that outpatient services furnished can reasonably be expected to improve the patient's condition.   Levonne Spiller, MD   Patient ID: Jamie Pitts, female   DOB: 07/25/1933, 83 y.o.   MRN: [id number]

## 2016-05-15 DIAGNOSIS — M5134 Other intervertebral disc degeneration, thoracic region: Secondary | ICD-10-CM | POA: Diagnosis not present

## 2016-05-15 DIAGNOSIS — M546 Pain in thoracic spine: Secondary | ICD-10-CM | POA: Diagnosis not present

## 2016-05-15 DIAGNOSIS — M5136 Other intervertebral disc degeneration, lumbar region: Secondary | ICD-10-CM | POA: Diagnosis not present

## 2016-05-15 DIAGNOSIS — M47816 Spondylosis without myelopathy or radiculopathy, lumbar region: Secondary | ICD-10-CM | POA: Diagnosis not present

## 2016-05-20 ENCOUNTER — Encounter (HOSPITAL_COMMUNITY): Payer: Self-pay | Admitting: Psychiatry

## 2016-05-20 ENCOUNTER — Ambulatory Visit (INDEPENDENT_AMBULATORY_CARE_PROVIDER_SITE_OTHER): Payer: Medicare Other | Admitting: Psychiatry

## 2016-05-20 VITALS — BP 154/70 | HR 109 | Resp 18 | Wt 175.2 lb

## 2016-05-20 DIAGNOSIS — F333 Major depressive disorder, recurrent, severe with psychotic symptoms: Secondary | ICD-10-CM

## 2016-05-20 DIAGNOSIS — Z818 Family history of other mental and behavioral disorders: Secondary | ICD-10-CM

## 2016-05-20 DIAGNOSIS — Z882 Allergy status to sulfonamides status: Secondary | ICD-10-CM | POA: Diagnosis not present

## 2016-05-20 DIAGNOSIS — Z79899 Other long term (current) drug therapy: Secondary | ICD-10-CM | POA: Diagnosis not present

## 2016-05-20 DIAGNOSIS — F341 Dysthymic disorder: Secondary | ICD-10-CM

## 2016-05-20 DIAGNOSIS — Z888 Allergy status to other drugs, medicaments and biological substances status: Secondary | ICD-10-CM | POA: Diagnosis not present

## 2016-05-20 DIAGNOSIS — F29 Unspecified psychosis not due to a substance or known physiological condition: Secondary | ICD-10-CM

## 2016-05-20 MED ORDER — OLANZAPINE 5 MG PO TABS: ORAL_TABLET | ORAL | 2 refills | Status: DC

## 2016-05-20 NOTE — Progress Notes (Signed)
Patient ID: Jamie Pitts, female   DOB: Sep 21, 1933, 83 y.o.   MRN: [id number] Patient ID: Jamie Pitts, female   DOB: 10-Mar-1933, 83 y.o.   MRN: [id number] Patient ID: Jamie Pitts, female   DOB: 03/04/1933, 83 y.o.   MRN: [id number] Patient ID: Jamie Pitts, female   DOB: Nov 20, 1933, 83 y.o.   MRN: [id number] Patient ID: Jamie Pitts, female   DOB: 28-Mar-1933, 83 y.o.   MRN: [id number] Patient ID: Jamie Pitts, female   DOB: 1933-06-11, 83 y.o.   MRN: [id number] Patient ID: Jamie Pitts, female   DOB: 02-05-1934, 83 y.o.   MRN: [id number] Patient ID: Jamie Pitts, female   DOB: 05-21-1933, 83 y.o.   MRN: [id number] Patient ID: Jamie Pitts, female   DOB: 07-24-1933, 83 y.o.   MRN: [id number] Patient ID: Jamie Pitts, female   DOB: 03-16-1933, 83 y.o.   MRN: [id number] Patient ID: Jamie Pitts, female   DOB: 04-May-1933, 83 y.o.   MRN: [id number] Patient ID: Jamie Pitts, female   DOB: [birth year], 83 y.o.   MRN: [id number] [facility] [id number] Progress Note Jamie Pitts MRN: [id number] DOB: 10-25-1933 Age: 83 y.o.  Date: 05/20/2016   Chief Complaint: Chief Complaint  Patient presents with  . Schizophrenia  . Follow-up   Subjective: "The voices are back"  This patient is a 83 year old married white female who lives with her husband in [place]. She has one daughter and 3 grandchildren. She is retired from the [employer].  Apparently the patient had some sort of psychotic episode in 2013. She was admitted to [facility] because she was hearing voices at home. The voices got so bad that she cannot put a gun under the bed and wanted to shoot him. The patient is a poor historian and is hard of hearing and gets easily confused. She was placed on various medicines in the hospital which have been continued. It's unclear if she is medication compliant. For example she's not taking the Cogentin. She repeats herself a lot.  The patient states she still hearing  voices every day. They're not frightening her like they were before but there seemed to her and she can't always get them to stop. She sleeping pretty well she denies being depressed but she is obviously not thinking clearly. She mentions that she's been diagnosed with parkinsonism but she doesn't have a tremor.  The patient returns after 3 weeks. Last time she was combining olanzapine and Risperdal and I explained to her that this is not safe. I told her to stop the Risperdal and increased olanzapine to 5 mg in the morning and 10 at night. For the first week she's continued to hear voices but now it has subsided and she only hears music. She will hear a church him in her head over and over and she finds this maddening. At least she is no longer hearing voices telling her to harm self or others. She also states she is very stressed about some news she got from [place] regarding her back. She thinks that her spine is going to "curl up and work its way all the way back to my brain". I looked at her last x-ray from [facility] which was done a year ago shows lumbar scoliosis and osteoarthritic changes. I told her we would get the records from [place] and we can understand what is really going on here   History of Chief Complaint:   At age 29 pt went to a "fat farm" to fatten her  up.  She remembers her father paid more attention to her than her mother did.  She experienced some anxiety when she moved [place].  She was in [employer] for 2 years, then she was the Glass blower/designer for [employer] and then got into [employer].  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  She noted feeling very home sick to go back to [place] her home place in 2010.  Her sister had been very nervous and she was beginning to feel anxious and was started on Klonopin.  ISince then she noted songs for 3 or 4 years.  Then in October of 2013, she started hearing voices non stop and she lost 30 pounds. This prompted  her to get a gun and put it under the bed and the family got concerned.  She was admitted to [facility] on January 24th.  She was stopped from the Klonopin and Lithium (pt doesn't understand how she got on that) and placed on Ripserdal, Trazodone, Lamictal, and Effexor with pretty good results, except she has very dry mouth at nigh  She has noted blurred vision since starting the meds.  She is getting new glasses for herself.  Depression         Associated symptoms include decreased concentration.  Associated symptoms include no headaches and no suicidal ideas.  Past medical history includes anxiety.   Anxiety  Symptoms include confusion, decreased concentration and nervous/anxious behavior. Patient reports no dizziness or suicidal ideas.     Review of Systems  HENT: Negative.   Gastrointestinal: Negative.   Genitourinary: Negative.   Neurological: Positive for weakness and light-headedness. Negative for dizziness, tremors, seizures, syncope, facial asymmetry, speech difficulty, numbness and headaches.       Lightheaded if gets up too fast.  Psychiatric/Behavioral: Positive for confusion, decreased concentration, depression and dysphoric mood. Negative for agitation, behavioral problems, hallucinations, self-injury, sleep disturbance and suicidal ideas. The patient is nervous/anxious. The patient is not hyperactive.    Physical Exam  Depressive Symptoms: none now  (Hypo) Manic Symptoms:   None  Anxiety Symptoms: Excessive Worry:  Yes Panic Symptoms:  No Agoraphobia:  No Obsessive Compulsive: Yes  Symptoms: ordliness Specific Phobias:  Yes Social Anxiety:  No  Psychotic Symptoms:  None  PTSD Symptoms: Ever had a traumatic exposure:  Yes Had a traumatic exposure in the last month:  No No other features  Traumatic Brain Injury: No   Past Psychiatric History: Diagnosis: Depression  Hospitalizations: once [facility]  Outpatient Care: PCP  Substance Abuse Care:  none  Self-Mutilation: none  Suicidal Attempts: none  Violent Behaviors: one episode of getting a gun, but now sees that that is not the way to handle her voices    Allergies: Allergies  Allergen Reactions  . Amoxicillin Hives    Sores in mouth  . Buprenorphine Hcl Itching    Can tolerate if necessary  . Morphine And Related Itching    Can tolerate if necessary  . Sulfonamide Derivatives Hives    Sores in mouth  . Valium [Diazepam] Itching    Can tolerate if necessary   Past Medical History:   Past Medical History:  Diagnosis Date  . Adenomatous polyp    x1  . ALLERGIC RHINITIS 12/20/2006  . Chronic anxiety   . Diverticulitis   . GERD (gastroesophageal reflux disease)    intermittent, treated with tums  . Hiatal hernia   . Hypothyroidism   . Insomnia   . Lumbago   .  Osteoporosis 2007   declined prolia  . Perforation of right tympanic membrane 09/03/2015   Chronic, seen by Dr Janace Hoard ENT   . Personal history of kidney stones    x1  . Postmenopausal atrophic vaginitis 2016   rec estrace cream - [place]  . Recurrent UTI   . Scoliosis   . Severe recurrent depression with psychosis ([place])   . Urinary retention   . Vitamin D deficiency    Surgical History: Past Surgical History:  Procedure Laterality Date  . ABDOMINAL HYSTERECTOMY  1979   ectopic pregnancy with one ovary removed  . APPENDECTOMY  1954  . CHOLECYSTECTOMY    . ESOPHAGOGASTRODUODENOSCOPY    . KNEE SURGERY Left 2012   L medial meniscal tear  . OOPHORECTOMY Right 1965   Ectopic pregnancy, removal of right ovary and tube- 1960  . ROTATOR CUFF REPAIR Right   . TONSILLECTOMY     History of Loss of Consciousness:  No Seizure History:  No Cardiac History:  No Allergies: Allergies  Allergen Reactions  . Amoxicillin Hives    Sores in mouth  . Buprenorphine Hcl Itching    Can tolerate if necessary  . Morphine And Related Itching    Can tolerate if necessary  . Sulfonamide Derivatives Hives    Sores  in mouth  . Valium [Diazepam] Itching    Can tolerate if necessary   Medical History: Past Medical History:  Diagnosis Date  . Adenomatous polyp    x1  . ALLERGIC RHINITIS 12/20/2006  . Chronic anxiety   . Diverticulitis   . GERD (gastroesophageal reflux disease)    intermittent, treated with tums  . Hiatal hernia   . Hypothyroidism   . Insomnia   . Lumbago   . Osteoporosis 2007   declined prolia  . Perforation of right tympanic membrane 09/03/2015   Chronic, seen by Dr Janace Hoard ENT   . Personal history of kidney stones    x1  . Postmenopausal atrophic vaginitis 2016   rec estrace cream - [place]  . Recurrent UTI   . Scoliosis   . Severe recurrent depression with psychosis ([place])   . Urinary retention   . Vitamin D deficiency    Surgical History: Past Surgical History:  Procedure Laterality Date  . ABDOMINAL HYSTERECTOMY  1979   ectopic pregnancy with one ovary removed  . APPENDECTOMY  1954  . CHOLECYSTECTOMY    . ESOPHAGOGASTRODUODENOSCOPY    . KNEE SURGERY Left 2012   L medial meniscal tear  . OOPHORECTOMY Right 1965   Ectopic pregnancy, removal of right ovary and tube- 1960  . ROTATOR CUFF REPAIR Right   . TONSILLECTOMY     Family History: family history includes Anxiety disorder in her grandchild; Cancer in her sister and sister; Depression in her grandchild; Diabetes in her brother, father, mother, and sister; Healthy in her sister; Schizophrenia in her sister. Reviewed and no changes noted.  Current Medications:  Current Outpatient Prescriptions  Medication Sig Dispense Refill  . benztropine (COGENTIN) 0.5 MG tablet TAKE 1 TABLET THREE TIMES A DAY AS NEEDED FOR TREMORS 180 tablet 2  . bisacodyl (DULCOLAX) 10 MG suppository Place 1 suppository (10 mg total) rectally daily as needed for moderate constipation. 12 suppository 0  . Calcium Carbonate-Vitamin D (CALCIUM-D PO) Take 1 tablet by mouth daily.    . clonazePAM (KLONOPIN) 1 MG tablet Take 1 tablet (1 mg  total) by mouth at bedtime. 90 tablet 2  . cyclobenzaprine (FLEXERIL) 10 MG tablet Take  10 mg by mouth 3 (three) times daily as needed for muscle spasms.    Marland Kitchen dicyclomine (BENTYL) 10 MG capsule Take 10 mg by mouth daily as needed for spasms. For diverticulitis    . doxycycline (VIBRA-TABS) 100 MG tablet Take 1 tablet (100 mg total) by mouth 2 (two) times daily. 20 tablet 0  . fluticasone (FLONASE) 50 MCG/ACT nasal spray Place 2 sprays into both nostrils daily as needed for allergies or rhinitis. 16 g 3  . hydrocortisone (ANUSOL-HC) 25 MG suppository Place 1 suppository (25 mg total) rectally 2 (two) times daily as needed for hemorrhoids or itching. 12 suppository 0  . lamoTRIgine (LAMICTAL) 25 MG tablet Take 2 tablets (50 mg total) by mouth 2 (two) times daily. 360 tablet 2  . levothyroxine (SYNTHROID, LEVOTHROID) 88 MCG tablet TAKE 1 TABLET EVERY MORNING (NEED OFFICE VISIT AFTER ONE REFILL) 30 tablet 3  . montelukast (SINGULAIR) 10 MG tablet TAKE 1 TABLET AT BEDTIME 90 tablet 0  . OLANZapine (ZYPREXA) 5 MG tablet Take one in the am , one at noon and two at bedtime 360 tablet 2  . PROAIR HFA 108 (90 Base) MCG/ACT inhaler USE 2 INHALATIONS EVERY 6 HOURS AS NEEDED FOR COUGH 25.5 g 1  . traMADol (ULTRAM) 50 MG tablet Take 50 mg by mouth every 6 (six) hours as needed for moderate pain.    Marland Kitchen venlafaxine (EFFEXOR) 37.5 MG tablet Take 1 tablet (37.5 mg total) by mouth 2 (two) times daily. 180 tablet 2  . Vitamin D, Ergocalciferol, (DRISDOL) 50000 UNITS CAPS capsule TAKE 1 CAPSULE EVERY 7 DAYS 12 capsule 2   No current facility-administered medications for this visit.     Previous Psychotropic Medications:  Medication Dose   Klonopin     Lithium ?    Risperdal    Lamictal    Effexor    Trazodone    Substance Abuse History in the last 12 months: Substance Age of 1st Use Last Use Amount Specific Type  Nicotine  none        Alcohol  20  March 13      Cannabis  none        Opiates  none         Cocaine  none        Methamphetamines  none        LSD  none        Ecstasy  none         Benzodiazepines  [age]      Caffeine  childhood  this AM      Inhalants  none        Others:       sugar  childhood  this AM     Medical Consequences of Substance Abuse: none  Legal Consequences of Substance Abuse: none  Family Consequences of Substance Abuse: none  Social History: Current Place of Residence: [address] [place] [address] Place of Birth: [place], [place] Family Members: husband Marital Status:  Married Children: 1  Sons: 0  Daughters: 1 Relationships: husbandf Education:  Dentist Problems/Performance: good Religious Beliefs/Practices: [employer] History of Abuse: none Occupational Experiences: clerical, office, Architect History:  Press photographer History: none Hobbies/Interests: walking, reading, playing cards  Mental Status Examination/Evaluation: Objective:  Appearance: Casual neatly dressed   Eye Contact::  Good  Speech: Clear and coherent   Volume:  Normal  Mood:Fairly good, less anxious   Affect:  Congruent  Thought Process States that herThoughts have slowed down somewhat   Thought content: Hallucinations of music And 10 use, but the voices have stopped     Suicidal Thoughts:  No  Homicidal Thoughts:  No  Judgement:  Good  Insight:  Good  Psychomotor Activity:  Normal  Akathisia:  No  Handed:  Right  AIMS (if indicated):    Assets:  Communication Skills Desire for Improvement   Lab Results:  Results for orders placed or performed in visit on 12/26/15 (from the past 8736 hour(s))  T4, free   Collection Time: 12/26/15  1:39 PM  Result Value Ref Range   Free T4 1.10 0.60 - 1.60 ng/dL  TSH   Collection Time: 12/26/15  1:39 PM  Result Value Ref Range   TSH 2.70 0.35 - 4.50 uIU/mL  VITAMIN D 25 Hydroxy (Vit-D Deficiency, Fractures)   Collection Time: 12/26/15  1:39 PM  Result Value Ref Range   VITD  35.70 30.00 - 100.00 ng/mL  Results for orders placed or performed in visit on 06/22/15 (from the past 8736 hour(s))  POCT Influenza A/B   Collection Time: 06/22/15  8:59 AM  Result Value Ref Range   Influenza A, POC Negative Negative   Influenza B, POC Negative Negative   Assessment:    AXIS I  major depression with psychotic features, rule out dementia   AXIS II Deferred  AXIS III Past Medical History:  Diagnosis Date  . Adenomatous polyp    x1  . ALLERGIC RHINITIS 12/20/2006  . Chronic anxiety   . Diverticulitis   . GERD (gastroesophageal reflux disease)    intermittent, treated with tums  . Hiatal hernia   . Hypothyroidism   . Insomnia   . Lumbago   . Osteoporosis 2007   declined prolia  . Perforation of right tympanic membrane 09/03/2015   Chronic, seen by Dr Janace Hoard ENT   . Personal history of kidney stones    x1  . Postmenopausal atrophic vaginitis 2016   rec estrace cream - [place]  . Recurrent UTI   . Scoliosis   . Severe recurrent depression with psychosis ([place])   . Urinary retention   . Vitamin D deficiency      AXIS IV economic problems and other psychosocial or environmental problems  AXIS V 61-70 mild symptoms   Treatment Plan/Recommendations:  Laboratory:    Psychotherapy: supportive  Medications: As below   Routine PRN Medications:  No  Consultations: none  Safety Concerns:  none  Other:     Plan/Discussion: I took her vitals.  I reviewed CC, tobacco/med/surg Hx, meds effects/ side effects, problem list, therapies and responses as well as current situation/symptoms discussed options.  She will continue Effexor for depression and Lamictal for mood stabilization. She will continue clonazepam to 1 mg at bedtime to help with sleep. She will Continue olanzapine but increase the dosage to 5 mg in the morningAnd 5 mg noon and 10 at bedtime She will call if hallucinations worsenShe will continue the Cogentin for tremor. She'll return to see me in 3  weeks See orders and pt instructions for more details.  MEDICATIONS this encounter: Meds ordered this encounter  Medications  . DISCONTD: OLANZapine (ZYPREXA) 5 MG tablet    Sig: Take one in the am , one at noon and two at bedtime    Dispense:  120 tablet    Refill:  2  . OLANZapine (ZYPREXA) 5 MG tablet    Sig: Take one in the am ,  one at noon and two at bedtime    Dispense:  360 tablet    Refill:  2    Medical Decision Making Problem Points:  Established problem, stable/improving (1), New problem, with no additional work-up planned (3), Review of last therapy session (1) and Review of psycho-social stressors (1) Data Points:  Review or order clinical lab tests (1) Review of medication regiment & side effects (2) Review of new medications or change in dosage (2)  four-week's I certify that outpatient services furnished can reasonably be expected to improve the patient's condition.   Levonne Spiller, MD   Patient ID: Jamie Pitts, female   DOB: 1933-07-07, 83 y.o.   MRN: [id number] Patient ID: Jamie Pitts, female   DOB: [birth year], 83 y.o.   MRN: [id number]

## 2016-05-24 ENCOUNTER — Encounter: Payer: Self-pay | Admitting: Family Medicine

## 2016-05-24 ENCOUNTER — Ambulatory Visit (INDEPENDENT_AMBULATORY_CARE_PROVIDER_SITE_OTHER): Payer: Medicare Other | Admitting: Family Medicine

## 2016-05-24 VITALS — BP 118/70 | HR 88 | Temp 98.2°F | Wt 180.5 lb

## 2016-05-24 DIAGNOSIS — Z23 Encounter for immunization: Secondary | ICD-10-CM | POA: Diagnosis not present

## 2016-05-24 DIAGNOSIS — E039 Hypothyroidism, unspecified: Secondary | ICD-10-CM

## 2016-05-24 DIAGNOSIS — G8929 Other chronic pain: Secondary | ICD-10-CM | POA: Diagnosis not present

## 2016-05-24 DIAGNOSIS — F333 Major depressive disorder, recurrent, severe with psychotic symptoms: Secondary | ICD-10-CM | POA: Diagnosis not present

## 2016-05-24 DIAGNOSIS — J449 Chronic obstructive pulmonary disease, unspecified: Secondary | ICD-10-CM | POA: Diagnosis not present

## 2016-05-24 DIAGNOSIS — R109 Unspecified abdominal pain: Secondary | ICD-10-CM | POA: Diagnosis not present

## 2016-05-24 DIAGNOSIS — M545 Low back pain: Secondary | ICD-10-CM

## 2016-05-24 DIAGNOSIS — K219 Gastro-esophageal reflux disease without esophagitis: Secondary | ICD-10-CM | POA: Diagnosis not present

## 2016-05-24 MED ORDER — DICYCLOMINE HCL 10 MG PO CAPS: 10.0000 mg | ORAL_CAPSULE | Freq: Every day | ORAL | 1 refills | Status: DC | PRN

## 2016-05-24 MED ORDER — FLUTICASONE PROPIONATE 50 MCG/ACT NA SUSP: 2.0000 | Freq: Every day | NASAL | 3 refills | Status: DC | PRN

## 2016-05-24 NOTE — Assessment & Plan Note (Signed)
Will review at next OV.

## 2016-05-24 NOTE — Assessment & Plan Note (Signed)
?  IBS - requests dicyclomine refilled to mail order.

## 2016-05-24 NOTE — Assessment & Plan Note (Signed)
Stable off PPI. asxs.

## 2016-05-24 NOTE — Patient Instructions (Addendum)
prevnar today (final pneumonia shot) Sign release for records from Dr Nelva Bush' last office visit.  Return at your convenience in 3-6 months for medicare wellness visit

## 2016-05-24 NOTE — Assessment & Plan Note (Signed)
Appreciate psych care. Reviewed current psychotropic regimen. Voices have improved, continues hearing music.

## 2016-05-24 NOTE — Assessment & Plan Note (Addendum)
Followed by endo. Not recently seen. TFTs WNL 12/2015

## 2016-05-24 NOTE — Progress Notes (Signed)
BP 118/70   Pulse 88   Temp 98.2 F (36.8 C) (Oral)   Wt 180 lb 8 oz (81.9 kg)   LMP 07/15/2012   BMI 30.04 kg/m    CC: f/u visit Subjective:    Patient ID: Jamie Pitts, female    DOB: Apr 10, 1933, 83 y.o.   MRN: [id number]  HPI: Jamie Pitts is a 82 y.o. female presenting on 05/24/2016 for Follow-up (patient said you needed to talk to her and she needed to talk to you)   Depression with psychosis vs dementia - sees Dr Harrington Challenger psychiatry on effexor and lamictal. She also takes klonopin 1mg  nightly for sleep, cogentin for tremor, and olanzapine 06/22/08 daily.   Sees Dr Nelva Bush for chronic lower back pain - this is patient's main concern today. S/p several lumbar facet injections and latest L SIJ injection. Tramadol ineffective for pain, now using norco.  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  Unclear what is really going on - we will request latest OV from pain management (last note I have is from 10/2015).   She would like refill of dicyclomine which is helpful for abd pain.  She states she is not taking cogentin "I don't need it anymore" Denies significant GERD or heartburn.   Feels memory is doing well.  She lives at home with husband.   Relevant past medical, surgical, family and social history reviewed and updated as indicated. Interim medical history since our last visit reviewed. Allergies and medications reviewed and updated. Outpatient Medications Prior to Visit  Medication Sig Dispense Refill  . bisacodyl (DULCOLAX) 10 MG suppository Place 1 suppository (10 mg total) rectally daily as needed for moderate constipation. 12 suppository 0  . Calcium Carbonate-Vitamin D (CALCIUM-D PO) Take 1 tablet by mouth daily.    . clonazePAM (KLONOPIN) 1 MG tablet Take 1 tablet (1 mg total) by mouth at bedtime. 90 tablet 2  . hydrocortisone (ANUSOL-HC) 25 MG suppository Place 1 suppository (25 mg total) rectally 2 (two) times daily  as needed for hemorrhoids or itching. 12 suppository 0  . lamoTRIgine (LAMICTAL) 25 MG tablet Take 2 tablets (50 mg total) by mouth 2 (two) times daily. 360 tablet 2  . levothyroxine (SYNTHROID, LEVOTHROID) 88 MCG tablet TAKE 1 TABLET EVERY MORNING (NEED OFFICE VISIT AFTER ONE REFILL) 30 tablet 3  . montelukast (SINGULAIR) 10 MG tablet TAKE 1 TABLET AT BEDTIME 90 tablet 0  . OLANZapine (ZYPREXA) 5 MG tablet Take one in the am , one at noon and two at bedtime 360 tablet 2  . PROAIR HFA 108 (90 Base) MCG/ACT inhaler USE 2 INHALATIONS EVERY 6 HOURS AS NEEDED FOR COUGH 25.5 g 1  . venlafaxine (EFFEXOR) 37.5 MG tablet Take 1 tablet (37.5 mg total) by mouth 2 (two) times daily. 180 tablet 2  . Vitamin D, Ergocalciferol, (DRISDOL) 50000 UNITS CAPS capsule TAKE 1 CAPSULE EVERY 7 DAYS 12 capsule 2  . dicyclomine (BENTYL) 10 MG capsule Take 10 mg by mouth daily as needed for spasms. For diverticulitis    . fluticasone (FLONASE) 50 MCG/ACT nasal spray Place 2 sprays into both nostrils daily as needed for allergies or rhinitis. 16 g 3  . traMADol (ULTRAM) 50 MG tablet Take 50 mg by mouth every 6 (six) hours as needed for moderate pain.    . benztropine (COGENTIN) 0.5 MG tablet TAKE 1 TABLET THREE TIMES A DAY AS NEEDED FOR TREMORS (Patient not  taking: Reported on 05/24/2016) 180 tablet 2  . cyclobenzaprine (FLEXERIL) 10 MG tablet Take 10 mg by mouth 3 (three) times daily as needed for muscle spasms.    Marland Kitchen doxycycline (VIBRA-TABS) 100 MG tablet Take 1 tablet (100 mg total) by mouth 2 (two) times daily. 20 tablet 0   No facility-administered medications prior to visit.      Per HPI unless specifically indicated in ROS section below Review of Systems     Objective:    BP 118/70   Pulse 88   Temp 98.2 F (36.8 C) (Oral)   Wt 180 lb 8 oz (81.9 kg)   LMP 07/15/2012   BMI 30.04 kg/m   Wt Readings from Last 3 Encounters:  05/24/16 180 lb 8 oz (81.9 kg)  05/20/16 175 lb 3.2 oz (79.5 kg)  05/09/16 172 lb  3.2 oz (78.1 kg)    Physical Exam  Constitutional: She appears well-developed and well-nourished. No distress.  HENT:  Mouth/Throat: Oropharynx is clear and moist. No oropharyngeal exudate.  Cardiovascular: Normal rate, regular rhythm, normal heart sounds and intact distal pulses.   No murmur heard. Pulmonary/Chest: Effort normal and breath sounds normal. No respiratory distress. She has no wheezes. She has no rales.  Musculoskeletal: She exhibits no edema.  Skin: Skin is warm and dry. No rash noted.  Psychiatric: She has a normal mood and affect.  Nursing note and vitals reviewed.  Results for orders placed or performed in visit on 12/26/15  T4, free  Result Value Ref Range   Free T4 1.10 0.60 - 1.60 ng/dL  TSH  Result Value Ref Range   TSH 2.70 0.35 - 4.50 uIU/mL  VITAMIN D 25 Hydroxy (Vit-D Deficiency, Fractures)  Result Value Ref Range   VITD 35.70 30.00 - 100.00 ng/mL      Assessment & Plan:   Problem List Items Addressed This Visit    Abdominal cramping    ?IBS - requests dicyclomine refilled to mail order.      Chronic back pain - Primary    Pt very worried about this. Her story doesn't make much sense. Now on hydrocodone 3-4 a day for pain control. Reviewed our latest note in our system from PM&R 10/2015 - lumbar ESI with success, planned L SIJ injection. Will request latest OV from Dr Nelva Bush and review. Pt agrees.       Relevant Medications   HYDROcodone-acetaminophen (NORCO/VICODIN) 5-325 MG tablet   COPD (chronic obstructive pulmonary disease) ([place])    Will review at next OV.       Relevant Medications   fluticasone (FLONASE) 50 MCG/ACT nasal spray   Depression, major, recurrent, severe with psychosis ([place])    Appreciate psych care. Reviewed current psychotropic regimen. Voices have improved, continues hearing music.       GERD    Stable off PPI. asxs.       Relevant Medications   dicyclomine (BENTYL) 10 MG capsule   Hypothyroidism    Followed by endo.  Not recently seen. TFTs WNL 12/2015        Other Visit Diagnoses    Need for vaccination with 13-polyvalent pneumococcal conjugate vaccine       Relevant Orders   Pneumococcal conjugate vaccine 13-valent (Completed)       Follow up plan: Return in about 6 months (around 11/23/2016) for medicare wellness visit.  Ria Bush, MD

## 2016-05-24 NOTE — Assessment & Plan Note (Signed)
Pt very worried about this. Her story doesn't make much sense. Now on hydrocodone 3-4 a day for pain control. Reviewed our latest note in our system from PM&R 10/2015 - lumbar ESI with success, planned L SIJ injection. Will request latest OV from Dr Nelva Bush and review. Pt agrees.

## 2016-05-24 NOTE — Progress Notes (Signed)
Pre visit review using our clinic review tool, if applicable. No additional management support is needed unless otherwise documented below in the visit note. 

## 2016-05-27 ENCOUNTER — Other Ambulatory Visit: Payer: Self-pay

## 2016-05-27 ENCOUNTER — Telehealth: Payer: Self-pay | Admitting: Endocrinology

## 2016-05-27 MED ORDER — LEVOTHYROXINE SODIUM 88 MCG PO TABS: ORAL_TABLET | ORAL | 2 refills | Status: DC

## 2016-05-27 NOTE — Telephone Encounter (Signed)
Pt needs [place] to [facility] in [facility]

## 2016-05-27 NOTE — Telephone Encounter (Signed)
Ordered

## 2016-06-10 ENCOUNTER — Encounter (HOSPITAL_COMMUNITY): Payer: Self-pay | Admitting: Psychiatry

## 2016-06-10 ENCOUNTER — Ambulatory Visit (INDEPENDENT_AMBULATORY_CARE_PROVIDER_SITE_OTHER): Payer: Medicare Other | Admitting: Psychiatry

## 2016-06-10 VITALS — BP 126/80 | HR 91 | Ht 65.0 in | Wt 178.4 lb

## 2016-06-10 DIAGNOSIS — F29 Unspecified psychosis not due to a substance or known physiological condition: Secondary | ICD-10-CM

## 2016-06-10 DIAGNOSIS — Z818 Family history of other mental and behavioral disorders: Secondary | ICD-10-CM | POA: Diagnosis not present

## 2016-06-10 DIAGNOSIS — Z79899 Other long term (current) drug therapy: Secondary | ICD-10-CM

## 2016-06-10 DIAGNOSIS — F341 Dysthymic disorder: Secondary | ICD-10-CM

## 2016-06-10 MED ORDER — CLONAZEPAM 1 MG PO TABS: 1.0000 mg | ORAL_TABLET | Freq: Every day | ORAL | 2 refills | Status: DC

## 2016-06-10 NOTE — Progress Notes (Signed)
Patient ID: Jamie Pitts, female   DOB: Dec 13, 1933, 83 y.o.   MRN: [id number] Patient ID: Jamie Pitts, female   DOB: [birth year], 83 y.o.   MRN: [id number] Patient ID: Jamie Pitts, female   DOB: 1934/01/10, 83 y.o.   MRN: [id number] Patient ID: Jamie Pitts, female   DOB: 04/29/1933, 83 y.o.   MRN: [id number] Patient ID: Jamie Pitts, female   DOB: Jun 01, 1933, 83 y.o.   MRN: [id number] Patient ID: Jamie Pitts, female   DOB: May 26, 1933, 83 y.o.   MRN: [id number] Patient ID: Jamie Pitts, female   DOB: 27-Nov-1933, 83 y.o.   MRN: [id number] Patient ID: Jamie Pitts, female   DOB: 10-Oct-1933, 83 y.o.   MRN: [id number] Patient ID: Jamie Pitts, female   DOB: October 31, 1933, 83 y.o.   MRN: [id number] Patient ID: Jamie Pitts, female   DOB: 09-Feb-1934, 83 y.o.   MRN: [id number] Patient ID: Jamie Pitts, female   DOB: 05-12-1933, 83 y.o.   MRN: [id number] Patient ID: Jamie Pitts, female   DOB: Oct 29, 1933, 83 y.o.   MRN: [id number] [facility] [id number] Progress Note Jamie Pitts MRN: [id number] DOB: Aug 27, 1933 Age: 83 y.o.  Date: 06/10/2016   Chief Complaint: Chief Complaint  Patient presents with  . Hallucinations  . Anxiety  . Depression  . Follow-up   Subjective: "I just hear a little music"  This patient is a 83 year old married white female who lives with her husband in [place]. She has one daughter and 3 grandchildren. She is retired from the [employer].  Apparently the patient had some sort of psychotic episode in 2013. She was admitted to [facility] because she was hearing voices at home. The voices got so bad that she cannot put a gun under the bed and wanted to shoot him. The patient is a poor historian and is hard of hearing and gets easily confused. She was placed on various medicines in the hospital which have been continued. It's unclear if she is medication compliant. For example she's not taking the Cogentin. She repeats herself a  lot.  The patient states she still hearing voices every day. They're not frightening her like they were before but there seemed to her and she can't always get them to stop. She sleeping pretty well she denies being depressed but she is obviously not thinking clearly. She mentions that she's been diagnosed with parkinsonism but she doesn't have a tremor.  The patient returns after 3 weeks. Last time she was here she was complaining of hearing music that when stop. I increased her Zyprexa to 5 mg twice a day and 10 mg at bedtime. She states that she still hears some faint music but it's subsiding. It's not bothering her and she no longer hears any derogatory voices. Her mood is stable and she denies being anxious or depressed and she is sleeping well at night. She denies any suicidal ideation. She denies any tremor and is in fact stopped taking the Cogentin. 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 I think she misunderstood her orthopedic and her family doctors trying to clarify this as well   History of Chief Complaint:   At age 56 pt went to a "fat farm" to fatten her up.  She remembers her father paid more attention to her than her mother did.  She experienced some anxiety when she moved [place].  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  She noted feeling very home sick to go back to [place] her home place in 2010.  Her sister had been very nervous and she was beginning to feel anxious and was started on Klonopin.  ISince then she noted songs for 3 or 4 years.  Then in October of 2013, she started hearing voices non stop and she lost 30 pounds. This prompted her to get a gun and put it under the bed and the family got concerned.  She was admitted to [facility] on  January 24th.  She was stopped from the Klonopin and Lithium (pt doesn't understand how she got on that) and placed on Ripserdal, Trazodone, Lamictal, and Effexor with pretty good results, except she has very dry mouth at nigh  She has noted blurred vision since starting the meds.  She is getting new glasses for herself.  Depression         Associated symptoms include decreased concentration.  Associated symptoms include no headaches and no suicidal ideas.  Past medical history includes anxiety.   Anxiety  Symptoms include confusion, decreased concentration and nervous/anxious behavior. Patient reports no dizziness or suicidal ideas.     Review of Systems  HENT: Negative.   Gastrointestinal: Negative.   Genitourinary: Negative.   Neurological: Positive for weakness and light-headedness. Negative for dizziness, tremors, seizures, syncope, facial asymmetry, speech difficulty, numbness and headaches.       Lightheaded if gets up too fast.  Psychiatric/Behavioral: Positive for confusion, decreased concentration, depression and dysphoric mood. Negative for agitation, behavioral problems, hallucinations, self-injury, sleep disturbance and suicidal ideas. The patient is nervous/anxious. The patient is not hyperactive.    Physical Exam  Depressive Symptoms: none now  (Hypo) Manic Symptoms:   None  Anxiety Symptoms: Excessive Worry:  Yes Panic Symptoms:  No Agoraphobia:  No Obsessive Compulsive: Yes  Symptoms: ordliness Specific Phobias:  Yes Social Anxiety:  No  Psychotic Symptoms:  None  PTSD Symptoms: Ever had a traumatic exposure:  Yes Had a traumatic exposure in the last month:  No No other features  Traumatic Brain Injury: No   Past Psychiatric History: Diagnosis: Depression  Hospitalizations: once [facility]  Outpatient Care: PCP  Substance Abuse Care: none  Self-Mutilation: none  Suicidal Attempts: none  Violent Behaviors: one episode of getting a gun, but now sees that  that is not the way to handle her voices    Allergies: Allergies  Allergen Reactions  . Amoxicillin Hives    Sores in mouth  . Buprenorphine Hcl Itching    Can tolerate if necessary  . Morphine And Related Itching    Can tolerate if necessary  . Sulfonamide Derivatives Hives    Sores in mouth  . Valium [Diazepam] Itching    Can tolerate if necessary   Past Medical History:   Past Medical History:  Diagnosis Date  . Adenomatous polyp    x1  . ALLERGIC RHINITIS 12/20/2006  . Chronic anxiety   . Diverticulitis   . GERD (gastroesophageal reflux disease)    intermittent, treated with tums  . Hiatal hernia   . Hypothyroidism   . Insomnia   . Lumbago   . Osteoporosis 2007   declined prolia  . Perforation of right tympanic membrane 09/03/2015   Chronic, seen by Dr Janace Hoard ENT   . Personal history of kidney  stones    x1  . Postmenopausal atrophic vaginitis 2016   rec estrace cream - [place]  . Recurrent UTI   . Scoliosis   . Severe recurrent depression with psychosis ([place])   . Urinary retention   . Vitamin D deficiency    Surgical History: Past Surgical History:  Procedure Laterality Date  . ABDOMINAL HYSTERECTOMY  1979   ectopic pregnancy with one ovary removed  . APPENDECTOMY  1954  . CHOLECYSTECTOMY    . ESOPHAGOGASTRODUODENOSCOPY    . KNEE SURGERY Left 2012   L medial meniscal tear  . OOPHORECTOMY Right 1965   Ectopic pregnancy, removal of right ovary and tube- 1960  . ROTATOR CUFF REPAIR Right   . TONSILLECTOMY     History of Loss of Consciousness:  No Seizure History:  No Cardiac History:  No Allergies: Allergies  Allergen Reactions  . Amoxicillin Hives    Sores in mouth  . Buprenorphine Hcl Itching    Can tolerate if necessary  . Morphine And Related Itching    Can tolerate if necessary  . Sulfonamide Derivatives Hives    Sores in mouth  . Valium [Diazepam] Itching    Can tolerate if necessary   Medical History: Past Medical History:  Diagnosis  Date  . Adenomatous polyp    x1  . ALLERGIC RHINITIS 12/20/2006  . Chronic anxiety   . Diverticulitis   . GERD (gastroesophageal reflux disease)    intermittent, treated with tums  . Hiatal hernia   . Hypothyroidism   . Insomnia   . Lumbago   . Osteoporosis 2007   declined prolia  . Perforation of right tympanic membrane 09/03/2015   Chronic, seen by Dr Janace Hoard ENT   . Personal history of kidney stones    x1  . Postmenopausal atrophic vaginitis 2016   rec estrace cream - [place]  . Recurrent UTI   . Scoliosis   . Severe recurrent depression with psychosis ([place])   . Urinary retention   . Vitamin D deficiency    Surgical History: Past Surgical History:  Procedure Laterality Date  . ABDOMINAL HYSTERECTOMY  1979   ectopic pregnancy with one ovary removed  . APPENDECTOMY  1954  . CHOLECYSTECTOMY    . ESOPHAGOGASTRODUODENOSCOPY    . KNEE SURGERY Left 2012   L medial meniscal tear  . OOPHORECTOMY Right 1965   Ectopic pregnancy, removal of right ovary and tube- 1960  . ROTATOR CUFF REPAIR Right   . TONSILLECTOMY     Family History: family history includes Anxiety disorder in her grandchild; Cancer in her sister and sister; Depression in her grandchild; Diabetes in her brother, father, mother, and sister; Healthy in her sister; Schizophrenia in her sister. Reviewed and no changes noted.  Current Medications:  Current Outpatient Prescriptions  Medication Sig Dispense Refill  . bisacodyl (DULCOLAX) 10 MG suppository Place 1 suppository (10 mg total) rectally daily as needed for moderate constipation. 12 suppository 0  . Calcium Carbonate-Vitamin D (CALCIUM-D PO) Take 1 tablet by mouth daily.    . clonazePAM (KLONOPIN) 1 MG tablet Take 1 tablet (1 mg total) by mouth at bedtime. 90 tablet 2  . cyclobenzaprine (FLEXERIL) 10 MG tablet Take 10 mg by mouth 3 (three) times daily as needed for muscle spasms.    Marland Kitchen dicyclomine (BENTYL) 10 MG capsule Take 1 capsule (10 mg total) by mouth  daily as needed for spasms. 90 capsule 1  . fluticasone (FLONASE) 50 MCG/ACT nasal spray Place 2 sprays into  both nostrils daily as needed for allergies or rhinitis. 16 g 3  . HYDROcodone-acetaminophen (NORCO/VICODIN) 5-325 MG tablet Take 1 tablet by mouth every 6 (six) hours as needed for moderate pain.    . hydrocortisone (ANUSOL-HC) 25 MG suppository Place 1 suppository (25 mg total) rectally 2 (two) times daily as needed for hemorrhoids or itching. 12 suppository 0  . lamoTRIgine (LAMICTAL) 25 MG tablet Take 2 tablets (50 mg total) by mouth 2 (two) times daily. 360 tablet 2  . levothyroxine (SYNTHROID, LEVOTHROID) 88 MCG tablet TAKE 1 TABLET EVERY MORNING (NEED OFFICE VISIT AFTER ONE REFILL) 90 tablet 2  . montelukast (SINGULAIR) 10 MG tablet TAKE 1 TABLET AT BEDTIME 90 tablet 0  . OLANZapine (ZYPREXA) 5 MG tablet Take one in the am , one at noon and two at bedtime 360 tablet 2  . PROAIR HFA 108 (90 Base) MCG/ACT inhaler USE 2 INHALATIONS EVERY 6 HOURS AS NEEDED FOR COUGH 25.5 g 1  . venlafaxine (EFFEXOR) 37.5 MG tablet Take 1 tablet (37.5 mg total) by mouth 2 (two) times daily. 180 tablet 2  . Vitamin D, Ergocalciferol, (DRISDOL) 50000 UNITS CAPS capsule TAKE 1 CAPSULE EVERY 7 DAYS 12 capsule 2   No current facility-administered medications for this visit.     Previous Psychotropic Medications:  Medication Dose   Klonopin     Lithium ?    Risperdal    Lamictal    Effexor    Trazodone    Substance Abuse History in the last 12 months: Substance Age of 1st Use Last Use Amount Specific Type  Nicotine  none        Alcohol  20  March 13      Cannabis  none        Opiates  none        Cocaine  none        Methamphetamines  none        LSD  none        Ecstasy  none         Benzodiazepines  [age]      Caffeine  childhood  this AM      Inhalants  none        Others:       sugar  childhood  this AM     Medical Consequences of Substance Abuse: none  Legal Consequences of  Substance Abuse: none  Family Consequences of Substance Abuse: none  Social History: Current Place of Residence: [address] [place] [address] Place of Birth: [place], [place] Family Members: husband Marital Status:  Married Children: 1  Sons: 0  Daughters: 1 Relationships: husbandf Education:  Dentist Problems/Performance: good Religious Beliefs/Practices: Baptist History of Abuse: none Occupational Experiences: clerical, office, Architect History:  Press photographer History: none Hobbies/Interests: walking, reading, playing cards  Mental Status Examination/Evaluation: Objective:  Appearance: Casual neatly dressed   Eye Contact::  Good  Speech: Clear and coherent   Volume:  Normal  Mood:Good   Affect:  Congruent  Thought Process States that herThoughts have slowed down somewhat   Thought content: Hallucinations of music Are much fainter and almost totally gone     Suicidal Thoughts:  No  Homicidal Thoughts:  No  Judgement:  Good  Insight:  Good  Psychomotor Activity:  Normal  Akathisia:  No  Handed:  Right  AIMS (if indicated):    Assets:  Communication Skills Desire for Improvement  Lab Results:  Results for orders placed or performed in visit on 12/26/15 (from the past 8736 hour(s))  T4, free   Collection Time: 12/26/15  1:39 PM  Result Value Ref Range   Free T4 1.10 0.60 - 1.60 ng/dL  TSH   Collection Time: 12/26/15  1:39 PM  Result Value Ref Range   TSH 2.70 0.35 - 4.50 uIU/mL  VITAMIN D 25 Hydroxy (Vit-D Deficiency, Fractures)   Collection Time: 12/26/15  1:39 PM  Result Value Ref Range   VITD 35.70 30.00 - 100.00 ng/mL  Results for orders placed or performed in visit on 06/22/15 (from the past 8736 hour(s))  POCT Influenza A/B   Collection Time: 06/22/15  8:59 AM  Result Value Ref Range   Influenza A, POC Negative Negative   Influenza B, POC Negative Negative   Assessment:    AXIS I  major depression  with psychotic features, rule out dementia   AXIS II Deferred  AXIS III Past Medical History:  Diagnosis Date  . Adenomatous polyp    x1  . ALLERGIC RHINITIS 12/20/2006  . Chronic anxiety   . Diverticulitis   . GERD (gastroesophageal reflux disease)    intermittent, treated with tums  . Hiatal hernia   . Hypothyroidism   . Insomnia   . Lumbago   . Osteoporosis 2007   declined prolia  . Perforation of right tympanic membrane 09/03/2015   Chronic, seen by Dr Janace Hoard ENT   . Personal history of kidney stones    x1  . Postmenopausal atrophic vaginitis 2016   rec estrace cream - [place]  . Recurrent UTI   . Scoliosis   . Severe recurrent depression with psychosis ([place])   . Urinary retention   . Vitamin D deficiency      AXIS IV economic problems and other psychosocial or environmental problems  AXIS V 61-70 mild symptoms   Treatment Plan/Recommendations:  Laboratory:    Psychotherapy: supportive  Medications: As below   Routine PRN Medications:  No  Consultations: none  Safety Concerns:  none  Other:     Plan/Discussion: I took her vitals.  I reviewed CC, tobacco/med/surg Hx, meds effects/ side effects, problem list, therapies and responses as well as current situation/symptoms discussed options.  She will continue Effexor for depression and Lamictal for mood stabilization. She will continue clonazepam to 1 mg at bedtime to help with sleep. She will Continue olanzapine  5 mg in the morningAnd 5 mg noon and 10 at bedtime She will call if hallucinations worsen She'll return to see me in 3 months but call sooner if auditory hallucinations recur See orders and pt instructions for more details.  MEDICATIONS this encounter: Meds ordered this encounter  Medications  . clonazePAM (KLONOPIN) 1 MG tablet    Sig: Take 1 tablet (1 mg total) by mouth at bedtime.    Dispense:  90 tablet    Refill:  2    Medical Decision Making Problem Points:  Established problem, stable/improving  (1), New problem, with no additional work-up planned (3), Review of last therapy session (1) and Review of psycho-social stressors (1) Data Points:  Review or order clinical lab tests (1) Review of medication regiment & side effects (2) Review of new medications or change in dosage (2)  four-week's I certify that outpatient services furnished can reasonably be expected to improve the patient's condition.   Levonne Spiller, MD   Patient ID: Mckynna Vanloan, female   DOB: 1933-04-04, 83 y.o.  MRN: [id number] Patient ID: Annahi Short, female   DOB: [birth year], 83 y.o.   MRN: [id number]

## 2016-07-03 ENCOUNTER — Ambulatory Visit (HOSPITAL_COMMUNITY): Payer: Self-pay | Admitting: Psychiatry

## 2016-07-18 ENCOUNTER — Encounter (HOSPITAL_COMMUNITY): Payer: Self-pay | Admitting: Emergency Medicine

## 2016-07-18 ENCOUNTER — Ambulatory Visit (HOSPITAL_COMMUNITY)
Admission: EM | Admit: 2016-07-18 | Discharge: 2016-07-18 | Disposition: A | Discharge status: Home / Self Care | Payer: Medicare Other | Attending: Internal Medicine | Admitting: Internal Medicine

## 2016-07-18 ENCOUNTER — Ambulatory Visit (INDEPENDENT_AMBULATORY_CARE_PROVIDER_SITE_OTHER): Payer: Medicare Other

## 2016-07-18 DIAGNOSIS — S62522A Displaced fracture of distal phalanx of left thumb, initial encounter for closed fracture: Secondary | ICD-10-CM

## 2016-07-18 DIAGNOSIS — S62631A Displaced fracture of distal phalanx of left index finger, initial encounter for closed fracture: Secondary | ICD-10-CM | POA: Diagnosis not present

## 2016-07-18 DIAGNOSIS — W19XXXA Unspecified fall, initial encounter: Secondary | ICD-10-CM

## 2016-07-18 NOTE — ED Triage Notes (Signed)
Fell yesterday inside the home.  Left thumb is sore, painful, and swollen

## 2016-07-18 NOTE — ED Provider Notes (Signed)
CSN: [id number]     Arrival date & time 07/18/16  1427 History   First MD Initiated Contact with Patient 07/18/16 1450     Chief Complaint  Patient presents with  . Fall   (Consider location/radiation/quality/duration/timing/severity/associated sxs/prior Treatment)  Patient presents for evaluation of an injury to her left thumb. Injury occurred 1 day ago and has been unchanged since. Injury occurred while she was at home. Patient states that she lost her balance and fell. She used her left hand to break the fall causing the injury to the left thumb. She denies any hand, other finger, wrist or arm pain. No dizziness, shortness of breath or chest pain pre-or-post fall. She denies hitting her head or any loss of consciousness. No neck or back pain. No similar injury in the past.  She states that the pain is mild.  She has not noticed any paresthesias in the hand since the injury.  She has not been seen prior to today for this injury. Care prior to arrival consisted of nothing.         Past Medical History:  Diagnosis Date  . Adenomatous polyp    x1  . ALLERGIC RHINITIS 12/20/2006  . Chronic anxiety   . Diverticulitis   . GERD (gastroesophageal reflux disease)    intermittent, treated with tums  . Hiatal hernia   . Hypothyroidism   . Insomnia   . Lumbago   . Osteoporosis 2007   declined prolia  . Perforation of right tympanic membrane 09/03/2015   Chronic, seen by Dr Janace Hoard ENT   . Personal history of kidney stones    x1  . Postmenopausal atrophic vaginitis 2016   rec estrace cream - [place]  . Recurrent UTI   . Scoliosis   . Severe recurrent depression with psychosis ([place])   . Urinary retention   . Vitamin D deficiency    Past Surgical History:  Procedure Laterality Date  . ABDOMINAL HYSTERECTOMY  1979   ectopic pregnancy with one ovary removed  . APPENDECTOMY  1954  . CHOLECYSTECTOMY    . ESOPHAGOGASTRODUODENOSCOPY    . KNEE SURGERY Left 2012   L medial meniscal tear   . OOPHORECTOMY Right 1965   Ectopic pregnancy, removal of right ovary and tube- 1960  . ROTATOR CUFF REPAIR Right   . TONSILLECTOMY     Family History  Problem Relation Age of Onset  . Anxiety disorder Grandchild   . Depression Grandchild   . Cancer Sister        lung  . Healthy Sister   . Diabetes Mother   . Diabetes Father   . Schizophrenia Sister   . Diabetes Sister   . Diabetes Brother   . Cancer Sister        kidney  . Alcohol abuse Neg Hx   . Bipolar disorder Neg Hx   . Dementia Neg Hx   . Drug abuse Neg Hx   . OCD Neg Hx   . Paranoid behavior Neg Hx   . Seizures Neg Hx   . Sexual abuse Neg Hx   . Physical abuse Neg Hx    Social History  Substance Use Topics  . Smoking status: Never Smoker  . Smokeless tobacco: Never Used  . Alcohol use No   OB History    No data available     Review of Systems  Musculoskeletal:       Left thumb pain, swelling and bruising   All other systems reviewed and are  negative.   Allergies  Amoxicillin; Buprenorphine hcl; Morphine and related; Sulfonamide derivatives; and Valium [diazepam]  Home Medications   Prior to Admission medications   Medication Sig Start Date End Date Taking? Authorizing Provider  bisacodyl (DULCOLAX) 10 MG suppository Place 1 suppository (10 mg total) rectally daily as needed for moderate constipation. 05/09/14   Hongalgi, Lenis Dickinson, MD  Calcium Carbonate-Vitamin D (CALCIUM-D PO) Take 1 tablet by mouth daily.    [provider]  clonazePAM (KLONOPIN) 1 MG tablet Take 1 tablet (1 mg total) by mouth at bedtime. 06/10/16 06/10/17  Cloria Spring, MD  cyclobenzaprine (FLEXERIL) 10 MG tablet Take 10 mg by mouth 3 (three) times daily as needed for muscle spasms.    [provider]  dicyclomine (BENTYL) 10 MG capsule Take 1 capsule (10 mg total) by mouth daily as needed for spasms. 05/24/16   Ria Bush, MD  fluticasone [facility]) 50 MCG/ACT nasal spray Place 2 sprays into both nostrils  daily as needed for allergies or rhinitis. 05/24/16   Ria Bush, MD  HYDROcodone-acetaminophen (NORCO/VICODIN) 5-325 MG tablet Take 1 tablet by mouth every 6 (six) hours as needed for moderate pain. 05/24/16   Ria Bush, MD  hydrocortisone (ANUSOL-HC) 25 MG suppository Place 1 suppository (25 mg total) rectally 2 (two) times daily as needed for hemorrhoids or itching. 05/09/14   Hongalgi, Lenis Dickinson, MD  lamoTRIgine (LAMICTAL) 25 MG tablet Take 2 tablets (50 mg total) by mouth 2 (two) times daily. 04/05/16   Cloria Spring, MD  levothyroxine (SYNTHROID, LEVOTHROID) 88 MCG tablet TAKE 1 TABLET EVERY MORNING (NEED OFFICE VISIT AFTER ONE REFILL) 05/27/16   Elayne Snare, MD  montelukast (SINGULAIR) 10 MG tablet TAKE 1 TABLET AT BEDTIME 03/19/16   Ria Bush, MD  OLANZapine [facility]) 5 MG tablet Take one in the am , one at noon and two at bedtime 05/20/16   Cloria Spring, MD  PROAIR HFA 108 [phone] Base) MCG/ACT inhaler USE 2 INHALATIONS EVERY 6 HOURS AS NEEDED FOR COUGH 12/19/15   Ria Bush, MD  venlafaxine (EFFEXOR) 37.5 MG tablet Take 1 tablet (37.5 mg total) by mouth 2 (two) times daily. 04/05/16   Cloria Spring, MD  Vitamin D, Ergocalciferol, (DRISDOL) 50000 UNITS CAPS capsule TAKE 1 CAPSULE EVERY 7 DAYS 11/14/14   Elayne Snare, MD   Meds Ordered and Administered this Visit  Medications - No data to display  BP (!) 157/87 (BP Location: Right Arm) Comment: notified rn  Pulse 93   Temp 98.6 F (37 C) (Oral)   Resp 16   LMP 07/15/2012   SpO2 95%  No data found.   Physical Exam  Constitutional: She is oriented to person, place, and time. She appears well-developed and well-nourished.  HENT:  Head: Normocephalic and atraumatic.  Neck: Normal range of motion. Neck supple.  Cardiovascular: Normal rate and regular rhythm.   Pulmonary/Chest: Effort normal and breath sounds normal.  Musculoskeletal: Normal range of motion. She exhibits edema and tenderness. She exhibits no  deformity.  Left thumb swelling, bruising. Mild tenderness. Limited ROM due to swelling. Neurovascularly intact.   Neurological: She is alert and oriented to person, place, and time.  Skin: Skin is warm and dry.  Psychiatric: She has a normal mood and affect.    Urgent Care Course     Procedures (including critical care time)  Labs Review Labs Reviewed - No data to display  Imaging Review Dg Finger Thumb Left  Result Date: 07/18/2016 CLINICAL DATA:  Left thumb pain and swelling after fall yesterday. EXAM: LEFT THUMB 2+V COMPARISON:  None. FINDINGS: Minimally displaced fracture is seen involving the dorsal aspect of the proximal base of the first distal phalanx. No other bony abnormality is noted. Joint spaces are intact. No soft tissue abnormality is noted. IMPRESSION: Minimally displaced fracture involving the proximal base of the first distal phalanx. Electronically Signed   By: Marijo Conception, M.D.   On: 07/18/2016 15:17     Visual Acuity Review  Right Eye Distance:   Left Eye Distance:   Bilateral Distance:    Right Eye Near:   Left Eye Near:    Bilateral Near:         MDM   1. Closed displaced fracture of distal phalanx of left thumb, initial encounter     Minimally displaced fracture involving the proximal base of the left first distal phalanx. Patient placed in thumb spica. Given referral to ortho. NSAIDs as needed for pain.   Discussed diagnosis and treatment with patient. All questions have been answered and all concerns have been addressed. The patient verbalized understanding and had no further questions     Enrique Sack, [place] 07/18/16 1534

## 2016-07-22 DIAGNOSIS — S62525A Nondisplaced fracture of distal phalanx of left thumb, initial encounter for closed fracture: Secondary | ICD-10-CM | POA: Diagnosis not present

## 2016-08-02 DIAGNOSIS — Z4789 Encounter for other orthopedic aftercare: Secondary | ICD-10-CM | POA: Diagnosis not present

## 2016-08-02 DIAGNOSIS — S62525D Nondisplaced fracture of distal phalanx of left thumb, subsequent encounter for fracture with routine healing: Secondary | ICD-10-CM | POA: Diagnosis not present

## 2016-08-20 ENCOUNTER — Other Ambulatory Visit: Payer: Self-pay | Admitting: Endocrinology

## 2016-08-26 DIAGNOSIS — S62525D Nondisplaced fracture of distal phalanx of left thumb, subsequent encounter for fracture with routine healing: Secondary | ICD-10-CM | POA: Diagnosis not present

## 2016-09-05 DIAGNOSIS — M545 Low back pain: Secondary | ICD-10-CM | POA: Diagnosis not present

## 2016-09-05 DIAGNOSIS — M47816 Spondylosis without myelopathy or radiculopathy, lumbar region: Secondary | ICD-10-CM | POA: Diagnosis not present

## 2016-09-05 DIAGNOSIS — G894 Chronic pain syndrome: Secondary | ICD-10-CM | POA: Diagnosis not present

## 2016-09-09 ENCOUNTER — Encounter (HOSPITAL_COMMUNITY): Payer: Self-pay | Admitting: Psychiatry

## 2016-09-09 ENCOUNTER — Ambulatory Visit (INDEPENDENT_AMBULATORY_CARE_PROVIDER_SITE_OTHER): Payer: Medicare Other | Admitting: Psychiatry

## 2016-09-09 ENCOUNTER — Other Ambulatory Visit: Payer: Self-pay

## 2016-09-09 VITALS — BP 140/76 | Ht 65.0 in | Wt 185.0 lb

## 2016-09-09 DIAGNOSIS — F341 Dysthymic disorder: Secondary | ICD-10-CM

## 2016-09-09 DIAGNOSIS — F29 Unspecified psychosis not due to a substance or known physiological condition: Secondary | ICD-10-CM

## 2016-09-09 DIAGNOSIS — Z818 Family history of other mental and behavioral disorders: Secondary | ICD-10-CM

## 2016-09-09 MED ORDER — OLANZAPINE 5 MG PO TABS: ORAL_TABLET | ORAL | 2 refills | Status: DC

## 2016-09-09 MED ORDER — VENLAFAXINE HCL 37.5 MG PO TABS: 37.5000 mg | ORAL_TABLET | Freq: Two times a day (BID) | ORAL | 2 refills | Status: DC

## 2016-09-09 MED ORDER — CLONAZEPAM 1 MG PO TABS: 1.0000 mg | ORAL_TABLET | Freq: Every day | ORAL | 2 refills | Status: DC

## 2016-09-09 MED ORDER — LAMOTRIGINE 25 MG PO TABS: 50.0000 mg | ORAL_TABLET | Freq: Two times a day (BID) | ORAL | 2 refills | Status: DC

## 2016-09-09 NOTE — Progress Notes (Signed)
Patient ID: Jamie Pitts, female   DOB: [birth year], 83 y.o.   MRN: [id number] Patient ID: Jamie Pitts, female   DOB: 10-05-1933, 83 y.o.   MRN: [id number] Patient ID: Jamie Pitts, female   DOB: 1933-05-21, 83 y.o.   MRN: [id number] Patient ID: Jamie Pitts, female   DOB: 01-Oct-1933, 83 y.o.   MRN: [id number] Patient ID: Jamie Pitts, female   DOB: 10-21-1933, 83 y.o.   MRN: [id number] Patient ID: Jamie Pitts, female   DOB: 1934/01/27, 83 y.o.   MRN: [id number] Patient ID: Jamie Pitts, female   DOB: 01-04-1934, 83 y.o.   MRN: [id number] Patient ID: Jamie Pitts, female   DOB: [birth year], 83 y.o.   MRN: [id number] Patient ID: Jamie Pitts, female   DOB: 12-09-1933, 83 y.o.   MRN: [id number] Patient ID: Jamie Pitts, female   DOB: November 15, 1933, 83 y.o.   MRN: [id number] Patient ID: Jamie Pitts, female   DOB: 1933-08-14, 83 y.o.   MRN: [id number] Patient ID: Jamie Pitts, female   DOB: 02/13/1934, 83 y.o.   MRN: [id number] [facility] [id number] Progress Note [name] MRN: [id number] DOB: 11-03-1933 Age: 83 y.o.  Date: 09/09/2016   Chief Complaint: No chief complaint on file.  Subjective: All the music is gone  This patient is a 83 year old married white female who lives with her husband in [place]. She has one daughter and 3 grandchildren. She is retired from the [employer].  Apparently the patient had some sort of psychotic episode in 2013. She was admitted to [facility] because she was hearing voices at home. The voices got so bad that she cannot put a gun under the bed and wanted to shoot him. The patient is a poor historian and is hard of hearing and gets easily confused. She was placed on various medicines in the hospital which have been continued. It's unclear if she is medication compliant. For example she's not taking the Cogentin. She repeats herself a lot.  The patient states she still hearing voices every day. They're not frightening  her like they were before but there seemed to her and she can't always get them to stop. She sleeping pretty well she denies being depressed but she is obviously not thinking clearly. She mentions that she's been diagnosed with parkinsonism but she doesn't have a tremor.  The patient returns after 2 months. She now states that she's no longer hearing any voices or any music. After we increased the olanzapine all of this has disappeared. She has gained about 7 pounds and I told her that olanzapine can cause weight gain and she really needs to watch her calories and she agrees to do so. She has a physical scheduled. Her mood is good she is sleeping well at night and she denies any symptoms of auditory visual hallucinations or suicidal thinking. She is very grateful that the music has stopped   History of Chief Complaint:   At age 61 pt went to a "fat farm" to fatten her up.  She remembers her father paid more attention to her than her mother did.  She experienced some anxiety when she moved [place].  She was in [employer] for 2 years, then she was the Glass blower/designer for [employer] and then got into [employer].  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  She noted feeling very home sick to go back to [place] her home place in 2010.  Her sister had been very nervous and she was beginning to feel  anxious and was started on Klonopin.  ISince then she noted songs for 3 or 4 years.  Then in October of 2013, she started hearing voices non stop and she lost 30 pounds. This prompted her to get a gun and put it under the bed and the family got concerned.  She was admitted to [facility] on January 24th.  She was stopped from the Klonopin and Lithium (pt doesn't understand how she got on that) and placed on Ripserdal, Trazodone, Lamictal, and Effexor with pretty good results, except she has very dry mouth at nigh  She has noted blurred vision since starting the meds.  She is getting new glasses for  herself.  Anxiety  Symptoms include confusion, decreased concentration and nervous/anxious behavior. Patient reports no dizziness or suicidal ideas.    Depression         Associated symptoms include decreased concentration.  Associated symptoms include no headaches and no suicidal ideas.  Past medical history includes anxiety.    Review of Systems  HENT: Negative.   Gastrointestinal: Negative.   Genitourinary: Negative.   Neurological: Positive for weakness and light-headedness. Negative for dizziness, tremors, seizures, syncope, facial asymmetry, speech difficulty, numbness and headaches.       Lightheaded if gets up too fast.  Psychiatric/Behavioral: Positive for confusion, decreased concentration, depression and dysphoric mood. Negative for agitation, behavioral problems, hallucinations, self-injury, sleep disturbance and suicidal ideas. The patient is nervous/anxious. The patient is not hyperactive.    Physical Exam  Depressive Symptoms: none now  (Hypo) Manic Symptoms:   None  Anxiety Symptoms: Excessive Worry:  Yes Panic Symptoms:  No Agoraphobia:  No Obsessive Compulsive: Yes  Symptoms: ordliness Specific Phobias:  Yes Social Anxiety:  No  Psychotic Symptoms:  None  PTSD Symptoms: Ever had a traumatic exposure:  Yes Had a traumatic exposure in the last month:  No No other features  Traumatic Brain Injury: No   Past Psychiatric History: Diagnosis: Depression  Hospitalizations: once [facility]  Outpatient Care: PCP  Substance Abuse Care: none  Self-Mutilation: none  Suicidal Attempts: none  Violent Behaviors: one episode of getting a gun, but now sees that that is not the way to handle her voices    Allergies: Allergies  Allergen Reactions  . Amoxicillin Hives    Sores in mouth  . Buprenorphine Hcl Itching    Can tolerate if necessary  . Morphine And Related Itching    Can tolerate if necessary  . Sulfonamide Derivatives Hives    Sores in mouth  .  Valium [Diazepam] Itching    Can tolerate if necessary   Past Medical History:   Past Medical History:  Diagnosis Date  . Adenomatous polyp    x1  . ALLERGIC RHINITIS 12/20/2006  . Chronic anxiety   . Diverticulitis   . GERD (gastroesophageal reflux disease)    intermittent, treated with tums  . Hiatal hernia   . Hypothyroidism   . Insomnia   . Lumbago   . Osteoporosis 2007   declined prolia  . Perforation of right tympanic membrane 09/03/2015   Chronic, seen by Dr Janace Hoard ENT   . Personal history of kidney stones    x1  . Postmenopausal atrophic vaginitis 2016   rec estrace cream - [place]  . Recurrent UTI   . Scoliosis   . Severe recurrent depression with psychosis ([place])   . Urinary retention   . Vitamin D deficiency    Surgical History: Past Surgical History:  Procedure Laterality Date  .  ABDOMINAL HYSTERECTOMY  1979   ectopic pregnancy with one ovary removed  . APPENDECTOMY  1954  . CHOLECYSTECTOMY    . ESOPHAGOGASTRODUODENOSCOPY    . KNEE SURGERY Left 2012   L medial meniscal tear  . OOPHORECTOMY Right 1965   Ectopic pregnancy, removal of right ovary and tube- 1960  . ROTATOR CUFF REPAIR Right   . TONSILLECTOMY     History of Loss of Consciousness:  No Seizure History:  No Cardiac History:  No Allergies: Allergies  Allergen Reactions  . Amoxicillin Hives    Sores in mouth  . Buprenorphine Hcl Itching    Can tolerate if necessary  . Morphine And Related Itching    Can tolerate if necessary  . Sulfonamide Derivatives Hives    Sores in mouth  . Valium [Diazepam] Itching    Can tolerate if necessary   Medical History: Past Medical History:  Diagnosis Date  . Adenomatous polyp    x1  . ALLERGIC RHINITIS 12/20/2006  . Chronic anxiety   . Diverticulitis   . GERD (gastroesophageal reflux disease)    intermittent, treated with tums  . Hiatal hernia   . Hypothyroidism   . Insomnia   . Lumbago   . Osteoporosis 2007   declined prolia  . Perforation  of right tympanic membrane 09/03/2015   Chronic, seen by Dr Janace Hoard ENT   . Personal history of kidney stones    x1  . Postmenopausal atrophic vaginitis 2016   rec estrace cream - [place]  . Recurrent UTI   . Scoliosis   . Severe recurrent depression with psychosis ([place])   . Urinary retention   . Vitamin D deficiency    Surgical History: Past Surgical History:  Procedure Laterality Date  . ABDOMINAL HYSTERECTOMY  1979   ectopic pregnancy with one ovary removed  . APPENDECTOMY  1954  . CHOLECYSTECTOMY    . ESOPHAGOGASTRODUODENOSCOPY    . KNEE SURGERY Left 2012   L medial meniscal tear  . OOPHORECTOMY Right 1965   Ectopic pregnancy, removal of right ovary and tube- 1960  . ROTATOR CUFF REPAIR Right   . TONSILLECTOMY     Family History: family history includes Anxiety disorder in her grandchild; Cancer in her sister and sister; Depression in her grandchild; Diabetes in her brother, father, mother, and sister; Healthy in her sister; Schizophrenia in her sister. Reviewed and no changes noted.  Current Medications:  Current Outpatient Prescriptions  Medication Sig Dispense Refill  . bisacodyl (DULCOLAX) 10 MG suppository Place 1 suppository (10 mg total) rectally daily as needed for moderate constipation. 12 suppository 0  . Calcium Carbonate-Vitamin D (CALCIUM-D PO) Take 1 tablet by mouth daily.    . clonazePAM (KLONOPIN) 1 MG tablet Take 1 tablet (1 mg total) by mouth at bedtime. 90 tablet 2  . cyclobenzaprine (FLEXERIL) 10 MG tablet Take 10 mg by mouth 3 (three) times daily as needed for muscle spasms.    Marland Kitchen dicyclomine (BENTYL) 10 MG capsule Take 1 capsule (10 mg total) by mouth daily as needed for spasms. 90 capsule 1  . fluticasone (FLONASE) 50 MCG/ACT nasal spray Place 2 sprays into both nostrils daily as needed for allergies or rhinitis. 16 g 3  . HYDROcodone-acetaminophen (NORCO/VICODIN) 5-325 MG tablet Take 1 tablet by mouth every 6 (six) hours as needed for moderate pain.     . hydrocortisone (ANUSOL-HC) 25 MG suppository Place 1 suppository (25 mg total) rectally 2 (two) times daily as needed for hemorrhoids or itching. 12  suppository 0  . lamoTRIgine (LAMICTAL) 25 MG tablet Take 2 tablets (50 mg total) by mouth 2 (two) times daily. 360 tablet 2  . levothyroxine (SYNTHROID, LEVOTHROID) 88 MCG tablet TAKE 1 TABLET EVERY MORNING (NEED OFFICE VISIT AFTER ONE REFILL) 90 tablet 2  . levothyroxine (SYNTHROID, LEVOTHROID) 88 MCG tablet Take 1 tablet (88 mcg total) by mouth every morning. 90 tablet 0  . montelukast (SINGULAIR) 10 MG tablet TAKE 1 TABLET AT BEDTIME 90 tablet 0  . OLANZapine (ZYPREXA) 5 MG tablet Take one in the am , one at noon and two at bedtime 360 tablet 2  . PROAIR HFA 108 (90 Base) MCG/ACT inhaler USE 2 INHALATIONS EVERY 6 HOURS AS NEEDED FOR COUGH 25.5 g 1  . venlafaxine (EFFEXOR) 37.5 MG tablet Take 1 tablet (37.5 mg total) by mouth 2 (two) times daily. 180 tablet 2  . Vitamin D, Ergocalciferol, (DRISDOL) 50000 UNITS CAPS capsule TAKE 1 CAPSULE EVERY 7 DAYS 12 capsule 2   No current facility-administered medications for this visit.     Previous Psychotropic Medications:  Medication Dose   Klonopin     Lithium ?    Risperdal    Lamictal    Effexor    Trazodone    Substance Abuse History in the last 12 months: Substance Age of 1st Use Last Use Amount Specific Type  Nicotine  none        Alcohol  20  March 13      Cannabis  none        Opiates  none        Cocaine  none        Methamphetamines  none        LSD  none        Ecstasy  none         Benzodiazepines  [age]      Caffeine  childhood  this AM      Inhalants  none        Others:       sugar  childhood  this AM     Medical Consequences of Substance Abuse: none  Legal Consequences of Substance Abuse: none  Family Consequences of Substance Abuse: none  Social History: Current Place of Residence: [address] [place] [address] Place of Birth: [place],  [place] Family Members: husband Marital Status:  Married Children: 1  Sons: 0  Daughters: 1 Relationships: husbandf Education:  Dentist Problems/Performance: good Religious Beliefs/Practices: [employer] History of Abuse: none Occupational Experiences: clerical, office, Architect History:  Press photographer History: none Hobbies/Interests: walking, reading, playing cards  Mental Status Examination/Evaluation: Objective:  Appearance: Casual neatly dressed   Engineer, water::  Good  Speech: Clear and coherent   Volume:  Normal  Mood:Good   Affect:  Congruent  Thought Process States that herThoughts have slowed down somewhat   Thought content: Hallucinations of musicAre gone     Suicidal Thoughts:  No  Homicidal Thoughts:  No  Judgement:  Good  Insight:  Good  Psychomotor Activity:  Normal  Akathisia:  No  Handed:  Right  AIMS (if indicated):    Assets:  Communication Skills Desire for Improvement   Lab Results:  Results for orders placed or performed in visit on 12/26/15 (from the past 8736 hour(s))  T4, free   Collection Time: 12/26/15  1:39 PM  Result Value Ref Range   Free T4 1.10 0.60 - 1.60 ng/dL  TSH  Collection Time: 12/26/15  1:39 PM  Result Value Ref Range   TSH 2.70 0.35 - 4.50 uIU/mL  VITAMIN D 25 Hydroxy (Vit-D Deficiency, Fractures)   Collection Time: 12/26/15  1:39 PM  Result Value Ref Range   VITD 35.70 30.00 - 100.00 ng/mL   Assessment:    AXIS I  major depression with psychotic features, rule out dementia   AXIS II Deferred  AXIS III Past Medical History:  Diagnosis Date  . Adenomatous polyp    x1  . ALLERGIC RHINITIS 12/20/2006  . Chronic anxiety   . Diverticulitis   . GERD (gastroesophageal reflux disease)    intermittent, treated with tums  . Hiatal hernia   . Hypothyroidism   . Insomnia   . Lumbago   . Osteoporosis 2007   declined prolia  . Perforation of right tympanic membrane 09/03/2015   Chronic, seen by Dr Janace Hoard  ENT   . Personal history of kidney stones    x1  . Postmenopausal atrophic vaginitis 2016   rec estrace cream - [place]  . Recurrent UTI   . Scoliosis   . Severe recurrent depression with psychosis ([place])   . Urinary retention   . Vitamin D deficiency      AXIS IV economic problems and other psychosocial or environmental problems  AXIS V 61-70 mild symptoms   Treatment Plan/Recommendations:  Laboratory:    Psychotherapy: supportive  Medications: As below   Routine PRN Medications:  No  Consultations: none  Safety Concerns:  none  Other:     Plan/Discussion: I took her vitals.  I reviewed CC, tobacco/med/surg Hx, meds effects/ side effects, problem list, therapies and responses as well as current situation/symptoms discussed options.  She will continue Effexor for depression and Lamictal for mood stabilization. She will continue clonazepam to 1 mg at bedtime to help with sleep. She will Continue olanzapine  5 mg in the morningAnd 5 mg noon and 10 at bedtime She will call if hallucinations worsen She'll return to see me in 3 months but call sooner if auditory hallucinations recur See orders and pt instructions for more details.  MEDICATIONS this encounter: Meds ordered this encounter  Medications  . venlafaxine (EFFEXOR) 37.5 MG tablet    Sig: Take 1 tablet (37.5 mg total) by mouth 2 (two) times daily.    Dispense:  180 tablet    Refill:  2  . lamoTRIgine (LAMICTAL) 25 MG tablet    Sig: Take 2 tablets (50 mg total) by mouth 2 (two) times daily.    Dispense:  360 tablet    Refill:  2  . OLANZapine (ZYPREXA) 5 MG tablet    Sig: Take one in the am , one at noon and two at bedtime    Dispense:  360 tablet    Refill:  2  . clonazePAM (KLONOPIN) 1 MG tablet    Sig: Take 1 tablet (1 mg total) by mouth at bedtime.    Dispense:  90 tablet    Refill:  2    Medical Decision Making Problem Points:  Established problem, stable/improving (1), New problem, with no additional work-up  planned (3), Review of last therapy session (1) and Review of psycho-social stressors (1) Data Points:  Review or order clinical lab tests (1) Review of medication regiment & side effects (2) Review of new medications or change in dosage (2)  four-week's I certify that outpatient services furnished can reasonably be expected to improve the patient's condition.   Levonne Spiller, MD  Patient ID: Chiquetta Langner, female   DOB: 26-Oct-1933, 83 y.o.   MRN: [id number] Patient ID: Marcelina Mclaurin, female   DOB: [birth year], 83 y.o.   MRN: [id number]

## 2016-10-17 DIAGNOSIS — H6123 Impacted cerumen, bilateral: Secondary | ICD-10-CM | POA: Diagnosis not present

## 2016-10-17 DIAGNOSIS — H722X1 Other marginal perforations of tympanic membrane, right ear: Secondary | ICD-10-CM | POA: Diagnosis not present

## 2016-11-12 DIAGNOSIS — M545 Low back pain: Secondary | ICD-10-CM | POA: Diagnosis not present

## 2016-11-13 ENCOUNTER — Other Ambulatory Visit (INDEPENDENT_AMBULATORY_CARE_PROVIDER_SITE_OTHER): Payer: Medicare Other

## 2016-11-13 DIAGNOSIS — E063 Autoimmune thyroiditis: Secondary | ICD-10-CM | POA: Diagnosis not present

## 2016-11-13 LAB — TSH: TSH: 0.64 u[IU]/mL (ref 0.35–4.50)

## 2016-11-13 LAB — T4, FREE: Free T4: 0.89 ng/dL (ref 0.60–1.60)

## 2016-11-17 ENCOUNTER — Other Ambulatory Visit: Payer: Self-pay | Admitting: Family Medicine

## 2016-11-17 DIAGNOSIS — E039 Hypothyroidism, unspecified: Secondary | ICD-10-CM

## 2016-11-17 DIAGNOSIS — Z5181 Encounter for therapeutic drug level monitoring: Secondary | ICD-10-CM

## 2016-11-18 ENCOUNTER — Ambulatory Visit (INDEPENDENT_AMBULATORY_CARE_PROVIDER_SITE_OTHER): Payer: Medicare Other

## 2016-11-18 VITALS — BP 118/64 | HR 89 | Temp 98.1°F | Ht 63.75 in | Wt 186.0 lb

## 2016-11-18 DIAGNOSIS — Z23 Encounter for immunization: Secondary | ICD-10-CM

## 2016-11-18 DIAGNOSIS — Z5181 Encounter for therapeutic drug level monitoring: Secondary | ICD-10-CM | POA: Diagnosis not present

## 2016-11-18 DIAGNOSIS — E039 Hypothyroidism, unspecified: Secondary | ICD-10-CM

## 2016-11-18 DIAGNOSIS — Z0000 Encounter for general adult medical examination without abnormal findings: Secondary | ICD-10-CM

## 2016-11-18 LAB — COMPREHENSIVE METABOLIC PANEL
ALK PHOS: 75 U/L (ref 39–117)
ALT: 16 U/L (ref 0–35)
AST: 16 U/L (ref 0–37)
Albumin: 4 g/dL (ref 3.5–5.2)
BUN: 13 mg/dL (ref 6–23)
CO2: 28 meq/L (ref 19–32)
Calcium: 9.7 mg/dL (ref 8.4–10.5)
Chloride: 99 mEq/L (ref 96–112)
Creatinine, Ser: 1.05 mg/dL (ref 0.40–1.20)
GFR: 53.45 mL/min — ABNORMAL LOW (ref >60.00)
GLUCOSE: 83 mg/dL (ref 70–99)
Potassium: 3.9 mEq/L (ref 3.5–5.1)
SODIUM: 136 meq/L (ref 135–145)
TOTAL PROTEIN: 7 g/dL (ref 6.0–8.3)
Total Bilirubin: 0.5 mg/dL (ref 0.2–1.2)

## 2016-11-18 LAB — CBC WITH DIFFERENTIAL/PLATELET
BASOS ABS: 0.1 10*3/uL (ref 0.0–0.1)
Basophils Relative: 0.7 % (ref 0.0–3.0)
EOS PCT: 2.7 % (ref 0.0–5.0)
Eosinophils Absolute: 0.2 10*3/uL (ref 0.0–0.7)
HCT: 38.2 % (ref 36.0–46.0)
Hemoglobin: 12.5 g/dL (ref 12.0–15.0)
LYMPHS ABS: 1.6 10*3/uL (ref 0.7–4.0)
Lymphocytes Relative: 20.6 % (ref 12.0–46.0)
MCHC: 32.7 g/dL (ref 30.0–36.0)
MCV: 88.3 fl (ref 78.0–100.0)
MONO ABS: 0.7 10*3/uL (ref 0.1–1.0)
MONOS PCT: 9.3 % (ref 3.0–12.0)
NEUTROS ABS: 5.3 10*3/uL (ref 1.4–7.7)
NEUTROS PCT: 66.7 % (ref 43.0–77.0)
PLATELETS: 276 10*3/uL (ref 150.0–400.0)
RBC: 4.33 Mil/uL (ref 3.87–5.11)
RDW: 15.1 % (ref 11.5–15.5)
WBC: 8 10*3/uL (ref 4.0–10.5)

## 2016-11-18 LAB — LIPID PANEL
CHOLESTEROL: 208 mg/dL — AB (ref 0–200)
HDL: 43.1 mg/dL (ref >39.00)
NonHDL: 164.91
Total CHOL/HDL Ratio: 5
Triglycerides: 248 mg/dL — ABNORMAL HIGH (ref 0.0–149.0)
VLDL: 49.6 mg/dL — ABNORMAL HIGH (ref 0.0–40.0)

## 2016-11-18 LAB — LDL CHOLESTEROL, DIRECT: LDL DIRECT: 120 mg/dL

## 2016-11-18 NOTE — Progress Notes (Signed)
Patient ID: Jamie Pitts, female   DOB: 01-24-1934, 83 y.o.   MRN: [id number]    Reason for Appointment:  followup visit    History of Present Illness:   HYPOTHYROIDISM: This was first diagnosed in 1980 This was diagnosed when she had symptoms of fatigue and hair loss No history of goiter  She has been on generic Synthroid and has been on 88 mcg daily and is currently taking 6-1/2 tablets a week She is not complaining of any unusual fatigue but is concerned about her weight gain this year No hair loss  Compliance with the medical regimen has been as prescribed with taking the tablet in the morning before breakfast.         Wt Readings from Last 3 Encounters:  11/19/16 189 lb 9.6 oz (86 kg)  11/18/16 186 lb (84.4 kg)  05/24/16 180 lb 8 oz (81.9 kg)    Labs from last week show her TSH in the normal range again   Lab Results  Component Value Date   TSH 0.64 11/13/2016   TSH 2.70 12/26/2015   TSH 0.65 07/28/2014   FREET4 0.89 11/13/2016   FREET4 1.10 12/26/2015   FREET4 1.02 07/28/2014     OSTEOPOROSIS:  She has had asymptomatic osteoporosis, diagnosed probably in 1997  At that time she was treated with bisphosphonates, probably Fosamax for about 7 years  Was taking Fosamax in 2006 and Actonel in 2007, not clear how long she continued Actonel  She had a T score of -2.8 in 2007 She  had another bone density in 7/15 and the lowest T score is -1.9 at the spine, normal at the hip  She also has had vitamin D deficiency  She was told to take vitamin D by prescription every other week but she is not taking this weekly on her own  Not taking any calcium supplements regularly   Lab Results  Component Value Date   VD25OH 35.70 12/26/2015   VD25OH 54.60 10/13/2013     Allergies as of 11/19/2016      Reactions   Amoxicillin Hives   Sores in mouth   Buprenorphine Hcl Itching   Can tolerate if necessary   Morphine And Related Itching   Can tolerate if  necessary   Sulfonamide Derivatives Hives   Sores in mouth   Valium [diazepam] Itching   Can tolerate if necessary      Medication List       Accurate as of 11/19/16  8:32 AM. Always use your most recent med list.          bisacodyl 10 MG suppository Commonly known as:  DULCOLAX Place 1 suppository (10 mg total) rectally daily as needed for moderate constipation.   CALCIUM-D PO Take 1 tablet by mouth daily.   clonazePAM 1 MG tablet Commonly known as:  KLONOPIN Take 1 tablet (1 mg total) by mouth at bedtime.   cyclobenzaprine 10 MG tablet Commonly known as:  FLEXERIL Take 10 mg by mouth 3 (three) times daily as needed for muscle spasms.   dicyclomine 10 MG capsule Commonly known as:  BENTYL Take 1 capsule (10 mg total) by mouth daily as needed for spasms.   fluticasone 50 MCG/ACT nasal spray Commonly known as:  FLONASE Place 2 sprays into both nostrils daily as needed for allergies or rhinitis.   HYDROcodone-acetaminophen 5-325 MG tablet Commonly known as:  NORCO/VICODIN Take 1 tablet by mouth every 6 (six) hours as needed for moderate pain.  hydrocortisone 25 MG suppository Commonly known as:  ANUSOL-HC Place 1 suppository (25 mg total) rectally 2 (two) times daily as needed for hemorrhoids or itching.   lamoTRIgine 25 MG tablet Commonly known as:  LAMICTAL Take 2 tablets (50 mg total) by mouth 2 (two) times daily.   levothyroxine 88 MCG tablet Commonly known as:  SYNTHROID, LEVOTHROID TAKE 1 TABLET EVERY MORNING (NEED OFFICE VISIT AFTER ONE REFILL)   levothyroxine 88 MCG tablet Commonly known as:  SYNTHROID, LEVOTHROID Take 1 tablet (88 mcg total) by mouth every morning.   montelukast 10 MG tablet Commonly known as:  SINGULAIR TAKE 1 TABLET AT BEDTIME   OLANZapine 5 MG tablet Commonly known as:  ZYPREXA Take one in the am , one at noon and two at bedtime   PROAIR HFA 108 (90 Base) MCG/ACT inhaler Generic drug:  albuterol USE 2 INHALATIONS EVERY 6  HOURS AS NEEDED FOR COUGH   venlafaxine 37.5 MG tablet Commonly known as:  EFFEXOR Take 1 tablet (37.5 mg total) by mouth 2 (two) times daily.   Vitamin D (Ergocalciferol) 50000 units Caps capsule Commonly known as:  DRISDOL TAKE 1 CAPSULE EVERY 7 DAYS       Allergies:  Allergies  Allergen Reactions  . Amoxicillin Hives    Sores in mouth  . Buprenorphine Hcl Itching    Can tolerate if necessary  . Morphine And Related Itching    Can tolerate if necessary  . Sulfonamide Derivatives Hives    Sores in mouth  . Valium [Diazepam] Itching    Can tolerate if necessary    Past Medical History:  Diagnosis Date  . Adenomatous polyp    x1  . ALLERGIC RHINITIS 12/20/2006  . Chronic anxiety   . Diverticulitis   . GERD (gastroesophageal reflux disease)    intermittent, treated with tums  . Hiatal hernia   . Hypothyroidism   . Insomnia   . Lumbago   . Osteoporosis 2007   declined prolia  . Perforation of right tympanic membrane 09/03/2015   Chronic, seen by Dr Janace Hoard ENT   . Personal history of kidney stones    x1  . Postmenopausal atrophic vaginitis 2016   rec estrace cream - [place]  . Recurrent UTI   . Scoliosis   . Severe recurrent depression with psychosis ([place])   . Urinary retention   . Vitamin D deficiency     Past Surgical History:  Procedure Laterality Date  . ABDOMINAL HYSTERECTOMY  1979   ectopic pregnancy with one ovary removed  . APPENDECTOMY  1954  . CATARACT EXTRACTION, BILATERAL  11/2015  . CHOLECYSTECTOMY    . ESOPHAGOGASTRODUODENOSCOPY    . KNEE SURGERY Left 2012   L medial meniscal tear  . OOPHORECTOMY Right 1965   Ectopic pregnancy, removal of right ovary and tube- 1960  . ROTATOR CUFF REPAIR Right   . TONSILLECTOMY      Family History  Problem Relation Age of Onset  . Anxiety disorder Grandchild   . Depression Grandchild   . Cancer Sister        lung  . Healthy Sister   . Diabetes Mother   . Diabetes Father   . Schizophrenia Sister     . Diabetes Sister   . Diabetes Brother   . Cancer Sister        kidney  . Alcohol abuse Neg Hx   . Bipolar disorder Neg Hx   . Dementia Neg Hx   . Drug abuse Neg Hx   .  OCD Neg Hx   . Paranoid behavior Neg Hx   . Seizures Neg Hx   . Sexual abuse Neg Hx   . Physical abuse Neg Hx     Social History:  reports that she has never smoked. She has never used smokeless tobacco. She reports that she does not drink alcohol or use drugs.  REVIEW Of SYSTEMS:  She has had chronic depression  She tends to gain weight    Wt Readings from Last 3 Encounters:  11/19/16 189 lb 9.6 oz (86 kg)  11/18/16 186 lb (84.4 kg)  05/24/16 180 lb 8 oz (81.9 kg)     Examination:   BP 122/74   Pulse 88   Ht 5' 4.25" (1.632 m)   Wt 189 lb 9.6 oz (86 kg)   LMP 07/15/2012   SpO2 92%   BMI 32.29 kg/m   She looks well. Thyroid is not palpable Biceps reflexes appear normal No peripheral edema and no swelling of the eyes    Assessments/Treatment:  Hypothyroidism, primary, long-standing and without goiter Subjectively she is doing well and is compliant with her thyroid supplement daily and the morning She is taking 88 g, 6-1/2 tablets weekly of her generic preparation  Usually her thyroid functions have been consistent with this dose but her TSH is low normal This is despite her weight gain Recommend that she take the medication 6 days a week instead of 6-1/2 tablets per week  Will need to check her thyroid levels again in 6 months  OSTEOPOROSIS:   Would recommend getting a bone density and she should get this scheduled through her PCP.    VITAMIN D deficiency: She can take the prescription every other week since she has taken a regular weekly dose for a year now  WEIGHT gain: She needs to discuss this with her psychiatrist has it may be related to her Zyprexa  Labaron Digirolamo 11/19/2016, 8:32 AM

## 2016-11-18 NOTE — Patient Instructions (Signed)
Jamie Pitts , Thank you for taking time to come for your Medicare Wellness Visit. I appreciate your ongoing commitment to your health goals. Please review the following plan we discussed and let me know if I can assist you in the future.   These are the goals we discussed: Goals    . health management          Starting 11/18/2016, I will continue to take medications as prescribed and make healthy food choices.       This is a list of the screening recommended for you and due dates:  Health Maintenance  Topic Date Due  . DTaP/Tdap/Td vaccine (2 - Td) 12/28/2023  . Tetanus Vaccine  12/28/2023  . Flu Shot  Completed  . DEXA scan (bone density measurement)  Completed  . Pneumonia vaccines  Completed   Preventive Care for Adults  A healthy lifestyle and preventive care can promote health and wellness. Preventive health guidelines for adults include the following key practices.  . A routine yearly physical is a good way to check with your health care provider about your health and preventive screening. It is a chance to share any concerns and updates on your health and to receive a thorough exam.  . Visit your dentist for a routine exam and preventive care every 6 months. Brush your teeth twice a day and floss once a day. Good oral hygiene prevents tooth decay and gum disease.  . The frequency of eye exams is based on your age, health, family medical history, use  of contact lenses, and other factors. Follow your health care provider's ecommendations for frequency of eye exams.  . Eat a healthy diet. Foods like vegetables, fruits, whole grains, low-fat dairy products, and lean protein foods contain the nutrients you need without too many calories. Decrease your intake of foods high in solid fats, added sugars, and salt. Eat the right amount of calories for you. Get information about a proper diet from your health care provider, if necessary.  . Regular physical exercise is one of the  most important things you can do for your health. Most adults should get at least 150 minutes of moderate-intensity exercise (any activity that increases your heart rate and causes you to sweat) each week. In addition, most adults need muscle-strengthening exercises on 2 or more days a week.  Silver Sneakers may be a benefit available to you. To determine eligibility, you may visit the website: www.silversneakers.com or contact program at 213 737 9723 Mon-Fri between 8AM-8PM.   . Maintain a healthy weight. The body mass index (BMI) is a screening tool to identify possible weight problems. It provides an estimate of body fat based on height and weight. Your health care provider can find your BMI and can help you achieve or maintain a healthy weight.   For adults 20 years and older: ? A BMI below 18.5 is considered underweight. ? A BMI of 18.5 to 24.9 is normal. ? A BMI of 25 to 29.9 is considered overweight. ? A BMI of 30 and above is considered obese.   . Maintain normal blood lipids and cholesterol levels by exercising and minimizing your intake of saturated fat. Eat a balanced diet with plenty of fruit and vegetables. Blood tests for lipids and cholesterol should begin at age 71 and be repeated every 5 years. If your lipid or cholesterol levels are high, you are over 50, or you are at high risk for heart disease, you may need your cholesterol levels checked  more frequently. Ongoing high lipid and cholesterol levels should be treated with medicines if diet and exercise are not working.  . If you smoke, find out from your health care provider how to quit. If you do not use tobacco, please do not start.  . If you choose to drink alcohol, please do not consume more than 2 drinks per day. One drink is considered to be 12 ounces (355 mL) of beer, 5 ounces (148 mL) of wine, or 1.5 ounces (44 mL) of liquor.  . If you are 70-70 years old, ask your health care provider if you should take aspirin to  prevent strokes.  . Use sunscreen. Apply sunscreen liberally and repeatedly throughout the day. You should seek shade when your shadow is shorter than you. Protect yourself by wearing long sleeves, pants, a wide-brimmed hat, and sunglasses year round, whenever you are outdoors.  . Once a month, do a whole body skin exam, using a mirror to look at the skin on your back. Tell your health care provider of new moles, moles that have irregular borders, moles that are larger than a pencil eraser, or moles that have changed in shape or color.

## 2016-11-18 NOTE — Progress Notes (Signed)
Subjective:   Jamie Pitts is a 83 y.o. female who presents for an Initial Medicare Annual Wellness Visit.  Review of Systems    N/A  Cardiac Risk Factors include: advanced age (>35men, >23 women);obesity (BMI >30kg/m2)     Objective:    Today's Vitals   11/18/16 1005  BP: 118/64  Pulse: 89  Temp: 98.1 F (36.7 C)  TempSrc: Oral  SpO2: 92%  Weight: 186 lb (84.4 kg)  Height: 5' 3.75" (1.619 m)  PainSc: 0-No pain   Body mass index is 32.18 kg/m.   Current Medications (verified) Outpatient Encounter Prescriptions as of 11/18/2016  Medication Sig  . bisacodyl (DULCOLAX) 10 MG suppository Place 1 suppository (10 mg total) rectally daily as needed for moderate constipation.  . Calcium Carbonate-Vitamin D (CALCIUM-D PO) Take 1 tablet by mouth daily.  . clonazePAM (KLONOPIN) 1 MG tablet Take 1 tablet (1 mg total) by mouth at bedtime.  . cyclobenzaprine (FLEXERIL) 10 MG tablet Take 10 mg by mouth 3 (three) times daily as needed for muscle spasms.  Marland Kitchen dicyclomine (BENTYL) 10 MG capsule Take 1 capsule (10 mg total) by mouth daily as needed for spasms.  . fluticasone (FLONASE) 50 MCG/ACT nasal spray Place 2 sprays into both nostrils daily as needed for allergies or rhinitis.  Marland Kitchen HYDROcodone-acetaminophen (NORCO/VICODIN) 5-325 MG tablet Take 1 tablet by mouth every 6 (six) hours as needed for moderate pain.  . hydrocortisone (ANUSOL-HC) 25 MG suppository Place 1 suppository (25 mg total) rectally 2 (two) times daily as needed for hemorrhoids or itching.  . lamoTRIgine (LAMICTAL) 25 MG tablet Take 2 tablets (50 mg total) by mouth 2 (two) times daily.  Marland Kitchen levothyroxine (SYNTHROID, LEVOTHROID) 88 MCG tablet TAKE 1 TABLET EVERY MORNING (NEED OFFICE VISIT AFTER ONE REFILL)  . montelukast (SINGULAIR) 10 MG tablet TAKE 1 TABLET AT BEDTIME  . OLANZapine (ZYPREXA) 5 MG tablet Take one in the am , one at noon and two at bedtime  . PROAIR HFA 108 (90 Base) MCG/ACT inhaler USE 2 INHALATIONS EVERY  6 HOURS AS NEEDED FOR COUGH  . venlafaxine (EFFEXOR) 37.5 MG tablet Take 1 tablet (37.5 mg total) by mouth 2 (two) times daily.  . Vitamin D, Ergocalciferol, (DRISDOL) 50000 UNITS CAPS capsule TAKE 1 CAPSULE EVERY 7 DAYS  . levothyroxine (SYNTHROID, LEVOTHROID) 88 MCG tablet Take 1 tablet (88 mcg total) by mouth every morning. (Patient not taking: Reported on 11/18/2016)   No facility-administered encounter medications on file as of 11/18/2016.     Allergies (verified) Amoxicillin; Buprenorphine hcl; Morphine and related; Sulfonamide derivatives; and Valium [diazepam]   History: Past Medical History:  Diagnosis Date  . Adenomatous polyp    x1  . ALLERGIC RHINITIS 12/20/2006  . Chronic anxiety   . Diverticulitis   . GERD (gastroesophageal reflux disease)    intermittent, treated with tums  . Hiatal hernia   . Hypothyroidism   . Insomnia   . Lumbago   . Osteoporosis 2007   declined prolia  . Perforation of right tympanic membrane 09/03/2015   Chronic, seen by Dr Janace Hoard ENT   . Personal history of kidney stones    x1  . Postmenopausal atrophic vaginitis 2016   rec estrace cream - [place]  . Recurrent UTI   . Scoliosis   . Severe recurrent depression with psychosis ([place])   . Urinary retention   . Vitamin D deficiency    Past Surgical History:  Procedure Laterality Date  . ABDOMINAL HYSTERECTOMY  1979  ectopic pregnancy with one ovary removed  . APPENDECTOMY  1954  . CATARACT EXTRACTION, BILATERAL  11/2015  . CHOLECYSTECTOMY    . ESOPHAGOGASTRODUODENOSCOPY    . KNEE SURGERY Left 2012   L medial meniscal tear  . OOPHORECTOMY Right 1965   Ectopic pregnancy, removal of right ovary and tube- 1960  . ROTATOR CUFF REPAIR Right   . TONSILLECTOMY     Family History  Problem Relation Age of Onset  . Anxiety disorder Grandchild   . Depression Grandchild   . Cancer Sister        lung  . Healthy Sister   . Diabetes Mother   . Diabetes Father   . Schizophrenia Sister   .  Diabetes Sister   . Diabetes Brother   . Cancer Sister        kidney  . Alcohol abuse Neg Hx   . Bipolar disorder Neg Hx   . Dementia Neg Hx   . Drug abuse Neg Hx   . OCD Neg Hx   . Paranoid behavior Neg Hx   . Seizures Neg Hx   . Sexual abuse Neg Hx   . Physical abuse Neg Hx    Social History   Occupational History  . retired Market researcher    Social History Main Topics  . Smoking status: Never Smoker  . Smokeless tobacco: Never Used  . Alcohol use No  . Drug use: No  . Sexual activity: Not on file    Tobacco Counseling Counseling given: No   Activities of Daily Living In your present state of health, do you have any difficulty performing the following activities: 11/18/2016  Hearing? Y  Vision? N  Difficulty concentrating or making decisions? N  Walking or climbing stairs? Y  Comment Shortness of breath reported by patient  Dressing or bathing? N  Doing errands, shopping? N  Preparing Food and eating ? N  Using the Toilet? N  In the past six months, have you accidently leaked urine? N  Do you have problems with loss of bowel control? N  Managing your Medications? N  Managing your Finances? N  Housekeeping or managing your Housekeeping? N  Some recent data might be hidden    Immunizations and Health Maintenance Immunization History  Administered Date(s) Administered  . Influenza, High Dose Seasonal PF 01/07/2015, 12/26/2015  . Influenza,inj,Quad PF,6+ Mos 12/30/2012, 11/10/2013  . Pneumococcal Conjugate-13 05/24/2016  . Pneumococcal Polysaccharide-23 03/02/2013  . Tdap 12/27/2013   There are no preventive care reminders to display for this patient.  Patient Care Team: Ria Bush, MD as PCP - General (Family Medicine)  Indicate any recent Medical Services you may have received from other than [facility] providers in the past year (date may be approximate).     Assessment:   This is a routine wellness examination for Jamie Pitts.   Hearing/Vision screen   Hearing Screening   125Hz  250Hz  500Hz  1000Hz  2000Hz  3000Hz  4000Hz  6000Hz  8000Hz   Right ear:           Left ear:   0 0 0  0    Comments: Right ear - perforation. Regular visits with ENT   Dietary issues and exercise activities discussed: Current Exercise Habits: The patient does not participate in regular exercise at present, Exercise limited by: orthopedic condition(s)  Goals    . health management          Starting 11/18/2016, I will continue to take medications as prescribed and make healthy food choices.  Depression Screen PHQ 2/9 Scores 11/18/2016  PHQ - 2 Score 1  PHQ- 9 Score 2    Fall Risk Fall Risk  11/18/2016  Falls in the past year? Yes  Comment pt tripped over computer cords  Number falls in past yr: 1  Injury with Fall? No    Cognitive Function: MMSE - Mini Mental State Exam 11/18/2016  Orientation to time 5  Orientation to Place 5  Registration 3  Attention/ Calculation 0  Recall 3  Language- name 2 objects 0  Language- repeat 1  Language- follow 3 step command 3  Language- read & follow direction 0  Write a sentence 0  Copy design 0  Total score 20       PLEASE NOTE: A Mini-Cog screen was completed. Maximum score is 20. A value of 0 denotes this part of [place] MMSE was not completed or the patient failed this part of the Mini-Cog screening.   Mini-Cog Screening Orientation to Time - Max 5 pts Orientation to Place - Max 5 pts Registration - Max 3 pts Recall - Max 3 pts Language Repeat - Max 1 pts Language Follow 3 Step Command - Max 3 pts   Screening Tests Health Maintenance  Topic Date Due  . DTaP/Tdap/Td (2 - Td) 12/28/2023  . TETANUS/TDAP  12/28/2023  . INFLUENZA VACCINE  Completed  . DEXA SCAN  Completed  . PNA vac Low Risk Adult  Completed      Plan:  I have personally reviewed and addressed the Medicare Annual Wellness questionnaire and have noted the following in the patient's chart:  A. Medical and social history B. Use of  alcohol, tobacco or illicit drugs  C. Current medications and supplements D. Functional ability and status E.  Nutritional status F.  Physical activity G. Advance directives H. List of other physicians I.  Hospitalizations, surgeries, and ER visits in previous 12 months J.  [place] to include hearing, vision, cognitive, depression L. Referrals and appointments - none  In addition, I have reviewed and discussed with patient certain preventive protocols, quality metrics, and best practice recommendations. A written personalized care plan for preventive services as well as general preventive health recommendations were provided to patient.  See attached scanned questionnaire for additional information.   Signed,   Lindell Noe, MHA, BS, LPN Health Coach

## 2016-11-18 NOTE — Progress Notes (Signed)
PCP notes:   Health maintenance:  Flu vaccine - administered  Abnormal screenings:   Fall risk- hx of fall without injury Depression score: 2 Hearing - failed  Hearing Screening   125Hz  250Hz  500Hz  1000Hz  2000Hz  3000Hz  4000Hz  6000Hz  8000Hz   Right ear:           Left ear:   0 0 0  0    Comments: Right ear - perforation. Regular visits with ENT  Patient concerns:   Sleep management and spinal cord detachment - pt wants to discuss with PCP at next appt  Nurse concerns:  None  Next PCP appt:   11/25/16 @ 1030

## 2016-11-18 NOTE — Progress Notes (Signed)
I reviewed health advisor's note, was available for consultation, and agree with documentation and plan.  

## 2016-11-18 NOTE — Progress Notes (Signed)
Pre visit review using our clinic review tool, if applicable. No additional management support is needed unless otherwise documented below in the visit note. 

## 2016-11-19 ENCOUNTER — Encounter: Payer: Self-pay | Admitting: Endocrinology

## 2016-11-19 ENCOUNTER — Ambulatory Visit (INDEPENDENT_AMBULATORY_CARE_PROVIDER_SITE_OTHER): Payer: Medicare Other | Admitting: Endocrinology

## 2016-11-19 VITALS — BP 122/74 | HR 88 | Ht 64.25 in | Wt 189.6 lb

## 2016-11-19 DIAGNOSIS — E559 Vitamin D deficiency, unspecified: Secondary | ICD-10-CM

## 2016-11-19 DIAGNOSIS — E063 Autoimmune thyroiditis: Secondary | ICD-10-CM | POA: Diagnosis not present

## 2016-11-19 MED ORDER — LEVOTHYROXINE SODIUM 88 MCG PO TABS: ORAL_TABLET | ORAL | 1 refills | Status: DC

## 2016-11-19 NOTE — Patient Instructions (Signed)
Skip thyroid on Sundays  Take Vitamin D only every 2 weeks

## 2016-11-24 ENCOUNTER — Emergency Department (HOSPITAL_COMMUNITY): Payer: Medicare Other

## 2016-11-24 ENCOUNTER — Emergency Department (HOSPITAL_COMMUNITY)
Admission: EM | Admit: 2016-11-24 | Discharge: 2016-11-24 | Disposition: A | Discharge status: Home / Self Care | Payer: Medicare Other | Attending: Emergency Medicine | Admitting: Emergency Medicine

## 2016-11-24 ENCOUNTER — Encounter (HOSPITAL_COMMUNITY): Payer: Self-pay | Admitting: *Deleted

## 2016-11-24 DIAGNOSIS — M1990 Unspecified osteoarthritis, unspecified site: Secondary | ICD-10-CM | POA: Insufficient documentation

## 2016-11-24 DIAGNOSIS — E039 Hypothyroidism, unspecified: Secondary | ICD-10-CM | POA: Insufficient documentation

## 2016-11-24 DIAGNOSIS — M25561 Pain in right knee: Secondary | ICD-10-CM | POA: Diagnosis not present

## 2016-11-24 DIAGNOSIS — Z79899 Other long term (current) drug therapy: Secondary | ICD-10-CM | POA: Diagnosis not present

## 2016-11-24 DIAGNOSIS — J449 Chronic obstructive pulmonary disease, unspecified: Secondary | ICD-10-CM | POA: Insufficient documentation

## 2016-11-24 MED ORDER — HYDROMORPHONE HCL 4 MG PO TABS: 4.0000 mg | ORAL_TABLET | Freq: Four times a day (QID) | ORAL | 0 refills | Status: DC | PRN

## 2016-11-24 MED ORDER — HYDROCODONE-ACETAMINOPHEN 5-325 MG PO TABS
1.0000 | ORAL_TABLET | Freq: Once | ORAL | Status: AC
  Administered 2016-11-24: 1 via ORAL
  Filled 2016-11-24: qty 1

## 2016-11-24 MED ORDER — HYDROCODONE-ACETAMINOPHEN 5-325 MG PO TABS: 1.0000 | ORAL_TABLET | Freq: Four times a day (QID) | ORAL | 0 refills | Status: DC | PRN

## 2016-11-24 NOTE — Discharge Instructions (Signed)
Supplement to your pain management meds provided. Take as directed. Wear knee immobilizer. Make an appointment to follow-up with your doctor. Return for any new or worse symptoms.

## 2016-11-24 NOTE — ED Triage Notes (Signed)
Pt reports ongoing right knee pain and swelling. Pt denies injury, pt thinks its a medication reaction. No acute distress is noted at triage.

## 2016-11-24 NOTE — ED Provider Notes (Signed)
[facility] DEPT Provider Note   CSN: [id number] Arrival date & time: 11/24/16  0707     History   Chief Complaint Chief Complaint  Patient presents with  . Knee Pain    HPI Jamie Pitts is a 83 y.o. female.  A patient with acute onset of right knee pain swelling. Started yesterday. No prior history. No history of any injury. Patients on chronic pain meds and followed by pain management for chronic back pain. Patient denies a history of gout. Pain is severe in the right knee 10 out of 10.      Past Medical History:  Diagnosis Date  . Adenomatous polyp    x1  . ALLERGIC RHINITIS 12/20/2006  . Chronic anxiety   . Diverticulitis   . GERD (gastroesophageal reflux disease)    intermittent, treated with tums  . Hiatal hernia   . Hypothyroidism   . Insomnia   . Lumbago   . Osteoporosis 2007   declined prolia  . Perforation of right tympanic membrane 09/03/2015   Chronic, seen by Dr Janace Hoard ENT   . Personal history of kidney stones    x1  . Postmenopausal atrophic vaginitis 2016   rec estrace cream - [place]  . Recurrent UTI   . Scoliosis   . Severe recurrent depression with psychosis ([place])   . Urinary retention   . Vitamin D deficiency     Patient Active Problem List   Diagnosis Date Noted  . Abdominal cramping 05/24/2016  . Perforation of right tympanic membrane 09/03/2015  . COPD (chronic obstructive pulmonary disease) ([place]) 12/07/2014  . Constipation 05/08/2014  . Urinary retention 05/08/2014  . Urinary urgency 04/05/2014  . Chronic cough 03/02/2013  . Cerumen impaction 03/02/2013  . Dysphagia 11/05/2012  . Drug-induced Parkinsonism ([place]) 07/29/2012  . Insomnia secondary to depression with anxiety 05/22/2012  . Chronic back pain 04/14/2007  . Hypothyroidism 11/04/2006  . GERD 11/04/2006  . OSTEOPOROSIS 11/04/2006  . Depression, major, recurrent, severe with psychosis ([place]) 08/29/2006    Past Surgical History:  Procedure Laterality Date  .  ABDOMINAL HYSTERECTOMY  1979   ectopic pregnancy with one ovary removed  . APPENDECTOMY  1954  . CATARACT EXTRACTION, BILATERAL  11/2015  . CHOLECYSTECTOMY    . ESOPHAGOGASTRODUODENOSCOPY    . KNEE SURGERY Left 2012   L medial meniscal tear  . OOPHORECTOMY Right 1965   Ectopic pregnancy, removal of right ovary and tube- 1960  . ROTATOR CUFF REPAIR Right   . TONSILLECTOMY      OB History    No data available       Home Medications    Prior to Admission medications   Medication Sig Start Date End Date Taking? Authorizing Provider  bisacodyl (DULCOLAX) 10 MG suppository Place 1 suppository (10 mg total) rectally daily as needed for moderate constipation. 05/09/14   Hongalgi, Lenis Dickinson, MD  Calcium Carbonate-Vitamin D (CALCIUM-D PO) Take 1 tablet by mouth daily.    [provider]  clonazePAM (KLONOPIN) 1 MG tablet Take 1 tablet (1 mg total) by mouth at bedtime. 09/09/16 09/09/17  Cloria Spring, MD  cyclobenzaprine (FLEXERIL) 10 MG tablet Take 10 mg by mouth 3 (three) times daily as needed for muscle spasms.    [provider]  dicyclomine (BENTYL) 10 MG capsule Take 1 capsule (10 mg total) by mouth daily as needed for spasms. 05/24/16   Ria Bush, MD  fluticasone [facility]) 50 MCG/ACT nasal spray Place 2 sprays into both nostrils daily as  needed for allergies or rhinitis. 05/24/16   Ria Bush, MD  HYDROcodone-acetaminophen (NORCO/VICODIN) 5-325 MG tablet Take 1 tablet by mouth every 6 (six) hours as needed for moderate pain. 05/24/16   Ria Bush, MD  HYDROcodone-acetaminophen (NORCO/VICODIN) 5-325 MG tablet Take 1-2 tablets by mouth every 6 (six) hours as needed for moderate pain. 11/24/16   Fredia Sorrow, MD  hydrocortisone (ANUSOL-HC) 25 MG suppository Place 1 suppository (25 mg total) rectally 2 (two) times daily as needed for hemorrhoids or itching. 05/09/14   Hongalgi, Lenis Dickinson, MD  HYDROmorphone (DILAUDID) 4 MG tablet Take 1 tablet (4 mg total) by  mouth every 6 (six) hours as needed for severe pain. 11/24/16   Fredia Sorrow, MD  lamoTRIgine (LAMICTAL) 25 MG tablet Take 2 tablets (50 mg total) by mouth 2 (two) times daily. 09/09/16   Cloria Spring, MD  levothyroxine (SYNTHROID, LEVOTHROID) 88 MCG tablet 1 tablet before breakfast, 6 days a week 11/19/16   Elayne Snare, MD  montelukast (SINGULAIR) 10 MG tablet TAKE 1 TABLET AT BEDTIME 03/19/16   Ria Bush, MD  OLANZapine [facility]) 5 MG tablet Take one in the am , one at noon and two at bedtime 09/09/16   Cloria Spring, MD  PROAIR HFA 108 [phone] Base) MCG/ACT inhaler USE 2 INHALATIONS EVERY 6 HOURS AS NEEDED FOR COUGH 12/19/15   Ria Bush, MD  venlafaxine (EFFEXOR) 37.5 MG tablet Take 1 tablet (37.5 mg total) by mouth 2 (two) times daily. 09/09/16   Cloria Spring, MD  Vitamin D, Ergocalciferol, (DRISDOL) 50000 UNITS CAPS capsule TAKE 1 CAPSULE EVERY 7 DAYS 11/14/14   Elayne Snare, MD    Family History Family History  Problem Relation Age of Onset  . Anxiety disorder Grandchild   . Depression Grandchild   . Cancer Sister        lung  . Healthy Sister   . Diabetes Mother   . Diabetes Father   . Schizophrenia Sister   . Diabetes Sister   . Diabetes Brother   . Cancer Sister        kidney  . Alcohol abuse Neg Hx   . Bipolar disorder Neg Hx   . Dementia Neg Hx   . Drug abuse Neg Hx   . OCD Neg Hx   . Paranoid behavior Neg Hx   . Seizures Neg Hx   . Sexual abuse Neg Hx   . Physical abuse Neg Hx     Social History Social History  Substance Use Topics  . Smoking status: Never Smoker  . Smokeless tobacco: Never Used  . Alcohol use No     Allergies   Amoxicillin; Buprenorphine hcl; Morphine and related; Sulfonamide derivatives; and Valium [diazepam]   Review of Systems Review of Systems  Constitutional: Negative for fever.  HENT: Negative for congestion.   Eyes: Negative for redness.  Respiratory: Negative for shortness of breath.   Cardiovascular:  Negative for chest pain.  Gastrointestinal: Negative for abdominal pain.  Musculoskeletal: Positive for back pain and joint swelling.  Skin: Negative for rash.  Neurological: Negative for headaches.  Hematological: Does not bruise/bleed easily.  Psychiatric/Behavioral: Negative for confusion.     Physical Exam Updated Vital Signs BP (!) 154/84 (BP Location: Left Arm)   Pulse 91   Temp 98 F (36.7 C) (Oral)   Resp 18   LMP 07/15/2012   SpO2 95%   Physical Exam  Constitutional: She is oriented to person, place, and time. She appears well-developed and well-nourished.  No distress.  HENT:  Head: Normocephalic and atraumatic.  Mouth/Throat: Oropharynx is clear and moist.  Eyes: Pupils are equal, round, and reactive to light. Conjunctivae and EOM are normal.  Neck: Normal range of motion. Neck supple.  Cardiovascular: Normal rate and regular rhythm.   Pulmonary/Chest: Effort normal and breath sounds normal. No respiratory distress.  Abdominal: Soft. Bowel sounds are normal. There is no tenderness.  Musculoskeletal: Normal range of motion. She exhibits edema and tenderness. She exhibits no deformity.  Right knee with some increased warmth no erythema. No rash. Swelling and effusion present. Distally dorsalis pedis pulse to both lower extremities is 2+. No significant sensory deficit.  Neurological: She is alert and oriented to person, place, and time. No cranial nerve deficit or sensory deficit. She exhibits normal muscle tone. Coordination normal.  Skin: Skin is warm. No erythema.  Nursing note and vitals reviewed.    ED Treatments / Results  Labs (all labs ordered are listed, but only abnormal results are displayed) Labs Reviewed - No data to display  EKG  EKG Interpretation None       Radiology Dg Knee Complete 4 Views Right  Result Date: 11/24/2016 CLINICAL DATA:  Pt reports ongoing right knee pain and swelling. Pt denies injury, pt thinks its a medication reaction.  Pt c/o of medial knee pain. EXAM: RIGHT KNEE - COMPLETE 4+ VIEW COMPARISON:  None. FINDINGS: No fracture.  No bone lesion. The knee joint is normally spaced and aligned. Chondrocalcinosis extends across the menisci. Calcifications are noted in the suprapatellar joint space compartment associated with a small pleural effusion. A line of phleboliths is noted along the medial subcutaneous soft tissues. Soft tissues are otherwise unremarkable. IMPRESSION: 1. No fracture or bone lesion. 2. Chondrocalcinosis associated with a small joint effusion. Consider CPPD arthropathy. No other arthropathic changes. Electronically Signed   By: Lajean Manes M.D.   On: 11/24/2016 07:40    Procedures Procedures (including critical care time)  Medications Ordered in ED Medications  HYDROcodone-acetaminophen (NORCO/VICODIN) 5-325 MG per tablet 1 tablet (not administered)     Initial Impression / Assessment and Plan / ED Course  I have reviewed the triage vital signs and the nursing notes.  Pertinent labs & imaging results that were available during my care of the patient were reviewed by me and considered in my medical decision making (see chart for details).     Patient symptoms x-ray consistent with an acute inflammatory arthritis. No erythema. Patient already taking hydrocodone on a regular basis. She is under pain management so she has a limited supply. We'll give a additional prescription for hydrocodone so she can take to every 6 hours. And we'll supplement that with some hydromorphone. Also knee immobilizer. Follow-up with her doctor.  Final Clinical Impressions(s) / ED Diagnoses   Final diagnoses:  Acute pain of right knee  Inflammatory arthritis    New Prescriptions New Prescriptions   HYDROCODONE-ACETAMINOPHEN (NORCO/VICODIN) 5-325 MG TABLET    Take 1-2 tablets by mouth every 6 (six) hours as needed for moderate pain.   HYDROMORPHONE (DILAUDID) 4 MG TABLET    Take 1 tablet (4 mg total) by mouth  every 6 (six) hours as needed for severe pain.     Fredia Sorrow, MD 11/24/16 [phone]

## 2016-11-24 NOTE — ED Triage Notes (Signed)
Pt reports she took a zyprexa  On Friday night.  Pt reports her Rt knee was hurting when she woke up SAt. Morning. Pt denies any injury.  To Knee.  Pt takes pain meds for chronic pain.

## 2016-11-24 NOTE — ED Notes (Signed)
Patient in Xray

## 2016-11-25 ENCOUNTER — Encounter: Payer: Self-pay | Admitting: Family Medicine

## 2016-12-02 ENCOUNTER — Ambulatory Visit (HOSPITAL_COMMUNITY): Payer: Self-pay | Admitting: Psychiatry

## 2016-12-03 DIAGNOSIS — M25561 Pain in right knee: Secondary | ICD-10-CM | POA: Diagnosis not present

## 2016-12-05 DIAGNOSIS — M25561 Pain in right knee: Secondary | ICD-10-CM | POA: Diagnosis not present

## 2016-12-05 DIAGNOSIS — M47816 Spondylosis without myelopathy or radiculopathy, lumbar region: Secondary | ICD-10-CM | POA: Diagnosis not present

## 2016-12-13 ENCOUNTER — Encounter: Payer: Self-pay | Admitting: Family Medicine

## 2016-12-16 ENCOUNTER — Ambulatory Visit (INDEPENDENT_AMBULATORY_CARE_PROVIDER_SITE_OTHER): Payer: Medicare Other | Admitting: Psychiatry

## 2016-12-16 ENCOUNTER — Encounter (HOSPITAL_COMMUNITY): Payer: Self-pay | Admitting: Psychiatry

## 2016-12-16 VITALS — BP 128/75 | HR 85 | Ht 64.25 in | Wt 181.0 lb

## 2016-12-16 DIAGNOSIS — F419 Anxiety disorder, unspecified: Secondary | ICD-10-CM

## 2016-12-16 DIAGNOSIS — R45851 Suicidal ideations: Secondary | ICD-10-CM | POA: Diagnosis not present

## 2016-12-16 DIAGNOSIS — R450 Nervousness: Secondary | ICD-10-CM

## 2016-12-16 DIAGNOSIS — F341 Dysthymic disorder: Secondary | ICD-10-CM | POA: Diagnosis not present

## 2016-12-16 DIAGNOSIS — G20 Parkinson's disease: Secondary | ICD-10-CM | POA: Diagnosis not present

## 2016-12-16 DIAGNOSIS — F332 Major depressive disorder, recurrent severe without psychotic features: Secondary | ICD-10-CM

## 2016-12-16 DIAGNOSIS — R5381 Other malaise: Secondary | ICD-10-CM

## 2016-12-16 DIAGNOSIS — Z818 Family history of other mental and behavioral disorders: Secondary | ICD-10-CM

## 2016-12-16 DIAGNOSIS — R441 Visual hallucinations: Secondary | ICD-10-CM

## 2016-12-16 DIAGNOSIS — F29 Unspecified psychosis not due to a substance or known physiological condition: Secondary | ICD-10-CM

## 2016-12-16 DIAGNOSIS — R5383 Other fatigue: Secondary | ICD-10-CM

## 2016-12-16 MED ORDER — VENLAFAXINE HCL 37.5 MG PO TABS: 37.5000 mg | ORAL_TABLET | Freq: Two times a day (BID) | ORAL | 2 refills | Status: DC

## 2016-12-16 MED ORDER — LAMOTRIGINE 25 MG PO TABS: 50.0000 mg | ORAL_TABLET | Freq: Two times a day (BID) | ORAL | 2 refills | Status: DC

## 2016-12-16 MED ORDER — ZIPRASIDONE HCL 40 MG PO CAPS: ORAL_CAPSULE | ORAL | 2 refills | Status: DC

## 2016-12-16 MED ORDER — CLONAZEPAM 1 MG PO TABS: 1.0000 mg | ORAL_TABLET | Freq: Every day | ORAL | 2 refills | Status: DC

## 2016-12-16 NOTE — Progress Notes (Signed)
[facility] MD/PA/NP OP Progress Note  12/16/2016 2:57 PM Jamie Pitts  MRN:  [id number]  Chief Complaint:  Chief Complaint    Anxiety; Hallucinations     HPI: This patient is a 83 year old married white female who lives with her husband in [place]. She has one daughter and 3 grandchildren. She is retired from the [employer].  Apparently the patient had some sort of psychotic episode in 2013. She was admitted to [facility] because she was hearing voices at home. The voices got so bad that she cannot put a gun under the bed and wanted to shoot him. The patient is a poor historian and is hard of hearing and gets easily confused. She was placed on various medicines in the hospital which have been continued. It's unclear if she is medication compliant. For example she's not taking the Cogentin. She repeats herself a lot.  The patient states she still hearing voices every day. They're not frightening her like they were before but there seemed to her and she can't always get them to stop. She sleeping pretty well she denies being depressed but she is obviously not thinking clearly. She mentions that she's been diagnosed with parkinsonism but she doesn't have a tremor.  The patient returns after 3 months. Last time I saw her she stated that she was doing well and the voices and noises in her ears at stopped. She was very upbeat. However now she tells me that she stopped the olanzapine approximately a month ago. She stated that it was causing urinary incontinence and she was "peeing all over myself constantly." She states that she couldn't take it anymore and rather and call me to discuss that she took herself off the medication which was a total dosage of 20 mg a day. Over the last couple of weeks the voices and noises have come back. She states that it's very disturbing to feels like a roaring in her ears. She's no longer having the urinary incontinence but she cannot stand the noises and  voices. She's been crying and upset in today's states that she's had passive suicidal ideation but claims she would never actually hurt her self. She has had trials on Seroquel which also caused urinary problems and Risperdal which stopped working after we kept pushing the dosage up. I suggested we try Geodon and she is agreeable Visit Diagnosis:    ICD-10-CM   1. ANXIETY DEPRESSION F34.1 lamoTRIgine (LAMICTAL) 25 MG tablet  2. Psychosis, unspecified psychosis type ([place]) F29     Past Psychiatric History: Past hospitalization for psychotic symptoms in 2013  Past Medical History:  Past Medical History:  Diagnosis Date  . Adenomatous polyp    x1  . ALLERGIC RHINITIS 12/20/2006  . Chronic anxiety   . Diverticulitis   . GERD (gastroesophageal reflux disease)    intermittent, treated with tums  . Hiatal hernia   . Hypothyroidism   . Insomnia   . Lumbago   . Osteoporosis 2007   declined prolia  . Perforation of right tympanic membrane 09/03/2015   Chronic, seen by Dr Janace Hoard ENT   . Personal history of kidney stones    x1  . Postmenopausal atrophic vaginitis 2016   rec estrace cream - [place]  . Recurrent UTI   . Scoliosis   . Severe recurrent depression with psychosis ([place])   . Urinary retention   . Vitamin D deficiency     Past Surgical History:  Procedure Laterality Date  . ABDOMINAL HYSTERECTOMY  1979  ectopic pregnancy with one ovary removed  . APPENDECTOMY  1954  . CATARACT EXTRACTION, BILATERAL  11/2015  . CHOLECYSTECTOMY    . ESOPHAGOGASTRODUODENOSCOPY    . KNEE SURGERY Left 2012   L medial meniscal tear  . OOPHORECTOMY Right 1965   Ectopic pregnancy, removal of right ovary and tube- 1960  . ROTATOR CUFF REPAIR Right   . TONSILLECTOMY      Family Psychiatric History: See below  Family History:  Family History  Problem Relation Age of Onset  . Anxiety disorder Grandchild   . Depression Grandchild   . Cancer Sister        lung  . Healthy Sister   . Diabetes  Mother   . Diabetes Father   . Schizophrenia Sister   . Diabetes Sister   . Diabetes Brother   . Cancer Sister        kidney  . Alcohol abuse Neg Hx   . Bipolar disorder Neg Hx   . Dementia Neg Hx   . Drug abuse Neg Hx   . OCD Neg Hx   . Paranoid behavior Neg Hx   . Seizures Neg Hx   . Sexual abuse Neg Hx   . Physical abuse Neg Hx     Social History:  Social History   Social History  . Marital status: Married    Spouse name: N/A  . Number of children: 1  . Years of education: N/A   Occupational History  . retired Market researcher    Social History Main Topics  . Smoking status: Never Smoker  . Smokeless tobacco: Never Used  . Alcohol use No  . Drug use: No  . Sexual activity: Not Asked   Other Topics Concern  . None   Social History Narrative   Lives with husband and grandson [name])   Occ: retired, Sales executive   Edu: College          Allergies:  Allergies  Allergen Reactions  . Amoxicillin Hives    Sores in mouth  . Buprenorphine Hcl Itching    Can tolerate if necessary  . Morphine And Related Itching    Can tolerate if necessary  . Sulfonamide Derivatives Hives    Sores in mouth  . Valium [Diazepam] Itching    Can tolerate if necessary    Metabolic Disorder Labs: No results found for: HGBA1C, MPG No results found for: PROLACTIN Lab Results  Component Value Date   CHOL 208 (H) 11/18/2016   TRIG 248.0 (H) 11/18/2016   HDL 43.10 11/18/2016   CHOLHDL 5 11/18/2016   VLDL 49.6 (H) 11/18/2016   Lab Results  Component Value Date   TSH 0.64 11/13/2016   TSH 2.70 12/26/2015    Therapeutic Level Labs: No results found for: LITHIUM No results found for: VALPROATE No components found for:  CBMZ  Current Medications: Current Outpatient Prescriptions  Medication Sig Dispense Refill  . bisacodyl (DULCOLAX) 10 MG suppository Place 1 suppository (10 mg total) rectally daily as needed for moderate constipation. 12 suppository 0  . Calcium  Carbonate-Vitamin D (CALCIUM-D PO) Take 1 tablet by mouth daily.    . clonazePAM (KLONOPIN) 1 MG tablet Take 1 tablet (1 mg total) by mouth at bedtime. 90 tablet 2  . cyclobenzaprine (FLEXERIL) 10 MG tablet Take 10 mg by mouth 3 (three) times daily as needed for muscle spasms.    Marland Kitchen dicyclomine (BENTYL) 10 MG capsule Take 1 capsule (10 mg total) by mouth daily as needed for spasms.  90 capsule 1  . fluticasone (FLONASE) 50 MCG/ACT nasal spray Place 2 sprays into both nostrils daily as needed for allergies or rhinitis. 16 g 3  . HYDROcodone-acetaminophen (NORCO/VICODIN) 5-325 MG tablet Take 1 tablet by mouth every 6 (six) hours as needed for moderate pain.    Marland Kitchen HYDROcodone-acetaminophen (NORCO/VICODIN) 5-325 MG tablet Take 1-2 tablets by mouth every 6 (six) hours as needed for moderate pain. 14 tablet 0  . hydrocortisone (ANUSOL-HC) 25 MG suppository Place 1 suppository (25 mg total) rectally 2 (two) times daily as needed for hemorrhoids or itching. 12 suppository 0  . HYDROmorphone (DILAUDID) 4 MG tablet Take 1 tablet (4 mg total) by mouth every 6 (six) hours as needed for severe pain. 14 tablet 0  . lamoTRIgine (LAMICTAL) 25 MG tablet Take 2 tablets (50 mg total) by mouth 2 (two) times daily. 360 tablet 2  . levothyroxine (SYNTHROID, LEVOTHROID) 88 MCG tablet 1 tablet before breakfast, 6 days a week 90 tablet 1  . montelukast (SINGULAIR) 10 MG tablet TAKE 1 TABLET AT BEDTIME 90 tablet 0  . PROAIR HFA 108 (90 Base) MCG/ACT inhaler USE 2 INHALATIONS EVERY 6 HOURS AS NEEDED FOR COUGH 25.5 g 1  . venlafaxine (EFFEXOR) 37.5 MG tablet Take 1 tablet (37.5 mg total) by mouth 2 (two) times daily. 180 tablet 2  . Vitamin D, Ergocalciferol, (DRISDOL) 50000 UNITS CAPS capsule TAKE 1 CAPSULE EVERY 7 DAYS 12 capsule 2  . ziprasidone (GEODON) 40 MG capsule Take one with dinner for one week than go up to one with breakfast and one with dinner 60 capsule 2   No current facility-administered medications for this  visit.      Musculoskeletal: Strength & Muscle Tone: within normal limits Gait & Station: normal Patient leans: N/A  Psychiatric Specialty Exam: Review of Systems  Constitutional: Positive for malaise/fatigue.  Psychiatric/Behavioral: Positive for depression and hallucinations. The patient is nervous/anxious.   All other systems reviewed and are negative.   Blood pressure 128/75, pulse 85, height 5' 4.25" (1.632 m), weight 181 lb (82.1 kg), last menstrual period 07/15/2012.Body mass index is 30.83 kg/m.  General Appearance: Casual, Neat and Well Groomed  Eye Contact:  Fair  Speech:  Normal Rate  Volume:  Normal  Mood:  Anxious and Depressed  Affect:  Constricted, Depressed and Tearful  Thought Process:  Goal Directed  Orientation:  Full (Time, Place, and Person)  Thought Content: Hallucinations: Auditory   Suicidal Thoughts:  Yes.  without intent/plan  Homicidal Thoughts:  No  Memory:  Immediate;   Good Recent;   Fair Remote;   Fair  Judgement:  Poor  Insight:  Lacking  Psychomotor Activity:  Restlessness  Concentration:  Concentration: Poor and Attention Span: Poor  Recall:  [place] of Knowledge: Good  Language: Good  Akathisia:  No  Handed:  Right  AIMS (if indicated): not done  Assets:  Communication Skills Desire for Improvement Resilience Social Support Talents/Skills  ADL's:  Intact  Cognition: WNL  Sleep:  Good   Screenings: Mini-Mental     Clinical Support from 11/18/2016 in [place] at [facility]  Total Score (max 30 points )  20    PHQ2-9     Clinical Support from 11/18/2016 in [place] at [facility] Total Score  1  PHQ-9 Total Score  2       Assessment and Plan: This patient is a 83 year old white female with a history of major depression with psychosis. Her psychotic  symptoms are mostly in the form of auditory hallucinations which are vague and difficult for her to describe. They sound as if there are loud  roaring voices and noises that cannot be deciphered. Since abruptly stopping her Zyprexa without warning the symptoms have recurred. She has tried several other antipsychotics. We will go for a trial of Geodon starting at 40 mg daily for 1 week and then advancing to 40 mg twice a day for the psychotic symptoms. She'll continue Lamictal for mood stabilization and Effexor for depression and she'll also continue clonazepam at bedtime for sleep and anxiety. She will return to see me in 4 weeks but call within 2 weeks if her symptoms are not starting to improve or if they get worse and she needs hospitalization   Levonne Spiller, MD 12/16/2016, 2:57 PM

## 2016-12-17 DIAGNOSIS — M25561 Pain in right knee: Secondary | ICD-10-CM | POA: Diagnosis not present

## 2016-12-31 DIAGNOSIS — G894 Chronic pain syndrome: Secondary | ICD-10-CM | POA: Diagnosis not present

## 2016-12-31 DIAGNOSIS — M47816 Spondylosis without myelopathy or radiculopathy, lumbar region: Secondary | ICD-10-CM | POA: Diagnosis not present

## 2016-12-31 DIAGNOSIS — M546 Pain in thoracic spine: Secondary | ICD-10-CM | POA: Diagnosis not present

## 2016-12-31 DIAGNOSIS — M5134 Other intervertebral disc degeneration, thoracic region: Secondary | ICD-10-CM | POA: Diagnosis not present

## 2017-01-02 ENCOUNTER — Telehealth (HOSPITAL_COMMUNITY): Payer: Self-pay | Admitting: *Deleted

## 2017-01-02 ENCOUNTER — Other Ambulatory Visit (HOSPITAL_COMMUNITY): Payer: Self-pay | Admitting: Psychiatry

## 2017-01-02 MED ORDER — ZIPRASIDONE HCL 40 MG PO CAPS: ORAL_CAPSULE | ORAL | 2 refills | Status: DC

## 2017-01-02 NOTE — Telephone Encounter (Signed)
Pt still hearing noises, will increase geodon to 40 mg qam, 80 mg at bedtime

## 2017-01-02 NOTE — Telephone Encounter (Signed)
Spoke with patient & notified of new medication dosage changes

## 2017-01-02 NOTE — Telephone Encounter (Signed)
Patient  Called stating that it is very important that she speaks with Dr Harrington Challenger. And ask if Dr. Harrington Challenger will please call her.

## 2017-01-13 ENCOUNTER — Telehealth (HOSPITAL_COMMUNITY): Payer: Self-pay | Admitting: *Deleted

## 2017-01-13 ENCOUNTER — Ambulatory Visit (HOSPITAL_COMMUNITY): Payer: Self-pay | Admitting: Psychiatry

## 2017-01-13 NOTE — Telephone Encounter (Signed)
Thanks

## 2017-01-13 NOTE — Telephone Encounter (Signed)
Patient missed appointment today & after a lot of back forth conversations between her, myself  & Dr Harrington Challenger. Dr Harrington Challenger requested a call to check up on patient. Spoke with [name] & she has the flu  & has been sick since Thanksgiving. [name] will call to reschedule once she feels better

## 2017-01-16 ENCOUNTER — Encounter: Payer: Self-pay | Admitting: Family Medicine

## 2017-01-20 ENCOUNTER — Emergency Department (HOSPITAL_COMMUNITY)
Admission: EM | Admit: 2017-01-20 | Discharge: 2017-01-20 | Disposition: A | Discharge status: Home / Self Care | Payer: Medicare Other | Attending: Emergency Medicine | Admitting: Emergency Medicine

## 2017-01-20 ENCOUNTER — Encounter (HOSPITAL_COMMUNITY): Payer: Self-pay | Admitting: Emergency Medicine

## 2017-01-20 DIAGNOSIS — Z5321 Procedure and treatment not carried out due to patient leaving prior to being seen by health care provider: Secondary | ICD-10-CM | POA: Insufficient documentation

## 2017-01-20 DIAGNOSIS — R404 Transient alteration of awareness: Secondary | ICD-10-CM | POA: Diagnosis not present

## 2017-01-20 DIAGNOSIS — R110 Nausea: Secondary | ICD-10-CM | POA: Insufficient documentation

## 2017-01-20 DIAGNOSIS — R531 Weakness: Secondary | ICD-10-CM | POA: Diagnosis not present

## 2017-01-20 LAB — COMPREHENSIVE METABOLIC PANEL
ALBUMIN: 3.2 g/dL — AB (ref 3.5–5.0)
ALK PHOS: 123 U/L (ref 38–126)
ALT: 14 U/L (ref 14–54)
AST: 20 U/L (ref 15–41)
Anion gap: 12 (ref 5–15)
BILIRUBIN TOTAL: 0.9 mg/dL (ref 0.3–1.2)
BUN: 14 mg/dL (ref 6–20)
CALCIUM: 9.2 mg/dL (ref 8.9–10.3)
CO2: 20 mmol/L — AB (ref 22–32)
Chloride: 98 mmol/L — ABNORMAL LOW (ref 101–111)
Creatinine, Ser: 1.09 mg/dL — ABNORMAL HIGH (ref 0.44–1.00)
GFR calc Af Amer: 54 mL/min — ABNORMAL LOW (ref >60)
GFR calc non Af Amer: 46 mL/min — ABNORMAL LOW (ref >60)
GLUCOSE: 102 mg/dL — AB (ref 65–99)
Potassium: 4.4 mmol/L (ref 3.5–5.1)
SODIUM: 130 mmol/L — AB (ref 135–145)
TOTAL PROTEIN: 7.1 g/dL (ref 6.5–8.1)

## 2017-01-20 LAB — CBC
HEMATOCRIT: 36.3 % (ref 36.0–46.0)
HEMOGLOBIN: 11.9 g/dL — AB (ref 12.0–15.0)
MCH: 27.8 pg (ref 26.0–34.0)
MCHC: 32.8 g/dL (ref 30.0–36.0)
MCV: 84.8 fL (ref 78.0–100.0)
Platelets: 513 10*3/uL — ABNORMAL HIGH (ref 150–400)
RBC: 4.28 MIL/uL (ref 3.87–5.11)
RDW: 13.6 % (ref 11.5–15.5)
WBC: 15.2 10*3/uL — ABNORMAL HIGH (ref 4.0–10.5)

## 2017-01-20 LAB — LIPASE, BLOOD: Lipase: 30 U/L (ref 11–51)

## 2017-01-20 MED ORDER — ONDANSETRON 4 MG PO TBDP
4.0000 mg | ORAL_TABLET | Freq: Once | ORAL | Status: AC | PRN
Start: 2017-01-20 — End: 2017-01-20
  Administered 2017-01-20: 4 mg via ORAL
  Filled 2017-01-20: qty 1

## 2017-01-20 NOTE — ED Triage Notes (Signed)
Patient c/o nausea x10 days. Denies V/D. Reports she "had the flu" last week but did not see her doctor. Denies cough, congestion, body aches, and abdominal pain.

## 2017-01-20 NOTE — ED Triage Notes (Signed)
Per EMS-states flu like symptoms for over 10 days-states Nyquil not working anymore-has not seen PCP-nausea, vomited this am-diarrhea earlier in week-BP 130/70, HR 70, CGG 132

## 2017-01-20 NOTE — ED Notes (Signed)
Pt called from the lobby with no response 

## 2017-01-20 NOTE — ED Notes (Signed)
Per registration, patient left. 

## 2017-01-23 DIAGNOSIS — R1084 Generalized abdominal pain: Secondary | ICD-10-CM | POA: Diagnosis not present

## 2017-01-23 DIAGNOSIS — E039 Hypothyroidism, unspecified: Secondary | ICD-10-CM | POA: Diagnosis not present

## 2017-01-23 DIAGNOSIS — R509 Fever, unspecified: Secondary | ICD-10-CM | POA: Diagnosis not present

## 2017-01-23 DIAGNOSIS — R110 Nausea: Secondary | ICD-10-CM | POA: Diagnosis not present

## 2017-01-23 DIAGNOSIS — R0602 Shortness of breath: Secondary | ICD-10-CM | POA: Diagnosis not present

## 2017-01-24 ENCOUNTER — Ambulatory Visit: Payer: Self-pay | Admitting: Family Medicine

## 2017-03-04 ENCOUNTER — Ambulatory Visit (INDEPENDENT_AMBULATORY_CARE_PROVIDER_SITE_OTHER): Payer: Medicare Other | Admitting: Family Medicine

## 2017-03-04 ENCOUNTER — Encounter: Payer: Self-pay | Admitting: Family Medicine

## 2017-03-04 VITALS — BP 122/62 | HR 103 | Temp 98.0°F | Wt 172.0 lb

## 2017-03-04 DIAGNOSIS — R110 Nausea: Secondary | ICD-10-CM | POA: Diagnosis not present

## 2017-03-04 DIAGNOSIS — E039 Hypothyroidism, unspecified: Secondary | ICD-10-CM

## 2017-03-04 DIAGNOSIS — M545 Low back pain: Secondary | ICD-10-CM | POA: Diagnosis not present

## 2017-03-04 DIAGNOSIS — F5105 Insomnia due to other mental disorder: Secondary | ICD-10-CM | POA: Diagnosis not present

## 2017-03-04 DIAGNOSIS — G8929 Other chronic pain: Secondary | ICD-10-CM

## 2017-03-04 DIAGNOSIS — F333 Major depressive disorder, recurrent, severe with psychotic symptoms: Secondary | ICD-10-CM | POA: Diagnosis not present

## 2017-03-04 DIAGNOSIS — R109 Unspecified abdominal pain: Secondary | ICD-10-CM

## 2017-03-04 DIAGNOSIS — F418 Other specified anxiety disorders: Secondary | ICD-10-CM

## 2017-03-04 LAB — CBC WITH DIFFERENTIAL/PLATELET
BASOS ABS: 0.1 10*3/uL (ref 0.0–0.1)
Basophils Relative: 0.4 % (ref 0.0–3.0)
EOS ABS: 0.1 10*3/uL (ref 0.0–0.7)
Eosinophils Relative: 0.5 % (ref 0.0–5.0)
HCT: 42.3 % (ref 36.0–46.0)
HEMOGLOBIN: 13.6 g/dL (ref 12.0–15.0)
LYMPHS PCT: 14.5 % (ref 12.0–46.0)
Lymphs Abs: 1.9 10*3/uL (ref 0.7–4.0)
MCHC: 32 g/dL (ref 30.0–36.0)
MCV: 88.7 fl (ref 78.0–100.0)
MONO ABS: 0.9 10*3/uL (ref 0.1–1.0)
Monocytes Relative: 7.2 % (ref 3.0–12.0)
Neutro Abs: 9.9 10*3/uL — ABNORMAL HIGH (ref 1.4–7.7)
Neutrophils Relative %: 77.4 % — ABNORMAL HIGH (ref 43.0–77.0)
Platelets: 332 10*3/uL (ref 150.0–400.0)
RBC: 4.77 Mil/uL (ref 3.87–5.11)
RDW: 15.1 % (ref 11.5–15.5)
WBC: 12.7 10*3/uL — AB (ref 4.0–10.5)

## 2017-03-04 LAB — COMPREHENSIVE METABOLIC PANEL
ALBUMIN: 4.2 g/dL (ref 3.5–5.2)
ALK PHOS: 77 U/L (ref 39–117)
ALT: 11 U/L (ref 0–35)
AST: 14 U/L (ref 0–37)
BUN: 13 mg/dL (ref 6–23)
CALCIUM: 9.7 mg/dL (ref 8.4–10.5)
CO2: 27 mEq/L (ref 19–32)
CREATININE: 1.11 mg/dL (ref 0.40–1.20)
Chloride: 98 mEq/L (ref 96–112)
GFR: 50.09 mL/min — ABNORMAL LOW (ref >60.00)
Glucose, Bld: 106 mg/dL — ABNORMAL HIGH (ref 70–99)
Potassium: 4 mEq/L (ref 3.5–5.1)
SODIUM: 135 meq/L (ref 135–145)
TOTAL PROTEIN: 7.6 g/dL (ref 6.0–8.3)
Total Bilirubin: 0.4 mg/dL (ref 0.2–1.2)

## 2017-03-04 MED ORDER — ONDANSETRON HCL 4 MG PO TABS: 4.0000 mg | ORAL_TABLET | Freq: Three times a day (TID) | ORAL | 0 refills | Status: DC | PRN

## 2017-03-04 NOTE — Patient Instructions (Addendum)
Urinalysis today Labs today Nausea medicine sent to pharmacy Keep follow up appointment with Dr Harrington Challenger tomorrow.  Return in 1 month for medicare wellness follow up visit.

## 2017-03-04 NOTE — Progress Notes (Signed)
BP 122/62 (BP Location: Left Arm, Patient Position: Sitting, Cuff Size: Normal)   Pulse (!) 103   Temp 98 F (36.7 C) (Oral)   Wt 172 lb (78 kg)   LMP 07/15/2012   SpO2 94%   BMI 28.62 kg/m    CC: psych concerns, nausea Subjective:    Patient ID: Jamie Pitts, female    DOB: 1933-06-25, 84 y.o.   MRN: [id number]  HPI: Jamie Pitts is a 84 y.o. female presenting on 03/04/2017 for Follow-up (Psych follow-up)   Known anxiety and MDD with psychosis/auditory hallucinations followed by Dr Harrington Challenger. Latest started on geodon up to 40/80mg  daily. She also takes lamictal and effexor as well as klonopin for sleep.   "My anxiety is so bad that I'm having trouble functioning" worsening over the last several weeks. Some concerns about "spinal cord detachment" followed by Dr Nelva Bush.   Ongoing nausea with constipation. Infrequent vomiting. Denies abdominal pain, diarrhea, dysphagia, early satiety. Denies dysuria, urgency, frequency. No fevers/chills, flank pain, blood in stool or urine. No recent antibiotics. Dicyclomine helps abd cramping but not current symptoms. Denies chest pain, tightness, dyspnea. No cough. Last BM was this morning, small. Passing gas fine.   Currently taking effexor, klonopin, lamictal.  She stopped geodon over the last few months due to worsening incontinence (although they did help hallucinations).   Takes hydrocodone for back pain prescribed by Dr Nelva Bush.  Has appt tomorrow with psych Dr Harrington Challenger.   Relevant past medical, surgical, family and social history reviewed and updated as indicated. Interim medical history since our last visit reviewed. Allergies and medications reviewed and updated. Outpatient Medications Prior to Visit  Medication Sig Dispense Refill  . bisacodyl (DULCOLAX) 10 MG suppository Place 1 suppository (10 mg total) rectally daily as needed for moderate constipation. 12 suppository 0  . Calcium Carbonate-Vitamin D (CALCIUM-D PO) Take 1 tablet by mouth  daily.    . clonazePAM (KLONOPIN) 1 MG tablet Take 1 tablet (1 mg total) by mouth at bedtime. 90 tablet 2  . dicyclomine (BENTYL) 10 MG capsule Take 1 capsule (10 mg total) by mouth daily as needed for spasms. 90 capsule 1  . fluticasone (FLONASE) 50 MCG/ACT nasal spray Place 2 sprays into both nostrils daily as needed for allergies or rhinitis. 16 g 3  . HYDROcodone-acetaminophen (NORCO/VICODIN) 5-325 MG tablet Take 1 tablet by mouth every 6 (six) hours as needed for moderate pain.    . hydrocortisone (ANUSOL-HC) 25 MG suppository Place 1 suppository (25 mg total) rectally 2 (two) times daily as needed for hemorrhoids or itching. 12 suppository 0  . lamoTRIgine (LAMICTAL) 25 MG tablet Take 2 tablets (50 mg total) by mouth 2 (two) times daily. 360 tablet 2  . levothyroxine (SYNTHROID, LEVOTHROID) 88 MCG tablet 1 tablet before breakfast, 6 days a week 90 tablet 1  . montelukast (SINGULAIR) 10 MG tablet TAKE 1 TABLET AT BEDTIME 90 tablet 0  . PROAIR HFA 108 (90 Base) MCG/ACT inhaler USE 2 INHALATIONS EVERY 6 HOURS AS NEEDED FOR COUGH 25.5 g 1  . venlafaxine (EFFEXOR) 37.5 MG tablet Take 1 tablet (37.5 mg total) by mouth 2 (two) times daily. 180 tablet 2  . Vitamin D, Ergocalciferol, (DRISDOL) 50000 UNITS CAPS capsule TAKE 1 CAPSULE EVERY 7 DAYS 12 capsule 2  . ziprasidone (GEODON) 40 MG capsule Take one in the am and two at bedtime 90 capsule 2  . HYDROcodone-acetaminophen (NORCO/VICODIN) 5-325 MG tablet Take 1-2 tablets by mouth every 6 (six) hours as needed  for moderate pain. 14 tablet 0  . HYDROmorphone (DILAUDID) 4 MG tablet Take 1 tablet (4 mg total) by mouth every 6 (six) hours as needed for severe pain. 14 tablet 0  . cyclobenzaprine (FLEXERIL) 10 MG tablet Take 10 mg by mouth 3 (three) times daily as needed for muscle spasms.     No facility-administered medications prior to visit.      Per HPI unless specifically indicated in ROS section below Review of Systems     Objective:    BP  122/62 (BP Location: Left Arm, Patient Position: Sitting, Cuff Size: Normal)   Pulse (!) 103   Temp 98 F (36.7 C) (Oral)   Wt 172 lb (78 kg)   LMP 07/15/2012   SpO2 94%   BMI 28.62 kg/m   Wt Readings from Last 3 Encounters:  03/04/17 172 lb (78 kg)  01/20/17 180 lb (81.6 kg)  12/16/16 181 lb (82.1 kg)    Physical Exam  Constitutional: She appears well-developed and well-nourished. No distress.  HENT:  Mouth/Throat: Oropharynx is clear and moist.  Neck: Normal range of motion. Neck supple.  Cardiovascular: Normal rate, regular rhythm, normal heart sounds and intact distal pulses.  No murmur heard. Pulmonary/Chest: Effort normal and breath sounds normal. No respiratory distress. She has no wheezes. She has no rales.  Abdominal: Soft. Normal appearance and bowel sounds are normal. She exhibits no distension and no mass. There is no hepatosplenomegaly. There is no tenderness. There is no rigidity, no rebound, no guarding, no CVA tenderness and negative Murphy's sign.  Musculoskeletal: She exhibits no edema.  Lymphadenopathy:    She has no cervical adenopathy.  Skin: Skin is warm and dry. No rash noted.  Psychiatric: Her mood appears anxious. She is actively hallucinating (auditory - hearing music today).  High anxiety today  Nursing note and vitals reviewed.  Results for orders placed or performed in visit on 03/04/17  Comprehensive metabolic panel  Result Value Ref Range   Sodium 135 135 - 145 mEq/L   Potassium 4.0 3.5 - 5.1 mEq/L   Chloride 98 96 - 112 mEq/L   CO2 27 19 - 32 mEq/L   Glucose, Bld 106 (H) 70 - 99 mg/dL   BUN 13 6 - 23 mg/dL   Creatinine, Ser 1.11 0.40 - 1.20 mg/dL   Total Bilirubin 0.4 0.2 - 1.2 mg/dL   Alkaline Phosphatase 77 39 - 117 U/L   AST 14 0 - 37 U/L   ALT 11 0 - 35 U/L   Total Protein 7.6 6.0 - 8.3 g/dL   Albumin 4.2 3.5 - 5.2 g/dL   Calcium 9.7 8.4 - 10.5 mg/dL   GFR 50.09 (L) >60.00 mL/min  CBC with Differential/Platelet  Result Value Ref  Range   WBC 12.7 (H) 4.0 - 10.5 K/uL   RBC 4.77 3.87 - 5.11 Mil/uL   Hemoglobin 13.6 12.0 - 15.0 g/dL   HCT 42.3 36.0 - 46.0 %   MCV 88.7 78.0 - 100.0 fl   MCHC 32.0 30.0 - 36.0 g/dL   RDW 15.1 11.5 - 15.5 %   Platelets 332.0 150.0 - 400.0 K/uL   Neutrophils Relative % 77.4 (H) 43.0 - 77.0 %   Lymphocytes Relative 14.5 12.0 - 46.0 %   Monocytes Relative 7.2 3.0 - 12.0 %   Eosinophils Relative 0.5 0.0 - 5.0 %   Basophils Relative 0.4 0.0 - 3.0 %   Neutro Abs 9.9 (H) 1.4 - 7.7 K/uL   Lymphs Abs 1.9  0.7 - 4.0 K/uL   Monocytes Absolute 0.9 0.1 - 1.0 K/uL   Eosinophils Absolute 0.1 0.0 - 0.7 K/uL   Basophils Absolute 0.1 0.0 - 0.1 K/uL   Lab Results  Component Value Date   TSH 0.64 11/13/2016       Assessment & Plan:  She missed her AMW f/u visit with me - advised to return in 1 month for this.  Problem List Items Addressed This Visit    Abdominal cramping    ?IBS. This is well managed with PRN dicyclomine.       Chronic back pain    Somewhat nonsensical description of her back problems. She does have evident scoliosis. I have not received records from Dr Nelva Bush.      Depression, major, recurrent, severe with psychosis ([place])    This is her main concern today. She has stopped geodon and has been intolerant to atypicals in the past due to increased urinary urgency/incontinence. Encouraged f/u with psych tomorrow.       Hypothyroidism    Lab Results  Component Value Date   TSH 0.64 11/13/2016   followed by endo Dr Dwyane Dee      Insomnia secondary to depression with anxiety (Chronic)    Encouraged f/u with psych.      Nausea - Primary    Ongoing, of unclear cause, denies abdominal pain with this or largely any other symptoms. Benign exam today. Some weight loss noted. Anxiety may be contributing. Check labwork, UA today - pt unable to provide sample, sent home with sterile urine collection cup and will return.  Rx zofran PRN nausea.       Relevant Orders   Comprehensive  metabolic panel (Completed)   CBC with Differential/Platelet (Completed)       Follow up plan: Return in about 4 weeks (around 04/01/2017) for follow up visit.  Ria Bush, MD

## 2017-03-05 ENCOUNTER — Encounter (HOSPITAL_COMMUNITY): Payer: Self-pay | Admitting: Psychiatry

## 2017-03-05 ENCOUNTER — Ambulatory Visit (INDEPENDENT_AMBULATORY_CARE_PROVIDER_SITE_OTHER): Payer: Medicare Other | Admitting: Psychiatry

## 2017-03-05 VITALS — BP 120/78 | HR 82 | Ht 65.0 in | Wt 171.0 lb

## 2017-03-05 DIAGNOSIS — F341 Dysthymic disorder: Secondary | ICD-10-CM

## 2017-03-05 DIAGNOSIS — F419 Anxiety disorder, unspecified: Secondary | ICD-10-CM

## 2017-03-05 DIAGNOSIS — H9190 Unspecified hearing loss, unspecified ear: Secondary | ICD-10-CM

## 2017-03-05 DIAGNOSIS — R110 Nausea: Secondary | ICD-10-CM

## 2017-03-05 DIAGNOSIS — F29 Unspecified psychosis not due to a substance or known physiological condition: Secondary | ICD-10-CM

## 2017-03-05 DIAGNOSIS — Z818 Family history of other mental and behavioral disorders: Secondary | ICD-10-CM

## 2017-03-05 DIAGNOSIS — R109 Unspecified abdominal pain: Secondary | ICD-10-CM | POA: Diagnosis not present

## 2017-03-05 DIAGNOSIS — R443 Hallucinations, unspecified: Secondary | ICD-10-CM | POA: Diagnosis not present

## 2017-03-05 DIAGNOSIS — R450 Nervousness: Secondary | ICD-10-CM | POA: Diagnosis not present

## 2017-03-05 LAB — POC URINALSYSI DIPSTICK (AUTOMATED)
Bilirubin, UA: NEGATIVE
Glucose, UA: NEGATIVE
Ketones, UA: NEGATIVE
NITRITE UA: NEGATIVE
PH UA: 7 (ref 5.0–8.0)
Protein, UA: NEGATIVE
SPEC GRAV UA: 1.015 (ref 1.010–1.025)
UROBILINOGEN UA: 0.2 U/dL

## 2017-03-05 MED ORDER — LAMOTRIGINE 25 MG PO TABS: 50.0000 mg | ORAL_TABLET | Freq: Two times a day (BID) | ORAL | 2 refills | Status: DC

## 2017-03-05 MED ORDER — VENLAFAXINE HCL 37.5 MG PO TABS: 37.5000 mg | ORAL_TABLET | Freq: Two times a day (BID) | ORAL | 2 refills | Status: DC

## 2017-03-05 MED ORDER — CLONAZEPAM 1 MG PO TABS: 1.0000 mg | ORAL_TABLET | Freq: Every day | ORAL | 2 refills | Status: DC

## 2017-03-05 MED ORDER — QUETIAPINE FUMARATE 25 MG PO TABS: 25.0000 mg | ORAL_TABLET | Freq: Two times a day (BID) | ORAL | 2 refills | Status: DC

## 2017-03-05 NOTE — Assessment & Plan Note (Signed)
?  IBS. This is well managed with PRN dicyclomine.

## 2017-03-05 NOTE — Addendum Note (Signed)
Addended by: Brenton Grills on: 0/71/2197 58:83 PM   Modules accepted: Orders

## 2017-03-05 NOTE — Assessment & Plan Note (Signed)
Somewhat nonsensical description of her back problems. She does have evident scoliosis. I have not received records from Dr Nelva Bush.

## 2017-03-05 NOTE — Assessment & Plan Note (Signed)
Lab Results  Component Value Date   TSH 0.64 11/13/2016   followed by endo Dr Dwyane Dee

## 2017-03-05 NOTE — Progress Notes (Signed)
BH MD/PA/NP OP Progress Note  03/05/2017 4:16 PM Jamie Pitts  MRN:  [id number]  Chief Complaint:  Chief Complaint    Hallucinations; Anxiety; Depression; Follow-up     HPI: This patient is a 84 year old married white female who lives with her husband in [place]. She has one daughter and 3 grandchildren. She is retired from the [employer].  Apparently the patient had some sort of psychotic episode in 2013. She was admitted to [facility] because she was hearing voices at home. The voices got so bad that she cannot put a gun under the bed and wanted to shoot him. The patient is a poor historian and is hard of hearing and gets easily confused. She was placed on various medicines in the hospital which have been continued. It's unclear if she is medication compliant. For example she's not taking the Cogentin. She repeats herself a lot.  The patient states she still hearing voices every day. They're not frightening her like they were before but there seemed to her and she can't always get them to stop. She sleeping pretty well she denies being depressed but she is obviously not thinking clearly. She mentions that she's been diagnosed with parkinsonism but she doesn't have a tremor.  She returns after 3 months.  She states that she is not doing well lately.  She stopped the Geodon a few weeks ago without telling me.  She claimed it caused urinary incontinence.  She had the same difficulty with Zyprexa.  She was doing well on Risperdal for a long time but she stated it caused her to have an inability to move or function.  She is back to hearing the music all the time and it is bothering her.  She is very anxious and shaky.  She denies any suicidal thoughts or any thoughts of self-harm.  She does look rather tremulous and anxious right now Visit Diagnosis:    ICD-10-CM   1. Psychosis, unspecified psychosis type ([place]) F29   2. ANXIETY DEPRESSION F34.1 lamoTRIgine (LAMICTAL) 25 MG tablet     Past Psychiatric History: Hospitalization in 2013 for  auditory hallucinations and depression  Past Medical History:  Past Medical History:  Diagnosis Date  . Adenomatous polyp    x1  . ALLERGIC RHINITIS 12/20/2006  . Chronic anxiety   . Diverticulitis   . GERD (gastroesophageal reflux disease)    intermittent, treated with tums  . Hiatal hernia   . Hypothyroidism   . Insomnia   . Lumbago   . Osteoporosis 2007   declined prolia  . Perforation of right tympanic membrane 09/03/2015   Chronic, seen by Dr Janace Hoard ENT   . Personal history of kidney stones    x1  . Postmenopausal atrophic vaginitis 2016   rec estrace cream - [place]  . Recurrent UTI   . Scoliosis   . Severe recurrent depression with psychosis ([place])   . Urinary retention   . Vitamin D deficiency     Past Surgical History:  Procedure Laterality Date  . ABDOMINAL HYSTERECTOMY  1979   ectopic pregnancy with one ovary removed  . APPENDECTOMY  1954  . CATARACT EXTRACTION, BILATERAL  11/2015  . CHOLECYSTECTOMY    . ESOPHAGOGASTRODUODENOSCOPY    . KNEE SURGERY Left 2012   L medial meniscal tear  . OOPHORECTOMY Right 1965   Ectopic pregnancy, removal of right ovary and tube- 1960  . ROTATOR CUFF REPAIR Right   . TONSILLECTOMY      Family Psychiatric History:  See below  Family History:  Family History  Problem Relation Age of Onset  . Anxiety disorder Grandchild   . Depression Grandchild   . Cancer Sister        lung  . Healthy Sister   . Diabetes Mother   . Diabetes Father   . Schizophrenia Sister   . Diabetes Sister   . Diabetes Brother   . Cancer Sister        kidney  . Alcohol abuse Neg Hx   . Bipolar disorder Neg Hx   . Dementia Neg Hx   . Drug abuse Neg Hx   . OCD Neg Hx   . Paranoid behavior Neg Hx   . Seizures Neg Hx   . Sexual abuse Neg Hx   . Physical abuse Neg Hx     Social History:  Social History   Socioeconomic History  . Marital status: Married    Spouse name: None  .  Number of children: 1  . Years of education: None  . Highest education level: None  Social Needs  . Financial resource strain: None  . Food insecurity - worry: None  . Food insecurity - inability: None  . Transportation needs - medical: None  . Transportation needs - non-medical: None  Occupational History  . Occupation: retired Market researcher  Tobacco Use  . Smoking status: Never Smoker  . Smokeless tobacco: Never Used  Substance and Sexual Activity  . Alcohol use: No  . Drug use: No  . Sexual activity: None  Other Topics Concern  . None  Social History Narrative   Lives with husband and grandson [name])   Occ: retired, Sales executive   Edu: College          Allergies:  Allergies  Allergen Reactions  . Amoxicillin Hives    Sores in mouth  . Buprenorphine Hcl Itching    Can tolerate if necessary  . Morphine And Related Itching    Can tolerate if necessary  . Sulfonamide Derivatives Hives    Sores in mouth  . Valium [Diazepam] Itching    Can tolerate if necessary    Metabolic Disorder Labs: No results found for: HGBA1C, MPG No results found for: PROLACTIN Lab Results  Component Value Date   CHOL 208 (H) 11/18/2016   TRIG 248.0 (H) 11/18/2016   HDL 43.10 11/18/2016   CHOLHDL 5 11/18/2016   VLDL 49.6 (H) 11/18/2016   Lab Results  Component Value Date   TSH 0.64 11/13/2016   TSH 2.70 12/26/2015    Therapeutic Level Labs: No results found for: LITHIUM No results found for: VALPROATE No components found for:  CBMZ  Current Medications: Current Outpatient Medications  Medication Sig Dispense Refill  . bisacodyl (DULCOLAX) 10 MG suppository Place 1 suppository (10 mg total) rectally daily as needed for moderate constipation. 12 suppository 0  . Calcium Carbonate-Vitamin D (CALCIUM-D PO) Take 1 tablet by mouth daily.    . clonazePAM (KLONOPIN) 1 MG tablet Take 1 tablet (1 mg total) by mouth at bedtime. 30 tablet 2  . dicyclomine (BENTYL) 10 MG capsule Take 1  capsule (10 mg total) by mouth daily as needed for spasms. 90 capsule 1  . fluticasone (FLONASE) 50 MCG/ACT nasal spray Place 2 sprays into both nostrils daily as needed for allergies or rhinitis. 16 g 3  . HYDROcodone-acetaminophen (NORCO/VICODIN) 5-325 MG tablet Take 1 tablet by mouth every 6 (six) hours as needed for moderate pain.    . hydrocortisone (ANUSOL-HC) 25 MG  suppository Place 1 suppository (25 mg total) rectally 2 (two) times daily as needed for hemorrhoids or itching. 12 suppository 0  . lamoTRIgine (LAMICTAL) 25 MG tablet Take 2 tablets (50 mg total) by mouth 2 (two) times daily. 360 tablet 2  . levothyroxine (SYNTHROID, LEVOTHROID) 88 MCG tablet 1 tablet before breakfast, 6 days a week 90 tablet 1  . montelukast (SINGULAIR) 10 MG tablet TAKE 1 TABLET AT BEDTIME 90 tablet 0  . ondansetron (ZOFRAN) 4 MG tablet Take 1 tablet (4 mg total) by mouth every 8 (eight) hours as needed for nausea or vomiting. 20 tablet 0  . PROAIR HFA 108 (90 Base) MCG/ACT inhaler USE 2 INHALATIONS EVERY 6 HOURS AS NEEDED FOR COUGH 25.5 g 1  . venlafaxine (EFFEXOR) 37.5 MG tablet Take 1 tablet (37.5 mg total) by mouth 2 (two) times daily. 180 tablet 2  . Vitamin D, Ergocalciferol, (DRISDOL) 50000 UNITS CAPS capsule TAKE 1 CAPSULE EVERY 7 DAYS 12 capsule 2  . ziprasidone (GEODON) 40 MG capsule Take one in the am and two at bedtime 90 capsule 2  . QUEtiapine (SEROQUEL) 25 MG tablet Take 1 tablet (25 mg total) by mouth 2 (two) times daily. 60 tablet 2   No current facility-administered medications for this visit.      Musculoskeletal: Strength & Muscle Tone: within normal limits Gait & Station: normal Patient leans: N/A  Psychiatric Specialty Exam: Review of Systems  HENT: Positive for hearing loss.   Gastrointestinal: Positive for nausea.  Psychiatric/Behavioral: Positive for hallucinations. The patient is nervous/anxious.   All other systems reviewed and are negative.   Blood pressure 120/78, pulse  82, height 5\' 5"  (1.651 m), weight 171 lb (77.6 kg), last menstrual period 07/15/2012, SpO2 93 %.Body mass index is 28.46 kg/m.  General Appearance: Casual, Neat and Well Groomed  Eye Contact:  Fair  Speech:  Clear and Coherent  Volume:  Normal  Mood:  Anxious  Affect:  Constricted and Depressed  Thought Process:  Goal Directed  Orientation:  Full (Time, Place, and Person)  Thought Content: Hallucinations: Auditory and Rumination   Suicidal Thoughts:  No  Homicidal Thoughts:  No  Memory:  Immediate;   Good Recent;   Fair Remote;   Fair  Judgement:  Poor  Insight:  Lacking  Psychomotor Activity:  Tremor  Concentration:  Concentration: Poor and Attention Span: Poor  Recall:  [place] of Knowledge: Good  Language: Good  Akathisia:  No  Handed:  Right  AIMS (if indicated): not done  Assets:  Communication Skills Desire for Improvement Resilience Social Support Talents/Skills  ADL's:  Intact  Cognition: WNL  Sleep:  Good   Screenings: Mini-Mental     Clinical Support from 11/18/2016 in [place] at [facility]  Total Score (max 30 points )  20    PHQ2-9     Clinical Support from 11/18/2016 in [place] at [facility]  PHQ-2 Total Score  1  PHQ-9 Total Score  2       Assessment and Plan: Patient is an 84 year old female with psychotic depression.  Unfortunately over the last year or so she is not been able to tolerate anything antipsychotics I have given her.  She has not yet tried Seroquel so we will start a low dose-25 mg at bedtime and then gradually advance to 25 mg twice a day.  She will continue on Lamictal 50 mg twice a day for mood stabilization, Effexor XR 37.5 mg twice a day for  depression and clonazepam 1 mg at bedtime for anxiety.  She will return to see me in 4 weeks but call sooner if she has difficulties with the Seroquel   Levonne Spiller, MD 03/05/2017, 4:16 PM

## 2017-03-05 NOTE — Assessment & Plan Note (Signed)
Encouraged f/u with psych.

## 2017-03-05 NOTE — Assessment & Plan Note (Addendum)
Ongoing, of unclear cause, denies abdominal pain with this or largely any other symptoms. Benign exam today. Some weight loss noted. Anxiety may be contributing. Check labwork, UA today - pt unable to provide sample, sent home with sterile urine collection cup and will return.  Rx zofran PRN nausea.

## 2017-03-05 NOTE — Addendum Note (Signed)
Addended by: Brenton Grills on: 2/00/3794 44:61 AM   Modules accepted: Orders

## 2017-03-05 NOTE — Assessment & Plan Note (Addendum)
This is her main concern today. She has stopped geodon and has been intolerant to atypicals in the past due to increased urinary urgency/incontinence. Encouraged f/u with psych tomorrow.

## 2017-03-06 DIAGNOSIS — M545 Low back pain: Secondary | ICD-10-CM | POA: Diagnosis not present

## 2017-03-06 DIAGNOSIS — Z79891 Long term (current) use of opiate analgesic: Secondary | ICD-10-CM | POA: Diagnosis not present

## 2017-03-06 DIAGNOSIS — G894 Chronic pain syndrome: Secondary | ICD-10-CM | POA: Insufficient documentation

## 2017-03-07 ENCOUNTER — Other Ambulatory Visit: Payer: Self-pay | Admitting: Family Medicine

## 2017-03-07 ENCOUNTER — Telehealth: Payer: Self-pay

## 2017-03-07 MED ORDER — CIPROFLOXACIN HCL 250 MG PO TABS: 250.0000 mg | ORAL_TABLET | Freq: Two times a day (BID) | ORAL | 0 refills | Status: DC

## 2017-03-07 NOTE — Progress Notes (Signed)
cipro 250mg  BID x 5 days for E coli UTI, Ucx sensitivities pending.  Lab Results  Component Value Date   CREATININE 1.11 03/04/2017

## 2017-03-07 NOTE — Telephone Encounter (Signed)
Left message on vm per dpr notifying pt rx for Cipro was sent to [place].

## 2017-03-08 ENCOUNTER — Encounter: Payer: Self-pay | Admitting: Family Medicine

## 2017-03-08 LAB — URINE CULTURE
MICRO NUMBER:: 90066527
SPECIMEN QUALITY:: ADEQUATE

## 2017-03-31 ENCOUNTER — Emergency Department (HOSPITAL_COMMUNITY): Payer: Medicare Other

## 2017-03-31 ENCOUNTER — Encounter (HOSPITAL_COMMUNITY): Payer: Self-pay

## 2017-03-31 ENCOUNTER — Emergency Department (HOSPITAL_COMMUNITY)
Admission: EM | Admit: 2017-03-31 | Discharge: 2017-03-31 | Disposition: A | Discharge status: Home / Self Care | Payer: Medicare Other | Attending: Emergency Medicine | Admitting: Emergency Medicine

## 2017-03-31 ENCOUNTER — Other Ambulatory Visit: Payer: Self-pay

## 2017-03-31 DIAGNOSIS — Z79899 Other long term (current) drug therapy: Secondary | ICD-10-CM | POA: Insufficient documentation

## 2017-03-31 DIAGNOSIS — K644 Residual hemorrhoidal skin tags: Secondary | ICD-10-CM

## 2017-03-31 DIAGNOSIS — R1013 Epigastric pain: Secondary | ICD-10-CM | POA: Diagnosis present

## 2017-03-31 DIAGNOSIS — J449 Chronic obstructive pulmonary disease, unspecified: Secondary | ICD-10-CM | POA: Diagnosis not present

## 2017-03-31 DIAGNOSIS — K5900 Constipation, unspecified: Secondary | ICD-10-CM | POA: Diagnosis not present

## 2017-03-31 DIAGNOSIS — E039 Hypothyroidism, unspecified: Secondary | ICD-10-CM | POA: Insufficient documentation

## 2017-03-31 DIAGNOSIS — K449 Diaphragmatic hernia without obstruction or gangrene: Secondary | ICD-10-CM | POA: Diagnosis not present

## 2017-03-31 LAB — COMPREHENSIVE METABOLIC PANEL
ALT: 11 U/L — AB (ref 14–54)
AST: 25 U/L (ref 15–41)
Albumin: 3.5 g/dL (ref 3.5–5.0)
Alkaline Phosphatase: 72 U/L (ref 38–126)
Anion gap: 13 (ref 5–15)
BUN: 12 mg/dL (ref 6–20)
CHLORIDE: 98 mmol/L — AB (ref 101–111)
CO2: 22 mmol/L (ref 22–32)
Calcium: 9.5 mg/dL (ref 8.9–10.3)
Creatinine, Ser: 1.21 mg/dL — ABNORMAL HIGH (ref 0.44–1.00)
GFR calc Af Amer: 47 mL/min — ABNORMAL LOW (ref >60)
GFR calc non Af Amer: 41 mL/min — ABNORMAL LOW (ref >60)
Glucose, Bld: 92 mg/dL (ref 65–99)
POTASSIUM: 4.1 mmol/L (ref 3.5–5.1)
SODIUM: 133 mmol/L — AB (ref 135–145)
Total Bilirubin: 0.8 mg/dL (ref 0.3–1.2)
Total Protein: 6.7 g/dL (ref 6.5–8.1)

## 2017-03-31 LAB — CBC WITH DIFFERENTIAL/PLATELET
BASOS ABS: 0 10*3/uL (ref 0.0–0.1)
Basophils Relative: 0 %
Eosinophils Absolute: 0.1 10*3/uL (ref 0.0–0.7)
Eosinophils Relative: 1 %
HEMATOCRIT: 44 % (ref 36.0–46.0)
Hemoglobin: 14.4 g/dL (ref 12.0–15.0)
LYMPHS ABS: 2.5 10*3/uL (ref 0.7–4.0)
LYMPHS PCT: 20 %
MCH: 29.1 pg (ref 26.0–34.0)
MCHC: 32.7 g/dL (ref 30.0–36.0)
MCV: 88.9 fL (ref 78.0–100.0)
MONO ABS: 1 10*3/uL (ref 0.1–1.0)
Monocytes Relative: 8 %
Neutro Abs: 8.7 10*3/uL — ABNORMAL HIGH (ref 1.7–7.7)
Neutrophils Relative %: 71 %
Platelets: 335 10*3/uL (ref 150–400)
RBC: 4.95 MIL/uL (ref 3.87–5.11)
RDW: 14.3 % (ref 11.5–15.5)
WBC: 12.2 10*3/uL — ABNORMAL HIGH (ref 4.0–10.5)

## 2017-03-31 LAB — URINALYSIS, ROUTINE W REFLEX MICROSCOPIC
BACTERIA UA: NONE SEEN
Bilirubin Urine: NEGATIVE
Glucose, UA: NEGATIVE mg/dL
KETONES UR: 5 mg/dL — AB
LEUKOCYTES UA: NEGATIVE
Nitrite: NEGATIVE
PH: 8 (ref 5.0–8.0)
PROTEIN: NEGATIVE mg/dL
Specific Gravity, Urine: 1.015 (ref 1.005–1.030)

## 2017-03-31 LAB — I-STAT CG4 LACTIC ACID, ED
Lactic Acid, Venous: 1.94 mmol/L — ABNORMAL HIGH (ref 0.5–1.9)
Lactic Acid, Venous: 1.09 mmol/L (ref 0.5–1.9)

## 2017-03-31 LAB — LIPASE, BLOOD: LIPASE: 31 U/L (ref 11–51)

## 2017-03-31 MED ORDER — LIDOCAINE 5 % EX PTCH
1.0000 | MEDICATED_PATCH | CUTANEOUS | Status: DC
  Filled 2017-03-31: qty 1

## 2017-03-31 MED ORDER — SODIUM CHLORIDE 0.9 % IV BOLUS (SEPSIS)
500.0000 mL | Freq: Once | INTRAVENOUS | Status: AC
  Administered 2017-03-31: 500 mL via INTRAVENOUS

## 2017-03-31 MED ORDER — IOPAMIDOL (ISOVUE-300) INJECTION 61%
INTRAVENOUS | Status: AC
  Administered 2017-03-31: 75 mL via INTRAVENOUS
  Filled 2017-03-31: qty 75

## 2017-03-31 MED ORDER — POLYETHYLENE GLYCOL 3350 17 G PO PACK: 17.0000 g | PACK | Freq: Every day | ORAL | 0 refills | Status: DC

## 2017-03-31 NOTE — Discharge Instructions (Signed)
Please read and follow all provided instructions  Your diagnoses today includes: constipation and external hemorrhoids.   Tests performed today include: CT of your abdomen that shows a large amount of stool in your abdomen Vital signs. See below for your results today.   To help reduce constipation and promote bowel health, 1. Drink at least 64 ounces of water each day (increase water intake); 2. Eat plenty of fiber -i.e. fruits, vegetables, whole grains, legumes (increase fiber intake). If you are not able to eat foods high in fiber, you may use Benefiber or Metamucil over-the-counter. 3. Get plenty of physical activity  If needed, you may also use daily or as needed, MiraLax (Osmotic Laxative) up to  1-2 times a day and Colace (Stool Softener aka Docusate) 100 mg up to twice a day to help with bowel movements. These medications are over the counter.  MiraLax is an Osmotic You may use other over-the-counter medications such as Dulcolax, Fleet enemas, magnesium citrate as needed for constipation. Please note that some of these medications may cause you to have abdominal cramping which is normal. If you develop severe abdominal pain, fever, vomiting, distention of your abdomen, unable to have a bowel movement for 5 days or are not passing gas, please return to the hospital.  Return instructions:  Please return to the Emergency Department if you experience worsening symptoms.  Please return if you have worsening abdominal pain, swelling of your abdomen, persistent vomiting, blood in your stool or vomit, or fever.  Please return if you have any other emergent concerns.  Additional Information: You are also seen here today for your hemorrhoids.  These did not appear to be thrombosed at this time.  Please follow-up with [place] surgery as needed.  I have provided a attached handout.  Please do sitz bath multiple times per day for relief of these.  Your vital signs today were: BP (!)  157/91    Pulse 96    Temp 98.6 F (37 C) (Oral)    Resp 13    LMP 07/15/2012    SpO2 93%  If your blood pressure (BP) was elevated above 135/85 this visit, please have this repeated by your doctor within one month. ---------------

## 2017-03-31 NOTE — ED Notes (Signed)
Patient placed on PureWick female external catheter. 

## 2017-03-31 NOTE — ED Triage Notes (Signed)
Patient complaining of impaction and severe rectal pain x 4 days. No relief with stool softners. Patient pale and diaphoretic on arrival

## 2017-03-31 NOTE — ED Notes (Signed)
Patient transported to ct

## 2017-03-31 NOTE — ED Provider Notes (Signed)
[facility] EMERGENCY DEPARTMENT Provider Note   CSN: [id number] Arrival date & time: 03/31/17  1246     History   Chief Complaint No chief complaint on file.   HPI Jamie Pitts is a 84 y.o. female with a history of diverticulitis, hiatal hernia, kidney stones who presents ED today for constipation and external hemorrhoids.  Patient states that she is followed by [facility] surgery, Dr. Johney Maine, for external hemorrhoids.  She notes that over the last 1 week she has had increased pain and swelling to her external hemorrhoids.  She has never had her external hemorrhoids incised or had previous surgeries for this.  She notes that over the last 4 days she has had sensation of flatus and has not had any bowel movements.  The patient notes she has been taking stool softeners for this without any relief.  She notes that she has large amounts of gas and distention in her abdomen but denies any associated pain.  She denies any associated fever, chills, nausea, vomiting, diarrhea, hematochezia or melena.  She does note that she has burning with urination as well as increased urinary frequency.  Denies any urinary urgency, hematuria or flank pain.  Patient has past abdominal surgical history of abdominal hysterectomy, appendectomy, cholecystectomy and appendectomy.  HPI  Past Medical History:  Diagnosis Date  . Adenomatous polyp    x1  . ALLERGIC RHINITIS 12/20/2006  . Chronic anxiety   . Chronic back pain 04/14/2007   02/2017 [facility] records reviewed - mild lumbar DDD and L SIJ pain s/p ESI latest 10/2016 with good effect  . Diverticulitis   . GERD (gastroesophageal reflux disease)    intermittent, treated with tums  . Hiatal hernia   . Hypothyroidism   . Insomnia   . Osteoporosis 2007   declined prolia  . Perforation of right tympanic membrane 09/03/2015   Chronic, seen by Dr Janace Hoard ENT   . Personal history of kidney stones    x1  . Postmenopausal atrophic  vaginitis 2016   rec estrace cream - [place]  . Recurrent UTI   . Scoliosis   . Severe recurrent depression with psychosis ([place])   . Urinary retention   . Vitamin D deficiency     Patient Active Problem List   Diagnosis Date Noted  . Nausea 03/04/2017  . Abdominal cramping 05/24/2016  . Perforation of right tympanic membrane 09/03/2015  . COPD (chronic obstructive pulmonary disease) ([place]) 12/07/2014  . Constipation 05/08/2014  . Urinary retention 05/08/2014  . Urinary urgency 04/05/2014  . Chronic cough 03/02/2013  . Cerumen impaction 03/02/2013  . Dysphagia 11/05/2012  . Drug-induced Parkinsonism ([place]) 07/29/2012  . Insomnia secondary to depression with anxiety 05/22/2012  . Chronic back pain 04/14/2007  . Hypothyroidism 11/04/2006  . GERD 11/04/2006  . OSTEOPOROSIS 11/04/2006  . Depression, major, recurrent, severe with psychosis ([place]) 08/29/2006    Past Surgical History:  Procedure Laterality Date  . ABDOMINAL HYSTERECTOMY  1979   ectopic pregnancy with one ovary removed  . APPENDECTOMY  1954  . CATARACT EXTRACTION, BILATERAL  11/2015  . CHOLECYSTECTOMY    . ESOPHAGOGASTRODUODENOSCOPY    . KNEE SURGERY Left 2012   L medial meniscal tear  . OOPHORECTOMY Right 1965   Ectopic pregnancy, removal of right ovary and tube- 1960  . ROTATOR CUFF REPAIR Right   . TONSILLECTOMY      OB History    No data available       Home Medications  Prior to Admission medications   Medication Sig Start Date End Date Taking? Authorizing Provider  bisacodyl (DULCOLAX) 10 MG suppository Place 1 suppository (10 mg total) rectally daily as needed for moderate constipation. 05/09/14   Hongalgi, Lenis Dickinson, MD  Calcium Carbonate-Vitamin D (CALCIUM-D PO) Take 1 tablet by mouth daily.    [provider]  ciprofloxacin (CIPRO) 250 MG tablet Take 1 tablet (250 mg total) by mouth 2 (two) times daily. 03/07/17   Ria [facility], MD  clonazePAM (KLONOPIN) 1 MG tablet Take 1 tablet (1  mg total) by mouth at bedtime. 03/05/17 03/05/18  Cloria Spring, MD  dicyclomine (BENTYL) 10 MG capsule Take 1 capsule (10 mg total) by mouth daily as needed for spasms. 05/24/16   Ria Bush, MD  fluticasone [facility]) 50 MCG/ACT nasal spray Place 2 sprays into both nostrils daily as needed for allergies or rhinitis. 05/24/16   Ria Bush, MD  HYDROcodone-acetaminophen (NORCO/VICODIN) 5-325 MG tablet Take 1 tablet by mouth every 6 (six) hours as needed for moderate pain. 05/24/16   Ria Bush, MD  hydrocortisone (ANUSOL-HC) 25 MG suppository Place 1 suppository (25 mg total) rectally 2 (two) times daily as needed for hemorrhoids or itching. 05/09/14   Hongalgi, Lenis Dickinson, MD  lamoTRIgine (LAMICTAL) 25 MG tablet Take 2 tablets (50 mg total) by mouth 2 (two) times daily. 03/05/17   Cloria Spring, MD  levothyroxine (SYNTHROID, LEVOTHROID) 88 MCG tablet 1 tablet before breakfast, 6 days a week 11/19/16   Elayne Snare, MD  montelukast (SINGULAIR) 10 MG tablet TAKE 1 TABLET AT BEDTIME 03/19/16   Ria Bush, MD  ondansetron (ZOFRAN) 4 MG tablet Take 1 tablet (4 mg total) by mouth every 8 (eight) hours as needed for nausea or vomiting. 03/04/17   Ria [facility], MD  PROAIR HFA 108 [phone] Base) MCG/ACT inhaler USE 2 INHALATIONS EVERY 6 HOURS AS NEEDED FOR COUGH 12/19/15   Ria [facility], MD  QUEtiapine (SEROQUEL) 25 MG tablet Take 1 tablet (25 mg total) by mouth 2 (two) times daily. 03/05/17 03/05/18  Cloria Spring, MD  venlafaxine (EFFEXOR) 37.5 MG tablet Take 1 tablet (37.5 mg total) by mouth 2 (two) times daily. 03/05/17   Cloria Spring, MD  Vitamin D, Ergocalciferol, (DRISDOL) 50000 UNITS CAPS capsule TAKE 1 CAPSULE EVERY 7 DAYS 11/14/14   Elayne Snare, MD  ziprasidone (GEODON) 40 MG capsule Take one in the am and two at bedtime 01/02/17   Cloria Spring, MD    Family History Family History  Problem Relation Age of Onset  . Anxiety disorder Grandchild   . Depression Grandchild   .  Cancer Sister        lung  . Healthy Sister   . Diabetes Mother   . Diabetes Father   . Schizophrenia Sister   . Diabetes Sister   . Diabetes Brother   . Cancer Sister        kidney  . Alcohol abuse Neg Hx   . Bipolar disorder Neg Hx   . Dementia Neg Hx   . Drug abuse Neg Hx   . OCD Neg Hx   . Paranoid behavior Neg Hx   . Seizures Neg Hx   . Sexual abuse Neg Hx   . Physical abuse Neg Hx     Social History Social History   Tobacco Use  . Smoking status: Never Smoker  . Smokeless tobacco: Never Used  Substance Use Topics  . Alcohol use: No  . Drug use: No  Allergies   Amoxicillin; Buprenorphine hcl; Morphine and related; Sulfonamide derivatives; and Valium [diazepam]   Review of Systems Review of Systems  All other systems reviewed and are negative.    Physical Exam Updated Vital Signs BP 125/76 (BP Location: Right Arm)   Pulse 94   Temp 98.6 F (37 C) (Oral)   Resp 18   LMP 07/15/2012   SpO2 97%   Physical Exam  Constitutional: She appears well-developed and well-nourished.  HENT:  Head: Normocephalic and atraumatic.  Right Ear: External ear normal.  Left Ear: External ear normal.  Nose: Nose normal.  Mouth/Throat: Uvula is midline, oropharynx is clear and moist and mucous membranes are normal. No tonsillar exudate.  Eyes: Pupils are equal, round, and reactive to light. Right eye exhibits no discharge. Left eye exhibits no discharge. No scleral icterus.  Neck: Trachea normal. Neck supple. No spinous process tenderness present. No neck rigidity. Normal range of motion present.  Cardiovascular: Normal rate, regular rhythm and intact distal pulses.  No murmur heard. Pulses:      Radial pulses are 2+ on the right side, and 2+ on the left side.       Dorsalis pedis pulses are 2+ on the right side, and 2+ on the left side.       Posterior tibial pulses are 2+ on the right side, and 2+ on the left side.  No lower extremity swelling or edema. Calves  symmetric in size bilaterally.  Pulmonary/Chest: Effort normal and breath sounds normal. She exhibits no tenderness.  Abdominal: Soft. Bowel sounds are normal. There is no tenderness. There is no rigidity, no rebound, no guarding and no CVA tenderness.  Genitourinary:  Genitourinary Comments: Chaperone was present.  Patient without pain around the rectal area. Perianal sensory intact. No external fissure's palpated or examined (q-tip test used). Multiple external hemorrhoids noted without evidence of thrombosis, swelling or changes. No induration of the skin or swelling. Digital Rectal Exam reveals sphincter with good tone. No masses palpated. Large amounts of soft brown stool disimpacted. Stool color is brown with no overt blood or melena.  Musculoskeletal: She exhibits no edema.  Lymphadenopathy:    She has no cervical adenopathy.  Neurological: She is alert.  Skin: Skin is warm and dry. No rash noted. She is not diaphoretic.  Psychiatric: She has a normal mood and affect.  Nursing note and vitals reviewed.    ED Treatments / Results  Labs (all labs ordered are listed, but only abnormal results are displayed) Labs Reviewed  COMPREHENSIVE METABOLIC PANEL - Abnormal; Notable for the following components:      Result Value   Sodium 133 (*)    Chloride 98 (*)    Creatinine, Ser 1.21 (*)    ALT 11 (*)    GFR calc non Af Amer 41 (*)    GFR calc Af Amer 47 (*)    All other components within normal limits  CBC WITH DIFFERENTIAL/PLATELET - Abnormal; Notable for the following components:   WBC 12.2 (*)    Neutro Abs 8.7 (*)    All other components within normal limits  URINALYSIS, ROUTINE W REFLEX MICROSCOPIC - Abnormal; Notable for the following components:   Hgb urine dipstick SMALL (*)    Ketones, ur 5 (*)    Squamous Epithelial / LPF 0-5 (*)    All other components within normal limits  I-STAT CG4 LACTIC ACID, ED - Abnormal; Notable for the following components:   Lactic Acid,  Venous 1.94 (*)  All other components within normal limits  LIPASE, BLOOD  I-STAT CG4 LACTIC ACID, ED    EKG  EKG Interpretation  Date/Time:  Monday March 31 2017 15:38:24 EST Ventricular Rate:  97 PR Interval:  186 QRS Duration: 84 QT Interval:  346 QTC Calculation: 439 R Axis:   70 Text Interpretation:  Sinus rhythm with Premature atrial complexes Otherwise normal ECG No significant change since last tracing Confirmed by Blanchie Dessert [phone]) on 03/31/2017 4:12:11 PM       Radiology Ct Abdomen Pelvis W Contrast  Result Date: 03/31/2017 CLINICAL DATA:  Fecal impaction.  Severe rectal pain for 4 days. EXAM: CT ABDOMEN AND PELVIS WITH CONTRAST TECHNIQUE: Multidetector CT imaging of the abdomen and pelvis was performed using the standard protocol following bolus administration of intravenous contrast. CONTRAST:  31mL ISOVUE-300 IOPAMIDOL (ISOVUE-300) INJECTION 61% COMPARISON:  08/14/2005 FINDINGS: Lower Chest: No acute findings. Hepatobiliary: No hepatic masses identified. Prior cholecystectomy. No evidence of biliary obstruction. Pancreas:  No mass or inflammatory changes. Spleen: Within normal limits in size and appearance. Adrenals/Urinary Tract: No masses identified. Tiny sub-cm right renal cyst noted. No evidence of hydronephrosis. Stomach/Bowel: Tiny hiatal hernia. Diverticulosis of sigmoid colon, without evidence of diverticulitis. The rectum is dilated and contains a large amount of stool. There is mild diffuse rectal wall thickening andstranding in the perirectal fat, consistent with stercoral colitis. No other inflammatory process or abnormal fluid collections identified. Vascular/Lymphatic: No pathologically enlarged lymph nodes. No abdominal aortic aneurysm. Reproductive: Prior hysterectomy noted. Adnexal regions are unremarkable in appearance. Other:  None. Musculoskeletal:  No suspicious bone lesions identified. IMPRESSION: Fecal impaction of rectum, with findings of mild  stercoral colitis. Tiny hiatal hernia incidentally noted. Electronically Signed   By: Earle Gell M.D.   On: 03/31/2017 19:05    Procedures Procedures (including critical care time)  Medications Ordered in ED Medications  lidocaine (LIDODERM) 5 % 1 patch (0 patches Transdermal Hold 03/31/17 1919)  sodium chloride 0.9 % bolus 500 mL (0 mLs Intravenous Stopped 03/31/17 2036)  iopamidol (ISOVUE-300) 61 % injection (75 mLs Intravenous Contrast Given 03/31/17 1837)  sodium chloride 0.9 % bolus 500 mL (0 mLs Intravenous Stopped 03/31/17 2036)     Initial Impression / Assessment and Plan / ED Course  I have reviewed the triage vital signs and the nursing notes.  Pertinent labs & imaging results that were available during my care of the patient were reviewed by me and considered in my medical decision making (see chart for details).     This is an 84 year old female followed by Dr. gross of general surgery for external hemorrhoids presenting to the emergency department today for rectal pain as well as constipation over the last 4 days.  Patient notes increasing pain of her hemorrhoids over the last 1 week.  Denies any hematochezia or melena. Exam with multiple external hemorrhoids that do not appear thrombosed and are nontender to palpation.  No evidence of fissure on exam. No further evaluation of this needed at this time. Patient also notes over the last 4 days she has not had any bowel movements and is not passing gas.  Significant abdominal history including abdominal hysterectomy, appendectomy and cholecystectomy.  On exam the patient's abdomen is soft, nontender and nondistended.  Normal bowel sounds in all 4 quadrants.  Patient was manually disimpacted with large amount of soft, brown stool after hard stool disimpacted.  She notes this greatly relieved her symptoms.  Will obtain CT scan as patient has been without a  bowel movement or gas for the last several days with multiple past abdominal  surgeries to rule out obstruction.  Will obtain basic blood work as well.   Patient does not some dysuria. UA without evidence of UTI.   Basic blood work is reassuring.  CT of the abdomen shows fecal impaction of the rectum with mild colitis.  Will attempt a soapsuds enema for further relief.  Mild relief with soapsuds enema nursing staff let me know they feel patient is still impacted.  I repeated digital rectal exam and had significant disimpaction again..  Patient tolerated the procedure well.  She is now able to have soft bowel movements in the department.  Will discharge the patient home on MiraLAX and recommend continuing stool softeners.  Advised drinking enough water and high-fiber foods throughout the day.  Patient is to follow with PCP in regards to today's visit.  Recommended follow-up with [place] surgery on as needed basis for external hemorrhoids.  Recommended sitz bath's for this.  Ensured the patient that there is no evidence of thrombosis at this time or any intervention that are needed acutely. Specific return precautions discussed. Time was given for all questions to be answered. The patient verbalized understanding and agreement with plan. The patient appears safe for discharge home.  Patient case seen and discussed with Dr. Maryan Rued who is in agreement with plan.   Final Clinical Impressions(s) / ED Diagnoses   Final diagnoses:  Constipation, unspecified constipation type  External hemorrhoids    ED Discharge Orders        Ordered    polyethylene glycol (MIRALAX / GLYCOLAX) packet  Daily     03/31/17 2201       Jillyn Ledger, Hershal Coria 03/31/17 2214    Blanchie Dessert, MD 04/01/17 1620

## 2017-04-08 ENCOUNTER — Encounter: Payer: Medicare Other | Admitting: Family Medicine

## 2017-04-08 DIAGNOSIS — Z0289 Encounter for other administrative examinations: Secondary | ICD-10-CM

## 2017-04-14 ENCOUNTER — Other Ambulatory Visit (HOSPITAL_COMMUNITY): Payer: Self-pay | Admitting: Psychiatry

## 2017-04-14 ENCOUNTER — Telehealth (HOSPITAL_COMMUNITY): Payer: Self-pay | Admitting: *Deleted

## 2017-04-14 MED ORDER — CLONAZEPAM 1 MG PO TABS: 1.0000 mg | ORAL_TABLET | Freq: Every day | ORAL | 2 refills | Status: DC

## 2017-04-14 NOTE — Telephone Encounter (Signed)
sent 

## 2017-04-14 NOTE — Telephone Encounter (Signed)
Dr Harrington [facility] has closed & patient has chosen to use [facility]. She is requesting refill on  Clonazepam next appointment is 05/05/17.

## 2017-04-28 ENCOUNTER — Emergency Department (HOSPITAL_COMMUNITY)
Admission: EM | Admit: 2017-04-28 | Discharge: 2017-04-28 | Disposition: A | Discharge status: Home / Self Care | Payer: Medicare Other | Attending: Emergency Medicine | Admitting: Emergency Medicine

## 2017-04-28 ENCOUNTER — Other Ambulatory Visit: Payer: Self-pay

## 2017-04-28 ENCOUNTER — Encounter (HOSPITAL_COMMUNITY): Payer: Self-pay

## 2017-04-28 DIAGNOSIS — J449 Chronic obstructive pulmonary disease, unspecified: Secondary | ICD-10-CM | POA: Diagnosis not present

## 2017-04-28 DIAGNOSIS — R197 Diarrhea, unspecified: Secondary | ICD-10-CM | POA: Insufficient documentation

## 2017-04-28 DIAGNOSIS — Z79899 Other long term (current) drug therapy: Secondary | ICD-10-CM | POA: Diagnosis not present

## 2017-04-28 DIAGNOSIS — R1030 Lower abdominal pain, unspecified: Secondary | ICD-10-CM | POA: Diagnosis not present

## 2017-04-28 DIAGNOSIS — E039 Hypothyroidism, unspecified: Secondary | ICD-10-CM | POA: Diagnosis not present

## 2017-04-28 LAB — CBC
HCT: 40.7 % (ref 36.0–46.0)
HEMOGLOBIN: 13 g/dL (ref 12.0–15.0)
MCH: 28.8 pg (ref 26.0–34.0)
MCHC: 31.9 g/dL (ref 30.0–36.0)
MCV: 90 fL (ref 78.0–100.0)
PLATELETS: 291 10*3/uL (ref 150–400)
RBC: 4.52 MIL/uL (ref 3.87–5.11)
RDW: 15 % (ref 11.5–15.5)
WBC: 9 10*3/uL (ref 4.0–10.5)

## 2017-04-28 LAB — COMPREHENSIVE METABOLIC PANEL
ALT: 10 U/L — ABNORMAL LOW (ref 14–54)
ANION GAP: 11 (ref 5–15)
AST: 20 U/L (ref 15–41)
Albumin: 3 g/dL — ABNORMAL LOW (ref 3.5–5.0)
Alkaline Phosphatase: 84 U/L (ref 38–126)
BUN: 7 mg/dL (ref 6–20)
CHLORIDE: 104 mmol/L (ref 101–111)
CO2: 20 mmol/L — AB (ref 22–32)
Calcium: 9 mg/dL (ref 8.9–10.3)
Creatinine, Ser: 1.13 mg/dL — ABNORMAL HIGH (ref 0.44–1.00)
GFR, EST AFRICAN AMERICAN: 51 mL/min — AB (ref >60)
GFR, EST NON AFRICAN AMERICAN: 44 mL/min — AB (ref >60)
Glucose, Bld: 93 mg/dL (ref 65–99)
POTASSIUM: 3.9 mmol/L (ref 3.5–5.1)
SODIUM: 135 mmol/L (ref 135–145)
Total Bilirubin: 0.6 mg/dL (ref 0.3–1.2)
Total Protein: 6.2 g/dL — ABNORMAL LOW (ref 6.5–8.1)

## 2017-04-28 LAB — LIPASE, BLOOD: LIPASE: 32 U/L (ref 11–51)

## 2017-04-28 MED ORDER — BISMUTH SUBSALICYLATE 262 MG PO CHEW: CHEWABLE_TABLET | ORAL | 0 refills | Status: DC

## 2017-04-28 MED ORDER — LOPERAMIDE HCL 2 MG PO CAPS: 2.0000 mg | ORAL_CAPSULE | Freq: Four times a day (QID) | ORAL | 0 refills | Status: DC | PRN

## 2017-04-28 NOTE — ED Provider Notes (Signed)
[facility] DEPARTMENT Provider Note   CSN: [id number] Arrival date & time: 04/28/17  1321     History   Chief Complaint Chief Complaint  Patient presents with  . Diarrhea    HPI Quanesha Klimaszewski is a 84 y.o. female.  HPI  84 year old female presents with gas and diarrhea.  She states that starting 6 days ago she started having diarrhea.  The diarrhea seemed to stop 3 days ago but now she is passing flatus often and whenever she does a small amount of stool come out.  It is still soft like diarrhea.  There is no blood.  She has felt sore in her lower abdomen but denies specific pain.  No urinary symptoms.  She has not had any fevers, nausea, vomiting.  No recent travel or antibiotics.  She has taken Gas-X with no relief.  Past Medical History:  Diagnosis Date  . Adenomatous polyp    x1  . ALLERGIC RHINITIS 12/20/2006  . Chronic anxiety   . Chronic back pain 04/14/2007   02/2017 [facility] records reviewed - mild lumbar DDD and L SIJ pain s/p ESI latest 10/2016 with good effect  . Diverticulitis   . GERD (gastroesophageal reflux disease)    intermittent, treated with tums  . Hiatal hernia   . Hypothyroidism   . Insomnia   . Osteoporosis 2007   declined prolia  . Perforation of right tympanic membrane 09/03/2015   Chronic, seen by Dr Janace Hoard ENT   . Personal history of kidney stones    x1  . Postmenopausal atrophic vaginitis 2016   rec estrace cream - [place]  . Recurrent UTI   . Scoliosis   . Severe recurrent depression with psychosis ([place])   . Urinary retention   . Vitamin D deficiency     Patient Active Problem List   Diagnosis Date Noted  . Nausea 03/04/2017  . Abdominal cramping 05/24/2016  . Perforation of right tympanic membrane 09/03/2015  . COPD (chronic obstructive pulmonary disease) ([place]) 12/07/2014  . Constipation 05/08/2014  . Urinary retention 05/08/2014  . Urinary urgency 04/05/2014  . Chronic cough 03/02/2013  . Cerumen  impaction 03/02/2013  . Dysphagia 11/05/2012  . Drug-induced Parkinsonism ([place]) 07/29/2012  . Insomnia secondary to depression with anxiety 05/22/2012  . Chronic back pain 04/14/2007  . Hypothyroidism 11/04/2006  . GERD 11/04/2006  . OSTEOPOROSIS 11/04/2006  . Depression, major, recurrent, severe with psychosis ([place]) 08/29/2006    Past Surgical History:  Procedure Laterality Date  . ABDOMINAL HYSTERECTOMY  1979   ectopic pregnancy with one ovary removed  . APPENDECTOMY  1954  . CATARACT EXTRACTION, BILATERAL  11/2015  . CHOLECYSTECTOMY    . ESOPHAGOGASTRODUODENOSCOPY    . KNEE SURGERY Left 2012   L medial meniscal tear  . OOPHORECTOMY Right 1965   Ectopic pregnancy, removal of right ovary and tube- 1960  . ROTATOR CUFF REPAIR Right   . TONSILLECTOMY      OB History    No data available       Home Medications    Prior to Admission medications   Medication Sig Start Date End Date Taking? Authorizing Provider  bisacodyl (DULCOLAX) 10 MG suppository Place 1 suppository (10 mg total) rectally daily as needed for moderate constipation. 05/09/14   Hongalgi, Lenis Dickinson, MD  Calcium Carbonate-Vitamin D (CALCIUM-D PO) Take 1 tablet by mouth daily.    [provider]  clonazePAM (KLONOPIN) 1 MG tablet Take 1 tablet (1 mg total) by  mouth at bedtime. 04/14/17 04/14/18  Cloria Spring, MD  dicyclomine (BENTYL) 10 MG capsule Take 1 capsule (10 mg total) by mouth daily as needed for spasms. 05/24/16   Ria Bush, MD  fluticasone [facility]) 50 MCG/ACT nasal spray Place 2 sprays into both nostrils daily as needed for allergies or rhinitis. 05/24/16   Ria Bush, MD  HYDROcodone-acetaminophen (NORCO/VICODIN) 5-325 MG tablet Take 1 tablet by mouth every 6 (six) hours as needed for moderate pain. 05/24/16   Ria Bush, MD  hydrocortisone (ANUSOL-HC) 25 MG suppository Place 1 suppository (25 mg total) rectally 2 (two) times daily as needed for hemorrhoids or itching. 05/09/14    Hongalgi, Lenis Dickinson, MD  lamoTRIgine (LAMICTAL) 25 MG tablet Take 2 tablets (50 mg total) by mouth 2 (two) times daily. 03/05/17   Cloria Spring, MD  levothyroxine (SYNTHROID, LEVOTHROID) 88 MCG tablet 1 tablet before breakfast, 6 days a week Patient taking differently: Take 88 mcg by mouth daily before breakfast.  11/19/16   Elayne Snare, MD  montelukast (SINGULAIR) 10 MG tablet TAKE 1 TABLET AT BEDTIME 03/19/16   Ria Bush, MD  ondansetron (ZOFRAN) 4 MG tablet Take 1 tablet (4 mg total) by mouth every 8 (eight) hours as needed for nausea or vomiting. 03/04/17   Ria Bush, MD  polyethylene glycol [facility] / Floria Raveling) packet Take 17 g by mouth daily. 03/31/17   Maczis, Barth Kirks, PA-C  PROAIR HFA 108 [phone] Base) MCG/ACT inhaler USE 2 INHALATIONS EVERY 6 HOURS AS NEEDED FOR COUGH 12/19/15   Ria [facility], MD  QUEtiapine (SEROQUEL) 25 MG tablet Take 1 tablet (25 mg total) by mouth 2 (two) times daily. 03/05/17 03/05/18  Cloria Spring, MD  venlafaxine (EFFEXOR) 37.5 MG tablet Take 1 tablet (37.5 mg total) by mouth 2 (two) times daily. 03/05/17   Cloria Spring, MD  Vitamin D, Ergocalciferol, (DRISDOL) 50000 UNITS CAPS capsule TAKE 1 CAPSULE EVERY 7 DAYS 11/14/14   Elayne Snare, MD  ziprasidone (GEODON) 40 MG capsule Take one in the am and two at bedtime Patient taking differently: Take 40-80 mg by mouth See admin instructions. Take one in the am and two at bedtime 01/02/17   Cloria Spring, MD    Family History Family History  Problem Relation Age of Onset  . Anxiety disorder Grandchild   . Depression Grandchild   . Cancer Sister        lung  . Healthy Sister   . Diabetes Mother   . Diabetes Father   . Schizophrenia Sister   . Diabetes Sister   . Diabetes Brother   . Cancer Sister        kidney  . Alcohol abuse Neg Hx   . Bipolar disorder Neg Hx   . Dementia Neg Hx   . Drug abuse Neg Hx   . OCD Neg Hx   . Paranoid behavior Neg Hx   . Seizures Neg Hx   . Sexual abuse Neg  Hx   . Physical abuse Neg Hx     Social History Social History   Tobacco Use  . Smoking status: Never Smoker  . Smokeless tobacco: Never Used  Substance Use Topics  . Alcohol use: No  . Drug use: No     Allergies   Amoxicillin; Buprenorphine hcl; Morphine and related; Sulfonamide derivatives; and Valium [diazepam]   Review of Systems Review of Systems  Constitutional: Negative for fever.  Gastrointestinal: Positive for abdominal pain (soreness, lower) and diarrhea. Negative for blood  in stool, nausea and vomiting.  Genitourinary: Negative for dysuria.  All other systems reviewed and are negative.    Physical Exam Updated Vital Signs BP (!) 144/69 (BP Location: Left Arm)   Pulse 93   Temp 98.5 F (36.9 C) (Oral)   Resp 16   LMP 07/15/2012   SpO2 97%   Physical Exam  Constitutional: She is oriented to person, place, and time. She appears well-developed and well-nourished. No distress.  HENT:  Head: Normocephalic and atraumatic.  Right Ear: External ear normal.  Left Ear: External ear normal.  Nose: Nose normal.  Eyes: Right eye exhibits no discharge. Left eye exhibits no discharge.  Cardiovascular: Normal rate, regular rhythm and normal heart sounds.  Pulmonary/Chest: Effort normal and breath sounds normal.  Abdominal: Soft. She exhibits no distension. There is no tenderness.  Neurological: She is alert and oriented to person, place, and time.  Skin: Skin is warm and dry. She is not diaphoretic.  Nursing note and vitals reviewed.    ED Treatments / Results  Labs (all labs ordered are listed, but only abnormal results are displayed) Labs Reviewed  COMPREHENSIVE METABOLIC PANEL - Abnormal; Notable for the following components:      Result Value   CO2 20 (*)    Creatinine, Ser 1.13 (*)    Total Protein 6.2 (*)    Albumin 3.0 (*)    ALT 10 (*)    GFR calc non Af Amer 44 (*)    GFR calc Af Amer 51 (*)    All other components within normal limits  LIPASE,  BLOOD  CBC  URINALYSIS, ROUTINE W REFLEX MICROSCOPIC    EKG  EKG Interpretation None       Radiology No results found.  Procedures Procedures (including critical care time)  Medications Ordered in ED Medications - No data to display   Initial Impression / Assessment and Plan / ED Course  I have reviewed the triage vital signs and the nursing notes.  Pertinent labs & imaging results that were available during my care of the patient were reviewed by me and considered in my medical decision making (see chart for details).     Patient is overall well-appearing.  Her lab work is unremarkable, especially compared to baseline.  No significant electrolyte abnormality.  The stools have slowed down but now are occurring whenever she passes gas.  I discussed the rectal exam may be helpful but she declines.  She understands this does limit interpretation.  Otherwise, her abdominal exam is benign with no tenderness and so I do not think emergent CT imaging or other imaging would be helpful.  No vomiting to suggest obstructive process.  At this point we recommended symptomatic care such as Imodium and Pepto-Bismol.  If still having symptoms in the next couple days will need to follow-up with PCP.  Otherwise we discussed return precautions.  Final Clinical Impressions(s) / ED Diagnoses   Final diagnoses:  None    ED Discharge Orders    None       Sherwood Gambler, MD 04/28/17 2114

## 2017-04-28 NOTE — ED Notes (Signed)
No reply x3 for vitals 

## 2017-04-28 NOTE — ED Triage Notes (Signed)
Pt reports diarrhea and gas X3 days. Pt states everytime she has gas she has bowel movement in her pants.

## 2017-05-05 ENCOUNTER — Ambulatory Visit (HOSPITAL_COMMUNITY): Payer: Medicare Other | Admitting: Psychiatry

## 2017-05-07 DIAGNOSIS — G894 Chronic pain syndrome: Secondary | ICD-10-CM | POA: Diagnosis not present

## 2017-05-18 ENCOUNTER — Observation Stay
Admission: EM | Admit: 2017-05-18 | Discharge: 2017-05-20 | Disposition: A | Discharge status: Home / Self Care | Payer: Medicare Other | Attending: Internal Medicine | Admitting: Internal Medicine

## 2017-05-18 ENCOUNTER — Other Ambulatory Visit: Payer: Self-pay

## 2017-05-18 ENCOUNTER — Emergency Department: Payer: Medicare Other

## 2017-05-18 ENCOUNTER — Encounter: Payer: Self-pay | Admitting: Emergency Medicine

## 2017-05-18 DIAGNOSIS — K5900 Constipation, unspecified: Secondary | ICD-10-CM

## 2017-05-18 DIAGNOSIS — M419 Scoliosis, unspecified: Secondary | ICD-10-CM | POA: Diagnosis not present

## 2017-05-18 DIAGNOSIS — E440 Moderate protein-calorie malnutrition: Secondary | ICD-10-CM | POA: Diagnosis not present

## 2017-05-18 DIAGNOSIS — K5641 Fecal impaction: Secondary | ICD-10-CM | POA: Diagnosis not present

## 2017-05-18 DIAGNOSIS — G8929 Other chronic pain: Secondary | ICD-10-CM | POA: Insufficient documentation

## 2017-05-18 DIAGNOSIS — Z87440 Personal history of urinary (tract) infections: Secondary | ICD-10-CM | POA: Diagnosis not present

## 2017-05-18 DIAGNOSIS — K219 Gastro-esophageal reflux disease without esophagitis: Secondary | ICD-10-CM | POA: Diagnosis not present

## 2017-05-18 DIAGNOSIS — Z87442 Personal history of urinary calculi: Secondary | ICD-10-CM | POA: Insufficient documentation

## 2017-05-18 DIAGNOSIS — R339 Retention of urine, unspecified: Secondary | ICD-10-CM | POA: Diagnosis not present

## 2017-05-18 DIAGNOSIS — Z880 Allergy status to penicillin: Secondary | ICD-10-CM | POA: Diagnosis not present

## 2017-05-18 DIAGNOSIS — E039 Hypothyroidism, unspecified: Secondary | ICD-10-CM | POA: Diagnosis present

## 2017-05-18 DIAGNOSIS — Z882 Allergy status to sulfonamides status: Secondary | ICD-10-CM | POA: Diagnosis not present

## 2017-05-18 DIAGNOSIS — Z885 Allergy status to narcotic agent status: Secondary | ICD-10-CM | POA: Diagnosis not present

## 2017-05-18 DIAGNOSIS — R109 Unspecified abdominal pain: Secondary | ICD-10-CM | POA: Diagnosis not present

## 2017-05-18 DIAGNOSIS — E559 Vitamin D deficiency, unspecified: Secondary | ICD-10-CM | POA: Insufficient documentation

## 2017-05-18 DIAGNOSIS — J449 Chronic obstructive pulmonary disease, unspecified: Secondary | ICD-10-CM | POA: Insufficient documentation

## 2017-05-18 DIAGNOSIS — R300 Dysuria: Secondary | ICD-10-CM | POA: Diagnosis not present

## 2017-05-18 DIAGNOSIS — R112 Nausea with vomiting, unspecified: Secondary | ICD-10-CM | POA: Diagnosis present

## 2017-05-18 DIAGNOSIS — F329 Major depressive disorder, single episode, unspecified: Secondary | ICD-10-CM | POA: Diagnosis not present

## 2017-05-18 DIAGNOSIS — R39198 Other difficulties with micturition: Secondary | ICD-10-CM | POA: Diagnosis not present

## 2017-05-18 DIAGNOSIS — N760 Acute vaginitis: Secondary | ICD-10-CM | POA: Diagnosis not present

## 2017-05-18 LAB — BASIC METABOLIC PANEL
ANION GAP: 9 (ref 5–15)
BUN: 14 mg/dL (ref 6–20)
CO2: 22 mmol/L (ref 22–32)
Calcium: 8.8 mg/dL — ABNORMAL LOW (ref 8.9–10.3)
Chloride: 102 mmol/L (ref 101–111)
Creatinine, Ser: 0.96 mg/dL (ref 0.44–1.00)
GFR calc Af Amer: >60 mL/min (ref >60)
GFR calc non Af Amer: 54 mL/min — ABNORMAL LOW (ref >60)
Glucose, Bld: 96 mg/dL (ref 65–99)
POTASSIUM: 4.5 mmol/L (ref 3.5–5.1)
SODIUM: 133 mmol/L — AB (ref 135–145)

## 2017-05-18 LAB — URINALYSIS, COMPLETE (UACMP) WITH MICROSCOPIC
Bacteria, UA: NONE SEEN
Bilirubin Urine: NEGATIVE
Glucose, UA: NEGATIVE mg/dL
Hgb urine dipstick: NEGATIVE
KETONES UR: NEGATIVE mg/dL
Nitrite: NEGATIVE
PH: 7 (ref 5.0–8.0)
Protein, ur: NEGATIVE mg/dL
SPECIFIC GRAVITY, URINE: 1.003 — AB (ref 1.005–1.030)

## 2017-05-18 LAB — CBC
HCT: 40.4 % (ref 35.0–47.0)
HEMOGLOBIN: 13.3 g/dL (ref 12.0–16.0)
MCH: 29.5 pg (ref 26.0–34.0)
MCHC: 32.8 g/dL (ref 32.0–36.0)
MCV: 89.8 fL (ref 80.0–100.0)
Platelets: 282 10*3/uL (ref 150–440)
RBC: 4.5 MIL/uL (ref 3.80–5.20)
RDW: 15.8 % — ABNORMAL HIGH (ref 11.5–14.5)
WBC: 6.7 10*3/uL (ref 3.6–11.0)

## 2017-05-18 MED ORDER — ALBUTEROL SULFATE (2.5 MG/3ML) 0.083% IN NEBU: 2.5000 mg | INHALATION_SOLUTION | RESPIRATORY_TRACT | Status: DC | PRN

## 2017-05-18 MED ORDER — MAGNESIUM CITRATE PO SOLN
1.0000 | Freq: Once | ORAL | Status: AC
  Administered 2017-05-18: 1 via ORAL
  Filled 2017-05-18 (×2): qty 296

## 2017-05-18 MED ORDER — FLEET ENEMA 7-19 GM/118ML RE ENEM
1.0000 | ENEMA | Freq: Once | RECTAL | Status: AC
  Administered 2017-05-18: 1 via RECTAL

## 2017-05-18 MED ORDER — PANTOPRAZOLE SODIUM 40 MG IV SOLR
40.0000 mg | Freq: Two times a day (BID) | INTRAVENOUS | Status: DC
  Administered 2017-05-18 – 2017-05-20 (×4): 40 mg via INTRAVENOUS
  Filled 2017-05-18 (×4): qty 40

## 2017-05-18 MED ORDER — LEVOTHYROXINE SODIUM 88 MCG PO TABS
88.0000 ug | ORAL_TABLET | Freq: Every day | ORAL | Status: DC
  Administered 2017-05-19 – 2017-05-20 (×2): 88 ug via ORAL
  Filled 2017-05-18 (×2): qty 1

## 2017-05-18 MED ORDER — ONDANSETRON HCL 4 MG/2ML IJ SOLN
4.0000 mg | Freq: Once | INTRAMUSCULAR | Status: DC
Start: 2017-05-18 — End: 2017-05-20

## 2017-05-18 MED ORDER — ACETAMINOPHEN 325 MG PO TABS: 650.0000 mg | ORAL_TABLET | Freq: Four times a day (QID) | ORAL | Status: DC | PRN

## 2017-05-18 MED ORDER — FLUTICASONE PROPIONATE 50 MCG/ACT NA SUSP
2.0000 | Freq: Every day | NASAL | Status: DC | PRN
  Filled 2017-05-18: qty 16

## 2017-05-18 MED ORDER — BISACODYL 10 MG RE SUPP
10.0000 mg | Freq: Every day | RECTAL | Status: DC | PRN
  Filled 2017-05-18: qty 1

## 2017-05-18 MED ORDER — SODIUM CHLORIDE 0.9 % IV SOLN
INTRAVENOUS | Status: DC
  Administered 2017-05-18: 23:00:00 via INTRAVENOUS

## 2017-05-18 MED ORDER — ACETAMINOPHEN 650 MG RE SUPP: 650.0000 mg | Freq: Four times a day (QID) | RECTAL | Status: DC | PRN

## 2017-05-18 MED ORDER — QUETIAPINE FUMARATE 25 MG PO TABS
25.0000 mg | ORAL_TABLET | Freq: Two times a day (BID) | ORAL | Status: DC
  Administered 2017-05-18 – 2017-05-20 (×4): 25 mg via ORAL
  Filled 2017-05-18 (×4): qty 1

## 2017-05-18 MED ORDER — DOCUSATE SODIUM 100 MG PO CAPS
100.0000 mg | ORAL_CAPSULE | Freq: Two times a day (BID) | ORAL | Status: DC
  Administered 2017-05-18 – 2017-05-19 (×3): 100 mg via ORAL
  Filled 2017-05-18 (×4): qty 1

## 2017-05-18 MED ORDER — ONDANSETRON HCL 4 MG/2ML IJ SOLN
INTRAMUSCULAR | Status: AC
  Administered 2017-05-18: 4 mg via INTRAVENOUS
  Filled 2017-05-18: qty 2

## 2017-05-18 MED ORDER — VENLAFAXINE HCL 37.5 MG PO TABS
37.5000 mg | ORAL_TABLET | Freq: Two times a day (BID) | ORAL | Status: DC
  Administered 2017-05-18 – 2017-05-20 (×4): 37.5 mg via ORAL
  Filled 2017-05-18 (×5): qty 1

## 2017-05-18 MED ORDER — ONDANSETRON HCL 4 MG PO TABS
4.0000 mg | ORAL_TABLET | Freq: Four times a day (QID) | ORAL | Status: DC | PRN
  Filled 2017-05-18: qty 1

## 2017-05-18 MED ORDER — CLONAZEPAM 0.5 MG PO TABS
1.0000 mg | ORAL_TABLET | Freq: Every day | ORAL | Status: DC
  Administered 2017-05-18 – 2017-05-19 (×2): 1 mg via ORAL
  Filled 2017-05-18 (×2): qty 2

## 2017-05-18 MED ORDER — GLYCERIN (LAXATIVE) 2.1 G RE SUPP
1.0000 | RECTAL | Status: AC
  Administered 2017-05-18: 1 via RECTAL
  Filled 2017-05-18: qty 1

## 2017-05-18 MED ORDER — MONTELUKAST SODIUM 10 MG PO TABS
10.0000 mg | ORAL_TABLET | Freq: Every day | ORAL | Status: DC
  Administered 2017-05-18 – 2017-05-19 (×2): 10 mg via ORAL
  Filled 2017-05-18 (×2): qty 1

## 2017-05-18 MED ORDER — HEPARIN SODIUM (PORCINE) 5000 UNIT/ML IJ SOLN
5000.0000 [IU] | Freq: Three times a day (TID) | INTRAMUSCULAR | Status: DC
  Administered 2017-05-18 – 2017-05-20 (×4): 5000 [IU] via SUBCUTANEOUS
  Filled 2017-05-18 (×4): qty 1

## 2017-05-18 MED ORDER — ONDANSETRON HCL 4 MG/2ML IJ SOLN
4.0000 mg | Freq: Four times a day (QID) | INTRAMUSCULAR | Status: DC | PRN
  Administered 2017-05-18 – 2017-05-20 (×2): 4 mg via INTRAVENOUS
  Filled 2017-05-18: qty 2

## 2017-05-18 MED ORDER — PEG 3350-KCL-NA BICARB-NACL 420 G PO SOLR
2000.0000 mL | Freq: Once | ORAL | Status: DC
  Filled 2017-05-18: qty 4000

## 2017-05-18 MED ORDER — FLEET ENEMA 7-19 GM/118ML RE ENEM: 1.0000 | ENEMA | Freq: Once | RECTAL | Status: DC | PRN

## 2017-05-18 MED ORDER — ZIPRASIDONE HCL 40 MG PO CAPS: 40.0000 mg | ORAL_CAPSULE | ORAL | Status: DC

## 2017-05-18 MED ORDER — LAMOTRIGINE 25 MG PO TABS
50.0000 mg | ORAL_TABLET | Freq: Two times a day (BID) | ORAL | Status: DC
  Administered 2017-05-18 – 2017-05-20 (×4): 50 mg via ORAL
  Filled 2017-05-18 (×4): qty 2

## 2017-05-18 NOTE — ED Triage Notes (Signed)
Pt reports has not urinated since 6 am.  No bladder pain or pressure. Last BM yesterday.  Bladder scan 191.  Abdomen soft.  NAD.  No history of similar per pt.  Dysuria.

## 2017-05-18 NOTE — ED Notes (Signed)
Pt continues to sit on bedside commode after enema. Pt reports she feels as though she is having a BM and feels like there is still more to come out. Pt requesting to stay on toilet longer and will press call bell after she feels she is finished having a BM.

## 2017-05-18 NOTE — ED Notes (Signed)
Patient transported to 130

## 2017-05-18 NOTE — ED Notes (Signed)
Bladder scan preformed on pt. 112mL of urine in bladder.

## 2017-05-18 NOTE — ED Notes (Signed)
Admitting MD at bedside.

## 2017-05-18 NOTE — ED Provider Notes (Signed)
With discontinuation of the patient taking GoLYTELY at this time (at least temporarily).  She reports she got nauseated and has vomited at this time.  I have written for 4 mg of IV Zofran.   Delman Kitten, MD 05/18/17 2101

## 2017-05-18 NOTE — ED Provider Notes (Addendum)
[facility] Emergency Department Provider Note   ____________________________________________   First MD Initiated Contact with Patient 05/18/17 1809     (approximate)  I have reviewed the triage vital signs and the nursing notes.   HISTORY  Chief Complaint Urinary Retention    HPI Jamie Pitts is a 84 y.o. female history of diverticulitis, hypothyroidism, and recent fecal impaction  Patient presents today reports that for over a month now she is been feeling very constipated and then having loose and watery stools.  She reports that she was very constipated had to go to the hospital and was blocked up and had to be unblocked in February.  Since that time she reports that she is continued to feel somewhat constipated, but is also having loose and very watery stools.  She reports she has not had a formed bowel movement in about a month.  She also reports that today when she got up she noticed that she was having trouble urinating, she reports she has an urge to go but cannot urinate because it feels like something is blocking the way.  She has not had any nausea or vomiting.  No fevers or chills.  Denies abdominal pain but reports she does just feel very "full".  Past Medical History:  Diagnosis Date  . Adenomatous polyp    x1  . ALLERGIC RHINITIS 12/20/2006  . Chronic anxiety   . Chronic back pain 04/14/2007   02/2017 [facility] records reviewed - mild lumbar DDD and L SIJ pain s/p ESI latest 10/2016 with good effect  . Diverticulitis   . GERD (gastroesophageal reflux disease)    intermittent, treated with tums  . Hiatal hernia   . Hypothyroidism   . Insomnia   . Osteoporosis 2007   declined prolia  . Perforation of right tympanic membrane 09/03/2015   Chronic, seen by Dr Janace Hoard ENT   . Personal history of kidney stones    x1  . Postmenopausal atrophic vaginitis 2016   rec estrace cream - [place]  . Recurrent UTI   . Scoliosis   . Severe recurrent  depression with psychosis ([place])   . Urinary retention   . Vitamin D deficiency     Patient Active Problem List   Diagnosis Date Noted  . Nausea 03/04/2017  . Abdominal cramping 05/24/2016  . Perforation of right tympanic membrane 09/03/2015  . COPD (chronic obstructive pulmonary disease) ([place]) 12/07/2014  . Constipation 05/08/2014  . Urinary retention 05/08/2014  . Urinary urgency 04/05/2014  . Chronic cough 03/02/2013  . Cerumen impaction 03/02/2013  . Dysphagia 11/05/2012  . Drug-induced Parkinsonism ([place]) 07/29/2012  . Insomnia secondary to depression with anxiety 05/22/2012  . Chronic back pain 04/14/2007  . Hypothyroidism 11/04/2006  . GERD 11/04/2006  . OSTEOPOROSIS 11/04/2006  . Depression, major, recurrent, severe with psychosis ([place]) 08/29/2006    Past Surgical History:  Procedure Laterality Date  . ABDOMINAL HYSTERECTOMY  1979   ectopic pregnancy with one ovary removed  . APPENDECTOMY  1954  . CATARACT EXTRACTION, BILATERAL  11/2015  . CHOLECYSTECTOMY    . ESOPHAGOGASTRODUODENOSCOPY    . KNEE SURGERY Left 2012   L medial meniscal tear  . OOPHORECTOMY Right 1965   Ectopic pregnancy, removal of right ovary and tube- 1960  . ROTATOR CUFF REPAIR Right   . TONSILLECTOMY      Prior to Admission medications   Medication Sig Start Date End Date Taking? Authorizing Provider  bisacodyl (DULCOLAX) 10 MG suppository Place 1  suppository (10 mg total) rectally daily as needed for moderate constipation. 05/09/14   Hongalgi, Lenis Dickinson, MD  bismuth subsalicylate (PEPTO-BISMOL) 262 MG chewable tablet 524 mg ORALLY every 0.5 to 1 hour as needed, MAX: 8 doses in 24 hours (4192 mg/day); do not use for more than 2 days 04/28/17   Sherwood Gambler, MD  Calcium Carbonate-Vitamin D (CALCIUM-D PO) Take 1 tablet by mouth daily.    [provider]  clonazePAM (KLONOPIN) 1 MG tablet Take 1 tablet (1 mg total) by mouth at bedtime. 04/14/17 04/14/18  Cloria Spring, MD  dicyclomine  (BENTYL) 10 MG capsule Take 1 capsule (10 mg total) by mouth daily as needed for spasms. 05/24/16   Ria [facility], MD  fluticasone [facility]) 50 MCG/ACT nasal spray Place 2 sprays into both nostrils daily as needed for allergies or rhinitis. 05/24/16   Ria Bush, MD  HYDROcodone-acetaminophen (NORCO/VICODIN) 5-325 MG tablet Take 1 tablet by mouth every 6 (six) hours as needed for moderate pain. 05/24/16   Ria Bush, MD  hydrocortisone (ANUSOL-HC) 25 MG suppository Place 1 suppository (25 mg total) rectally 2 (two) times daily as needed for hemorrhoids or itching. 05/09/14   Hongalgi, Lenis Dickinson, MD  lamoTRIgine (LAMICTAL) 25 MG tablet Take 2 tablets (50 mg total) by mouth 2 (two) times daily. 03/05/17   Cloria Spring, MD  levothyroxine (SYNTHROID, LEVOTHROID) 88 MCG tablet 1 tablet before breakfast, 6 days a week Patient taking differently: Take 88 mcg by mouth daily before breakfast.  11/19/16   Elayne Snare, MD  loperamide (IMODIUM) 2 MG capsule Take 1 capsule (2 mg total) by mouth 4 (four) times daily as needed for diarrhea or loose stools. 04/28/17   Sherwood Gambler, MD  montelukast (SINGULAIR) 10 MG tablet TAKE 1 TABLET AT BEDTIME 03/19/16   Ria [facility], MD  ondansetron (ZOFRAN) 4 MG tablet Take 1 tablet (4 mg total) by mouth every 8 (eight) hours as needed for nausea or vomiting. 03/04/17   Ria Bush, MD  polyethylene glycol [facility] / Floria Raveling) packet Take 17 g by mouth daily. 03/31/17   Maczis, Barth Kirks, PA-C  PROAIR HFA 108 [phone] Base) MCG/ACT inhaler USE 2 INHALATIONS EVERY 6 HOURS AS NEEDED FOR COUGH 12/19/15   Ria Bush, MD  QUEtiapine (SEROQUEL) 25 MG tablet Take 1 tablet (25 mg total) by mouth 2 (two) times daily. 03/05/17 03/05/18  Cloria Spring, MD  venlafaxine (EFFEXOR) 37.5 MG tablet Take 1 tablet (37.5 mg total) by mouth 2 (two) times daily. 03/05/17   Cloria Spring, MD  Vitamin D, Ergocalciferol, (DRISDOL) 50000 UNITS CAPS capsule TAKE 1 CAPSULE EVERY 7  DAYS 11/14/14   Elayne Snare, MD  ziprasidone (GEODON) 40 MG capsule Take one in the am and two at bedtime Patient taking differently: Take 40-80 mg by mouth See admin instructions. Take one in the am and two at bedtime 01/02/17   Cloria Spring, MD    Allergies Amoxicillin; Buprenorphine hcl; Morphine and related; Sulfonamide derivatives; and Valium [diazepam]  Family History  Problem Relation Age of Onset  . Anxiety disorder Grandchild   . Depression Grandchild   . Cancer Sister        lung  . Healthy Sister   . Diabetes Mother   . Diabetes Father   . Schizophrenia Sister   . Diabetes Sister   . Diabetes Brother   . Cancer Sister        kidney  . Alcohol abuse Neg Hx   . Bipolar  disorder Neg Hx   . Dementia Neg Hx   . Drug abuse Neg Hx   . OCD Neg Hx   . Paranoid behavior Neg Hx   . Seizures Neg Hx   . Sexual abuse Neg Hx   . Physical abuse Neg Hx     Social History Social History   Tobacco Use  . Smoking status: Never Smoker  . Smokeless tobacco: Never Used  Substance Use Topics  . Alcohol use: No  . Drug use: No    Review of Systems Constitutional: No fever/chills Eyes: No visual changes. ENT: No sore throat. Cardiovascular: Denies chest pain. Respiratory: Denies shortness of breath. Gastrointestinal: No abdominal pain.  No nausea, no vomiting.  Very loose stools.  Yesterday became incontinent of stool because it is so watery.  She reports if she coughs stool leaks out Genitourinary: Negative for dysuria.  See HPI Musculoskeletal: Negative for back pain. Skin: Negative for rash. Neurological: Negative for headaches, focal weakness or numbness.    ____________________________________________   PHYSICAL EXAM:  VITAL SIGNS: ED Triage Vitals  Enc Vitals Group     BP 05/18/17 1730 (!) 140/97     Pulse Rate 05/18/17 1730 85     Resp 05/18/17 1730 18     Temp 05/18/17 1730 98.3 F (36.8 C)     Temp Source 05/18/17 1730 Oral     SpO2 05/18/17 1730 94  %     Weight 05/18/17 1724 158 lb (71.7 kg)     Height 05/18/17 1724 5\' 5"  (1.651 m)     Head Circumference --      Peak Flow --      Pain Score 05/18/17 1724 0     Pain Loc --      Pain Edu? --      Excl. in [place]? --     Constitutional: Alert and oriented. Well appearing and in no acute distress. Eyes: Conjunctivae are normal. Head: Atraumatic. Nose: No congestion/rhinnorhea. Mouth/Throat: Mucous membranes are moist. Neck: No stridor.   Cardiovascular: Normal rate, regular rhythm. Grossly normal heart sounds.  Good peripheral circulation. Respiratory: Normal respiratory effort.  No retractions. Lungs CTAB. Gastrointestinal: Soft and nontender. No distention. Rectal exam: Performed with nurse Larene Beach.  The patient has brown stool in the rectal vault, but at the very superior portion of the vault I can palpate what appears to be a rock hard ball of stool.  Not able to reach it to disimpact at this time.  Patient denies pain in the rectal region.  She does however report fullness and feel like she cannot pass stool there. Musculoskeletal: No lower extremity tenderness nor edema. Neurologic:  Normal speech and language. No gross focal neurologic deficits are appreciated.  Skin:  Skin is warm, dry and intact. No rash noted. Psychiatric: Mood and affect are normal. Speech and behavior are normal.  ____________________________________________   LABS (all labs ordered are listed, but only abnormal results are displayed)  Labs Reviewed  URINALYSIS, COMPLETE (UACMP) WITH MICROSCOPIC - Abnormal; Notable for the following components:      Result Value   Color, Urine STRAW (*)    APPearance CLEAR (*)    Specific Gravity, Urine 1.003 (*)    Leukocytes, UA TRACE (*)    Squamous Epithelial / LPF 0-5 (*)    All other components within normal limits  CBC - Abnormal; Notable for the following components:   RDW 15.8 (*)    All other components within normal limits  BASIC  METABOLIC PANEL -  Abnormal; Notable for the following components:   Sodium 133 (*)    Calcium 8.8 (*)    GFR calc non Af Amer 54 (*)    All other components within normal limits   ____________________________________________  EKG   ____________________________________________  RADIOLOGY  X-ray reviewed, appears to be a large fecal impaction ____________________________________________   PROCEDURES  Procedure(s) performed: None  Procedures  Critical Care performed: No  ____________________________________________   INITIAL IMPRESSION / ASSESSMENT AND PLAN / ED COURSE  Pertinent labs & imaging results that were available during my care of the patient were reviewed by me and considered in my medical decision making (see chart for details).  Patient returns for evaluation of inability to urinate, also reports very loose watery stool with a feeling of constipation and pressure on the rectum.  She evidently had a fecal impaction recently, and review of her CAT scan demonstrates she had stercoral colitis associated.  At this point, I suspect what is going on as she is experiencing urinary retention likely secondary to a large fecal impaction based on clinical exam and history.  I think the watery stool she is describing is likely stool that is passing by the impaction she is not able to pass any formed stool is a severity of impaction.  The impaction appears to be just superior to where I can reach in the rectal vault.  Discussed with Dr. Dahlia Byes of general surgery, because of the element of suspected stercoral colitis and I am hesitant to perform a soapsuds enema or attempt to pass a enema to pass to the stool in the event she could be at risk for perforation.  She has no signs or symptoms of perforation, but Dr. Adora Fridge advises glycerin suppositories and oral magnesium citrate 3 evaluation for effect thereafter.   ----------------------------------------- 8:45 PM on  05/18/2017 -----------------------------------------  Patient is on a bowel movement after receiving mag citrate and suppository.  Called and discussed case with Dr. Vicente Males, given the extent of the fecal impaction at this point recommends giving the patient a 2000 mL GoLYTELY prep, also recommends giving a fleets enema, and we will admit the patient to have a GI consult in the morning to relieve what appears to be a severe fecal impaction causing what patient denotes at several pounds weight loss and now urinary retention.  Discussed with the patient is in agreement.     ____________________________________________   FINAL CLINICAL IMPRESSION(S) / ED DIAGNOSES  Final diagnoses:  Urinary retention  Fecal impaction ([place])      NEW MEDICATIONS STARTED DURING THIS VISIT:  New Prescriptions   No medications on file     Note:  This document was prepared using Dragon voice recognition software and may include unintentional dictation errors.     Delman Kitten, MD 05/18/17 2046    Delman Kitten, MD 05/18/17 2144

## 2017-05-18 NOTE — H&P (Signed)
History and Physical    [name] UKG:[id number] DOB: [birth year] DOA: 05/18/2017  Referring physician: Dr. Jacqualine Code PCP: Ria Bush, MD  Specialists: none  Chief Complaint: abdominal pain  HPI: Jamie Pitts is a 84 y.o. female has a past medical history significant for depression and hypothyroid ism now with 1 month hx of worsening abdominal pain and constipation. Now unable to urinate. Found to have fecal impaction in ER. She is now admitted. Some N/V. Scant loose stool over the past week. Unsuccessful disimpaction in  ER  Review of Systems: The patient denies anorexia, fever, weight loss,, vision loss, decreased hearing, hoarseness, chest pain, syncope, dyspnea on exertion, peripheral edema, balance deficits, hemoptysis,  melena, hematochezia, severe indigestion/heartburn, hematuria, incontinence, genital sores, muscle weakness, suspicious skin lesions, transient blindness, difficulty walking, depression, unusual weight change, abnormal bleeding, enlarged lymph nodes, angioedema, and breast masses.   Past Medical History:  Diagnosis Date  . Adenomatous polyp    x1  . ALLERGIC RHINITIS 12/20/2006  . Chronic anxiety   . Chronic back pain 04/14/2007   02/2017 [facility] records reviewed - mild lumbar DDD and L SIJ pain s/p ESI latest 10/2016 with good effect  . Diverticulitis   . GERD (gastroesophageal reflux disease)    intermittent, treated with tums  . Hiatal hernia   . Hypothyroidism   . Insomnia   . Osteoporosis 2007   declined prolia  . Perforation of right tympanic membrane 09/03/2015   Chronic, seen by Dr Janace Hoard ENT   . Personal history of kidney stones    x1  . Postmenopausal atrophic vaginitis 2016   rec estrace cream - [place]  . Recurrent UTI   . Scoliosis   . Severe recurrent depression with psychosis ([place])   . Urinary retention   . Vitamin D deficiency    Past Surgical History:  Procedure Laterality Date  . ABDOMINAL HYSTERECTOMY  1979   ectopic  pregnancy with one ovary removed  . APPENDECTOMY  1954  . CATARACT EXTRACTION, BILATERAL  11/2015  . CHOLECYSTECTOMY    . ESOPHAGOGASTRODUODENOSCOPY    . KNEE SURGERY Left 2012   L medial meniscal tear  . OOPHORECTOMY Right 1965   Ectopic pregnancy, removal of right ovary and tube- 1960  . ROTATOR CUFF REPAIR Right   . TONSILLECTOMY     Social History:  reports that she has never smoked. She has never used smokeless tobacco. She reports that she does not drink alcohol or use drugs.  Allergies  Allergen Reactions  . Amoxicillin Hives    Sores in mouth  . Buprenorphine Hcl Itching    Can tolerate if necessary  . Morphine And Related Itching    Can tolerate if necessary  . Sulfonamide Derivatives Hives    Sores in mouth  . Valium [Diazepam] Itching    Can tolerate if necessary    Family History  Problem Relation Age of Onset  . Anxiety disorder Grandchild   . Depression Grandchild   . Cancer Sister        lung  . Healthy Sister   . Diabetes Mother   . Diabetes Father   . Schizophrenia Sister   . Diabetes Sister   . Diabetes Brother   . Cancer Sister        kidney  . Alcohol abuse Neg Hx   . Bipolar disorder Neg Hx   . Dementia Neg Hx   . Drug abuse Neg Hx   . OCD Neg Hx   . Paranoid  behavior Neg Hx   . Seizures Neg Hx   . Sexual abuse Neg Hx   . Physical abuse Neg Hx     Prior to Admission medications   Medication Sig Start Date End Date Taking? Authorizing Provider  bisacodyl (DULCOLAX) 10 MG suppository Place 1 suppository (10 mg total) rectally daily as needed for moderate constipation. 05/09/14   Hongalgi, Lenis Dickinson, MD  bismuth subsalicylate (PEPTO-BISMOL) 262 MG chewable tablet 524 mg ORALLY every 0.5 to 1 hour as needed, MAX: 8 doses in 24 hours (4192 mg/day); do not use for more than 2 days 04/28/17   Sherwood Gambler, MD  Calcium Carbonate-Vitamin D (CALCIUM-D PO) Take 1 tablet by mouth daily.    [provider]  clonazePAM (KLONOPIN) 1 MG tablet  Take 1 tablet (1 mg total) by mouth at bedtime. 04/14/17 04/14/18  Cloria Spring, MD  dicyclomine (BENTYL) 10 MG capsule Take 1 capsule (10 mg total) by mouth daily as needed for spasms. 05/24/16   Ria Bush, MD  fluticasone [facility]) 50 MCG/ACT nasal spray Place 2 sprays into both nostrils daily as needed for allergies or rhinitis. 05/24/16   Ria Bush, MD  HYDROcodone-acetaminophen (NORCO/VICODIN) 5-325 MG tablet Take 1 tablet by mouth every 6 (six) hours as needed for moderate pain. 05/24/16   Ria Bush, MD  hydrocortisone (ANUSOL-HC) 25 MG suppository Place 1 suppository (25 mg total) rectally 2 (two) times daily as needed for hemorrhoids or itching. 05/09/14   Hongalgi, Lenis Dickinson, MD  lamoTRIgine (LAMICTAL) 25 MG tablet Take 2 tablets (50 mg total) by mouth 2 (two) times daily. 03/05/17   Cloria Spring, MD  levothyroxine (SYNTHROID, LEVOTHROID) 88 MCG tablet 1 tablet before breakfast, 6 days a week Patient taking differently: Take 88 mcg by mouth daily before breakfast.  11/19/16   Elayne Snare, MD  loperamide (IMODIUM) 2 MG capsule Take 1 capsule (2 mg total) by mouth 4 (four) times daily as needed for diarrhea or loose stools. 04/28/17   Sherwood Gambler, MD  montelukast (SINGULAIR) 10 MG tablet TAKE 1 TABLET AT BEDTIME 03/19/16   Ria Bush, MD  ondansetron (ZOFRAN) 4 MG tablet Take 1 tablet (4 mg total) by mouth every 8 (eight) hours as needed for nausea or vomiting. 03/04/17   Ria Bush, MD  polyethylene glycol [facility] Raveling) packet Take 17 g by mouth daily. 03/31/17   Maczis, Barth Kirks, PA-C  PROAIR HFA 108 [phone] Base) MCG/ACT inhaler USE 2 INHALATIONS EVERY 6 HOURS AS NEEDED FOR COUGH 12/19/15   Ria Bush, MD  QUEtiapine (SEROQUEL) 25 MG tablet Take 1 tablet (25 mg total) by mouth 2 (two) times daily. 03/05/17 03/05/18  Cloria Spring, MD  venlafaxine (EFFEXOR) 37.5 MG tablet Take 1 tablet (37.5 mg total) by mouth 2 (two) times daily. 03/05/17   Cloria Spring, MD  Vitamin D, Ergocalciferol, (DRISDOL) 50000 UNITS CAPS capsule TAKE 1 CAPSULE EVERY 7 DAYS 11/14/14   Elayne Snare, MD  ziprasidone (GEODON) 40 MG capsule Take one in the am and two at bedtime Patient taking differently: Take 40-80 mg by mouth See admin instructions. Take one in the am and two at bedtime 01/02/17   Cloria Spring, MD   Physical Exam: Vitals:   05/18/17 1724 05/18/17 1730 05/18/17 2032  BP:  (!) 140/97 133/71  Pulse:  85 88  Resp:  18 16  Temp:  98.3 F (36.8 C)   TempSrc:  Oral   SpO2:  94% 96%  Weight:  71.7 kg (158 lb)    Height: 5\' 5"  (1.651 m)       General:  No apparent distress, [place]/AT, WDWN  Eyes: PERRL, EOMI, no scleral icterus, conjunctiva clear  ENT: moist oropharynx without exudate, TM's benign, dentition fair  Neck: supple, no lymphadenopathy. No bruits or thyromegaly  Cardiovascular: regular rate without MRG; 2+ peripheral pulses, no JVD, no peripheral edema  Respiratory: CTA biL, good air movement without wheezing, rhonchi or crackled. Respiratory effort normal  Abdomen: distrended,  tender to palpation, hypoactive bowel sounds, no guarding, no rebound  Skin: no rashes or lesions  Musculoskeletal: normal bulk and tone, no joint swelling  Psychiatric: normal mood and affect, A&OX 3  Neurologic: CN 2-12 grossly intact, Motor strength 5/5 in all 4 groups with symmetric DTR's and non-focal sensory exam  Labs on Admission:  Basic Metabolic Panel: Recent Labs  Lab 05/18/17 1734  NA 133*  K 4.5  CL 102  CO2 22  GLUCOSE 96  BUN 14  CREATININE 0.96  CALCIUM 8.8*   Liver Function Tests: No results for input(s): AST, ALT, ALKPHOS, BILITOT, PROT, ALBUMIN in the last 168 hours. No results for input(s): LIPASE, AMYLASE in the last 168 hours. No results for input(s): AMMONIA in the last 168 hours. CBC: Recent Labs  Lab 05/18/17 1734  WBC 6.7  HGB 13.3  HCT 40.4  MCV 89.8  PLT 282   Cardiac Enzymes: No results for  input(s): CKTOTAL, CKMB, CKMBINDEX, TROPONINI in the last 168 hours.  BNP (last 3 results) No results for input(s): BNP in the last 8760 hours.  ProBNP (last 3 results) No results for input(s): PROBNP in the last 8760 hours.  CBG: No results for input(s): GLUCAP in the last 168 hours.  Radiological Exams on Admission: Dg Abd 2 Views  Result Date: 05/18/2017 CLINICAL DATA:  Difficulty urinating. EXAM: ABDOMEN - 2 VIEW COMPARISON:  CT abdomen pelvis 03/31/2017. FINDINGS: No free intraperitoneal air. There is S shaped thoracolumbar scoliosis. No dilated small bowel. There is a large stool ball in the rectum. Moderate stool volume throughout the rest of the colon. IMPRESSION: Large stool ball in the colon, likely fecal impaction. Electronically Signed   By: Ulyses Jarred M.D.   On: 05/18/2017 19:54    EKG: Independently reviewed.  Assessment/Plan Principal Problem:   Fecal impaction (HCC) Active Problems:   Hypothyroidism   Urinary retention   Abdominal pain   Will observe on floor with GoLytely and Fleets enema and IV fluids. Foley placed. Consult GI  Diet: NPO Fluids: NS@75  DVT Prophylaxis: SQ Heparin  Code Status: FULL  Family Communication: none  Disposition Plan: home  Time spent: 50 min

## 2017-05-18 NOTE — ED Notes (Signed)
Pt unable to urinate at this time. Pt states "can't you just take it." Pt advised that we do not do that in triage that that is something that would have to do once in a rm. Pt back out in lobby.

## 2017-05-19 ENCOUNTER — Observation Stay: Payer: Medicare Other

## 2017-05-19 DIAGNOSIS — F329 Major depressive disorder, single episode, unspecified: Secondary | ICD-10-CM | POA: Diagnosis not present

## 2017-05-19 DIAGNOSIS — K5900 Constipation, unspecified: Secondary | ICD-10-CM | POA: Diagnosis not present

## 2017-05-19 DIAGNOSIS — K5641 Fecal impaction: Secondary | ICD-10-CM | POA: Diagnosis not present

## 2017-05-19 DIAGNOSIS — E46 Unspecified protein-calorie malnutrition: Secondary | ICD-10-CM | POA: Diagnosis not present

## 2017-05-19 DIAGNOSIS — E039 Hypothyroidism, unspecified: Secondary | ICD-10-CM | POA: Diagnosis not present

## 2017-05-19 LAB — COMPREHENSIVE METABOLIC PANEL
ALT: 9 U/L — AB (ref 14–54)
ANION GAP: 10 (ref 5–15)
AST: 16 U/L (ref 15–41)
Albumin: 2.9 g/dL — ABNORMAL LOW (ref 3.5–5.0)
Alkaline Phosphatase: 72 U/L (ref 38–126)
BUN: 10 mg/dL (ref 6–20)
CHLORIDE: 106 mmol/L (ref 101–111)
CO2: 26 mmol/L (ref 22–32)
CREATININE: 0.82 mg/dL (ref 0.44–1.00)
Calcium: 8.7 mg/dL — ABNORMAL LOW (ref 8.9–10.3)
GFR calc Af Amer: >60 mL/min (ref >60)
GFR calc non Af Amer: >60 mL/min (ref >60)
Glucose, Bld: 102 mg/dL — ABNORMAL HIGH (ref 65–99)
Potassium: 3.9 mmol/L (ref 3.5–5.1)
SODIUM: 142 mmol/L (ref 135–145)
Total Bilirubin: 0.7 mg/dL (ref 0.3–1.2)
Total Protein: 6 g/dL — ABNORMAL LOW (ref 6.5–8.1)

## 2017-05-19 LAB — CBC
HCT: 38.2 % (ref 35.0–47.0)
Hemoglobin: 12.5 g/dL (ref 12.0–16.0)
MCH: 29.2 pg (ref 26.0–34.0)
MCHC: 32.7 g/dL (ref 32.0–36.0)
MCV: 89.4 fL (ref 80.0–100.0)
PLATELETS: 243 10*3/uL (ref 150–440)
RBC: 4.27 MIL/uL (ref 3.80–5.20)
RDW: 15.5 % — ABNORMAL HIGH (ref 11.5–14.5)
WBC: 6.4 10*3/uL (ref 3.6–11.0)

## 2017-05-19 LAB — TSH: TSH: 0.147 u[IU]/mL — ABNORMAL LOW (ref 0.350–4.500)

## 2017-05-19 MED ORDER — LACTULOSE ENEMA
300.0000 mL | Freq: Once | ORAL | Status: AC
  Administered 2017-05-19: 300 mL via RECTAL
  Filled 2017-05-19: qty 300

## 2017-05-19 MED ORDER — DOCUSATE SODIUM 100 MG PO CAPS
100.0000 mg | ORAL_CAPSULE | Freq: Two times a day (BID) | ORAL | Status: DC
  Administered 2017-05-19: 22:00:00 100 mg via ORAL
  Filled 2017-05-19: qty 1

## 2017-05-19 MED ORDER — POLYETHYLENE GLYCOL 3350 17 G PO PACK
17.0000 g | PACK | Freq: Every day | ORAL | Status: DC
  Administered 2017-05-19: 17 g via ORAL
  Filled 2017-05-19: qty 1

## 2017-05-19 MED ORDER — ENSURE ENLIVE PO LIQD
237.0000 mL | Freq: Two times a day (BID) | ORAL | Status: DC
  Administered 2017-05-19: 14:00:00 237 mL via ORAL

## 2017-05-19 MED ORDER — HYDROCODONE-ACETAMINOPHEN 5-325 MG PO TABS
1.0000 | ORAL_TABLET | Freq: Four times a day (QID) | ORAL | Status: DC | PRN
  Administered 2017-05-19 (×3): 1 via ORAL
  Filled 2017-05-19 (×3): qty 1

## 2017-05-19 MED ORDER — ADULT MULTIVITAMIN W/MINERALS CH
1.0000 | ORAL_TABLET | Freq: Every day | ORAL | Status: DC
  Administered 2017-05-19 – 2017-05-20 (×2): 1 via ORAL
  Filled 2017-05-19 (×2): qty 1

## 2017-05-19 MED ORDER — SENNA 8.6 MG PO TABS
1.0000 | ORAL_TABLET | Freq: Every day | ORAL | Status: DC
  Administered 2017-05-19: 11:00:00 8.6 mg via ORAL
  Filled 2017-05-19: qty 1

## 2017-05-19 MED ORDER — MAGNESIUM CITRATE PO SOLN
1.0000 | Freq: Once | ORAL | Status: AC
  Administered 2017-05-19: 16:00:00 1 via ORAL
  Filled 2017-05-19: qty 296

## 2017-05-19 MED ORDER — METHYLNALTREXONE BROMIDE 12 MG/0.6ML ~~LOC~~ SOLN
12.0000 mg | Freq: Once | SUBCUTANEOUS | Status: AC
  Administered 2017-05-19: 12 mg via SUBCUTANEOUS
  Filled 2017-05-19: qty 0.6

## 2017-05-19 NOTE — Progress Notes (Signed)
Initial Nutrition Assessment  DOCUMENTATION CODES:   Non-severe (moderate) malnutrition in context of acute illness/injury  INTERVENTION:   - Ensure Enlive po BID, each supplement provides 350 kcal and 20 grams of protein  - MVI with minerals daily  - Encourage PO intake  NUTRITION DIAGNOSIS:   Moderate Malnutrition related to acute illness, poor appetite(several recent episodes of fecal impaction) as evidenced by mild fat depletion, moderate muscle depletion.  GOAL:   Patient will meet greater than or equal to 90% of their needs  MONITOR:   PO intake, Supplement acceptance, Weight trends, Skin  REASON FOR ASSESSMENT:   Malnutrition Screening Tool    ASSESSMENT:   84 year old female with PMH significant for diverticulitis, hypothyroidism, GERD, and recent fecal impaction. Pt presented to ED with report that she has been very constipated for over a month with some loose and watery stools and difficulty urinating. Pt found to have fecal impaction in the ED.  Spoke with pt and family members at bedside. Pt states her appetite has been "so-so" for the past 3 months. Pt is unsure what "triggered" her poor appetite. Pt also endorses low energy levels during this time.  Pt typically eats 2 meals daily. Pt's husband makes her breakfast each day which includes a protein shake made with [employer], milk, and a banana. Pt and her husband usually eat out for dinner.  Pt reports her UBW as 170 lbs and that she last weighed this in December 2018. Pt states this was her UBW for "the past 5 years." Per weight history in chart, pt has lost 26 lbs since October 2018. This is a 13.6% weight loss in 6 months which is significant for timeframe.  Pt agreeable to trying Ensure Enlive oral nutrition supplement. Pt prefers chocolate flavor. RD to order.  Medications reviewed and include: 100 mg Colace BID, 88 mcg levothyroxine daily, 40 mg Protonix BID, 2000 mL GoLYTELY  once  Labs reviewed.  NUTRITION - FOCUSED PHYSICAL EXAM:    Most Recent Value  Orbital Region  Mild depletion  Upper Arm Region  No depletion  Thoracic and Lumbar Region  No depletion  Buccal Region  Mild depletion  Temple Region  No depletion  Clavicle Bone Region  Mild depletion  Clavicle and Acromion Bone Region  Mild depletion  Scapular Bone Region  Unable to assess  Dorsal Hand  No depletion  Patellar Region  Mild depletion  Anterior Thigh Region  Moderate depletion  Posterior Calf Region  Moderate depletion  Edema (RD Assessment)  None  Hair  Reviewed  Eyes  Reviewed  Mouth  Reviewed  Skin  Reviewed  Nails  Reviewed       Diet Order:  Diet regular Room service appropriate? Yes; Fluid consistency: Thin  EDUCATION NEEDS:   No education needs have been identified at this time  Skin:  Skin Assessment: Reviewed RN Assessment(moisture associated skin damage to groin and breast)  Last BM:  05/19/17 medium type 6  Height:   Ht Readings from Last 1 Encounters:  05/18/17 5\' 5"  (1.651 m)    Weight:   Wt Readings from Last 1 Encounters:  05/19/17 163 lb 12.8 oz (74.3 kg)    Ideal Body Weight:  56.8 kg  BMI:  Body mass index is 27.26 kg/m.  Estimated Nutritional Needs:   Kcal:  1600-1800 kcal/day (MSJ x 1.3-1.5)  Protein:  90-105 grams/day (1.2-1.4 grams/kg)  Fluid:  >/= 1.8 L/day    Gaynell Face, MS, RD, LDN Pager:  [phone] Weekend/After Hours: [phone]

## 2017-05-19 NOTE — Progress Notes (Signed)
PT Cancellation Note  Patient Details Name: Jamie Pitts MRN: [id number] DOB: 1933-10-29   Cancelled Treatment:    Reason Eval/Treat Not Completed: Other (comment).  PT consult received.  Chart reviewed.  Pt admitted with constipation.  Discussed pt with nursing who recommended holding PT today (d/t pt with diarrhea and also received enema).  Will re-attempt PT evaluation at a later date/time.  Leitha Bleak, PT 05/19/17, 3:02 PM [phone]

## 2017-05-19 NOTE — Plan of Care (Signed)
Patient continues with frequent loose stools.

## 2017-05-19 NOTE — Progress Notes (Signed)
Chaplain responded to a AD for POA. Family said they had all they needed. Chaplain prayed with family.    05/19/17 1300  Clinical Encounter Type  Visited With Patient and family together  Visit Type Initial  Referral From Nurse  Spiritual Encounters  Spiritual Needs Brochure;Prayer

## 2017-05-19 NOTE — Care Management Obs Status (Signed)
[place] NOTIFICATION   Patient Details  Name: Jamie Pitts MRN: [id number] Date of Birth: 10-16-1933   Medicare Observation Status Notification Given:  Yes Daughter given permission per Ms. [place], RN 05/19/2017, 12:22 PM

## 2017-05-19 NOTE — Consult Note (Signed)
Jamie Lame, MD [facility]  [address] [place], [place] [address] Phone: [phone] Fax : [phone]  Consultation  Referring Provider:     Dr. Benjie Karvonen Primary Care Physician:  Ria Bush, MD Primary Gastroenterologist: Althia Forts         Reason for Consultation:     Stool impaction  Date of Admission:  05/18/2017 Date of Consultation:  05/19/2017         HPI:   Jamie Pitts is a 84 y.o. female who was admitted with a history of chronic constipation.  The patient states that she usually takes laxatives and this takes care of it.  The patient reports that she has lost 20-30 pounds because of her stool burden.  She was getting enemas yesterday and today and states that she is having better bowel movements.  She denies any abdominal distention or abdominal pain.  The patient's KUB this morning showed an increased stool burden and not a decreased stool burden.  There is no report of any nausea vomiting or fevers or chills.  The patient was admitted for abdominal pain that she states was worsening.  She reports that her main concern when she came in was that she could not urinate and she had a catheter placed.  There was some fecal disimpaction tried in the ER without success.  Past Medical History:  Diagnosis Date  . Adenomatous polyp    x1  . ALLERGIC RHINITIS 12/20/2006  . Chronic anxiety   . Chronic back pain 04/14/2007   02/2017 [facility] records reviewed - mild lumbar DDD and L SIJ pain s/p ESI latest 10/2016 with good effect  . Diverticulitis   . GERD (gastroesophageal reflux disease)    intermittent, treated with tums  . Hiatal hernia   . Hypothyroidism   . Insomnia   . Osteoporosis 2007   declined prolia  . Perforation of right tympanic membrane 09/03/2015   Chronic, seen by Dr Janace Hoard ENT   . Personal history of kidney stones    x1  . Postmenopausal atrophic vaginitis 2016   rec estrace cream - [place]  . Recurrent UTI   . Scoliosis   . Severe recurrent  depression with psychosis ([place])   . Urinary retention   . Vitamin D deficiency     Past Surgical History:  Procedure Laterality Date  . ABDOMINAL HYSTERECTOMY  1979   ectopic pregnancy with one ovary removed  . APPENDECTOMY  1954  . CATARACT EXTRACTION, BILATERAL  11/2015  . CHOLECYSTECTOMY    . ESOPHAGOGASTRODUODENOSCOPY    . KNEE SURGERY Left 2012   L medial meniscal tear  . OOPHORECTOMY Right 1965   Ectopic pregnancy, removal of right ovary and tube- 1960  . ROTATOR CUFF REPAIR Right   . TONSILLECTOMY      Prior to Admission medications   Medication Sig Start Date End Date Taking? Authorizing Provider  bisacodyl (DULCOLAX) 10 MG suppository Place 1 suppository (10 mg total) rectally daily as needed for moderate constipation. 05/09/14  Yes Hongalgi, Lenis Dickinson, MD  bismuth subsalicylate (PEPTO-BISMOL) 262 MG chewable tablet 524 mg ORALLY every 0.5 to 1 hour as needed, MAX: 8 doses in 24 hours (4192 mg/day); do not use for more than 2 days 04/28/17  Yes Sherwood Gambler, MD  Calcium Carbonate-Vitamin D (CALCIUM-D PO) Take 1 tablet by mouth daily.   Yes [provider]  clonazePAM (KLONOPIN) 1 MG tablet Take 1 tablet (1 mg total) by mouth at bedtime. 04/14/17 04/14/18 Yes Harrington Challenger,  Su Ley, MD  dicyclomine (BENTYL) 10 MG capsule Take 1 capsule (10 mg total) by mouth daily as needed for spasms. 05/24/16  Yes Ria Bush, MD  lamoTRIgine (LAMICTAL) 25 MG tablet Take 2 tablets (50 mg total) by mouth 2 (two) times daily. 03/05/17  Yes Cloria Spring, MD  levothyroxine (SYNTHROID, LEVOTHROID) 88 MCG tablet 1 tablet before breakfast, 6 days a week Patient taking differently: Take 88 mcg by mouth daily before breakfast.  11/19/16  Yes Elayne Snare, MD  loperamide (IMODIUM) 2 MG capsule Take 1 capsule (2 mg total) by mouth 4 (four) times daily as needed for diarrhea or loose stools. 04/28/17  Yes Sherwood Gambler, MD  ondansetron (ZOFRAN) 4 MG tablet Take 1 tablet (4 mg total) by mouth every 8  (eight) hours as needed for nausea or vomiting. 03/04/17  Yes Ria Bush, MD  PROAIR HFA 108 [phone] Base) MCG/ACT inhaler USE 2 INHALATIONS EVERY 6 HOURS AS NEEDED FOR COUGH 12/19/15  Yes Ria Bush, MD  venlafaxine [facility]) 37.5 MG tablet Take 1 tablet (37.5 mg total) by mouth 2 (two) times daily. 03/05/17  Yes Cloria Spring, MD  fluticasone [facility]) 50 MCG/ACT nasal spray Place 2 sprays into both nostrils daily as needed for allergies or rhinitis. Patient not taking: Reported on 05/18/2017 05/24/16   Ria Bush, MD  HYDROcodone-acetaminophen (NORCO/VICODIN) 5-325 MG tablet Take 1 tablet by mouth every 6 (six) hours as needed for moderate pain. 05/24/16   Ria Bush, MD  hydrocortisone (ANUSOL-HC) 25 MG suppository Place 1 suppository (25 mg total) rectally 2 (two) times daily as needed for hemorrhoids or itching. Patient not taking: Reported on 05/18/2017 05/09/14   Modena Jansky, MD  montelukast (SINGULAIR) 10 MG tablet TAKE 1 TABLET AT BEDTIME Patient not taking: Reported on 05/18/2017 03/19/16   Ria Bush, MD  polyethylene glycol [facility] Floria Raveling) packet Take 17 g by mouth daily. Patient not taking: Reported on 05/18/2017 03/31/17   Jillyn Ledger, PA-C  QUEtiapine (SEROQUEL) 25 MG tablet Take 1 tablet (25 mg total) by mouth 2 (two) times daily. Patient not taking: Reported on 05/18/2017 03/05/17 03/05/18  Cloria Spring, MD  Vitamin D, Ergocalciferol, (DRISDOL) 50000 UNITS CAPS capsule TAKE 1 CAPSULE EVERY 7 DAYS Patient not taking: Reported on 05/18/2017 11/14/14   Elayne Snare, MD  ziprasidone (GEODON) 40 MG capsule Take one in the am and two at bedtime Patient not taking: Reported on 05/18/2017 01/02/17   Cloria Spring, MD    Family History  Problem Relation Age of Onset  . Anxiety disorder Grandchild   . Depression Grandchild   . Cancer Sister        lung  . Healthy Sister   . Diabetes Mother   . Diabetes Father   . Schizophrenia Sister   .  Diabetes Sister   . Diabetes Brother   . Cancer Sister        kidney  . Alcohol abuse Neg Hx   . Bipolar disorder Neg Hx   . Dementia Neg Hx   . Drug abuse Neg Hx   . OCD Neg Hx   . Paranoid behavior Neg Hx   . Seizures Neg Hx   . Sexual abuse Neg Hx   . Physical abuse Neg Hx      Social History   Tobacco Use  . Smoking status: Never Smoker  . Smokeless tobacco: Never Used  Substance Use Topics  . Alcohol use: No  . Drug use: No  Allergies as of 05/18/2017 - Review Complete 05/18/2017  Allergen Reaction Noted  . Amoxicillin Hives 11/05/2012  . Buprenorphine hcl Itching 06/22/2015  . Morphine and related Itching 02/23/2013  . Sulfonamide derivatives Hives 11/03/2006  . Valium [diazepam] Itching 02/23/2013    Review of Systems:    All systems reviewed and negative except where noted in HPI.   Physical Exam:  Vital signs in last 24 hours: Temp:  [97.7 F (36.5 C)-98.3 F (36.8 C)] 97.7 F (36.5 C) (04/01 0442) Pulse Rate:  [84-89] 87 (04/01 0442) Resp:  [16-18] 16 (04/01 0442) BP: (121-140)/(66-97) 121/66 (04/01 0442) SpO2:  [94 %-98 %] 97 % (04/01 0442) Weight:  [158 lb (71.7 kg)-163 lb 12.8 oz (74.3 kg)] 163 lb 12.8 oz (74.3 kg) (04/01 0436) Last BM Date: 05/18/17 General:   Pleasant, cooperative in NAD Head:  Normocephalic and atraumatic. Eyes:   No icterus.   Conjunctiva pink. PERRLA. Ears:  Normal auditory acuity. Neck:  Supple; no masses or thyroidomegaly Lungs: Respirations even and unlabored. Lungs clear to auscultation bilaterally.   No wheezes, crackles, or rhonchi.  Heart:  Regular rate and rhythm;  Without murmur, clicks, rubs or gallops Abdomen:  Soft, nondistended, nontender. Normal bowel sounds. No appreciable masses or hepatomegaly.  No rebound or guarding.  Positive tympany. Rectal:  Not performed. Msk:  Symmetrical without gross deformities.    Extremities:  Without edema, cyanosis or clubbing. Neurologic:  Alert and oriented x3;  grossly  normal neurologically. Skin:  Intact without significant lesions or rashes. Cervical Nodes:  No significant cervical adenopathy. Psych:  Alert and cooperative. Normal affect.  LAB RESULTS: Recent Labs    05/18/17 1734 05/19/17 0326  WBC 6.7 6.4  HGB 13.3 12.5  HCT 40.4 38.2  PLT 282 243   BMET Recent Labs    05/18/17 1734 05/19/17 0326  NA 133* 142  K 4.5 3.9  CL 102 106  CO2 22 26  GLUCOSE 96 102*  BUN 14 10  CREATININE 0.96 0.82  CALCIUM 8.8* 8.7*   LFT Recent Labs    05/19/17 0326  PROT 6.0*  ALBUMIN 2.9*  AST 16  ALT 9*  ALKPHOS 72  BILITOT 0.7   PT/INR No results for input(s): LABPROT, INR in the last 72 hours.  STUDIES: Dg Abd 1 View  Result Date: 05/19/2017 CLINICAL DATA:  Persistent constipation alternating with loose and watery stools EXAM: ABDOMEN - 1 VIEW COMPARISON:  Abdominal radiographs of May 18, 2017 FINDINGS: There is increased stool burden within the rectum likely reflecting an impaction. More proximally there is no excessive stool burden. There is small amounts of gas within normal caliber small and large bowel loops. There is no free extraluminal gas. The bony structures exhibit no acute abnormalities. There is levocurvature of the lower thoracic and lumbar spine. IMPRESSION: Increased stool burden in the pelvis likely reflecting a fecal impaction. Electronically Signed   By: David  Martinique M.D.   On: 05/19/2017 10:58   Dg Abd 2 Views  Result Date: 05/18/2017 CLINICAL DATA:  Difficulty urinating. EXAM: ABDOMEN - 2 VIEW COMPARISON:  CT abdomen pelvis 03/31/2017. FINDINGS: No free intraperitoneal air. There is S shaped thoracolumbar scoliosis. No dilated small bowel. There is a large stool ball in the rectum. Moderate stool volume throughout the rest of the colon. IMPRESSION: Large stool ball in the colon, likely fecal impaction. Electronically Signed   By: Ulyses Jarred M.D.   On: 05/18/2017 19:54      Impression / Plan:  Jamie Pitts is  a 83 y.o. y/o female with a history of chronic constipation.  The patient states she has not had a colonoscopy in 10 years.  The patient is being treated with enemas at the present time.  She states that she has had multiple bowel movements yesterday and today and states she is feeling back to her usual self.  The patient has not had any black stools or bloody stools.  She also reports that she has had significant weight loss since she has been moving her bowels.  The patient will be given a bottle of mag citrate for today and will also have a repeat KUB for tomorrow.  The patient will also need a colonoscopy after discharge and I have recommended that she be put on something every day to keep her bowels moving so she does not get impacted again in the future.  The patient has been explained the plan and agrees with it.  Thank you for involving me in the care of this patient.      LOS: 0 days   Jamie Lame, MD  05/19/2017, 3:48 PM   Note: This dictation was prepared with Dragon dictation along with smaller phrase technology. Any transcriptional errors that result from this process are unintentional.

## 2017-05-19 NOTE — Progress Notes (Signed)
Family Meeting Note  Advance Directive:no  Today a meeting took place with the family.   The following clinical team members were present during this meeting:MD  The following were discussed:Patient's diagnosis: constipation Scoliosis  , Patient's progosis: > 12 months and Goals for treatment: Full Code  Additional follow-up to be provided: chaplain consult to start advanced directives  Time spent during discussion:16 minutes  Jamie Betke, MD

## 2017-05-19 NOTE — Progress Notes (Signed)
[place] [facility] [place] NAME: Jamie Pitts    MR#:  [id number]  DATE OF BIRTH:  [birth year]  SUBJECTIVE:  Patient having some loose stools.  Abdominal pain seems to be better controlled this morning.   REVIEW OF SYSTEMS:    Review of Systems  Constitutional: Negative for fever, chills weight loss HENT: Negative for ear pain, nosebleeds, congestion, facial swelling, rhinorrhea, neck pain, neck stiffness and ear discharge.   Respiratory: Negative for cough, shortness of breath, wheezing  Cardiovascular: Negative for chest pain, palpitations and leg swelling.  Gastrointestinal: Negative for heartburn, ++abdominal pain, vomiting, diarrhea or ++ consitpation Genitourinary: Negative for dysuria, urgency, frequency, hematuria Musculoskeletal: Negative for back pain or joint pain Neurological: Negative for dizziness, seizures, syncope, focal weakness,  numbness and headaches.  Hematological: Does not bruise/bleed easily.  Psychiatric/Behavioral: Negative for hallucinations,++ confusion, dysphoric mood    Tolerating Diet: yes      DRUG ALLERGIES:   Allergies  Allergen Reactions  . Amoxicillin Hives    Sores in mouth  . Buprenorphine Hcl Itching    Can tolerate if necessary  . Morphine And Related Itching    Can tolerate if necessary  . Sulfonamide Derivatives Hives    Sores in mouth  . Valium [Diazepam] Itching    Can tolerate if necessary    VITALS:  Blood pressure 121/66, pulse 87, temperature 97.7 F (36.5 C), temperature source Oral, resp. rate 16, height 5\' 5"  (1.651 m), weight 74.3 kg (163 lb 12.8 oz), last menstrual period 07/15/2012, SpO2 97 %.  PHYSICAL EXAMINATION:  Constitutional: Appears well-developed and well-nourished. No distress. HENT: Normocephalic. Marland Kitchen Oropharynx is clear and moist.  Eyes: Conjunctivae and EOM are normal. PERRLA, no scleral icterus.  Neck: Normal ROM. Neck supple. No JVD. No tracheal  deviation. CVS: RRR, S1/S2 +, no murmurs, no gallops, no carotid bruit.  Pulmonary: Effort and breath sounds normal, no stridor, rhonchi, wheezes, rales.  Abdominal: Soft. BS +,  no distension, tenderness, rebound or guarding.  Musculoskeletal: Normal range of motion. No edema and no tenderness.  Neuro: Alert. CN 2-12 grossly intact. No focal deficits. Skin: Skin is warm and dry. No rash noted. Psychiatric: Normal mood and affect.      LABORATORY PANEL:   CBC Recent Labs  Lab 05/19/17 0326  WBC 6.4  HGB 12.5  HCT 38.2  PLT 243   ------------------------------------------------------------------------------------------------------------------  Chemistries  Recent Labs  Lab 05/19/17 0326  NA 142  K 3.9  CL 106  CO2 26  GLUCOSE 102*  BUN 10  CREATININE 0.82  CALCIUM 8.7*  AST 16  ALT 9*  ALKPHOS 72  BILITOT 0.7   ------------------------------------------------------------------------------------------------------------------  Cardiac Enzymes No results for input(s): TROPONINI in the last 168 hours. ------------------------------------------------------------------------------------------------------------------  RADIOLOGY:  Dg Abd 1 View  Result Date: 05/19/2017 CLINICAL DATA:  Persistent constipation alternating with loose and watery stools EXAM: ABDOMEN - 1 VIEW COMPARISON:  Abdominal radiographs of May 18, 2017 FINDINGS: There is increased stool burden within the rectum likely reflecting an impaction. More proximally there is no excessive stool burden. There is small amounts of gas within normal caliber small and large bowel loops. There is no free extraluminal gas. The bony structures exhibit no acute abnormalities. There is levocurvature of the lower thoracic and lumbar spine. IMPRESSION: Increased stool burden in the pelvis likely reflecting a fecal impaction. Electronically Signed   By: David  Martinique M.D.   On: 05/19/2017 10:58   Dg Abd 2 Views  Result  Date: 05/18/2017 CLINICAL DATA:  Difficulty urinating. EXAM: ABDOMEN - 2 VIEW COMPARISON:  CT abdomen pelvis 03/31/2017. FINDINGS: No free intraperitoneal air. There is S shaped thoracolumbar scoliosis. No dilated small bowel. There is a large stool ball in the rectum. Moderate stool volume throughout the rest of the colon. IMPRESSION: Large stool ball in the colon, likely fecal impaction. Electronically Signed   By: Ulyses Jarred M.D.   On: 05/18/2017 19:54     ASSESSMENT AND PLAN:   84 year old female with a history of scoliosis  who presented with abdominal pain and constipation.  1. Constipation: Likely OIC (patient on narcotics 4 times a day at home) Continue current stool softners and add Relstar KUB today still shows significant stool burden  2. Hypothyroid; Continue synthroid 3. Depression : Continue Seroquel, Lamictal, Klonopin 4.Malnutrition, moderate: Nutritional supplements      Management plans discussed with the patient and family and they are in agreement.  CODE STATUS: FULL  TOTAL TIME TAKING CARE OF THIS PATIENT: 30 minutes.     POSSIBLE D/C tomorrow, DEPENDING ON CLINICAL CONDITION.   Jamie Pitts M.D on 05/19/2017 at 12:06 PM  Between 7am to 6pm - Pager - [phone] After 6pm go to [email or web address] - password [email or web address] [facility]  Office  [phone]  CC: Primary care physician; Ria Bush, MD  Note: This dictation was prepared with Dragon dictation along with smaller phrase technology. Any transcriptional errors that result from this process are unintentional.

## 2017-05-19 NOTE — Care Management Note (Addendum)
Case Management Note  Patient Details  Name: Jamie Pitts MRN: [id number] Date of Birth: 22-Mar-1933  Subjective/Objective:     Admitted to [facility] under observation status with the diagnosis of fecal impaction. Lives with husband, Jamie Pitts [phone]). Last seen Dr. Danise Mina 03/04/17. Seen in this office every 6 months. Prescriptions are filled at [facility] on [address]. Takes care of all basic activities of daily living herself, drives.  No Home Health. No skilled Nursing. No home oxygen in the home. No medical equipment in the home. Last fall was May 2018.  Foley Cath, in place.                Action/Plan: Will continue to follow for discharge plans  Expected Discharge Date:                  Expected Discharge Plan:     In-House Referral:     Discharge planning Services     Post Acute Care Choice:    Choice offered to:     DME Arranged:    DME Agency:     HH Arranged:    HH Agency:     Status of Service:     If discussed at [employer] of Stay Meetings, dates discussed:    Additional Comments:  Shelbie Ammons, RN MSN CCM Care Management [phone] 05/19/2017, 9:00 AM

## 2017-05-20 ENCOUNTER — Observation Stay: Payer: Medicare Other

## 2017-05-20 ENCOUNTER — Other Ambulatory Visit: Payer: Self-pay

## 2017-05-20 DIAGNOSIS — K5641 Fecal impaction: Secondary | ICD-10-CM | POA: Diagnosis not present

## 2017-05-20 DIAGNOSIS — E039 Hypothyroidism, unspecified: Secondary | ICD-10-CM | POA: Diagnosis not present

## 2017-05-20 DIAGNOSIS — K5900 Constipation, unspecified: Secondary | ICD-10-CM | POA: Diagnosis not present

## 2017-05-20 DIAGNOSIS — F329 Major depressive disorder, single episode, unspecified: Secondary | ICD-10-CM | POA: Diagnosis not present

## 2017-05-20 DIAGNOSIS — E46 Unspecified protein-calorie malnutrition: Secondary | ICD-10-CM | POA: Diagnosis not present

## 2017-05-20 DIAGNOSIS — E440 Moderate protein-calorie malnutrition: Secondary | ICD-10-CM

## 2017-05-20 MED ORDER — SENNOSIDES-DOCUSATE SODIUM 8.6-50 MG PO TABS: 2.0000 | ORAL_TABLET | Freq: Every day | ORAL | 1 refills | Status: DC | PRN

## 2017-05-20 MED ORDER — POLYETHYLENE GLYCOL 3350 17 G PO PACK: 17.0000 g | PACK | Freq: Every day | ORAL | Status: DC

## 2017-05-20 NOTE — Progress Notes (Signed)
Pt D/C to home with spouse and daughter. VSS. IV removed intact. Pt education completed. All questions answered. Pt taken out to vehicle by volunteer,

## 2017-05-20 NOTE — Discharge Summary (Addendum)
[facility] NAME: Jamie Pitts    MR#:  [id number]  DATE OF BIRTH:  05-11-1933  DATE OF ADMISSION:  05/18/2017 ADMITTING PHYSICIAN: Idelle Crouch, MD  DATE OF DISCHARGE: 05/20/2017  PRIMARY CARE PHYSICIAN: Ria Bush, MD    ADMISSION DIAGNOSIS:  Urinary retention [R33.9] Fecal impaction (HCC) [K56.41] Nausea and vomiting, intractability of vomiting not specified, unspecified vomiting type [R11.2]  DISCHARGE DIAGNOSIS:  Principal Problem:   Fecal impaction ([place]) Active Problems:   Hypothyroidism   Urinary retention   Abdominal pain   Malnutrition of moderate degree   SECONDARY DIAGNOSIS:   Past Medical History:  Diagnosis Date  . Adenomatous polyp    x1  . ALLERGIC RHINITIS 12/20/2006  . Chronic anxiety   . Chronic back pain 04/14/2007   02/2017 [facility] records reviewed - mild lumbar DDD and L SIJ pain s/p ESI latest 10/2016 with good effect  . Diverticulitis   . GERD (gastroesophageal reflux disease)    intermittent, treated with tums  . Hiatal hernia   . Hypothyroidism   . Insomnia   . Osteoporosis 2007   declined prolia  . Perforation of right tympanic membrane 09/03/2015   Chronic, seen by Dr Janace Hoard ENT   . Personal history of kidney stones    x1  . Postmenopausal atrophic vaginitis 2016   rec estrace cream - [place]  . Recurrent UTI   . Scoliosis   . Severe recurrent depression with psychosis ([place])   . Urinary retention   . Vitamin D deficiency     HOSPITAL COURSE:   84 year old female with a history of scoliosis  who presented with abdominal pain and constipation.  1. Constipation: Likely OIC (patient on narcotics 3- 4 times a day at home) She has had several bowel movements.  KUB today shows improvement in constipation.  She will continue with home stool softeners.  She was evaluated by GI who is recommending outpatient colonoscopy.  2. Hypothyroid; Continue synthroid 3. Depression :  Continue Effexor , clonazepam, Lamictal  4.Malnutrition, moderate: Nutritional supplements     DISCHARGE CONDITIONS AND DIET:   Stable for discharge on regular diet  CONSULTS OBTAINED:    DRUG ALLERGIES:   Allergies  Allergen Reactions  . Amoxicillin Hives    Sores in mouth  . Buprenorphine Hcl Itching    Can tolerate if necessary Can tolerate if necessary Can tolerate if necessary  . Morphine Itching    Can tolerate if necessary  . Morphine And Related Itching    Can tolerate if necessary  . Sulfa Antibiotics Hives    Sores in mouth Sores in mouth   . Sulfonamide Derivatives Hives    Sores in mouth  . Valium [Diazepam] Itching    Can tolerate if necessary    DISCHARGE MEDICATIONS:   Allergies as of 05/20/2017      Reactions   Amoxicillin Hives   Sores in mouth   Buprenorphine Hcl Itching   Can tolerate if necessary Can tolerate if necessary Can tolerate if necessary   Morphine Itching   Can tolerate if necessary   Morphine And Related Itching   Can tolerate if necessary   Sulfa Antibiotics Hives   Sores in mouth Sores in mouth   Sulfonamide Derivatives Hives   Sores in mouth   Valium [diazepam] Itching   Can tolerate if necessary      Medication List    STOP taking these medications   benztropine 0.5  MG tablet Commonly known as:  COGENTIN   Calcium Carb-Cholecalciferol 600-800 MG-UNIT Chew   fluticasone 50 MCG/ACT nasal spray Commonly known as:  FLONASE   hydrocortisone 25 MG suppository Commonly known as:  ANUSOL-HC   montelukast 10 MG tablet Commonly known as:  SINGULAIR   QUEtiapine 25 MG tablet Commonly known as:  SEROQUEL   Vitamin D (Ergocalciferol) 50000 units Caps capsule Commonly known as:  DRISDOL   ziprasidone 40 MG capsule Commonly known as:  GEODON     TAKE these medications   bisacodyl 10 MG suppository Commonly known as:  DULCOLAX Place 1 suppository (10 mg total) rectally daily as needed for moderate  constipation.   bismuth subsalicylate 676 MG chewable tablet Commonly known as:  PEPTO-BISMOL 524 mg ORALLY every 0.5 to 1 hour as needed, MAX: 8 doses in 24 hours (4192 mg/day); do not use for more than 2 days   CALCIUM-D PO Take 1 tablet by mouth daily.   clonazePAM 1 MG tablet Commonly known as:  KLONOPIN Take 1 tablet (1 mg total) by mouth at bedtime.   dicyclomine 10 MG capsule Commonly known as:  BENTYL Take 1 capsule (10 mg total) by mouth daily as needed for spasms.   HYDROcodone-acetaminophen 5-325 MG tablet Commonly known as:  NORCO/VICODIN Take 1 tablet by mouth every 6 (six) hours as needed for moderate pain.   lamoTRIgine 25 MG tablet Commonly known as:  LAMICTAL Take 2 tablets (50 mg total) by mouth 2 (two) times daily.   levothyroxine 88 MCG tablet Commonly known as:  SYNTHROID, LEVOTHROID 1 tablet before breakfast, 6 days a week What changed:    how much to take  how to take this  when to take this  additional instructions   loperamide 2 MG capsule Commonly known as:  IMODIUM Take 1 capsule (2 mg total) by mouth 4 (four) times daily as needed for diarrhea or loose stools.   OLANZapine 5 MG tablet Commonly known as:  ZYPREXA   ondansetron 4 MG tablet Commonly known as:  ZOFRAN Take 1 tablet (4 mg total) by mouth every 8 (eight) hours as needed for nausea or vomiting.   polyethylene glycol packet Commonly known as:  MIRALAX / GLYCOLAX Take 17 g by mouth daily.   PROAIR HFA 108 (90 Base) MCG/ACT inhaler Generic drug:  albuterol USE 2 INHALATIONS EVERY 6 HOURS AS NEEDED FOR COUGH   senna-docusate 8.6-50 MG tablet Commonly known as:  Senokot-S Take 2 tablets by mouth daily as needed for mild constipation.   venlafaxine 37.5 MG tablet Commonly known as:  EFFEXOR Take 1 tablet (37.5 mg total) by mouth 2 (two) times daily.         Today   CHIEF COMPLAINT:  No acute issues overnight   VITAL SIGNS:  Blood pressure 140/74, pulse 81,  temperature 98.3 F (36.8 C), temperature source Oral, resp. rate 18, height 5\' 5"  (1.651 m), weight 70.5 kg (155 lb 6.8 oz), last menstrual period 07/15/2012, SpO2 90 %.   REVIEW OF SYSTEMS:  Review of Systems  Constitutional: Negative.  Negative for chills, fever and malaise/fatigue.  HENT: Negative.  Negative for ear discharge, ear pain, hearing loss, nosebleeds and sore throat.   Eyes: Negative.  Negative for blurred vision and pain.  Respiratory: Negative.  Negative for cough, hemoptysis, shortness of breath and wheezing.   Cardiovascular: Negative.  Negative for chest pain, palpitations and leg swelling.  Gastrointestinal: Negative.  Negative for abdominal pain, blood in stool, diarrhea, nausea  and vomiting.  Genitourinary: Negative.  Negative for dysuria.  Musculoskeletal: Negative.  Negative for back pain.  Skin: Negative.   Neurological: Negative for dizziness, tremors, speech change, focal weakness, seizures and headaches.  Endo/Heme/Allergies: Negative.  Does not bruise/bleed easily.  Psychiatric/Behavioral: Negative.  Negative for depression, hallucinations and suicidal ideas.     PHYSICAL EXAMINATION:  GENERAL:  84 y.o.-year-old patient lying in the bed with no acute distress.  NECK:  Supple, no jugular venous distention. No thyroid enlargement, no tenderness.  LUNGS: Normal breath sounds bilaterally, no wheezing, rales,rhonchi  No use of accessory muscles of respiration.  CARDIOVASCULAR: S1, S2 normal. No murmurs, rubs, or gallops.  ABDOMEN: Soft, non-tender, non-distended. Bowel sounds present. No organomegaly or mass.  EXTREMITIES: No pedal edema, cyanosis, or clubbing.  PSYCHIATRIC: The patient is alert and oriented x 3.  SKIN: No obvious rash, lesion, or ulcer.   DATA REVIEW:   CBC Recent Labs  Lab 05/19/17 0326  WBC 6.4  HGB 12.5  HCT 38.2  PLT 243    Chemistries  Recent Labs  Lab 05/19/17 0326  NA 142  K 3.9  CL 106  CO2 26  GLUCOSE 102*  BUN  10  CREATININE 0.82  CALCIUM 8.7*  AST 16  ALT 9*  ALKPHOS 72  BILITOT 0.7    Cardiac Enzymes No results for input(s): TROPONINI in the last 168 hours.  Microbiology Results  @MICRORSLT48 @  RADIOLOGY:  Dg Abd 1 View  Result Date: 05/20/2017 CLINICAL DATA:  Chronic constipation EXAM: ABDOMEN - 1 VIEW COMPARISON:  May 19, 2017 FINDINGS: Rectum remains distended with stool. There is fairly mild stool seen elsewhere in the colon. There is no bowel dilatation or air-fluid level to suggest bowel obstruction. No free air. Lung bases are clear. There is thoracolumbar levoscoliosis. There are surgical clips in the right upper quadrant. There are phleboliths in the pelvis. IMPRESSION: Rectum remains distended with stool. Question a degree of fecal impaction distally. No bowel obstruction otherwise. No free air. Lung bases clear. Electronically Signed   By: Lowella Grip III M.D.   On: 05/20/2017 08:15   Dg Abd 1 View  Result Date: 05/19/2017 CLINICAL DATA:  Persistent constipation alternating with loose and watery stools EXAM: ABDOMEN - 1 VIEW COMPARISON:  Abdominal radiographs of May 18, 2017 FINDINGS: There is increased stool burden within the rectum likely reflecting an impaction. More proximally there is no excessive stool burden. There is small amounts of gas within normal caliber small and large bowel loops. There is no free extraluminal gas. The bony structures exhibit no acute abnormalities. There is levocurvature of the lower thoracic and lumbar spine. IMPRESSION: Increased stool burden in the pelvis likely reflecting a fecal impaction. Electronically Signed   By: David  Martinique M.D.   On: 05/19/2017 10:58   Dg Abd 2 Views  Result Date: 05/18/2017 CLINICAL DATA:  Difficulty urinating. EXAM: ABDOMEN - 2 VIEW COMPARISON:  CT abdomen pelvis 03/31/2017. FINDINGS: No free intraperitoneal air. There is S shaped thoracolumbar scoliosis. No dilated small bowel. There is a large stool ball in the  rectum. Moderate stool volume throughout the rest of the colon. IMPRESSION: Large stool ball in the colon, likely fecal impaction. Electronically Signed   By: Ulyses Jarred M.D.   On: 05/18/2017 19:54      Allergies as of 05/20/2017      Reactions   Amoxicillin Hives   Sores in mouth   Buprenorphine Hcl Itching   Can tolerate if necessary Can  tolerate if necessary Can tolerate if necessary   Morphine Itching   Can tolerate if necessary   Morphine And Related Itching   Can tolerate if necessary   Sulfa Antibiotics Hives   Sores in mouth Sores in mouth   Sulfonamide Derivatives Hives   Sores in mouth   Valium [diazepam] Itching   Can tolerate if necessary      Medication List    STOP taking these medications   benztropine 0.5 MG tablet Commonly known as:  COGENTIN   Calcium Carb-Cholecalciferol 600-800 MG-UNIT Chew   fluticasone 50 MCG/ACT nasal spray Commonly known as:  FLONASE   hydrocortisone 25 MG suppository Commonly known as:  ANUSOL-HC   montelukast 10 MG tablet Commonly known as:  SINGULAIR   QUEtiapine 25 MG tablet Commonly known as:  SEROQUEL   Vitamin D (Ergocalciferol) 50000 units Caps capsule Commonly known as:  DRISDOL   ziprasidone 40 MG capsule Commonly known as:  GEODON     TAKE these medications   bisacodyl 10 MG suppository Commonly known as:  DULCOLAX Place 1 suppository (10 mg total) rectally daily as needed for moderate constipation.   bismuth subsalicylate 790 MG chewable tablet Commonly known as:  PEPTO-BISMOL 524 mg ORALLY every 0.5 to 1 hour as needed, MAX: 8 doses in 24 hours (4192 mg/day); do not use for more than 2 days   CALCIUM-D PO Take 1 tablet by mouth daily.   clonazePAM 1 MG tablet Commonly known as:  KLONOPIN Take 1 tablet (1 mg total) by mouth at bedtime.   dicyclomine 10 MG capsule Commonly known as:  BENTYL Take 1 capsule (10 mg total) by mouth daily as needed for spasms.   HYDROcodone-acetaminophen 5-325 MG  tablet Commonly known as:  NORCO/VICODIN Take 1 tablet by mouth every 6 (six) hours as needed for moderate pain.   lamoTRIgine 25 MG tablet Commonly known as:  LAMICTAL Take 2 tablets (50 mg total) by mouth 2 (two) times daily.   levothyroxine 88 MCG tablet Commonly known as:  SYNTHROID, LEVOTHROID 1 tablet before breakfast, 6 days a week What changed:    how much to take  how to take this  when to take this  additional instructions   loperamide 2 MG capsule Commonly known as:  IMODIUM Take 1 capsule (2 mg total) by mouth 4 (four) times daily as needed for diarrhea or loose stools.   OLANZapine 5 MG tablet Commonly known as:  ZYPREXA   ondansetron 4 MG tablet Commonly known as:  ZOFRAN Take 1 tablet (4 mg total) by mouth every 8 (eight) hours as needed for nausea or vomiting.   polyethylene glycol packet Commonly known as:  MIRALAX / GLYCOLAX Take 17 g by mouth daily.   PROAIR HFA 108 (90 Base) MCG/ACT inhaler Generic drug:  albuterol USE 2 INHALATIONS EVERY 6 HOURS AS NEEDED FOR COUGH   senna-docusate 8.6-50 MG tablet Commonly known as:  Senokot-S Take 2 tablets by mouth daily as needed for mild constipation.   venlafaxine 37.5 MG tablet Commonly known as:  EFFEXOR Take 1 tablet (37.5 mg total) by mouth 2 (two) times daily.          Management plans discussed with the patient and she is in agreement. Stable for discharge   Patient should follow up with pcp  CODE STATUS:     Code Status Orders  (From admission, onward)        Start     Ordered   05/18/17 2239  Full code  Continuous     05/18/17 2238    Code Status History    Date Active Date Inactive Code Status Order ID Comments User Context   05/08/2014 0736 05/09/2014 1911 Full Code [id number]  Lavina Hamman, MD Inpatient   03/14/2012 0456 03/15/2012 2001 Full Code [id number]  Schinlever, Arville Lime, PA-C ED      TOTAL TIME TAKING CARE OF THIS PATIENT: 38 minutes.    Note: This dictation  was prepared with Dragon dictation along with smaller phrase technology. Any transcriptional errors that result from this process are unintentional.  Tim Wilhide M.D on 05/20/2017 at 4:39 PM  Between 7am to 6pm - Pager - [phone] After 6pm go to [email or web address] - password [email or web address] [facility]  Office  [phone]  CC: Primary care physician; Ria Bush, MD

## 2017-05-20 NOTE — Care Management (Signed)
Spoke with Ms. Delph at the bedside. Discussed home health services in the home. Declining these services at this time. States "My husband and I do fine in the home." Dr. Benjie Karvonen updated.  Discharge to home today per Dr. Benjie Karvonen.  Shelbie Ammons RN MSN CCM Care Management [phone]

## 2017-05-21 ENCOUNTER — Ambulatory Visit: Payer: Medicare Other | Admitting: Gastroenterology

## 2017-05-21 ENCOUNTER — Encounter: Payer: Self-pay | Admitting: Gastroenterology

## 2017-05-22 ENCOUNTER — Telehealth: Payer: Self-pay

## 2017-05-22 NOTE — Telephone Encounter (Signed)
Transition Care Management Follow-up Telephone Call   Date discharged?05/20/17  PATIENT REFUSED TCM CALL, husband answered a few questions but was agitated by the call and stated "they were fine, do not need anything and she manages her own medications so he couldn't help me with that.     How have you been since you were released from the hospital? "she is doing okay"   Do you understand why you were in the hospital? Yes   Do you understand the discharge instructions? Yes   Where were you discharged to? Home   Items Reviewed:  Medications reviewed: No - patient refused  Allergies reviewed: no - patient refused  Dietary changes reviewed: No  Referrals reviewed: no    Functional Questionnaire:   Activities of Daily Living (ADLs):   She states they are independent in the following: " She doesn't need any help" States they require assistance with the following:   Any transportation issues/concerns?: none  Any patient concerns? no   Confirmed importance and date/time of follow-up visits scheduled Yes   Provider Appointment booked with Dr. Danise Mina 05/27/17 at 915am.   Confirmed with patient if condition begins to worsen call PCP or go to the ER.  Patient was given the office number and encouraged to call back with question or concerns.  : Yes

## 2017-05-27 ENCOUNTER — Ambulatory Visit (INDEPENDENT_AMBULATORY_CARE_PROVIDER_SITE_OTHER)
Admission: RE | Admit: 2017-05-27 | Discharge: 2017-05-27 | Disposition: A | Discharge status: Home / Self Care | Payer: Medicare Other | Source: Ambulatory Visit | Attending: Family Medicine | Admitting: Family Medicine

## 2017-05-27 ENCOUNTER — Encounter: Payer: Self-pay | Admitting: Family Medicine

## 2017-05-27 ENCOUNTER — Ambulatory Visit (INDEPENDENT_AMBULATORY_CARE_PROVIDER_SITE_OTHER): Payer: Medicare Other | Admitting: Family Medicine

## 2017-05-27 VITALS — BP 110/68 | HR 114 | Temp 98.3°F | Wt 151.2 lb

## 2017-05-27 DIAGNOSIS — K5900 Constipation, unspecified: Secondary | ICD-10-CM | POA: Diagnosis not present

## 2017-05-27 DIAGNOSIS — F333 Major depressive disorder, recurrent, severe with psychotic symptoms: Secondary | ICD-10-CM

## 2017-05-27 DIAGNOSIS — K5641 Fecal impaction: Secondary | ICD-10-CM

## 2017-05-27 DIAGNOSIS — E440 Moderate protein-calorie malnutrition: Secondary | ICD-10-CM

## 2017-05-27 DIAGNOSIS — M545 Low back pain: Secondary | ICD-10-CM

## 2017-05-27 DIAGNOSIS — Z79891 Long term (current) use of opiate analgesic: Secondary | ICD-10-CM

## 2017-05-27 DIAGNOSIS — G8929 Other chronic pain: Secondary | ICD-10-CM

## 2017-05-27 LAB — COMPREHENSIVE METABOLIC PANEL
ALT: 10 U/L (ref 0–35)
AST: 17 U/L (ref 0–37)
Albumin: 3.7 g/dL (ref 3.5–5.2)
Alkaline Phosphatase: 84 U/L (ref 39–117)
BUN: 10 mg/dL (ref 6–23)
CHLORIDE: 103 meq/L (ref 96–112)
CO2: 26 meq/L (ref 19–32)
Calcium: 9.9 mg/dL (ref 8.4–10.5)
Creatinine, Ser: 0.99 mg/dL (ref 0.40–1.20)
GFR: 57.13 mL/min — ABNORMAL LOW (ref >60.00)
Glucose, Bld: 99 mg/dL (ref 70–99)
POTASSIUM: 4.5 meq/L (ref 3.5–5.1)
Sodium: 139 mEq/L (ref 135–145)
Total Bilirubin: 0.6 mg/dL (ref 0.2–1.2)
Total Protein: 7.2 g/dL (ref 6.0–8.3)

## 2017-05-27 LAB — CBC WITH DIFFERENTIAL/PLATELET
BASOS PCT: 0.6 % (ref 0.0–3.0)
Basophils Absolute: 0.1 10*3/uL (ref 0.0–0.1)
EOS ABS: 0.1 10*3/uL (ref 0.0–0.7)
EOS PCT: 0.7 % (ref 0.0–5.0)
HEMATOCRIT: 43.8 % (ref 36.0–46.0)
HEMOGLOBIN: 14.4 g/dL (ref 12.0–15.0)
Lymphocytes Relative: 18 % (ref 12.0–46.0)
Lymphs Abs: 2 10*3/uL (ref 0.7–4.0)
MCHC: 32.8 g/dL (ref 30.0–36.0)
MCV: 90.1 fl (ref 78.0–100.0)
MONOS PCT: 7.2 % (ref 3.0–12.0)
Monocytes Absolute: 0.8 10*3/uL (ref 0.1–1.0)
NEUTROS ABS: 8.2 10*3/uL — AB (ref 1.4–7.7)
Neutrophils Relative %: 73.5 % (ref 43.0–77.0)
Platelets: 376 10*3/uL (ref 150.0–400.0)
RBC: 4.86 Mil/uL (ref 3.87–5.11)
RDW: 15.3 % (ref 11.5–15.5)
WBC: 11.1 10*3/uL — ABNORMAL HIGH (ref 4.0–10.5)

## 2017-05-27 MED ORDER — POLYETHYLENE GLYCOL 3350 17 GM/SCOOP PO POWD: 8.5000 g | Freq: Every day | ORAL | 1 refills | Status: DC | PRN

## 2017-05-27 NOTE — Progress Notes (Signed)
BP 110/68 (BP Location: Right Arm, Patient Position: Sitting, Cuff Size: Normal)   Pulse (!) 114   Temp 98.3 F (36.8 C) (Oral)   Wt 151 lb 4 oz (68.6 kg)   LMP 07/15/2012   SpO2 95%   BMI 25.17 kg/m    CC: hosp /fu visit Subjective:    Patient ID: Jamie Pitts, female    DOB: [birth year], 84 y.o.   MRN: [id number]  HPI: Jamie Pitts is a 84 y.o. female presenting on 05/27/2017 for Hospitalization Follow-up (Sweats, nausea, overactive bowels, rectum pain. Pt states that she isn't getting better.)   Recent hospitalization for abdominal pain and constipation thought brought on by narcotics. Discharge KUB showed improved constipation. Discharged on home stool softeners. GI recommended outpatient colonoscopy.   Since she's been home, doesn't feel well - diaphoretic, nauseated, rectal pain, gassiness, frequent loose stools - mushy. No constipation. Yesterday went to the bathroom 9 times.  No fevers/chills, vomiting, blood in stool.  She is not currently taking anything for her stools. She has senokot-S at home but hasn't been taking. She does not have miralax or dulcolax suppositories at home.  She is staying hydrated drinking plenty of water. Endorses good appetite. Despite this, ongoing weight loss noted.   She is taking hydrocodone up to QID.   She had ER eval 03/2017 with CT abd showing large rectal impaction with stercoral colitis.  KUBs since then showing chronic significant constipation.   DATE OF ADMISSION:  05/18/2017    DATE OF DISCHARGE: 05/20/2017 Hosp f/u TCM phone call completed 05/22/2017  Discharge diagnosis: Principal Problem:   Fecal impaction [facility]) Active Problems:   Hypothyroidism   Urinary retention   Abdominal pain   Malnutrition of moderate degree  Relevant past medical, surgical, family and social history reviewed and updated as indicated. Interim medical history since our last visit reviewed. Allergies and medications reviewed and updated. Outpatient  Medications Prior to Visit  Medication Sig Dispense Refill  . bisacodyl (DULCOLAX) 10 MG suppository Place 1 suppository (10 mg total) rectally daily as needed for moderate constipation. 12 suppository 0  . bismuth subsalicylate (PEPTO-BISMOL) 262 MG chewable tablet 524 mg ORALLY every 0.5 to 1 hour as needed, MAX: 8 doses in 24 hours (4192 mg/day); do not use for more than 2 days 30 tablet 0  . Calcium Carbonate-Vitamin D (CALCIUM-D PO) Take 1 tablet by mouth daily.    . clonazePAM (KLONOPIN) 1 MG tablet Take 1 tablet (1 mg total) by mouth at bedtime. 30 tablet 2  . dicyclomine (BENTYL) 10 MG capsule Take 1 capsule (10 mg total) by mouth daily as needed for spasms. 90 capsule 1  . HYDROcodone-acetaminophen (NORCO/VICODIN) 5-325 MG tablet Take 1 tablet by mouth every 6 (six) hours as needed for moderate pain.    Marland Kitchen lamoTRIgine (LAMICTAL) 25 MG tablet Take 2 tablets (50 mg total) by mouth 2 (two) times daily. 360 tablet 2  . levothyroxine (SYNTHROID, LEVOTHROID) 88 MCG tablet 1 tablet before breakfast, 6 days a week (Patient taking differently: Take 88 mcg by mouth daily before breakfast. ) 90 tablet 1  . loperamide (IMODIUM) 2 MG capsule Take 1 capsule (2 mg total) by mouth 4 (four) times daily as needed for diarrhea or loose stools. 12 capsule 0  . OLANZapine (ZYPREXA) 5 MG tablet     . ondansetron (ZOFRAN) 4 MG tablet Take 1 tablet (4 mg total) by mouth every 8 (eight) hours as needed for nausea or vomiting. 20 tablet 0  .  PROAIR HFA 108 (90 Base) MCG/ACT inhaler USE 2 INHALATIONS EVERY 6 HOURS AS NEEDED FOR COUGH 25.5 g 1  . senna-docusate (SENOKOT-S) 8.6-50 MG tablet Take 2 tablets by mouth daily as needed for mild constipation. 30 tablet 1  . venlafaxine (EFFEXOR) 37.5 MG tablet Take 1 tablet (37.5 mg total) by mouth 2 (two) times daily. 180 tablet 2  . polyethylene glycol (MIRALAX / GLYCOLAX) packet Take 17 g by mouth daily. 14 each 0   No facility-administered medications prior to visit.        Per HPI unless specifically indicated in ROS section below Review of Systems     Objective:    BP 110/68 (BP Location: Right Arm, Patient Position: Sitting, Cuff Size: Normal)   Pulse (!) 114   Temp 98.3 F (36.8 C) (Oral)   Wt 151 lb 4 oz (68.6 kg)   LMP 07/15/2012   SpO2 95%   BMI 25.17 kg/m   Wt Readings from Last 3 Encounters:  05/27/17 151 lb 4 oz (68.6 kg)  05/20/17 155 lb 6.8 oz (70.5 kg)  03/04/17 172 lb (78 kg)    Physical Exam  Constitutional: She appears well-developed and well-nourished. No distress.  HENT:  Mouth/Throat: Oropharynx is clear and moist. No oropharyngeal exudate.  Neck: Normal range of motion. Neck supple.  Cardiovascular: Normal rate, regular rhythm, normal heart sounds and intact distal pulses.  No murmur heard. Pulmonary/Chest: Effort normal and breath sounds normal. No respiratory distress. She has no wheezes. She has no rales.  Abdominal: Soft. Normal appearance and bowel sounds are normal. She exhibits no distension and no mass. There is no hepatosplenomegaly. There is tenderness (mild) in the right lower quadrant, suprapubic area and left lower quadrant. There is no rigidity, no rebound, no guarding, no CVA tenderness and negative Murphy's sign.  Genitourinary:  Genitourinary Comments: Soft tissue swelling perianally  Musculoskeletal: She exhibits no edema.  Skin: Skin is warm and dry. No rash noted.  Nursing note and vitals reviewed.  Results for orders placed or performed in visit on 05/27/17  Comprehensive metabolic panel  Result Value Ref Range   Sodium 139 135 - 145 mEq/L   Potassium 4.5 3.5 - 5.1 mEq/L   Chloride 103 96 - 112 mEq/L   CO2 26 19 - 32 mEq/L   Glucose, Bld 99 70 - 99 mg/dL   BUN 10 6 - 23 mg/dL   Creatinine, Ser 0.99 0.40 - 1.20 mg/dL   Total Bilirubin 0.6 0.2 - 1.2 mg/dL   Alkaline Phosphatase 84 39 - 117 U/L   AST 17 0 - 37 U/L   ALT 10 0 - 35 U/L   Total Protein 7.2 6.0 - 8.3 g/dL   Albumin 3.7 3.5 - 5.2  g/dL   Calcium 9.9 8.4 - 10.5 mg/dL   GFR 57.13 (L) >60.00 mL/min  CBC with Differential/Platelet  Result Value Ref Range   WBC 11.1 (H) 4.0 - 10.5 K/uL   RBC 4.86 3.87 - 5.11 Mil/uL   Hemoglobin 14.4 12.0 - 15.0 g/dL   HCT 43.8 36.0 - 46.0 %   MCV 90.1 78.0 - 100.0 fl   MCHC 32.8 30.0 - 36.0 g/dL   RDW 15.3 11.5 - 15.5 %   Platelets 376.0 150.0 - 400.0 K/uL   Neutrophils Relative % 73.5 43.0 - 77.0 %   Lymphocytes Relative 18.0 12.0 - 46.0 %   Monocytes Relative 7.2 3.0 - 12.0 %   Eosinophils Relative 0.7 0.0 - 5.0 %  Basophils Relative 0.6 0.0 - 3.0 %   Neutro Abs 8.2 (H) 1.4 - 7.7 K/uL   Lymphs Abs 2.0 0.7 - 4.0 K/uL   Monocytes Absolute 0.8 0.1 - 1.0 K/uL   Eosinophils Absolute 0.1 0.0 - 0.7 K/uL   Basophils Absolute 0.1 0.0 - 0.1 K/uL  DG Abd 1 View CLINICAL DATA:  Evaluate constipation  EXAM: ABDOMEN - 1 VIEW  COMPARISON:  05/20/2017  FINDINGS: Large stool burden in the rectosigmoid colon most compatible with fecal impaction, unchanged. No evidence of bowel obstruction. No free air organomegaly. Prior cholecystectomy.  IMPRESSION: Large stool burden in the rectosigmoid colon compatible with fecal impaction. No real change.  Electronically Signed   By: Rolm Baptise M.D.   On: 05/27/2017 12:24      Assessment & Plan:   Problem List Items Addressed This Visit    Chronic back pain    Followed by pain management, on daily narcotic.       Constipation - Primary    Endorses frequent loose stools however I worry that she has ongoing narcotic induced constipation. She has not been taking any bowel regimen. Check KUB today - again worsening stool burden compared to prior to discharge. I advised she restart miralax 1/2 capful daily - send to pharmacy. Will go ahead and refer to GI to establish care as well.  Check labs today to help r/o electrolyte imbalance from fecal impaction or colitis.       Relevant Orders   DG Abd 1 View (Completed)   Comprehensive  metabolic panel (Completed)   CBC with Differential/Platelet (Completed)   Ambulatory referral to Gastroenterology   Depression, major, recurrent, severe with psychosis ([place])    H/o this, complicates treatment. Encouraged compliance with current meds.      Fecal impaction (HCC)   Long-term current use of opiate analgesic   Malnutrition of moderate degree    Concern with ongoing weight loss despite pt endorsing good appetite.      Relevant Orders   Ambulatory referral to Gastroenterology       Meds ordered this encounter  Medications  . polyethylene glycol powder (GLYCOLAX/MIRALAX) powder    Sig: Take 8.5 g by mouth daily as needed for moderate constipation.    Dispense:  3350 g    Refill:  1   Orders Placed This Encounter  Procedures  . DG Abd 1 View    Standing Status:   Future    Number of Occurrences:   1    Standing Expiration Date:   07/28/2018    Order Specific Question:   Reason for Exam (SYMPTOM  OR DIAGNOSIS REQUIRED)    Answer:   eval constipation    Order Specific Question:   Preferred imaging location?    Answer:   [facility]    Order Specific Question:   Radiology Contrast Protocol - do NOT remove file path    Answer:   [email or web address]  . Comprehensive metabolic panel  . CBC with Differential/Platelet  . Ambulatory referral to Gastroenterology    Referral Priority:   Routine    Referral Type:   Consultation    Referral Reason:   Specialty Services Required    Number of Visits Requested:   1    Follow up plan: Return in about 3 weeks (around 06/17/2017), or if symptoms worsen or fail to improve, for follow up visit.  Ria Bush, MD

## 2017-05-27 NOTE — Patient Instructions (Addendum)
Xray and labs today Start taking miralax 1/2 capful daily - sent to pharmacy.  Call us on Thursday or Friday with how you're doing.  Return in 2-3 weeks for follow up visit

## 2017-05-28 ENCOUNTER — Telehealth: Payer: Self-pay

## 2017-05-28 ENCOUNTER — Telehealth: Payer: Self-pay | Admitting: Family Medicine

## 2017-05-28 NOTE — Assessment & Plan Note (Addendum)
Endorses frequent loose stools however I worry that she has ongoing narcotic induced constipation. She has not been taking any bowel regimen. Check KUB today - again worsening stool burden compared to prior to discharge. I advised she restart miralax 1/2 capful daily - send to pharmacy. Will go ahead and refer to GI to establish care as well.  Check labs today to help r/o electrolyte imbalance from fecal impaction or colitis.

## 2017-05-28 NOTE — Telephone Encounter (Signed)
Dr Johney Maine is Psychologist, sport and exercise. I have placed referral to GI not gen surgery.

## 2017-05-28 NOTE — Telephone Encounter (Signed)
Spoke with pt relaying message and instructions per Dr. Henriette Combs understanding and requests to see Dr. Johney Maine in [place] for GI referral.

## 2017-05-28 NOTE — Telephone Encounter (Signed)
Left message for patient to call Anastasiya back in regards to a referral-Anastasiya V Hopkins, RMA   

## 2017-05-28 NOTE — Assessment & Plan Note (Signed)
Followed by pain management, on daily narcotic.

## 2017-05-28 NOTE — Assessment & Plan Note (Addendum)
Concern with ongoing weight loss despite pt endorsing good appetite.

## 2017-05-28 NOTE — Telephone Encounter (Signed)
Please notify - labs were overall stable. xray showed persistent significant constipation with fecal impaction - please have her take 1/2-1 capful of miralax daily to help. Increase water intake. I want to refer her to GI as well for persistent trouble with constipation.

## 2017-05-28 NOTE — Assessment & Plan Note (Signed)
H/o this, complicates treatment. Encouraged compliance with current meds.

## 2017-05-30 ENCOUNTER — Encounter: Payer: Self-pay | Admitting: Physician Assistant

## 2017-06-02 ENCOUNTER — Other Ambulatory Visit: Payer: Self-pay

## 2017-06-03 ENCOUNTER — Encounter: Payer: Self-pay | Admitting: Gastroenterology

## 2017-06-03 ENCOUNTER — Encounter (INDEPENDENT_AMBULATORY_CARE_PROVIDER_SITE_OTHER): Payer: Self-pay

## 2017-06-03 ENCOUNTER — Ambulatory Visit (INDEPENDENT_AMBULATORY_CARE_PROVIDER_SITE_OTHER): Payer: Medicare Other | Admitting: Gastroenterology

## 2017-06-03 VITALS — BP 118/71 | HR 112 | Temp 98.3°F | Ht 65.0 in | Wt 154.6 lb

## 2017-06-03 DIAGNOSIS — K5641 Fecal impaction: Secondary | ICD-10-CM | POA: Diagnosis not present

## 2017-06-03 DIAGNOSIS — K5909 Other constipation: Secondary | ICD-10-CM

## 2017-06-03 NOTE — Progress Notes (Signed)
Jamie Darby, MD [address]  [place]  [place], [place] [address]  Main: [phone]  Fax: [phone]    Gastroenterology Consultation  Referring Provider:     Ria Bush, MD Primary Care Physician:  Ria Bush, MD Primary Gastroenterologist:  Dr. Cephas Pitts Reason for Consultation:     Fecal impaction        HPI:   Gradie Butrick is a 84 y.o. female referred by Dr. Ria Bush, MD  for consultation & management of fecal impaction and chronic constipation. She reports that she has suffered from constipation almost all her life. She had worsening of constipation and reported fecal impaction with severe rectal pain that started in early February, 2019. She had a CT at that time which confirmed fecal impaction in the rectum with mild inflammation. Was given enemas and discharged home. She presented to the ER exactly a month later with overflow diarrhea and significant gas. She was given Imodium and Pepto-Bismol from the ER. She presented again on 05/18/2017 secondary to unable to urinate. She had a repeat CT which demonstrated fecal impaction in the rectum associated with stercoral colitis which was confirmed on clinical exam as well. She was admitted to the hospital at that time. she received GoLYTELY and Fleet enemas in the hospital. Abdominal x-ray revealed persistent fecal impaction in the rectum. She was discharged home on MiraLAX daily. She seen by Dr. Danise Mina on 05/27/2017 as hospital follow-up, her symptoms persisted which were predominantly significant rectal discomfort, stool leakage, incontinence, decreased urination. She had an abdominal x-ray which revealed increasing stool burden. Therefore, she is referred to GI for further management.  Patient is currently taking MiraLAX daily. She reports lower abdominal discomfort, rectal pressure. Her stools are mushy, nonbloody. She lost about 20-30 pounds since she has been evacuating her stool in last  couple of months. She is also on chronic narcotic medications as needed for pain  NSAIDs: none  Antiplts/Anticoagulants/Anti thrombotics: none  GI Procedures: Underwent colonoscopy in 08/2005 Diminutive polyp in the ascending colon, path showed tubular adenoma with no evidence of high-grade dysplasia EGD 08/2005 small hiatal hernia  Past Medical History:  Diagnosis Date  . Adenomatous polyp    x1  . ALLERGIC RHINITIS 12/20/2006  . Chronic anxiety   . Chronic back pain 04/14/2007   02/2017 [facility] records reviewed - mild lumbar DDD and L SIJ pain s/p ESI latest 10/2016 with good effect  . Diverticulitis   . GERD (gastroesophageal reflux disease)    intermittent, treated with tums  . Hiatal hernia   . Hypothyroidism   . Insomnia   . Osteoporosis 2007   declined prolia  . Perforation of right tympanic membrane 09/03/2015   Chronic, seen by Dr Janace Hoard ENT   . Personal history of kidney stones    x1  . Postmenopausal atrophic vaginitis 2016   rec estrace cream - [place]  . Recurrent UTI   . Scoliosis   . Severe recurrent depression with psychosis ([place])   . Urinary retention   . Vitamin D deficiency     Past Surgical History:  Procedure Laterality Date  . ABDOMINAL HYSTERECTOMY  1979   ectopic pregnancy with one ovary removed  . APPENDECTOMY  1954  . CATARACT EXTRACTION, BILATERAL  11/2015  . CHOLECYSTECTOMY    . ESOPHAGOGASTRODUODENOSCOPY    . KNEE SURGERY Left 2012   L medial meniscal tear  . OOPHORECTOMY Right 1965   Ectopic pregnancy, removal of right ovary and tube-  [place] Right   . TONSILLECTOMY       Current Outpatient Medications:  .  Calcium Carbonate-Vitamin D (CALCIUM-D PO), Take 1 tablet by mouth daily., Disp: , Rfl:  .  clonazePAM (KLONOPIN) 1 MG tablet, Take 1 tablet (1 mg total) by mouth at bedtime., Disp: 30 tablet, Rfl: 2 .  dicyclomine (BENTYL) 10 MG capsule, Take 1 capsule (10 mg total) by mouth daily as needed for spasms., Disp: 90  capsule, Rfl: 1 .  levothyroxine (SYNTHROID, LEVOTHROID) 88 MCG tablet, 1 tablet before breakfast, 6 days a week (Patient taking differently: Take 88 mcg by mouth daily before breakfast. ), Disp: 90 tablet, Rfl: 1 .  loperamide (IMODIUM) 2 MG capsule, Take 1 capsule (2 mg total) by mouth 4 (four) times daily as needed for diarrhea or loose stools., Disp: 12 capsule, Rfl: 0 .  OLANZapine (ZYPREXA) 5 MG tablet, , Disp: , Rfl:  .  ondansetron (ZOFRAN) 4 MG tablet, Take 1 tablet (4 mg total) by mouth every 8 (eight) hours as needed for nausea or vomiting., Disp: 20 tablet, Rfl: 0 .  polyethylene glycol powder (GLYCOLAX/MIRALAX) powder, Take 8.5 g by mouth daily as needed for moderate constipation., Disp: 3350 g, Rfl: 1 .  polyethylene glycol powder (GLYCOLAX/MIRALAX) powder, polyethylene glycol 3350 17 gram oral powder packet  TK 17 GMS D, Disp: , Rfl:  .  PROAIR HFA 108 (90 Base) MCG/ACT inhaler, USE 2 INHALATIONS EVERY 6 HOURS AS NEEDED FOR COUGH, Disp: 25.5 g, Rfl: 1 .  venlafaxine (EFFEXOR) 37.5 MG tablet, Take 1 tablet (37.5 mg total) by mouth 2 (two) times daily., Disp: 180 tablet, Rfl: 2 .  bisacodyl (DULCOLAX) 10 MG suppository, Place 1 suppository (10 mg total) rectally daily as needed for moderate constipation. (Patient not taking: Reported on 06/03/2017), Disp: 12 suppository, Rfl: 0 .  bismuth subsalicylate (PEPTO-BISMOL) 262 MG chewable tablet, 524 mg ORALLY every 0.5 to 1 hour as needed, MAX: 8 doses in 24 hours (4192 mg/day); do not use for more than 2 days (Patient not taking: Reported on 06/03/2017), Disp: 30 tablet, Rfl: 0 .  HYDROcodone-acetaminophen (NORCO/VICODIN) 5-325 MG tablet, Take 1 tablet by mouth every 6 (six) hours as needed for moderate pain., Disp: , Rfl:  .  lamoTRIgine (LAMICTAL) 25 MG tablet, Take 2 tablets (50 mg total) by mouth 2 (two) times daily. (Patient not taking: Reported on 06/03/2017), Disp: 360 tablet, Rfl: 2 .  senna-docusate (SENOKOT-S) 8.6-50 MG tablet, Take 2  tablets by mouth daily as needed for mild constipation. (Patient not taking: Reported on 06/03/2017), Disp: 30 tablet, Rfl: 1   Family History  Problem Relation Age of Onset  . Anxiety disorder Grandchild   . Depression Grandchild   . Cancer Sister        lung  . Healthy Sister   . Diabetes Mother   . Diabetes Father   . Schizophrenia Sister   . Diabetes Sister   . Diabetes Brother   . Cancer Sister        kidney  . Alcohol abuse Neg Hx   . Bipolar disorder Neg Hx   . Dementia Neg Hx   . Drug abuse Neg Hx   . OCD Neg Hx   . Paranoid behavior Neg Hx   . Seizures Neg Hx   . Sexual abuse Neg Hx   . Physical abuse Neg Hx      Social History   Tobacco Use  . Smoking status: Never Smoker  .  Smokeless tobacco: Never Used  Substance Use Topics  . Alcohol use: No  . Drug use: No    Allergies as of 06/03/2017 - Review Complete 06/03/2017  Allergen Reaction Noted  . Amoxicillin Hives 11/05/2012  . Buprenorphine hcl Itching 02/23/2013  . Morphine Itching 02/23/2013  . Morphine and related Itching 02/23/2013  . Sulfa antibiotics Hives 11/03/2006  . Sulfonamide derivatives Hives 11/03/2006  . Valium [diazepam] Itching 02/23/2013    Review of Systems:    All systems reviewed and negative except where noted in HPI.   Physical Exam:  BP 118/71   Pulse (!) 112   Temp 98.3 F (36.8 C) (Oral)   Ht 5\' 5"  (1.651 m)   Wt 154 lb 9.6 oz (70.1 kg)   LMP 07/15/2012   BMI 25.73 kg/m  Patient's last menstrual period was 07/15/2012.  General:   Alert,  Well-developed, well-nourished, pleasant and cooperative in NAD, uncomfortable sitting down Head:  Normocephalic and atraumatic. Eyes:  Sclera clear, no icterus.   Conjunctiva pink. Ears:  Normal auditory acuity. Nose:  No deformity, discharge, or lesions. Mouth:  No deformity or lesions,oropharynx pink & moist. Neck:  Supple; no masses or thyromegaly. Lungs:  Respirations even and unlabored.  Clear throughout to auscultation.    No wheezes, crackles, or rhonchi. No acute distress. Heart:  Regular rate and rhythm; no murmurs, clicks, rubs, or gallops. Abdomen:  Normal bowel sounds. Soft, mild lower abdominal tenderness and non-distended without masses, hepatosplenomegaly or hernias noted.  No guarding or rebound tenderness.   Rectal: she had soft brown stool on diaper, rectal exam revealed large amount of solid stool in the rectum and I manually disimpacted, softened the stool. Subsequently, patient had a large BM while she was in the clinic and felt significantly relieved and completely emptied. She said she is also able to urinate without any discomfort Msk:  Symmetrical without gross deformities. Good, equal movement & strength bilaterally. Pulses:  Normal pulses noted. Extremities:  No clubbing or edema.  No cyanosis. Neurologic:  Alert and oriented x3;  grossly normal neurologically. Skin:  Intact without significant lesions or rashes. No jaundice. Psych:  Alert and cooperative. Normal mood and affect.  Imaging Studies: reviewed  Assessment and Plan:   Tanis Hensarling is a 84 y.o. Caucasian female with history of hypothyroidism on Synthroid, chronic back pain on chronic opioid use as needed, chronic constipation, fecal impaction since 03/2017 seen in consultation for fecal impaction and overflow incontinence. Manual disimpaction was performed in clinic today, resulted in large bowel movement and emptying of rectum  - Given her information on high fiber diet, fiber supplements - Start linaclotide 145 MCG daily  History of tubular adenoma of colon Recommend colonoscopy, will discuss with patient about it during next visit   Follow up in 2-3 weeks   Jamie Darby, MD

## 2017-06-04 ENCOUNTER — Other Ambulatory Visit: Payer: Self-pay

## 2017-06-04 ENCOUNTER — Telehealth: Payer: Self-pay

## 2017-06-04 NOTE — Telephone Encounter (Signed)
Patient contacted office stated that she did not think she needed the Linzess at this time since having the impaction.  She said prior to the impaction she had been constipated but now her bm are loose.  She will let us know if this changes.  Thanks [employer]

## 2017-06-05 ENCOUNTER — Ambulatory Visit (INDEPENDENT_AMBULATORY_CARE_PROVIDER_SITE_OTHER): Payer: Medicare Other | Admitting: Gastroenterology

## 2017-06-05 ENCOUNTER — Encounter: Payer: Self-pay | Admitting: Gastroenterology

## 2017-06-05 VITALS — BP 104/66 | HR 103 | Ht 65.0 in | Wt 154.0 lb

## 2017-06-05 DIAGNOSIS — K5903 Drug induced constipation: Secondary | ICD-10-CM | POA: Diagnosis not present

## 2017-06-05 DIAGNOSIS — K5641 Fecal impaction: Secondary | ICD-10-CM | POA: Diagnosis not present

## 2017-06-05 DIAGNOSIS — T402X5A Adverse effect of other opioids, initial encounter: Secondary | ICD-10-CM

## 2017-06-05 MED ORDER — METHYLNALTREXONE BROMIDE 150 MG PO TABS: 450.0000 mg | ORAL_TABLET | Freq: Every day | ORAL | 0 refills | Status: AC

## 2017-06-05 NOTE — Progress Notes (Signed)
Jamie Darby, MD [address]  [place]  [place], [place] [address]  Main: [phone]  Fax: [phone]    Gastroenterology Consultation  Referring Provider:     Ria Bush, MD Primary Care Physician:  Ria Bush, MD Primary Gastroenterologist:  Dr. Cephas Pitts Reason for Consultation:     Fecal impaction        HPI:   Jamie Pitts is a 84 y.o. female referred by Dr. Ria Bush, MD  for consultation & management of fecal impaction and chronic constipation. She reports that she has suffered from constipation almost all her life. She had worsening of constipation and reported fecal impaction with severe rectal pain that started in early February, 2019. She had a CT at that time which confirmed fecal impaction in the rectum with mild inflammation. Was given enemas and discharged home. She presented to the ER exactly a month later with overflow diarrhea and significant gas. She was given Imodium and Pepto-Bismol from the ER. She presented again on 05/18/2017 secondary to unable to urinate. She had a repeat CT which demonstrated fecal impaction in the rectum associated with stercoral colitis which was confirmed on clinical exam as well. She was admitted to the hospital at that time. she received GoLYTELY and Fleet enemas in the hospital. Abdominal x-ray revealed persistent fecal impaction in the rectum. She was discharged home on MiraLAX daily. She seen by Dr. Danise Mina on 05/27/2017 as hospital follow-up, her symptoms persisted which were predominantly significant rectal discomfort, stool leakage, incontinence, decreased urination. She had an abdominal x-ray which revealed increasing stool burden. Therefore, she is referred to GI for further management.  Patient is currently taking MiraLAX daily. She reports lower abdominal discomfort, rectal pressure. Her stools are mushy, nonbloody. She lost about 20-30 pounds since she has been evacuating her stool in last  couple of months. She is also on chronic narcotic medications as needed for pain  Follow-up visit 06/05/2017 Patient returned to clinic today because she felt like she is impacted again in her rectum and knows exactly where she is impacted. She said she is trying to push it out but unable to evacuate. She feels the stool is leaking around it. She is taking senna. She is not really taking MiraLAX. She has not tried an enema.   NSAIDs: none  Antiplts/Anticoagulants/Anti thrombotics: none  GI Procedures: Underwent colonoscopy in 08/2005 Diminutive polyp in the ascending colon, path showed tubular adenoma with no evidence of high-grade dysplasia EGD 08/2005 small hiatal hernia  Past Medical History:  Diagnosis Date  . Adenomatous polyp    x1  . ALLERGIC RHINITIS 12/20/2006  . Chronic anxiety   . Chronic back pain 04/14/2007   02/2017 [facility] records reviewed - mild lumbar DDD and L SIJ pain s/p ESI latest 10/2016 with good effect  . Diverticulitis   . GERD (gastroesophageal reflux disease)    intermittent, treated with tums  . Hiatal hernia   . Hypothyroidism   . Insomnia   . Osteoporosis 2007   declined prolia  . Perforation of right tympanic membrane 09/03/2015   Chronic, seen by Dr Janace Hoard ENT   . Personal history of kidney stones    x1  . Postmenopausal atrophic vaginitis 2016   rec estrace cream - [place]  . Recurrent UTI   . Scoliosis   . Severe recurrent depression with psychosis ([place])   . Urinary retention   . Vitamin D deficiency     Past Surgical History:  Procedure  Laterality Date  . ABDOMINAL HYSTERECTOMY  1979   ectopic pregnancy with one ovary removed  . APPENDECTOMY  1954  . CATARACT EXTRACTION, BILATERAL  11/2015  . CHOLECYSTECTOMY    . ESOPHAGOGASTRODUODENOSCOPY    . KNEE SURGERY Left 2012   L medial meniscal tear  . OOPHORECTOMY Right 1965   Ectopic pregnancy, removal of right ovary and tube- 1960  . ROTATOR CUFF REPAIR Right   . TONSILLECTOMY        Current Outpatient Medications:  .  bisacodyl (DULCOLAX) 10 MG suppository, Place 1 suppository (10 mg total) rectally daily as needed for moderate constipation., Disp: 12 suppository, Rfl: 0 .  bismuth subsalicylate (PEPTO-BISMOL) 262 MG chewable tablet, 524 mg ORALLY every 0.5 to 1 hour as needed, MAX: 8 doses in 24 hours (4192 mg/day); do not use for more than 2 days, Disp: 30 tablet, Rfl: 0 .  Calcium Carbonate-Vitamin D (CALCIUM-D PO), Take 1 tablet by mouth daily., Disp: , Rfl:  .  clonazePAM (KLONOPIN) 1 MG tablet, Take 1 tablet (1 mg total) by mouth at bedtime., Disp: 30 tablet, Rfl: 2 .  dicyclomine (BENTYL) 10 MG capsule, Take 1 capsule (10 mg total) by mouth daily as needed for spasms., Disp: 90 capsule, Rfl: 1 .  HYDROcodone-acetaminophen (NORCO/VICODIN) 5-325 MG tablet, Take 1 tablet by mouth every 6 (six) hours as needed for moderate pain., Disp: , Rfl:  .  lamoTRIgine (LAMICTAL) 25 MG tablet, Take 2 tablets (50 mg total) by mouth 2 (two) times daily., Disp: 360 tablet, Rfl: 2 .  levothyroxine (SYNTHROID, LEVOTHROID) 88 MCG tablet, 1 tablet before breakfast, 6 days a week (Patient taking differently: Take 88 mcg by mouth daily before breakfast. ), Disp: 90 tablet, Rfl: 1 .  loperamide (IMODIUM) 2 MG capsule, Take 1 capsule (2 mg total) by mouth 4 (four) times daily as needed for diarrhea or loose stools., Disp: 12 capsule, Rfl: 0 .  OLANZapine (ZYPREXA) 5 MG tablet, , Disp: , Rfl:  .  ondansetron (ZOFRAN) 4 MG tablet, Take 1 tablet (4 mg total) by mouth every 8 (eight) hours as needed for nausea or vomiting., Disp: 20 tablet, Rfl: 0 .  polyethylene glycol powder (GLYCOLAX/MIRALAX) powder, Take 8.5 g by mouth daily as needed for moderate constipation., Disp: 3350 g, Rfl: 1 .  polyethylene glycol powder (GLYCOLAX/MIRALAX) powder, polyethylene glycol 3350 17 gram oral powder packet  TK 17 GMS D, Disp: , Rfl:  .  PROAIR HFA 108 (90 Base) MCG/ACT inhaler, USE 2 INHALATIONS EVERY 6  HOURS AS NEEDED FOR COUGH, Disp: 25.5 g, Rfl: 1 .  senna-docusate (SENOKOT-S) 8.6-50 MG tablet, Take 2 tablets by mouth daily as needed for mild constipation., Disp: 30 tablet, Rfl: 1 .  venlafaxine (EFFEXOR) 37.5 MG tablet, Take 1 tablet (37.5 mg total) by mouth 2 (two) times daily., Disp: 180 tablet, Rfl: 2   Family History  Problem Relation Age of Onset  . Anxiety disorder Grandchild   . Depression Grandchild   . Cancer Sister        lung  . Healthy Sister   . Diabetes Mother   . Diabetes Father   . Schizophrenia Sister   . Diabetes Sister   . Diabetes Brother   . Cancer Sister        kidney  . Alcohol abuse Neg Hx   . Bipolar disorder Neg Hx   . Dementia Neg Hx   . Drug abuse Neg Hx   . OCD Neg Hx   .  Paranoid behavior Neg Hx   . Seizures Neg Hx   . Sexual abuse Neg Hx   . Physical abuse Neg Hx      Social History   Tobacco Use  . Smoking status: Never Smoker  . Smokeless tobacco: Never Used  Substance Use Topics  . Alcohol use: No  . Drug use: No    Allergies as of 06/05/2017 - Review Complete 06/05/2017  Allergen Reaction Noted  . Amoxicillin Hives 11/05/2012  . Buprenorphine hcl Itching 02/23/2013  . Morphine Itching 02/23/2013  . Morphine and related Itching 02/23/2013  . Sulfa antibiotics Hives 11/03/2006  . Sulfonamide derivatives Hives 11/03/2006  . Valium [diazepam] Itching 02/23/2013    Review of Systems:    All systems reviewed and negative except where noted in HPI.   Physical Exam:  BP 104/66 (BP Location: Left Arm, Patient Position: Sitting, Cuff Size: Normal)   Pulse (!) 103   Ht 5\' 5"  (1.651 m)   Wt 154 lb (69.9 kg)   LMP 07/15/2012   BMI 25.63 kg/m  Patient's last menstrual period was 07/15/2012.  General:   Alert,  Well-developed, well-nourished, pleasant and cooperative in NAD, uncomfortable sitting down Head:  Normocephalic and atraumatic. Eyes:  Sclera clear, no icterus.   Conjunctiva pink. Ears:  Normal auditory  acuity. Nose:  No deformity, discharge, or lesions. Mouth:  No deformity or lesions,oropharynx pink & moist. Neck:  Supple; no masses or thyromegaly. Lungs:  Respirations even and unlabored.  Clear throughout to auscultation.   No wheezes, crackles, or rhonchi. No acute distress. Heart:  Regular rate and rhythm; no murmurs, clicks, rubs, or gallops. Abdomen:  Normal bowel sounds. Soft, mild lower abdominal tenderness and non-distended without masses, hepatosplenomegaly or hernias noted.  No guarding or rebound tenderness.   Rectal: she had soft brown stool on diaper, rectal exam revealed large amount of solid stool in the rectum and I manually disimpacted, softened the stool. Subsequently, patient tried to have a BM but she could not empty Msk:  Symmetrical without gross deformities. Good, equal movement & strength bilaterally. Pulses:  Normal pulses noted. Extremities:  No clubbing or edema.  No cyanosis. Neurologic:  Alert and oriented x3;  grossly normal neurologically. Skin:  Intact without significant lesions or rashes. No jaundice. Psych:  Alert and cooperative. Normal mood and affect.  Imaging Studies: reviewed  Assessment and Plan:   Jailene Cupit is a 84 y.o. Caucasian female with history of hypothyroidism on Synthroid, chronic back pain on chronic opioid use, chronic constipation, fecal impaction since 03/2017 seen in follow up for recurrence of fecal impaction and overflow incontinence. Solid ball of stool was felt in the rectum again and I tried to manually disimpact. Not successful today. She has severe chronic opioid-induced constipation  - start methylnaltrexone once daily for 5 days - stay on a liquid diet - take MiraLAX daily - Try mineral oil or a fleet enema  History of tubular adenoma of colon Recommend colonoscopy, will discuss with patient about it after treating her constipation   Follow up in 2 weeks   Jamie Darby, MD

## 2017-06-05 NOTE — Progress Notes (Signed)
Patient states that when she left the last time she thought she was ok.  When she went home she realized that she was still impacted.  She can feel exactly where the impaction is.  Her hemorrhoids are causing severe pain.  LVM for patient concerning medication sent to pharmacy.

## 2017-06-09 ENCOUNTER — Telehealth: Payer: Self-pay | Admitting: Gastroenterology

## 2017-06-09 ENCOUNTER — Ambulatory Visit: Payer: Self-pay | Admitting: Physician Assistant

## 2017-06-09 NOTE — Telephone Encounter (Signed)
Pt is calling to let Dr. Marius Ditch know she is having another impaction and when she can come in for that

## 2017-06-09 NOTE — Telephone Encounter (Signed)
Please advise.  Thanks Tyisha Cressy

## 2017-06-10 NOTE — Telephone Encounter (Signed)
Contacted patient she said she has made an appt to see you tomorrow.

## 2017-06-11 ENCOUNTER — Encounter: Payer: Self-pay | Admitting: Gastroenterology

## 2017-06-11 ENCOUNTER — Ambulatory Visit (INDEPENDENT_AMBULATORY_CARE_PROVIDER_SITE_OTHER): Payer: Medicare Other | Admitting: Gastroenterology

## 2017-06-11 ENCOUNTER — Ambulatory Visit: Payer: Medicare Other | Admitting: Gastroenterology

## 2017-06-11 ENCOUNTER — Ambulatory Visit: Payer: Medicare Other | Admitting: Family Medicine

## 2017-06-11 VITALS — BP 107/70 | HR 101 | Ht 65.0 in | Wt 155.4 lb

## 2017-06-11 DIAGNOSIS — K5641 Fecal impaction: Secondary | ICD-10-CM | POA: Diagnosis not present

## 2017-06-11 NOTE — Progress Notes (Signed)
Jamie Darby, MD [address]  [place]  [place], [place] [address]  Main: [phone]  Fax: [phone]    Gastroenterology Consultation  Referring Provider:     Ria Bush, MD Primary Care Physician:  Jamie Bush, MD Primary Gastroenterologist:  Dr. Cephas Pitts Reason for Consultation:     Fecal impaction        HPI:   Jamie Pitts is a 84 y.o. female referred by Dr. Ria Bush, MD  for consultation & management of fecal impaction and chronic constipation. She reports that she has suffered from constipation almost all her life. She had worsening of constipation and reported fecal impaction with severe rectal pain that started in early February, 2019. She had a CT at that time which confirmed fecal impaction in the rectum with mild inflammation. Was given enemas and discharged home. She presented to the ER exactly a month later with overflow diarrhea and significant gas. She was given Imodium and Pepto-Bismol from the ER. She presented again on 05/18/2017 secondary to unable to urinate. She had a repeat CT which demonstrated fecal impaction in the rectum associated with stercoral colitis which was confirmed on clinical exam as well. She was admitted to the hospital at that time. she received GoLYTELY and Fleet enemas in the hospital. Abdominal x-ray revealed persistent fecal impaction in the rectum. She was discharged home on MiraLAX daily. She seen by Dr. Danise Pitts on 05/27/2017 as hospital follow-up, her symptoms persisted which were predominantly significant rectal discomfort, stool leakage, incontinence, decreased urination. She had an abdominal x-ray which revealed increasing stool burden. Therefore, she is referred to GI for further management.  Patient is currently taking MiraLAX daily. She reports lower abdominal discomfort, rectal pressure. Her stools are mushy, nonbloody. She lost about 20-30 pounds since she has been evacuating her stool in last  couple of months. She is also on chronic narcotic medications as needed for pain  Follow-up visit 06/05/2017 Patient returned to clinic today because she felt like she is impacted again in her rectum and knows exactly where she is impacted. She said she is trying to push it out but unable to evacuate. She feels the stool is leaking around it. She is taking senna. She is not really taking MiraLAX. She has not tried an enema.   Follow-up visit 06/11/2017 She returns today again with fecal impaction. She reports that she had a very good bowel movement yesterday but feels like there is hard stool sitting in her rectum and not able to evacuate, stool leaking around it. She is taking MiraLAX once a day. She did not fill in methylnaltrexone that I prescribed during last visit. She is not able to take enemas due to her spinal issues. She denies abdominal pain, nausea, vomiting.  NSAIDs: none  Antiplts/Anticoagulants/Anti thrombotics: none  GI Procedures: Underwent colonoscopy in 08/2005 Diminutive polyp in the ascending colon, path showed tubular adenoma with no evidence of high-grade dysplasia EGD 08/2005 small hiatal hernia  Past Medical History:  Diagnosis Date  . Adenomatous polyp    x1  . ALLERGIC RHINITIS 12/20/2006  . Chronic anxiety   . Chronic back pain 04/14/2007   02/2017 [facility] records reviewed - mild lumbar DDD and L SIJ pain s/p ESI latest 10/2016 with good effect  . Diverticulitis   . GERD (gastroesophageal reflux disease)    intermittent, treated with tums  . Hiatal hernia   . Hypothyroidism   . Insomnia   . Osteoporosis 2007   declined  prolia  . Perforation of right tympanic membrane 09/03/2015   Chronic, seen by Dr Janace Hoard ENT   . Personal history of kidney stones    x1  . Postmenopausal atrophic vaginitis 2016   rec estrace cream - [place]  . Recurrent UTI   . Scoliosis   . Severe recurrent depression with psychosis ([place])   . Urinary retention   . Vitamin D deficiency       Past Surgical History:  Procedure Laterality Date  . ABDOMINAL HYSTERECTOMY  1979   ectopic pregnancy with one ovary removed  . APPENDECTOMY  1954  . CATARACT EXTRACTION, BILATERAL  11/2015  . CHOLECYSTECTOMY    . ESOPHAGOGASTRODUODENOSCOPY    . KNEE SURGERY Left 2012   L medial meniscal tear  . OOPHORECTOMY Right 1965   Ectopic pregnancy, removal of right ovary and tube- 1960  . ROTATOR CUFF REPAIR Right   . TONSILLECTOMY       Current Outpatient Medications:  .  bisacodyl (DULCOLAX) 10 MG suppository, Place 1 suppository (10 mg total) rectally daily as needed for moderate constipation., Disp: 12 suppository, Rfl: 0 .  Calcium Carbonate-Vitamin D (CALCIUM-D PO), Take 1 tablet by mouth daily., Disp: , Rfl:  .  clonazePAM (KLONOPIN) 1 MG tablet, Take 1 tablet (1 mg total) by mouth at bedtime., Disp: 30 tablet, Rfl: 2 .  dicyclomine (BENTYL) 10 MG capsule, Take 1 capsule (10 mg total) by mouth daily as needed for spasms., Disp: 90 capsule, Rfl: 1 .  HYDROcodone-acetaminophen (NORCO/VICODIN) 5-325 MG tablet, Take 1 tablet by mouth every 6 (six) hours as needed for moderate pain., Disp: , Rfl:  .  lamoTRIgine (LAMICTAL) 25 MG tablet, Take 2 tablets (50 mg total) by mouth 2 (two) times daily., Disp: 360 tablet, Rfl: 2 .  levothyroxine (SYNTHROID, LEVOTHROID) 88 MCG tablet, 1 tablet before breakfast, 6 days a week (Patient taking differently: Take 88 mcg by mouth daily before breakfast. ), Disp: 90 tablet, Rfl: 1 .  OLANZapine (ZYPREXA) 5 MG tablet, , Disp: , Rfl:  .  ondansetron (ZOFRAN) 4 MG tablet, Take 1 tablet (4 mg total) by mouth every 8 (eight) hours as needed for nausea or vomiting., Disp: 20 tablet, Rfl: 0 .  polyethylene glycol powder (GLYCOLAX/MIRALAX) powder, polyethylene glycol 3350 17 gram oral powder packet  TK 17 GMS D, Disp: , Rfl:  .  PROAIR HFA 108 (90 Base) MCG/ACT inhaler, USE 2 INHALATIONS EVERY 6 HOURS AS NEEDED FOR COUGH, Disp: 25.5 g, Rfl: 1 .  senna-docusate  (SENOKOT-S) 8.6-50 MG tablet, Take 2 tablets by mouth daily as needed for mild constipation., Disp: 30 tablet, Rfl: 1 .  venlafaxine (EFFEXOR) 37.5 MG tablet, Take 1 tablet (37.5 mg total) by mouth 2 (two) times daily., Disp: 180 tablet, Rfl: 2   Family History  Problem Relation Age of Onset  . Anxiety disorder Grandchild   . Depression Grandchild   . Cancer Sister        lung  . Healthy Sister   . Diabetes Mother   . Diabetes Father   . Schizophrenia Sister   . Diabetes Sister   . Diabetes Brother   . Cancer Sister        kidney  . Alcohol abuse Neg Hx   . Bipolar disorder Neg Hx   . Dementia Neg Hx   . Drug abuse Neg Hx   . OCD Neg Hx   . Paranoid behavior Neg Hx   . Seizures Neg Hx   . Sexual  abuse Neg Hx   . Physical abuse Neg Hx      Social History   Tobacco Use  . Smoking status: Never Smoker  . Smokeless tobacco: Never Used  Substance Use Topics  . Alcohol use: No  . Drug use: No    Allergies as of 06/11/2017 - Review Complete 06/11/2017  Allergen Reaction Noted  . Amoxicillin Hives 11/05/2012  . Buprenorphine hcl Itching 02/23/2013  . Morphine Itching 02/23/2013  . Morphine and related Itching 02/23/2013  . Sulfa antibiotics Hives 11/03/2006  . Sulfonamide derivatives Hives 11/03/2006  . Valium [diazepam] Itching 02/23/2013    Review of Systems:    All systems reviewed and negative except where noted in HPI.   Physical Exam:  BP 107/70   Pulse (!) 101   Ht 5\' 5"  (1.651 m)   Wt 155 lb 6.4 oz (70.5 kg)   LMP 07/15/2012   BMI 25.86 kg/m  Patient's last menstrual period was 07/15/2012.  General:   Alert,  Well-developed, well-nourished, pleasant and cooperative in NAD, uncomfortable sitting down Head:  Normocephalic and atraumatic. Eyes:  Sclera clear, no icterus.   Conjunctiva pink. Ears:  Normal auditory acuity. Nose:  No deformity, discharge, or lesions. Mouth:  No deformity or lesions,oropharynx pink & moist. Neck:  Supple; no masses or  thyromegaly. Lungs:  Respirations even and unlabored.  Clear throughout to auscultation.   No wheezes, crackles, or rhonchi. No acute distress. Heart:  Regular rate and rhythm; no murmurs, clicks, rubs, or gallops. Abdomen:  Normal bowel sounds. Soft, mild lower abdominal tenderness and non-distended without masses, hepatosplenomegaly or hernias noted.  No guarding or rebound tenderness.   Rectal: rectal exam revealed large amount of very hard solid stool in the rectum and I could not evacuate the stool, but tried to break it down. Subsequently, patient tried to have a BM but she could not empty  Msk:  Symmetrical without gross deformities. Good, equal movement & strength bilaterally. Pulses:  Normal pulses noted. Extremities:  No clubbing or edema.  No cyanosis. Neurologic:  Alert and oriented x3;  grossly normal neurologically. Skin:  Intact without significant lesions or rashes. No jaundice. Psych:  Alert and cooperative. Normal mood and affect.  Imaging Studies: reviewed  Assessment and Plan:   Jamie Pitts is a 84 y.o. Caucasian female with history of hypothyroidism on Synthroid, chronic back pain on chronic opioid use, chronic constipation, fecal impaction since 03/2017 seen in follow up for recurrence of fecal impaction and overflow incontinence. Solid ball of stool was felt in the rectum again and I tried to manually disimpact, not successful today. She has severe chronic opioid-induced constipation  - start methylnaltrexone once daily for 5 days - take MiraLAX 2-3 times daily - if this does not work, we will try MiraLAX prep  History of tubular adenoma of colon Recommend colonoscopy, will discuss with patient about it after treating her constipation   Follow up in 2 weeks   Jamie Darby, MD

## 2017-06-16 ENCOUNTER — Ambulatory Visit (INDEPENDENT_AMBULATORY_CARE_PROVIDER_SITE_OTHER): Payer: Medicare Other | Admitting: Psychiatry

## 2017-06-16 ENCOUNTER — Encounter (HOSPITAL_COMMUNITY): Payer: Self-pay | Admitting: Psychiatry

## 2017-06-16 DIAGNOSIS — Z818 Family history of other mental and behavioral disorders: Secondary | ICD-10-CM

## 2017-06-16 DIAGNOSIS — F419 Anxiety disorder, unspecified: Secondary | ICD-10-CM

## 2017-06-16 DIAGNOSIS — F341 Dysthymic disorder: Secondary | ICD-10-CM

## 2017-06-16 DIAGNOSIS — R450 Nervousness: Secondary | ICD-10-CM

## 2017-06-16 MED ORDER — VENLAFAXINE HCL 37.5 MG PO TABS: 37.5000 mg | ORAL_TABLET | Freq: Two times a day (BID) | ORAL | 2 refills | Status: DC

## 2017-06-16 MED ORDER — LAMOTRIGINE 25 MG PO TABS: 50.0000 mg | ORAL_TABLET | Freq: Two times a day (BID) | ORAL | 2 refills | Status: DC

## 2017-06-16 MED ORDER — CLONAZEPAM 1 MG PO TABS: 1.0000 mg | ORAL_TABLET | Freq: Every day | ORAL | 2 refills | Status: DC

## 2017-06-16 MED ORDER — QUETIAPINE FUMARATE 25 MG PO TABS: 25.0000 mg | ORAL_TABLET | Freq: Two times a day (BID) | ORAL | 2 refills | Status: DC

## 2017-06-16 NOTE — Progress Notes (Signed)
BH MD/PA/NP OP Progress Note  06/16/2017 2:58 PM Jamie Pitts  MRN:  [id number]  Chief Complaint:  Chief Complaint    Depression; Anxiety; Hallucinations; Follow-up     HPI: This patient is a 84 year old married white female who lives with her husband in [place]. She has one daughter and 3 grandchildren. She is retired from the [employer].  Apparently the patient had some sort of psychotic episode in 2013. She was admitted to [facility] because she was hearing voices at home. The voices got so bad that she cannot put a gun under the bed and wanted to shoot him. The patient is a poor historian and is hard of hearing and gets easily confused. She was placed on various medicines in the hospital which have been continued. It's unclear if she is medication compliant. For example she's not taking the Cogentin. She repeats herself a lot.  The patient states she still hearing voices every day. They're not frightening her like they were before but there seemed to her and she can't always get them to stop. She sleeping pretty well she denies being depressed but she is obviously not thinking clearly. She mentions that she's been diagnosed with parkinsonism but she doesn't have a tremor.  The patient returns for follow-up after 3 months.  Since I last saw her she was hospitalized for fecal impaction.  She has lost about 30 pounds.  She is now doing better and starting to eat more normally and having more regular bowel movements.  She states however she never got the Seroquel that I sent in and she is still hearing voices telling her what to do and quit taking her.  She does not think they are frightening and finds a more annoying.  She denies any command hallucinations or thoughts of self-harm or suicide.  She claims she is depressed but appears very nicely made up and pleasant and interactive.  She states that she is not sleeping well because of the voices and often takes it until 2 in the  morning to get to sleep Visit Diagnosis:    ICD-10-CM   1. ANXIETY DEPRESSION F34.1 lamoTRIgine (LAMICTAL) 25 MG tablet    DISCONTINUED: lamoTRIgine (LAMICTAL) 25 MG tablet    Past Psychiatric History: Hospitalization in 2013 for auditory hallucinations and depression  Past Medical History:  Past Medical History:  Diagnosis Date  . Adenomatous polyp    x1  . ALLERGIC RHINITIS 12/20/2006  . Chronic anxiety   . Chronic back pain 04/14/2007   02/2017 [facility] records reviewed - mild lumbar DDD and L SIJ pain s/p ESI latest 10/2016 with good effect  . Diverticulitis   . GERD (gastroesophageal reflux disease)    intermittent, treated with tums  . Hiatal hernia   . Hypothyroidism   . Insomnia   . Osteoporosis 2007   declined prolia  . Perforation of right tympanic membrane 09/03/2015   Chronic, seen by Dr Janace Hoard ENT   . Personal history of kidney stones    x1  . Postmenopausal atrophic vaginitis 2016   rec estrace cream - [place]  . Recurrent UTI   . Scoliosis   . Severe recurrent depression with psychosis ([place])   . Urinary retention   . Vitamin D deficiency     Past Surgical History:  Procedure Laterality Date  . ABDOMINAL HYSTERECTOMY  1979   ectopic pregnancy with one ovary removed  . APPENDECTOMY  1954  . CATARACT EXTRACTION, BILATERAL  11/2015  . CHOLECYSTECTOMY    .  ESOPHAGOGASTRODUODENOSCOPY    . KNEE SURGERY Left 2012   L medial meniscal tear  . OOPHORECTOMY Right 1965   Ectopic pregnancy, removal of right ovary and tube- 1960  . ROTATOR CUFF REPAIR Right   . TONSILLECTOMY      Family Psychiatric History: See below  Family History:  Family History  Problem Relation Age of Onset  . Anxiety disorder Grandchild   . Depression Grandchild   . Cancer Sister        lung  . Healthy Sister   . Diabetes Mother   . Diabetes Father   . Schizophrenia Sister   . Diabetes Sister   . Diabetes Brother   . Cancer Sister        kidney  . Alcohol abuse Neg Hx   .  Bipolar disorder Neg Hx   . Dementia Neg Hx   . Drug abuse Neg Hx   . OCD Neg Hx   . Paranoid behavior Neg Hx   . Seizures Neg Hx   . Sexual abuse Neg Hx   . Physical abuse Neg Hx     Social History:  Social History   Socioeconomic History  . Marital status: Married    Spouse name: Not on file  . Number of children: 1  . Years of education: Not on file  . Highest education level: Not on file  Occupational History  . Occupation: retired Market researcher  Social Needs  . Financial resource strain: Patient refused  . Food insecurity:    Worry: Patient refused    Inability: Patient refused  . Transportation needs:    Medical: Patient refused    Non-medical: Patient refused  Tobacco Use  . Smoking status: Never Smoker  . Smokeless tobacco: Never Used  Substance and Sexual Activity  . Alcohol use: No  . Drug use: No  . Sexual activity: Not Currently    Birth control/protection: Diaphragm  Lifestyle  . Physical activity:    Days per week: Patient refused    Minutes per session: Patient refused  . Stress: Patient refused  Relationships  . Social connections:    Talks on phone: Patient refused    Gets together: Patient refused    Attends religious service: Patient refused    Active member of club or organization: Patient refused    Attends meetings of clubs or organizations: Patient refused    Relationship status: Patient refused  Other Topics Concern  . Not on file  Social History Narrative   Lives with husband and grandson [name])   Occ: retired, Sales executive   Edu: College          Allergies:  Allergies  Allergen Reactions  . Amoxicillin Hives    Sores in mouth  . Buprenorphine Hcl Itching    Can tolerate if necessary Can tolerate if necessary Can tolerate if necessary  . Morphine Itching    Can tolerate if necessary  . Morphine And Related Itching    Can tolerate if necessary  . Sulfa Antibiotics Hives    Sores in mouth Sores in mouth   . Sulfonamide  Derivatives Hives    Sores in mouth  . Valium [Diazepam] Itching    Can tolerate if necessary    Metabolic Disorder Labs: No results found for: HGBA1C, MPG No results found for: PROLACTIN Lab Results  Component Value Date   CHOL 208 (H) 11/18/2016   TRIG 248.0 (H) 11/18/2016   HDL 43.10 11/18/2016   CHOLHDL 5 11/18/2016   VLDL  49.6 (H) 11/18/2016   Lab Results  Component Value Date   TSH 0.147 (L) 05/18/2017   TSH 0.64 11/13/2016    Therapeutic Level Labs: No results found for: LITHIUM No results found for: VALPROATE No components found for:  CBMZ  Current Medications: Current Outpatient Medications  Medication Sig Dispense Refill  . bisacodyl (DULCOLAX) 10 MG suppository Place 1 suppository (10 mg total) rectally daily as needed for moderate constipation. 12 suppository 0  . Calcium Carbonate-Vitamin D (CALCIUM-D PO) Take 1 tablet by mouth daily.    . clonazePAM (KLONOPIN) 1 MG tablet Take 1 tablet (1 mg total) by mouth at bedtime. 30 tablet 2  . dicyclomine (BENTYL) 10 MG capsule Take 1 capsule (10 mg total) by mouth daily as needed for spasms. 90 capsule 1  . HYDROcodone-acetaminophen (NORCO/VICODIN) 5-325 MG tablet Take 1 tablet by mouth every 6 (six) hours as needed for moderate pain.    Marland Kitchen lamoTRIgine (LAMICTAL) 25 MG tablet Take 2 tablets (50 mg total) by mouth 2 (two) times daily. 360 tablet 2  . levothyroxine (SYNTHROID, LEVOTHROID) 88 MCG tablet 1 tablet before breakfast, 6 days a week (Patient taking differently: Take 88 mcg by mouth daily before breakfast. ) 90 tablet 1  . ondansetron (ZOFRAN) 4 MG tablet Take 1 tablet (4 mg total) by mouth every 8 (eight) hours as needed for nausea or vomiting. 20 tablet 0  . polyethylene glycol powder (GLYCOLAX/MIRALAX) powder polyethylene glycol 3350 17 gram oral powder packet  TK 17 GMS D    . PROAIR HFA 108 (90 Base) MCG/ACT inhaler USE 2 INHALATIONS EVERY 6 HOURS AS NEEDED FOR COUGH 25.5 g 1  . senna-docusate (SENOKOT-S)  8.6-50 MG tablet Take 2 tablets by mouth daily as needed for mild constipation. 30 tablet 1  . venlafaxine (EFFEXOR) 37.5 MG tablet Take 1 tablet (37.5 mg total) by mouth 2 (two) times daily. 180 tablet 2  . QUEtiapine (SEROQUEL) 25 MG tablet Take 1 tablet (25 mg total) by mouth 2 (two) times daily. 60 tablet 2   No current facility-administered medications for this visit.      Musculoskeletal: Strength & Muscle Tone: within normal limits Gait & Station: normal Patient leans: N/A  Psychiatric Specialty Exam: Review of Systems  Gastrointestinal: Positive for constipation.  Psychiatric/Behavioral: Positive for hallucinations. The patient is nervous/anxious.   All other systems reviewed and are negative.   Blood pressure 131/83, pulse 87, height 5\' 5"  (1.651 m), weight 153 lb (69.4 kg), last menstrual period 07/15/2012, SpO2 93 %.Body mass index is 25.46 kg/m.  General Appearance: Casual, Neat and Well Groomed  Eye Contact:  Good  Speech:  Clear and Coherent  Volume:  Normal  Mood:  Anxious  Affect:  Constricted and Flat  Thought Process:  Goal Directed  Orientation:  Full (Time, Place, and Person)  Thought Content: Hallucinations: Auditory   Suicidal Thoughts:  No  Homicidal Thoughts:  No  Memory:  Immediate;   Good Recent;   Fair Remote;   Poor  Judgement:  Poor  Insight:  Lacking  Psychomotor Activity:  Decreased  Concentration:  Concentration: Fair and Attention Span: Fair  Recall:  [employer] of Knowledge: Fair  Language: Good  Akathisia:  No  Handed:  Right  AIMS (if indicated): not done  Assets:  Communication Skills Desire for Improvement Social Support Talents/Skills Transportation  ADL's:  Intact  Cognition: WNL  Sleep:  Poor   Screenings: Mini-Mental     Clinical Support from 11/18/2016 in  Therapist, music at [facility]  Total Score (max 30 points )  20    PHQ2-9     Clinical Support from 11/18/2016 in [place] at [facility]  Total Score  1  PHQ-9 Total Score  2       Assessment and Plan: This patient is an 84 year old female with a history of psychotic depression.  It is concerning that she is now having more auditory hallucinations.  Last time was tried to start Seroquel but this never got initiated.  She will start Seroquel 25 mg twice a day in fact she can take both pills at night to begin with to help with sleep as well.  She will continue Effexor 37.5 mg twice daily for depression, Lamictal to 50 mg twice daily for mood stabilization, clonazepam 1 mg at bedtime for anxiety.  She will return to see me in 4 weeks or call sooner if needed   Levonne Spiller, MD 06/16/2017, 2:58 PM

## 2017-06-17 ENCOUNTER — Emergency Department: Payer: Medicare Other

## 2017-06-17 ENCOUNTER — Other Ambulatory Visit: Payer: Self-pay

## 2017-06-17 ENCOUNTER — Emergency Department
Admission: EM | Admit: 2017-06-17 | Discharge: 2017-06-17 | Disposition: A | Discharge status: Home / Self Care | Payer: Medicare Other | Attending: Emergency Medicine | Admitting: Emergency Medicine

## 2017-06-17 ENCOUNTER — Encounter: Payer: Self-pay | Admitting: Emergency Medicine

## 2017-06-17 DIAGNOSIS — K5641 Fecal impaction: Secondary | ICD-10-CM | POA: Diagnosis not present

## 2017-06-17 DIAGNOSIS — E039 Hypothyroidism, unspecified: Secondary | ICD-10-CM | POA: Diagnosis not present

## 2017-06-17 DIAGNOSIS — R000 Tachycardia, unspecified: Secondary | ICD-10-CM | POA: Diagnosis not present

## 2017-06-17 DIAGNOSIS — J449 Chronic obstructive pulmonary disease, unspecified: Secondary | ICD-10-CM | POA: Insufficient documentation

## 2017-06-17 DIAGNOSIS — Z79899 Other long term (current) drug therapy: Secondary | ICD-10-CM | POA: Insufficient documentation

## 2017-06-17 DIAGNOSIS — K5900 Constipation, unspecified: Secondary | ICD-10-CM | POA: Diagnosis not present

## 2017-06-17 MED ORDER — MAGNESIUM CITRATE PO SOLN
0.5000 | Freq: Once | ORAL | Status: AC
  Administered 2017-06-17: 0.5
  Filled 2017-06-17: qty 296

## 2017-06-17 MED ORDER — DOCUSATE SODIUM 50 MG/5ML PO LIQD
100.0000 mg | Freq: Once | ORAL | Status: AC
  Administered 2017-06-17: 100 mg
  Filled 2017-06-17 (×2): qty 10

## 2017-06-17 MED ORDER — LACTULOSE 10 GM/15ML PO SOLN
10.0000 g | Freq: Once | ORAL | Status: AC
  Administered 2017-06-17: 10 g via ORAL
  Filled 2017-06-17: qty 30

## 2017-06-17 NOTE — ED Provider Notes (Signed)
[facility] Emergency Department Provider Note  ___________________________________________   First MD Initiated Contact with Patient 06/17/17 [phone]     (approximate)  I have reviewed the triage vital signs and the nursing notes.   HISTORY  Chief Complaint Fecal Impaction   HPI Jamie Pitts is a 84 y.o. female with a history of diverticulitis as well as constipation was presenting with suspected fecal impaction.  She said that she had a normal bowel movement 2 days ago but now is feeling fullness to the left lower quadrant and says that she is unable to move her bowels.  Denies any abdominal pain.  Does not report nausea, vomiting.  Says that she is taking oral stool softeners at home without relief.   Past Medical History:  Diagnosis Date  . Adenomatous polyp    x1  . ALLERGIC RHINITIS 12/20/2006  . Chronic anxiety   . Chronic back pain 04/14/2007   02/2017 [facility] records reviewed - mild lumbar DDD and L SIJ pain s/p ESI latest 10/2016 with good effect  . Diverticulitis   . GERD (gastroesophageal reflux disease)    intermittent, treated with tums  . Hiatal hernia   . Hypothyroidism   . Insomnia   . Osteoporosis 2007   declined prolia  . Perforation of right tympanic membrane 09/03/2015   Chronic, seen by Dr Janace Hoard ENT   . Personal history of kidney stones    x1  . Postmenopausal atrophic vaginitis 2016   rec estrace cream - [place]  . Recurrent UTI   . Scoliosis   . Severe recurrent depression with psychosis ([place])   . Urinary retention   . Vitamin D deficiency     Patient Active Problem List   Diagnosis Date Noted  . Malnutrition of moderate degree 05/20/2017  . Fecal impaction ([place]) 05/18/2017  . Abdominal pain 05/18/2017  . Long-term current use of opiate analgesic 03/06/2017  . Chronic low back pain 03/06/2017  . Chronic pain syndrome 03/06/2017  . Nausea 03/04/2017  . Abdominal cramping 05/24/2016  . Perforation of right tympanic  membrane 09/03/2015  . COPD (chronic obstructive pulmonary disease) ([place]) 12/07/2014  . Constipation 05/08/2014  . Urinary retention 05/08/2014  . Urinary urgency 04/05/2014  . Chronic cough 03/02/2013  . Dysphagia 11/05/2012  . Drug-induced Parkinsonism ([place]) 07/29/2012  . Insomnia secondary to depression with anxiety 05/22/2012  . Chronic back pain 04/14/2007  . Hypothyroidism 11/04/2006  . GERD 11/04/2006  . OSTEOPOROSIS 11/04/2006  . Depression, major, recurrent, severe with psychosis ([place]) 08/29/2006    Past Surgical History:  Procedure Laterality Date  . ABDOMINAL HYSTERECTOMY  1979   ectopic pregnancy with one ovary removed  . APPENDECTOMY  1954  . CATARACT EXTRACTION, BILATERAL  11/2015  . CHOLECYSTECTOMY    . ESOPHAGOGASTRODUODENOSCOPY    . KNEE SURGERY Left 2012   L medial meniscal tear  . OOPHORECTOMY Right 1965   Ectopic pregnancy, removal of right ovary and tube- 1960  . ROTATOR CUFF REPAIR Right   . TONSILLECTOMY      Prior to Admission medications   Medication Sig Start Date End Date Taking? Authorizing Provider  bisacodyl (DULCOLAX) 10 MG suppository Place 1 suppository (10 mg total) rectally daily as needed for moderate constipation. 05/09/14   Hongalgi, Lenis Dickinson, MD  Calcium Carbonate-Vitamin D (CALCIUM-D PO) Take 1 tablet by mouth daily.    [provider]  clonazePAM (KLONOPIN) 1 MG tablet Take 1 tablet (1 mg total) by mouth at bedtime. 06/16/17  06/16/18  Cloria Spring, MD  dicyclomine (BENTYL) 10 MG capsule Take 1 capsule (10 mg total) by mouth daily as needed for spasms. 05/24/16   Ria [facility], MD  HYDROcodone-acetaminophen (NORCO/VICODIN) 5-325 MG tablet Take 1 tablet by mouth every 6 (six) hours as needed for moderate pain. 05/24/16   Ria [facility], MD  lamoTRIgine (LAMICTAL) 25 MG tablet Take 2 tablets (50 mg total) by mouth 2 (two) times daily. 06/16/17   Cloria Spring, MD  levothyroxine (SYNTHROID, LEVOTHROID) 88 MCG tablet 1 tablet  before breakfast, 6 days a week Patient taking differently: Take 88 mcg by mouth daily before breakfast.  11/19/16   Elayne Snare, MD  ondansetron (ZOFRAN) 4 MG tablet Take 1 tablet (4 mg total) by mouth every 8 (eight) hours as needed for nausea or vomiting. 03/04/17   Ria [facility], MD  polyethylene glycol powder (GLYCOLAX/MIRALAX) powder polyethylene glycol 3350 17 gram oral powder packet  TK 17 GMS D    [provider]  PROAIR HFA 108 (90 Base) MCG/ACT inhaler USE 2 INHALATIONS EVERY 6 HOURS AS NEEDED FOR COUGH 12/19/15   Ria [facility], MD  QUEtiapine (SEROQUEL) 25 MG tablet Take 1 tablet (25 mg total) by mouth 2 (two) times daily. 06/16/17 06/16/18  Cloria Spring, MD  senna-docusate (SENOKOT-S) 8.6-50 MG tablet Take 2 tablets by mouth daily as needed for mild constipation. 05/20/17 05/20/18  Bettey Costa, MD  venlafaxine (EFFEXOR) 37.5 MG tablet Take 1 tablet (37.5 mg total) by mouth 2 (two) times daily. 06/16/17   Cloria Spring, MD    Allergies Amoxicillin; Buprenorphine hcl; Morphine; Morphine and related; Sulfa antibiotics; Sulfonamide derivatives; and Valium [diazepam]  Family History  Problem Relation Age of Onset  . Anxiety disorder Grandchild   . Depression Grandchild   . Cancer Sister        lung  . Healthy Sister   . Diabetes Mother   . Diabetes Father   . Schizophrenia Sister   . Diabetes Sister   . Diabetes Brother   . Cancer Sister        kidney  . Alcohol abuse Neg Hx   . Bipolar disorder Neg Hx   . Dementia Neg Hx   . Drug abuse Neg Hx   . OCD Neg Hx   . Paranoid behavior Neg Hx   . Seizures Neg Hx   . Sexual abuse Neg Hx   . Physical abuse Neg Hx     Social History Social History   Tobacco Use  . Smoking status: Never Smoker  . Smokeless tobacco: Never Used  Substance Use Topics  . Alcohol use: No  . Drug use: No    Review of Systems  Constitutional: No fever/chills Eyes: No visual changes. ENT: No sore throat. Cardiovascular:  Denies chest pain. Respiratory: Denies shortness of breath. Gastrointestinal: No abdominal pain.  No nausea, no vomiting.  No diarrhea.   Genitourinary: Negative for dysuria. Musculoskeletal: Negative for back pain. Skin: Negative for rash. Neurological: Negative for headaches, focal weakness or numbness.   ____________________________________________   PHYSICAL EXAM:  VITAL SIGNS: ED Triage Vitals  Enc Vitals Group     BP 06/17/17 1357 136/65     Pulse Rate 06/17/17 1357 92     Resp 06/17/17 1357 16     Temp 06/17/17 1357 98.9 F (37.2 C)     Temp Source 06/17/17 1357 Oral     SpO2 06/17/17 1357 95 %     Weight 06/17/17 1356 155 lb (  70.3 kg)     Height 06/17/17 1356 5\' 5"  (1.651 m)     Head Circumference --      Peak Flow --      Pain Score 06/17/17 1356 0     Pain Loc --      Pain Edu? --      Excl. in [place]? --     Constitutional: Alert and oriented. Well appearing and in no acute distress. Eyes: Conjunctivae are normal.  Head: Atraumatic. Nose: No congestion/rhinnorhea. Mouth/Throat: Mucous membranes are moist.  Neck: No stridor.   Cardiovascular: Normal rate, regular rhythm. Grossly normal heart sounds.   Respiratory: Normal respiratory effort.  No retractions. Lungs CTAB. Gastrointestinal: Soft and nontender. No distention.   Digital rectal exam with a large amount of hard stool which is high up in the rectal vault and I am unable to disimpact a large amount of this.  Musculoskeletal: No lower extremity tenderness nor edema.  No joint effusions. Neurologic:  Normal speech and language. No gross focal neurologic deficits are appreciated. Skin:  Skin is warm, dry and intact. No rash noted. Psychiatric: Mood and affect are normal. Speech and behavior are normal.  ____________________________________________   LABS (all labs ordered are listed, but only abnormal results are displayed)  Labs Reviewed - No data to  display ____________________________________________  EKG  ED ECG REPORT I, Doran Stabler, the attending physician, personally viewed and interpreted this ECG.   Date: 06/17/2017  EKG Time: 1732  Rate: 111  Rhythm: sinus tachycardia  Axis: Normal  Intervals:none  ST&T Change: No ST segment elevation or depression.  No abnormal T wave inversion.  ____________________________________________  RADIOLOGY   ____________________________________________   PROCEDURES  Procedure(s) performed:  ------------------------------------------------------------------------------------------------------------------- Fecal Disimpaction Procedure Note:  Performed by me:  Patient placed in the lateral recumbent position with knees drawn towards chest. Nurse present for patient support.  Only able to disimpact a small amount of hard stool high up in the vault.  We will proceed with enema.  ------------------------------------------------------------------------------------------------------------------   Procedures  Critical Care performed:   ____________________________________________   INITIAL IMPRESSION / ASSESSMENT AND PLAN / ED COURSE  Pertinent labs & imaging results that were available during my care of the patient were reviewed by me and considered in my medical decision making (see chart for details).  DDX: Bowel obstruction, constipation, fecal impaction, abdominal pain As part of my medical decision making, I reviewed the following data within the electronic MEDICAL RECORD NUMBER Notes from prior ED visits  ----------------------------------------- 5:36 PM on 06/17/2017 -----------------------------------------  Patient, after her enema, has had a large bowel movement and says that she feels much better and back to her baseline.  Tachycardic after her enema to the 120s.  Found to be sinus tachycardia on her EKG.  Heart rate also slowly decreasing and is now down to 109.   We will encourage p.o. fluids at home.  Patient understanding of the diagnosis as well as treatment plan.  She will also continue her p.o. stool softeners at home.  To be discharged at this time. ____________________________________________   FINAL CLINICAL IMPRESSION(S) / ED DIAGNOSES  Fecal impaction.    NEW MEDICATIONS STARTED DURING THIS VISIT:  New Prescriptions   No medications on file     Note:  This document was prepared using Dragon voice recognition software and may include unintentional dictation errors.     Orbie Pyo, MD 06/17/17 [phone]

## 2017-06-17 NOTE — ED Triage Notes (Signed)
Pt to ED via POV. Pt states that she is impacted.Pt states that she went and saw her PCP last week but she still feels like she is impacted. Pt is in NAD at this time.

## 2017-06-25 ENCOUNTER — Ambulatory Visit: Payer: Medicare Other | Admitting: Gastroenterology

## 2017-07-07 ENCOUNTER — Other Ambulatory Visit (HOSPITAL_COMMUNITY): Payer: Self-pay | Admitting: Psychiatry

## 2017-07-17 ENCOUNTER — Ambulatory Visit (INDEPENDENT_AMBULATORY_CARE_PROVIDER_SITE_OTHER): Payer: Medicare Other | Admitting: Psychiatry

## 2017-07-17 ENCOUNTER — Encounter (HOSPITAL_COMMUNITY): Payer: Self-pay | Admitting: Psychiatry

## 2017-07-17 VITALS — BP 125/73 | HR 73 | Ht 65.0 in | Wt 158.0 lb

## 2017-07-17 DIAGNOSIS — F29 Unspecified psychosis not due to a substance or known physiological condition: Secondary | ICD-10-CM

## 2017-07-17 DIAGNOSIS — F341 Dysthymic disorder: Secondary | ICD-10-CM | POA: Diagnosis not present

## 2017-07-17 MED ORDER — CLONAZEPAM 1 MG PO TABS: 1.0000 mg | ORAL_TABLET | Freq: Every day | ORAL | 2 refills | Status: DC

## 2017-07-17 MED ORDER — QUETIAPINE FUMARATE 25 MG PO TABS: ORAL_TABLET | ORAL | 2 refills | Status: DC

## 2017-07-17 NOTE — Progress Notes (Signed)
BH MD/PA/NP OP Progress Note  07/17/2017 2:22 PM Jamie Pitts  MRN:  [id number]  Chief Complaint:  Chief Complaint    Depression; Hallucinations; Anxiety; Follow-up     HPI: This patient is a 84 year old married white female who lives with her husband in [place]. She has one daughter and 3 grandchildren. She is retired from the [employer].  Apparently the patient had some sort of psychotic episode in 2013. She was admitted to [facility] because she was hearing voices at home. The voices got so bad that she cannot put a gun under the bed and wanted to shoot him. The patient is a poor historian and is hard of hearing and gets easily confused. She was placed on various medicines in the hospital which have been continued. It's unclear if she is medication compliant. For example she's not taking the Cogentin. She repeats herself a lot.  The patient states she still hearing voices every day. They're not frightening her like they were before but there seemed to her and she can't always get them to stop. She sleeping pretty well she denies being depressed but she is obviously not thinking clearly. She mentions that she's been diagnosed with parkinsonism but she doesn't have a tremor.  She returns for follow-up after 4 weeks.  Last time we initiated Seroquel 25 mg twice daily.  She states that her voices have diminished quite a bit.  She still hears some during the day but they do not really bother her that much.  She is now able to sleep but they still wake her up about once a night.  They are not stating anything dangerous for example they are not telling her to harm herself.  She would like to be on a little bit more medication at night so she can sleep throughout the whole night and I agree.  I explained that we can only increase the dosage so high.  She denies being depressed and she is very personable neatly dressed and pleasant today. Visit Diagnosis:    ICD-10-CM   1. ANXIETY  DEPRESSION F34.1   2. Psychosis, unspecified psychosis type ([place]) [place]     Past Psychiatric History: Hospitalization in 2013 for auditory hallucinations and depression  Past Medical History:  Past Medical History:  Diagnosis Date  . Adenomatous polyp    x1  . ALLERGIC RHINITIS 12/20/2006  . Chronic anxiety   . Chronic back pain 04/14/2007   02/2017 [facility] records reviewed - mild lumbar DDD and L SIJ pain s/p ESI latest 10/2016 with good effect  . Diverticulitis   . GERD (gastroesophageal reflux disease)    intermittent, treated with tums  . Hiatal hernia   . Hypothyroidism   . Insomnia   . Osteoporosis 2007   declined prolia  . Perforation of right tympanic membrane 09/03/2015   Chronic, seen by Dr Janace Hoard ENT   . Personal history of kidney stones    x1  . Postmenopausal atrophic vaginitis 2016   rec estrace cream - [place]  . Recurrent UTI   . Scoliosis   . Severe recurrent depression with psychosis ([place])   . Urinary retention   . Vitamin D deficiency     Past Surgical History:  Procedure Laterality Date  . ABDOMINAL HYSTERECTOMY  1979   ectopic pregnancy with one ovary removed  . APPENDECTOMY  1954  . CATARACT EXTRACTION, BILATERAL  11/2015  . CHOLECYSTECTOMY    . ESOPHAGOGASTRODUODENOSCOPY    . KNEE SURGERY Left 2012  L medial meniscal tear  . OOPHORECTOMY Right 1965   Ectopic pregnancy, removal of right ovary and tube- 1960  . ROTATOR CUFF REPAIR Right   . TONSILLECTOMY      Family Psychiatric History: See below  Family History:  Family History  Problem Relation Age of Onset  . Anxiety disorder Grandchild   . Depression Grandchild   . Cancer Sister        lung  . Healthy Sister   . Diabetes Mother   . Diabetes Father   . Schizophrenia Sister   . Diabetes Sister   . Diabetes Brother   . Cancer Sister        kidney  . Alcohol abuse Neg Hx   . Bipolar disorder Neg Hx   . Dementia Neg Hx   . Drug abuse Neg Hx   . OCD Neg Hx   . Paranoid behavior Neg  Hx   . Seizures Neg Hx   . Sexual abuse Neg Hx   . Physical abuse Neg Hx     Social History:  Social History   Socioeconomic History  . Marital status: Married    Spouse name: Not on file  . Number of children: 1  . Years of education: Not on file  . Highest education level: Not on file  Occupational History  . Occupation: retired Market researcher  Social Needs  . Financial resource strain: Patient refused  . Food insecurity:    Worry: Patient refused    Inability: Patient refused  . Transportation needs:    Medical: Patient refused    Non-medical: Patient refused  Tobacco Use  . Smoking status: Never Smoker  . Smokeless tobacco: Never Used  Substance and Sexual Activity  . Alcohol use: No  . Drug use: No  . Sexual activity: Not Currently    Birth control/protection: Diaphragm  Lifestyle  . Physical activity:    Days per week: Patient refused    Minutes per session: Patient refused  . Stress: Patient refused  Relationships  . Social connections:    Talks on phone: Patient refused    Gets together: Patient refused    Attends religious service: Patient refused    Active member of club or organization: Patient refused    Attends meetings of clubs or organizations: Patient refused    Relationship status: Patient refused  Other Topics Concern  . Not on file  Social History Narrative   Lives with husband and grandson [name])   Occ: retired, Sales executive   Edu: College          Allergies:  Allergies  Allergen Reactions  . Amoxicillin Hives    Sores in mouth  . Buprenorphine Hcl Itching    Can tolerate if necessary Can tolerate if necessary Can tolerate if necessary  . Morphine Itching    Can tolerate if necessary  . Morphine And Related Itching    Can tolerate if necessary  . Sulfa Antibiotics Hives    Sores in mouth Sores in mouth   . Sulfonamide Derivatives Hives    Sores in mouth  . Valium [Diazepam] Itching    Can tolerate if necessary    Metabolic  Disorder Labs: No results found for: HGBA1C, MPG No results found for: PROLACTIN Lab Results  Component Value Date   CHOL 208 (H) 11/18/2016   TRIG 248.0 (H) 11/18/2016   HDL 43.10 11/18/2016   CHOLHDL 5 11/18/2016   VLDL 49.6 (H) 11/18/2016   Lab Results  Component Value Date  TSH 0.147 (L) 05/18/2017   TSH 0.64 11/13/2016    Therapeutic Level Labs: No results found for: LITHIUM No results found for: VALPROATE No components found for:  CBMZ  Current Medications: Current Outpatient Medications  Medication Sig Dispense Refill  . bisacodyl (DULCOLAX) 10 MG suppository Place 1 suppository (10 mg total) rectally daily as needed for moderate constipation. 12 suppository 0  . Calcium Carbonate-Vitamin D (CALCIUM-D PO) Take 1 tablet by mouth daily.    . clonazePAM (KLONOPIN) 1 MG tablet Take 1 tablet (1 mg total) by mouth at bedtime. 30 tablet 2  . dicyclomine (BENTYL) 10 MG capsule Take 1 capsule (10 mg total) by mouth daily as needed for spasms. 90 capsule 1  . HYDROcodone-acetaminophen (NORCO/VICODIN) 5-325 MG tablet Take 1 tablet by mouth every 6 (six) hours as needed for moderate pain.    Marland Kitchen lamoTRIgine (LAMICTAL) 25 MG tablet Take 2 tablets (50 mg total) by mouth 2 (two) times daily. 360 tablet 2  . levothyroxine (SYNTHROID, LEVOTHROID) 88 MCG tablet 1 tablet before breakfast, 6 days a week (Patient taking differently: Take 88 mcg by mouth daily before breakfast. ) 90 tablet 1  . ondansetron (ZOFRAN) 4 MG tablet Take 1 tablet (4 mg total) by mouth every 8 (eight) hours as needed for nausea or vomiting. 20 tablet 0  . polyethylene glycol powder (GLYCOLAX/MIRALAX) powder polyethylene glycol 3350 17 gram oral powder packet  TK 17 GMS D    . PROAIR HFA 108 (90 Base) MCG/ACT inhaler USE 2 INHALATIONS EVERY 6 HOURS AS NEEDED FOR COUGH 25.5 g 1  . QUEtiapine (SEROQUEL) 25 MG tablet Take one in the am and two at bedtime 90 tablet 2  . senna-docusate (SENOKOT-S) 8.6-50 MG tablet Take 2  tablets by mouth daily as needed for mild constipation. 30 tablet 1  . venlafaxine (EFFEXOR) 37.5 MG tablet Take 1 tablet (37.5 mg total) by mouth 2 (two) times daily. 180 tablet 2   No current facility-administered medications for this visit.      Musculoskeletal: Strength & Muscle Tone: within normal limits Gait & Station: normal Patient leans: N/A  Psychiatric Specialty Exam: Review of Systems  Psychiatric/Behavioral: Positive for hallucinations.    Blood pressure 125/73, pulse 73, height 5\' 5"  (1.651 m), weight 158 lb (71.7 kg), last menstrual period 07/15/2012, SpO2 94 %.Body mass index is 26.29 kg/m.  General Appearance: Casual, Neat and Well Groomed  Eye Contact:  Good  Speech:  Clear and Coherent  Volume:  Normal  Mood:  Euthymic  Affect:  Appropriate and Congruent  Thought Process:  Goal Directed  Orientation:  Full (Time, Place, and Person)  Thought Content: Hallucinations: Auditory   Suicidal Thoughts:  No  Homicidal Thoughts:  No  Memory:  Immediate;   Good Recent;   Good Remote;   Fair  Judgement:  Fair  Insight:  Fair  Psychomotor Activity:  Normal  Concentration:  Concentration: Fair and Attention Span: Fair  Recall:  Good  Fund of Knowledge: Good  Language: Good  Akathisia:  No  Handed:  Right  AIMS (if indicated): not done  Assets:  Communication Skills Desire for Improvement Resilience Social Support Talents/Skills  ADL's:  Intact  Cognition: WNL  Sleep:  Fair   Screenings: Mini-Mental     Clinical Support from 11/18/2016 in [place] at [facility]  Total Score (max 30 points )  20    PHQ2-9     Clinical Support from 11/18/2016 in [employer] at [facility]  PHQ-2 Total Score  1  PHQ-9 Total Score  2       Assessment and Plan: Patient is an 84 year old female with a history of psychotic depression.  The hallucinations have gotten worse last month and seems to be somewhat better with Seroquel.  At least she is sleeping  well now and her affect is brighter.  Since still is disrupting her sleep we will increase Seroquel to 50 mill grams at bedtime and continue 25 mg in the morning.  She will continue clonazepam 1 mg at bedtime for anxiety, Lamictal 50 mg twice a day for mood stabilization and Effexor 37.5 mg twice a day for depression.  She will return to see me in 2 months or call sooner if needed   Levonne Spiller, MD 07/17/2017, 2:22 PM

## 2017-07-30 ENCOUNTER — Other Ambulatory Visit: Payer: Self-pay | Admitting: Endocrinology

## 2017-08-01 ENCOUNTER — Emergency Department (HOSPITAL_COMMUNITY)
Admission: EM | Admit: 2017-08-01 | Discharge: 2017-08-01 | Disposition: A | Discharge status: Home / Self Care | Payer: Medicare Other | Attending: Emergency Medicine | Admitting: Emergency Medicine

## 2017-08-01 ENCOUNTER — Encounter (HOSPITAL_COMMUNITY): Payer: Self-pay | Admitting: Emergency Medicine

## 2017-08-01 DIAGNOSIS — E039 Hypothyroidism, unspecified: Secondary | ICD-10-CM | POA: Insufficient documentation

## 2017-08-01 DIAGNOSIS — K5903 Drug induced constipation: Secondary | ICD-10-CM

## 2017-08-01 DIAGNOSIS — K5909 Other constipation: Secondary | ICD-10-CM | POA: Diagnosis not present

## 2017-08-01 DIAGNOSIS — K5641 Fecal impaction: Secondary | ICD-10-CM

## 2017-08-01 DIAGNOSIS — T402X5A Adverse effect of other opioids, initial encounter: Secondary | ICD-10-CM

## 2017-08-01 DIAGNOSIS — Z79899 Other long term (current) drug therapy: Secondary | ICD-10-CM | POA: Diagnosis not present

## 2017-08-01 DIAGNOSIS — K5900 Constipation, unspecified: Secondary | ICD-10-CM | POA: Diagnosis not present

## 2017-08-01 LAB — COMPREHENSIVE METABOLIC PANEL
ALBUMIN: 3.4 g/dL — AB (ref 3.5–5.0)
ALK PHOS: 80 U/L (ref 38–126)
ALT: 9 U/L — ABNORMAL LOW (ref 14–54)
AST: 20 U/L (ref 15–41)
Anion gap: 10 (ref 5–15)
BUN: 12 mg/dL (ref 6–20)
CALCIUM: 9.4 mg/dL (ref 8.9–10.3)
CO2: 23 mmol/L (ref 22–32)
Chloride: 106 mmol/L (ref 101–111)
Creatinine, Ser: 1.23 mg/dL — ABNORMAL HIGH (ref 0.44–1.00)
GFR calc Af Amer: 46 mL/min — ABNORMAL LOW (ref >60)
GFR calc non Af Amer: 40 mL/min — ABNORMAL LOW (ref >60)
GLUCOSE: 94 mg/dL (ref 65–99)
Potassium: 4.1 mmol/L (ref 3.5–5.1)
SODIUM: 139 mmol/L (ref 135–145)
Total Bilirubin: 0.6 mg/dL (ref 0.3–1.2)
Total Protein: 6.4 g/dL — ABNORMAL LOW (ref 6.5–8.1)

## 2017-08-01 LAB — CBC
HCT: 39.8 % (ref 36.0–46.0)
Hemoglobin: 12.4 g/dL (ref 12.0–15.0)
MCH: 30 pg (ref 26.0–34.0)
MCHC: 31.2 g/dL (ref 30.0–36.0)
MCV: 96.1 fL (ref 78.0–100.0)
PLATELETS: 237 10*3/uL (ref 150–400)
RBC: 4.14 MIL/uL (ref 3.87–5.11)
RDW: 14.4 % (ref 11.5–15.5)
WBC: 8 10*3/uL (ref 4.0–10.5)

## 2017-08-01 LAB — LIPASE, BLOOD: Lipase: 37 U/L (ref 11–51)

## 2017-08-01 NOTE — ED Notes (Signed)
Pt verbalized understanding of discharge instructions.

## 2017-08-01 NOTE — ED Notes (Signed)
Pt husband inquired about wait time. This tech explained process. Family member understands and states" she can only sit down for much longer.

## 2017-08-01 NOTE — Discharge Instructions (Signed)
Disimpaction here today with some success important that you follow-up with your gastroenterology doctor in [place] and call and make an appointment.  Return for any new or worse symptoms.  Continue to take your MiraLAX daily.  Can actually take it for the next several days like 4 times a day.

## 2017-08-01 NOTE — ED Provider Notes (Signed)
[facility] EMERGENCY DEPARTMENT Provider Note   CSN: [id number] Arrival date & time: 08/01/17  1115     History   Chief Complaint Chief Complaint  Patient presents with  . Constipation    HPI Jamie Pitts is a 84 y.o. female.  Patient with a long standing history of constipation.  Seem to have been opioid related.  Currently being followed since April by gastroenterology at [facility] area.  Patient has been seen here as well as [facility] emergency department several times for constipation with stool disimpaction.  Patient states she has not had a bowel movement since June 8.  Patient states she is taking her MiraLAX.  Patient does seem to have some confusion.  Not sure if she is reliably taking her medicine.  Denies any nausea or vomiting.  Patient states she tried a fleets enema last evening with some help.  Denies any fever or any significant abdominal pain.  Says stool just will not come out.     Past Medical History:  Diagnosis Date  . Adenomatous polyp    x1  . ALLERGIC RHINITIS 12/20/2006  . Chronic anxiety   . Chronic back pain 04/14/2007   02/2017 [facility] records reviewed - mild lumbar DDD and L SIJ pain s/p ESI latest 10/2016 with good effect  . Diverticulitis   . GERD (gastroesophageal reflux disease)    intermittent, treated with tums  . Hiatal hernia   . Hypothyroidism   . Insomnia   . Osteoporosis 2007   declined prolia  . Perforation of right tympanic membrane 09/03/2015   Chronic, seen by Dr Janace Hoard ENT   . Personal history of kidney stones    x1  . Postmenopausal atrophic vaginitis 2016   rec estrace cream - [place]  . Recurrent UTI   . Scoliosis   . Severe recurrent depression with psychosis ([place])   . Urinary retention   . Vitamin D deficiency     Patient Active Problem List   Diagnosis Date Noted  . Malnutrition of moderate degree 05/20/2017  . Fecal impaction ([place]) 05/18/2017  . Abdominal pain 05/18/2017  . Long-term  current use of opiate analgesic 03/06/2017  . Chronic low back pain 03/06/2017  . Chronic pain syndrome 03/06/2017  . Nausea 03/04/2017  . Abdominal cramping 05/24/2016  . Perforation of right tympanic membrane 09/03/2015  . COPD (chronic obstructive pulmonary disease) ([place]) 12/07/2014  . Constipation 05/08/2014  . Urinary retention 05/08/2014  . Urinary urgency 04/05/2014  . Chronic cough 03/02/2013  . Dysphagia 11/05/2012  . Drug-induced Parkinsonism ([place]) 07/29/2012  . Insomnia secondary to depression with anxiety 05/22/2012  . Chronic back pain 04/14/2007  . Hypothyroidism 11/04/2006  . GERD 11/04/2006  . OSTEOPOROSIS 11/04/2006  . Depression, major, recurrent, severe with psychosis ([place]) 08/29/2006    Past Surgical History:  Procedure Laterality Date  . ABDOMINAL HYSTERECTOMY  1979   ectopic pregnancy with one ovary removed  . APPENDECTOMY  1954  . CATARACT EXTRACTION, BILATERAL  11/2015  . CHOLECYSTECTOMY    . ESOPHAGOGASTRODUODENOSCOPY    . KNEE SURGERY Left 2012   L medial meniscal tear  . OOPHORECTOMY Right 1965   Ectopic pregnancy, removal of right ovary and tube- 1960  . ROTATOR CUFF REPAIR Right   . TONSILLECTOMY       OB History   None      Home Medications    Prior to Admission medications   Medication Sig Start Date End Date Taking? Authorizing Provider  bisacodyl (DULCOLAX) 10 MG suppository Place 1 suppository (10 mg total) rectally daily as needed for moderate constipation. 05/09/14   Hongalgi, Lenis Dickinson, MD  Calcium Carbonate-Vitamin D (CALCIUM-D PO) Take 1 tablet by mouth daily.    [provider]  clonazePAM (KLONOPIN) 1 MG tablet Take 1 tablet (1 mg total) by mouth at bedtime. 07/17/17 07/17/18  Cloria Spring, MD  dicyclomine (BENTYL) 10 MG capsule Take 1 capsule (10 mg total) by mouth daily as needed for spasms. 05/24/16   Ria [facility], MD  HYDROcodone-acetaminophen (NORCO/VICODIN) 5-325 MG tablet Take 1 tablet by mouth every 6 (six)  hours as needed for moderate pain. 05/24/16   Ria [facility], MD  lamoTRIgine (LAMICTAL) 25 MG tablet Take 2 tablets (50 mg total) by mouth 2 (two) times daily. 06/16/17   Cloria Spring, MD  levothyroxine (SYNTHROID, LEVOTHROID) 88 MCG tablet TAKE 1 TABLET BEFORE BREAKFAST 6 DAYS A WEEK 07/30/17   Elayne Snare, MD  ondansetron (ZOFRAN) 4 MG tablet Take 1 tablet (4 mg total) by mouth every 8 (eight) hours as needed for nausea or vomiting. 03/04/17   Ria [facility], MD  polyethylene glycol powder (GLYCOLAX/MIRALAX) powder polyethylene glycol 3350 17 gram oral powder packet  TK 17 GMS D    [provider]  PROAIR HFA 108 [id number] Base) MCG/ACT inhaler USE 2 INHALATIONS EVERY 6 HOURS AS NEEDED FOR COUGH 12/19/15   Ria [facility], MD  QUEtiapine (SEROQUEL) 25 MG tablet Take one in the am and two at bedtime 07/17/17   Cloria Spring, MD  senna-docusate (SENOKOT-S) 8.6-50 MG tablet Take 2 tablets by mouth daily as needed for mild constipation. 05/20/17 05/20/18  Bettey Costa, MD  venlafaxine (EFFEXOR) 37.5 MG tablet Take 1 tablet (37.5 mg total) by mouth 2 (two) times daily. 06/16/17   Cloria Spring, MD    Family History Family History  Problem Relation Age of Onset  . Anxiety disorder Grandchild   . Depression Grandchild   . Cancer Sister        lung  . Healthy Sister   . Diabetes Mother   . Diabetes Father   . Schizophrenia Sister   . Diabetes Sister   . Diabetes Brother   . Cancer Sister        kidney  . Alcohol abuse Neg Hx   . Bipolar disorder Neg Hx   . Dementia Neg Hx   . Drug abuse Neg Hx   . OCD Neg Hx   . Paranoid behavior Neg Hx   . Seizures Neg Hx   . Sexual abuse Neg Hx   . Physical abuse Neg Hx     Social History Social History   Tobacco Use  . Smoking status: Never Smoker  . Smokeless tobacco: Never Used  Substance Use Topics  . Alcohol use: No  . Drug use: No     Allergies   Amoxicillin; Buprenorphine hcl; Morphine; Morphine and related; Sulfa  antibiotics; Sulfonamide derivatives; and Valium [diazepam]   Review of Systems Review of Systems  Unable to perform ROS: Dementia     Physical Exam Updated Vital Signs BP 104/63 (BP Location: Right Arm)   Pulse 83   Temp 98.3 F (36.8 C) (Oral)   Resp 16   Ht 1.651 m (5\' 5" )   Wt 70.8 kg (156 lb)   LMP 07/15/2012   SpO2 95%   BMI 25.96 kg/m   Physical Exam  Constitutional: She is oriented to person, place, and time. She appears well-developed  and well-nourished. No distress.  HENT:  Head: Normocephalic and atraumatic.  Mouth/Throat: Oropharynx is clear and moist.  Eyes: EOM are normal.  Neck: Neck supple.  Cardiovascular: Normal rate, regular rhythm and normal heart sounds.  Pulmonary/Chest: Effort normal and breath sounds normal. No respiratory distress.  Abdominal: Soft. Bowel sounds are normal. She exhibits distension. There is no tenderness. There is no guarding.  Genitourinary:  Genitourinary Comments: Perianal area with several skin tags.  No prolapsed internal hemorrhoids.  No active bleeding.  Large amount of stool in the rectal vault but this is actually not significantly hard.  So it does seem to be some 's water retention going on with the stool due to the MiraLAX.  Disimpacted as much as possible.  Musculoskeletal: Normal range of motion. She exhibits no edema.  Neurological: She is alert and oriented to person, place, and time. No cranial nerve deficit or sensory deficit. She exhibits normal muscle tone. Coordination normal.  Skin: Skin is warm. No rash noted.  Nursing note and vitals reviewed.    ED Treatments / Results  Labs (all labs ordered are listed, but only abnormal results are displayed) Labs Reviewed  COMPREHENSIVE METABOLIC PANEL - Abnormal; Notable for the following components:      Result Value   Creatinine, Ser 1.23 (*)    Total Protein 6.4 (*)    Albumin 3.4 (*)    ALT 9 (*)    GFR calc non Af Amer 40 (*)    GFR calc Af Amer 46 (*)     All other components within normal limits  LIPASE, BLOOD  CBC    EKG None  Radiology No results found.  Procedures Procedures (including critical care time)  Medications Ordered in ED Medications - No data to display   Initial Impression / Assessment and Plan / ED Course  I have reviewed the triage vital signs and the nursing notes.  Pertinent labs & imaging results that were available during my care of the patient were reviewed by me and considered in my medical decision making (see chart for details).    Patient underwent manual disimpaction of the rectal stool burden.  Stool was actually on the softer side.  Just large amounts of it.  Removed but could be removed.  Suspect that she is going to have more up above and more is going to come down.  Patient's abdomen soft little bit distended no tenderness.  No acute abdominal process.  Patient with a history of chronic constipation appears to be secondary to opioids.  Followed by gastroenterology in the [facility] area.  Patient states she is taking her MiraLAX daily.  Recommend that she start taking it may be 4 times a day for the next few days.  And follow-up with her GI doctors.  Return for any new or worse symptoms.  Clinically not concerned about any acute abdominal process.  Patient is in no distress.  Not any concerns for free air.  No nausea no vomiting.  Do not feel there is any significant obstruction.  Patient does seem to have some component of confusion.  She may not be reliably taking her meds.  Recommended that she follow-up closely with her GI doctors even if by phone on Monday.  Final Clinical Impressions(s) / ED Diagnoses   Final diagnoses:  Constipation due to opioid therapy  Fecal impaction in rectum [facility])    ED Discharge Orders    None       Fredia Sorrow, MD  08/01/17 1701  

## 2017-08-01 NOTE — ED Triage Notes (Signed)
Patient complains of constipation since 6/8, patient reports history of impaction and believes she may be impacted today. No bowel movement since 6/8, states she used a fleet enema last night with minimal relief. Patient alert, oriented, and in no apparent distress at this time.

## 2017-08-06 ENCOUNTER — Ambulatory Visit: Payer: Medicare Other | Admitting: Gastroenterology

## 2017-08-11 ENCOUNTER — Other Ambulatory Visit (HOSPITAL_COMMUNITY): Payer: Self-pay | Admitting: Psychiatry

## 2017-08-11 ENCOUNTER — Telehealth (HOSPITAL_COMMUNITY): Payer: Self-pay | Admitting: *Deleted

## 2017-08-11 MED ORDER — RISPERIDONE 1 MG PO TABS: 1.0000 mg | ORAL_TABLET | Freq: Every day | ORAL | 0 refills | Status: DC

## 2017-08-11 NOTE — Telephone Encounter (Signed)
Discussed with the patient. She states that she has been hearing loud noises, voices since last Friday. She denies CAH. She feels like "brain is like moving," although she denies headache or dizziness. She denies SI. She has been taking seroquel (recently uptitrated) as instructed, but insists to be started on risperidone, stating that seroquel made her voice louder. Although discussed in length that risperidone was switched to other medication in the past by Dr. Harrington Challenger due to adverse reaction, she repeatedly asks to try risperidone. Will start risperidone at lower dose to target AH.Discussed risks, including, but not limited to metabolic side effect, drowsiness, EPS. She is advised contact the office if any worsening symptoms despite this change.  - Start risperidone 1 mg qhs - Discontinue quetiapine - Will forward the message to Dr. Harrington Challenger, who will return to clinic in two weeks

## 2017-08-11 NOTE — Telephone Encounter (Signed)
Dr Modesta Messing Dr Harrington Challenger patient called stating that since Friday her brain has been racing & cranking. It's like a speeding train making a loud-loud noise going thru her head-- she stated. Patient stated she once was taken Risperidone & thinks she may need to go back on. Next appointment isn't until 09/16/17

## 2017-08-28 DIAGNOSIS — Z79891 Long term (current) use of opiate analgesic: Secondary | ICD-10-CM | POA: Diagnosis not present

## 2017-08-28 DIAGNOSIS — G894 Chronic pain syndrome: Secondary | ICD-10-CM | POA: Diagnosis not present

## 2017-08-28 DIAGNOSIS — M545 Low back pain: Secondary | ICD-10-CM | POA: Diagnosis not present

## 2017-08-28 DIAGNOSIS — M5416 Radiculopathy, lumbar region: Secondary | ICD-10-CM | POA: Diagnosis not present

## 2017-09-10 DIAGNOSIS — Z79891 Long term (current) use of opiate analgesic: Secondary | ICD-10-CM | POA: Diagnosis not present

## 2017-09-10 DIAGNOSIS — M545 Low back pain: Secondary | ICD-10-CM | POA: Diagnosis not present

## 2017-09-10 DIAGNOSIS — G894 Chronic pain syndrome: Secondary | ICD-10-CM | POA: Diagnosis not present

## 2017-09-16 ENCOUNTER — Ambulatory Visit: Payer: Medicare Other | Admitting: Gastroenterology

## 2017-09-17 ENCOUNTER — Ambulatory Visit (INDEPENDENT_AMBULATORY_CARE_PROVIDER_SITE_OTHER): Payer: Medicare Other | Admitting: Psychiatry

## 2017-09-17 ENCOUNTER — Encounter (HOSPITAL_COMMUNITY): Payer: Self-pay | Admitting: Psychiatry

## 2017-09-17 VITALS — BP 120/66 | HR 83 | Ht 65.0 in | Wt 167.0 lb

## 2017-09-17 DIAGNOSIS — F29 Unspecified psychosis not due to a substance or known physiological condition: Secondary | ICD-10-CM | POA: Diagnosis not present

## 2017-09-17 DIAGNOSIS — F341 Dysthymic disorder: Secondary | ICD-10-CM

## 2017-09-17 MED ORDER — BENZTROPINE MESYLATE 1 MG PO TABS: 1.0000 mg | ORAL_TABLET | Freq: Every day | ORAL | 2 refills | Status: DC

## 2017-09-17 MED ORDER — CLONAZEPAM 1 MG PO TABS: 1.0000 mg | ORAL_TABLET | Freq: Every day | ORAL | 2 refills | Status: DC

## 2017-09-17 MED ORDER — VENLAFAXINE HCL 37.5 MG PO TABS: 37.5000 mg | ORAL_TABLET | Freq: Two times a day (BID) | ORAL | 2 refills | Status: DC

## 2017-09-17 MED ORDER — LAMOTRIGINE 25 MG PO TABS: 50.0000 mg | ORAL_TABLET | Freq: Two times a day (BID) | ORAL | 2 refills | Status: DC

## 2017-09-17 MED ORDER — HALOPERIDOL 5 MG PO TABS: 5.0000 mg | ORAL_TABLET | Freq: Every day | ORAL | 2 refills | Status: DC

## 2017-09-17 NOTE — Progress Notes (Signed)
BH MD/PA/NP OP Progress Note  09/17/2017 2:23 PM Jamie Pitts  MRN:  [id number]  Chief Complaint:  Chief Complaint    Hallucinations; Depression; Anxiety; Follow-up     HPI:  This patient is a 84 year old married white female who lives with her husband in [place]. She has one daughter and 3 grandchildren. She is retired from the [employer].  Apparently the patient had some sort of psychotic episode in 2013. She was admitted to [facility] because she was hearing voices at home. The voices got so bad that she cannot put a gun under the bed and wanted to shoot him. The patient is a poor historian and is hard of hearing and gets easily confused. She was placed on various medicines in the hospital which have been continued. It's unclear if she is medication compliant. For example she's not taking the Cogentin. She repeats herself a lot.  The patient states she still hearing voices every day. They're not frightening her like they were before but there seemed to her and she can't always get them to stop. She sleeping pretty well she denies being depressed but she is obviously not thinking clearly. She mentions that she's been diagnosed with parkinsonism but she doesn't have a tremor.  The patient returns after 3 months.  She had called when I was on vacation last month and spoke to Dr. Modesta Messing.  She claimed that the Seroquel was no longer helping the auditory hallucinations and that her head was "clanging" and she could get the voices or her racing thoughts to stop.  Dr. Modesta Messing changed her back to respite all the patient's insistence.  She claimed that this helped for a few days but then the voices got worse and currently she is on no antipsychotic.  She denies being depressed but is somewhat anxious.  She states that she is now hearing noises in music and sometimes voices whispering but nothing sinister or frightening.  She states that it is driving her crazy to hear all this stuff.  We  have tried Seroquel, olanzapine, Risperdal and she will clean the work for a while and then stop.  I suggest we try an older generation drug such as Haldol along with Cogentin and she is willing to give it a try. Visit Diagnosis:    ICD-10-CM   1. Psychosis, unspecified psychosis type ([place]) F29   2. ANXIETY DEPRESSION F34.1 lamoTRIgine (LAMICTAL) 25 MG tablet    Past Psychiatric History: Hospitalization in 2013 for auditory hallucinations and depression  Past Medical History:  Past Medical History:  Diagnosis Date  . Adenomatous polyp    x1  . ALLERGIC RHINITIS 12/20/2006  . Chronic anxiety   . Chronic back pain 04/14/2007   02/2017 [facility] records reviewed - mild lumbar DDD and L SIJ pain s/p ESI latest 10/2016 with good effect  . Diverticulitis   . GERD (gastroesophageal reflux disease)    intermittent, treated with tums  . Hiatal hernia   . Hypothyroidism   . Insomnia   . Osteoporosis 2007   declined prolia  . Perforation of right tympanic membrane 09/03/2015   Chronic, seen by Dr Janace Hoard ENT   . Personal history of kidney stones    x1  . Postmenopausal atrophic vaginitis 2016   rec estrace cream - [place]  . Recurrent UTI   . Scoliosis   . Severe recurrent depression with psychosis ([place])   . Urinary retention   . Vitamin D deficiency     Past Surgical  History:  Procedure Laterality Date  . ABDOMINAL HYSTERECTOMY  1979   ectopic pregnancy with one ovary removed  . APPENDECTOMY  1954  . CATARACT EXTRACTION, BILATERAL  11/2015  . CHOLECYSTECTOMY    . ESOPHAGOGASTRODUODENOSCOPY    . KNEE SURGERY Left 2012   L medial meniscal tear  . OOPHORECTOMY Right 1965   Ectopic pregnancy, removal of right ovary and tube- 1960  . ROTATOR CUFF REPAIR Right   . TONSILLECTOMY      Family Psychiatric History: See below  Family History:  Family History  Problem Relation Age of Onset  . Anxiety disorder Grandchild   . Depression Grandchild   . Cancer Sister        lung  . Healthy  Sister   . Diabetes Mother   . Diabetes Father   . Schizophrenia Sister   . Diabetes Sister   . Diabetes Brother   . Cancer Sister        kidney  . Alcohol abuse Neg Hx   . Bipolar disorder Neg Hx   . Dementia Neg Hx   . Drug abuse Neg Hx   . OCD Neg Hx   . Paranoid behavior Neg Hx   . Seizures Neg Hx   . Sexual abuse Neg Hx   . Physical abuse Neg Hx     Social History:  Social History   Socioeconomic History  . Marital status: Married    Spouse name: Not on file  . Number of children: 1  . Years of education: Not on file  . Highest education level: Not on file  Occupational History  . Occupation: retired Market researcher  Social Needs  . Financial resource strain: Patient refused  . Food insecurity:    Worry: Patient refused    Inability: Patient refused  . Transportation needs:    Medical: Patient refused    Non-medical: Patient refused  Tobacco Use  . Smoking status: Never Smoker  . Smokeless tobacco: Never Used  Substance and Sexual Activity  . Alcohol use: No  . Drug use: No  . Sexual activity: Not Currently    Birth control/protection: Diaphragm  Lifestyle  . Physical activity:    Days per week: Patient refused    Minutes per session: Patient refused  . Stress: Patient refused  Relationships  . Social connections:    Talks on phone: Patient refused    Gets together: Patient refused    Attends religious service: Patient refused    Active member of club or organization: Patient refused    Attends meetings of clubs or organizations: Patient refused    Relationship status: Patient refused  Other Topics Concern  . Not on file  Social History Narrative   Lives with husband and grandson [name])   Occ: retired, Sales executive   Edu: College          Allergies:  Allergies  Allergen Reactions  . Amoxicillin Hives    Sores in mouth  . Buprenorphine Hcl Itching    Can tolerate if necessary Can tolerate if necessary Can tolerate if necessary  . Morphine  Itching    Can tolerate if necessary  . Morphine And Related Itching    Can tolerate if necessary  . Sulfa Antibiotics Hives    Sores in mouth Sores in mouth   . Sulfonamide Derivatives Hives    Sores in mouth  . Valium [Diazepam] Itching    Can tolerate if necessary    Metabolic Disorder Labs: No results found for:  HGBA1C, MPG No results found for: PROLACTIN Lab Results  Component Value Date   CHOL 208 (H) 11/18/2016   TRIG 248.0 (H) 11/18/2016   HDL 43.10 11/18/2016   CHOLHDL 5 11/18/2016   VLDL 49.6 (H) 11/18/2016   Lab Results  Component Value Date   TSH 0.147 (L) 05/18/2017   TSH 0.64 11/13/2016    Therapeutic Level Labs: No results found for: LITHIUM No results found for: VALPROATE No components found for:  CBMZ  Current Medications: Current Outpatient Medications  Medication Sig Dispense Refill  . benztropine (COGENTIN) 1 MG tablet Take 1 tablet (1 mg total) by mouth at bedtime. 30 tablet 2  . bisacodyl (DULCOLAX) 10 MG suppository Place 1 suppository (10 mg total) rectally daily as needed for moderate constipation. 12 suppository 0  . Calcium Carbonate-Vitamin D (CALCIUM-D PO) Take 1 tablet by mouth daily.    . clonazePAM (KLONOPIN) 1 MG tablet Take 1 tablet (1 mg total) by mouth at bedtime. 30 tablet 2  . dicyclomine (BENTYL) 10 MG capsule Take 1 capsule (10 mg total) by mouth daily as needed for spasms. 90 capsule 1  . haloperidol (HALDOL) 5 MG tablet Take 1 tablet (5 mg total) by mouth at bedtime. 30 tablet 2  . HYDROcodone-acetaminophen (NORCO/VICODIN) 5-325 MG tablet Take 1 tablet by mouth every 6 (six) hours as needed for moderate pain.    Marland Kitchen lamoTRIgine (LAMICTAL) 25 MG tablet Take 2 tablets (50 mg total) by mouth 2 (two) times daily. 360 tablet 2  . levothyroxine (SYNTHROID, LEVOTHROID) 88 MCG tablet TAKE 1 TABLET BEFORE BREAKFAST 6 DAYS A WEEK 90 tablet 1  . ondansetron (ZOFRAN) 4 MG tablet Take 1 tablet (4 mg total) by mouth every 8 (eight) hours as  needed for nausea or vomiting. 20 tablet 0  . polyethylene glycol powder (GLYCOLAX/MIRALAX) powder polyethylene glycol 3350 17 gram oral powder packet  TK 17 GMS D    . PROAIR HFA 108 (90 Base) MCG/ACT inhaler USE 2 INHALATIONS EVERY 6 HOURS AS NEEDED FOR COUGH 25.5 g 1  . senna-docusate (SENOKOT-S) 8.6-50 MG tablet Take 2 tablets by mouth daily as needed for mild constipation. 30 tablet 1  . venlafaxine (EFFEXOR) 37.5 MG tablet Take 1 tablet (37.5 mg total) by mouth 2 (two) times daily. 180 tablet 2   No current facility-administered medications for this visit.      Musculoskeletal: Strength & Muscle Tone: within normal limits Gait & Station: normal Patient leans: N/A  Psychiatric Specialty Exam: Review of Systems  HENT: Positive for hearing loss.   Psychiatric/Behavioral: Positive for hallucinations. The patient is nervous/anxious and has insomnia.   All other systems reviewed and are negative.   Blood pressure 120/66, pulse 83, height 5\' 5"  (1.651 m), weight 167 lb (75.8 kg), last menstrual period 07/15/2012, SpO2 93 %.Body mass index is 27.79 kg/m.  General Appearance: Casual, Neat and Well Groomed  Eye Contact:  Fair  Speech:  Clear and Coherent  Volume:  Normal  Mood:  Anxious  Affect:  Constricted and Flat  Thought Process:  Goal Directed  Orientation:  Full (Time, Place, and Person)  Thought Content: Hallucinations: Auditory   Suicidal Thoughts:  No  Homicidal Thoughts:  No  Memory:  Immediate;   Good Recent;   Good Remote;   Fair  Judgement:  Fair  Insight:  Lacking  Psychomotor Activity:  Decreased  Concentration:  Concentration: Fair and Attention Span: Fair  Recall:  [employer] of Knowledge: Fair  Language: Kermit Balo  Akathisia:  No  Handed:  Right  AIMS (if indicated): not done  Assets:  Communication Skills Desire for Improvement Resilience Social Support Talents/Skills  ADL's:  Intact  Cognition: WNL  Sleep:  Poor   Screenings: Mini-Mental      Clinical Support from 11/18/2016 in [place] at [facility]  Total Score (max 30 points )  20    PHQ2-9     Clinical Support from 11/18/2016 in [place] at [facility] Total Score  1  PHQ-9 Total Score  2       Assessment and Plan: This patient is an 84 year old female with a history of depression anxiety and an unspecified psychotic disorder.  She also has hearing loss in the right ear which may be contributing to the odd sounds that she hears.  Nevertheless she is being plagued by these noises and voices.  We will try adding Haldol 5 mg at bedtime along with Cogentin 1 mg to see if we can get the voices to stop.  She will continue Effexor 37.5 mg twice daily for depression, Lamictal 50 mg twice daily for mood stabilization, clonazepam 1 mg at bedtime for anxiety.  She will return to see me in 4 weeks   Levonne Spiller, MD 09/17/2017, 2:23 PM

## 2017-10-13 DIAGNOSIS — H6123 Impacted cerumen, bilateral: Secondary | ICD-10-CM | POA: Diagnosis not present

## 2017-10-13 DIAGNOSIS — H722X1 Other marginal perforations of tympanic membrane, right ear: Secondary | ICD-10-CM | POA: Diagnosis not present

## 2017-10-22 ENCOUNTER — Ambulatory Visit (HOSPITAL_COMMUNITY): Payer: Self-pay | Admitting: Psychiatry

## 2017-10-27 ENCOUNTER — Encounter (HOSPITAL_COMMUNITY): Payer: Self-pay | Admitting: Psychiatry

## 2017-10-27 ENCOUNTER — Ambulatory Visit (INDEPENDENT_AMBULATORY_CARE_PROVIDER_SITE_OTHER): Payer: Medicare Other | Admitting: Psychiatry

## 2017-10-27 DIAGNOSIS — Z818 Family history of other mental and behavioral disorders: Secondary | ICD-10-CM | POA: Diagnosis not present

## 2017-10-27 DIAGNOSIS — M549 Dorsalgia, unspecified: Secondary | ICD-10-CM | POA: Diagnosis not present

## 2017-10-27 DIAGNOSIS — F341 Dysthymic disorder: Secondary | ICD-10-CM

## 2017-10-27 MED ORDER — LAMOTRIGINE 25 MG PO TABS: 50.0000 mg | ORAL_TABLET | Freq: Two times a day (BID) | ORAL | 2 refills | Status: DC

## 2017-10-27 MED ORDER — VENLAFAXINE HCL ER 150 MG PO CP24: 150.0000 mg | ORAL_CAPSULE | Freq: Every day | ORAL | 2 refills | Status: DC

## 2017-10-27 MED ORDER — QUETIAPINE FUMARATE 25 MG PO TABS: ORAL_TABLET | ORAL | 2 refills | Status: DC

## 2017-10-27 MED ORDER — CLONAZEPAM 1 MG PO TABS: 1.0000 mg | ORAL_TABLET | Freq: Every day | ORAL | 2 refills | Status: DC

## 2017-10-27 MED ORDER — BENZTROPINE MESYLATE 1 MG PO TABS: 1.0000 mg | ORAL_TABLET | Freq: Every day | ORAL | 2 refills | Status: DC

## 2017-10-27 NOTE — Progress Notes (Signed)
BH MD/PA/NP OP Progress Note  10/27/2017 3:27 PM Jamie Pitts  MRN:  [id number]  Chief Complaint:  Chief Complaint    Hallucinations; Depression; Anxiety; Follow-up     TSV:XBLT patient is a 84 year old married white female who lives with her husband in [place]. She has one daughter and 3 grandchildren. She is retired from the [employer].  Apparently the patient had some sort of psychotic episode in 2013. She was admitted to [facility] because she was hearing voices at home. The voices got so bad that she cannot put a gun under the bed and wanted to shoot him. The patient is a poor historian and is hard of hearing and gets easily confused. She was placed on various medicines in the hospital which have been continued. It's unclear if she is medication compliant. For example she's not taking the Cogentin. She repeats herself a lot.  The patient states she still hearing voices every day. They're not frightening her like they were before but there seemed to her and she can't always get them to stop. She sleeping pretty well she denies being depressed but she is obviously not thinking clearly. She mentions that she's been diagnosed with parkinsonism but she doesn't have a tremor   She returns after 6 weeks.  We have tried numerous antipsychotics for her auditory hallucinations.  She states that on her own she went back to Seroquel.  We tried Haldol last but she claims it did not help.  She is taking Seroquel 25 mg in the morning and 50 mg at bedtime.  She states that by noon the voices are back.  She also states that she is depressed and has low energy.  She is convinced that "the lower part of my spine is broken off and is floating up into my abdominal area and eventually is going to kill me."  I looked through the x-rays I could find in the system and I did not see anything like this in her spinal x-rays.  I tried to reassure her that there is was not happening.  We will get her  [facility].  In the meantime I will increase her Effexor XR from 75 mg daily to 150 mg daily.  She denies suicidal ideation or any thoughts of self-harm.  She seems to misunderstand things that doctors tell her and I have asked her in the future to bring her husband or some other family member with her so we can make sure the understanding is clear. Visit Diagnosis:    ICD-10-CM   1. ANXIETY DEPRESSION F34.1 lamoTRIgine (LAMICTAL) 25 MG tablet    Past Psychiatric History: Hospitalization in 2013 for auditory hallucinations and depression  Past Medical History:  Past Medical History:  Diagnosis Date  . Adenomatous polyp    x1  . ALLERGIC RHINITIS 12/20/2006  . Chronic anxiety   . Chronic back pain 04/14/2007   02/2017 [facility] records reviewed - mild lumbar DDD and L SIJ pain s/p ESI latest 10/2016 with good effect  . Diverticulitis   . GERD (gastroesophageal reflux disease)    intermittent, treated with tums  . Hiatal hernia   . Hypothyroidism   . Insomnia   . Osteoporosis 2007   declined prolia  . Perforation of right tympanic membrane 09/03/2015   Chronic, seen by Dr Janace Hoard ENT   . Personal history of kidney stones    x1  . Postmenopausal atrophic vaginitis 2016   rec estrace cream - [place]  .  Recurrent UTI   . Scoliosis   . Severe recurrent depression with psychosis ([place])   . Urinary retention   . Vitamin D deficiency     Past Surgical History:  Procedure Laterality Date  . ABDOMINAL HYSTERECTOMY  1979   ectopic pregnancy with one ovary removed  . APPENDECTOMY  1954  . CATARACT EXTRACTION, BILATERAL  11/2015  . CHOLECYSTECTOMY    . ESOPHAGOGASTRODUODENOSCOPY    . KNEE SURGERY Left 2012   L medial meniscal tear  . OOPHORECTOMY Right 1965   Ectopic pregnancy, removal of right ovary and tube- 1960  . ROTATOR CUFF REPAIR Right   . TONSILLECTOMY      Family Psychiatric History: See below  Family History:  Family History  Problem Relation Age of  Onset  . Anxiety disorder Grandchild   . Depression Grandchild   . Cancer Sister        lung  . Healthy Sister   . Diabetes Mother   . Diabetes Father   . Schizophrenia Sister   . Diabetes Sister   . Diabetes Brother   . Cancer Sister        kidney  . Alcohol abuse Neg Hx   . Bipolar disorder Neg Hx   . Dementia Neg Hx   . Drug abuse Neg Hx   . OCD Neg Hx   . Paranoid behavior Neg Hx   . Seizures Neg Hx   . Sexual abuse Neg Hx   . Physical abuse Neg Hx     Social History:  Social History   Socioeconomic History  . Marital status: Married    Spouse name: Not on file  . Number of children: 1  . Years of education: Not on file  . Highest education level: Not on file  Occupational History  . Occupation: retired Market researcher  Social Needs  . Financial resource strain: Patient refused  . Food insecurity:    Worry: Patient refused    Inability: Patient refused  . Transportation needs:    Medical: Patient refused    Non-medical: Patient refused  Tobacco Use  . Smoking status: Never Smoker  . Smokeless tobacco: Never Used  Substance and Sexual Activity  . Alcohol use: No  . Drug use: No  . Sexual activity: Not Currently    Birth control/protection: Diaphragm  Lifestyle  . Physical activity:    Days per week: Patient refused    Minutes per session: Patient refused  . Stress: Patient refused  Relationships  . Social connections:    Talks on phone: Patient refused    Gets together: Patient refused    Attends religious service: Patient refused    Active member of club or organization: Patient refused    Attends meetings of clubs or organizations: Patient refused    Relationship status: Patient refused  Other Topics Concern  . Not on file  Social History Narrative   Lives with husband and grandson [name])   Occ: retired, Sales executive   Edu: College          Allergies:  Allergies  Allergen Reactions  . Amoxicillin Hives    Sores in mouth  . Buprenorphine  Hcl Itching    Can tolerate if necessary Can tolerate if necessary Can tolerate if necessary  . Morphine Itching    Can tolerate if necessary  . Morphine And Related Itching    Can tolerate if necessary  . Sulfa Antibiotics Hives    Sores in mouth Sores in mouth   .  Sulfonamide Derivatives Hives    Sores in mouth  . Valium [Diazepam] Itching    Can tolerate if necessary    Metabolic Disorder Labs: No results found for: HGBA1C, MPG No results found for: PROLACTIN Lab Results  Component Value Date   CHOL 208 (H) 11/18/2016   TRIG 248.0 (H) 11/18/2016   HDL 43.10 11/18/2016   CHOLHDL 5 11/18/2016   VLDL 49.6 (H) 11/18/2016   Lab Results  Component Value Date   TSH 0.147 (L) 05/18/2017   TSH 0.64 11/13/2016    Therapeutic Level Labs: No results found for: LITHIUM No results found for: VALPROATE No components found for:  CBMZ  Current Medications: Current Outpatient Medications  Medication Sig Dispense Refill  . benztropine (COGENTIN) 1 MG tablet Take 1 tablet (1 mg total) by mouth at bedtime. 30 tablet 2  . bisacodyl (DULCOLAX) 10 MG suppository Place 1 suppository (10 mg total) rectally daily as needed for moderate constipation. 12 suppository 0  . Calcium Carbonate-Vitamin D (CALCIUM-D PO) Take 1 tablet by mouth daily.    . clonazePAM (KLONOPIN) 1 MG tablet Take 1 tablet (1 mg total) by mouth at bedtime. 30 tablet 2  . dicyclomine (BENTYL) 10 MG capsule Take 1 capsule (10 mg total) by mouth daily as needed for spasms. 90 capsule 1  . HYDROcodone-acetaminophen (NORCO/VICODIN) 5-325 MG tablet Take 1 tablet by mouth every 6 (six) hours as needed for moderate pain.    Marland Kitchen lamoTRIgine (LAMICTAL) 25 MG tablet Take 2 tablets (50 mg total) by mouth 2 (two) times daily. 360 tablet 2  . levothyroxine (SYNTHROID, LEVOTHROID) 88 MCG tablet TAKE 1 TABLET BEFORE BREAKFAST 6 DAYS A WEEK 90 tablet 1  . ondansetron (ZOFRAN) 4 MG tablet Take 1 tablet (4 mg total) by mouth every 8 (eight)  hours as needed for nausea or vomiting. 20 tablet 0  . polyethylene glycol powder (GLYCOLAX/MIRALAX) powder polyethylene glycol 3350 17 gram oral powder packet  TK 17 GMS D    . PROAIR HFA 108 (90 Base) MCG/ACT inhaler USE 2 INHALATIONS EVERY 6 HOURS AS NEEDED FOR COUGH 25.5 g 1  . senna-docusate (SENOKOT-S) 8.6-50 MG tablet Take 2 tablets by mouth daily as needed for mild constipation. 30 tablet 1  . QUEtiapine (SEROQUEL) 25 MG tablet Take one bid and and two at bedtime 360 tablet 2  . venlafaxine XR (EFFEXOR-XR) 150 MG 24 hr capsule Take 1 capsule (150 mg total) by mouth daily with breakfast. 90 capsule 2   No current facility-administered medications for this visit.      Musculoskeletal: Strength & Muscle Tone: within normal limits Gait & Station: normal Patient leans: N/A  Psychiatric Specialty Exam: Review of Systems  Musculoskeletal: Positive for back pain.  Psychiatric/Behavioral: Positive for depression and hallucinations.  All other systems reviewed and are negative.   Blood pressure 132/73, pulse 93, height 5\' 5"  (1.651 m), weight 164 lb (74.4 kg), last menstrual period 07/15/2012, SpO2 94 %.Body mass index is 27.29 kg/m.  General Appearance: Casual and Fairly Groomed  Eye Contact:  Good  Speech:  Clear and Coherent  Volume:  Normal  Mood:  Angry and Dysphoric  Affect:  Constricted  Thought Process:  Goal Directed  Orientation:  Full (Time, Place, and Person)  Thought Content: Hallucinations: Auditory and Rumination   Suicidal Thoughts:  No  Homicidal Thoughts:  No  Memory:  Immediate;   Good Recent;   Fair Remote;   Poor  Judgement:  Impaired  Insight:  Lacking  Psychomotor Activity:  Decreased  Concentration:  Concentration: Fair and Attention Span: Fair  Recall:  Poor  Fund of Knowledge: Fair  Language: Good  Akathisia:  No  Handed:  Right  AIMS (if indicated): not done  Assets:  Communication Skills Desire for Improvement Resilience Social  Support Talents/Skills  ADL's:  Intact  Cognition: WNL  Sleep:  Good   Screenings: Mini-Mental     Clinical Support from 11/18/2016 in [place] at [facility]  Total Score (max 30 points )  20    PHQ2-9     Clinical Support from 11/18/2016 in [place] at [facility] Total Score  1  PHQ-9 Total Score  2       Assessment and Plan: This patient is a 84 year old female with a history of auditory hallucinations and depression.  She seems to be getting more more confused and distraught over imaginary health issues.  I will get her orthopedic records to confirm but I do not think that anything that she is describing is even possible.  For now we will increase Effexor XR to 150 mg every morning, increase Seroquel to 25 mg every morning and noon and 50 mill grams at bedtime for hallucinations.  She will continue Lamictal 50 mg twice daily for mood stabilization Klonopin 1 mg at bedtime for sleep and anxiety and Cogentin 1 mg at bedtime for side effects from Seroquel.  She will return to see me in 6 weeks.   Levonne Spiller, MD 10/27/2017, 3:27 PM

## 2017-11-24 ENCOUNTER — Ambulatory Visit: Payer: Self-pay

## 2017-12-01 ENCOUNTER — Encounter: Payer: Self-pay | Admitting: Family Medicine

## 2017-12-08 ENCOUNTER — Ambulatory Visit (HOSPITAL_COMMUNITY): Payer: Self-pay | Admitting: Psychiatry

## 2017-12-10 ENCOUNTER — Ambulatory Visit (HOSPITAL_COMMUNITY): Payer: Medicare Other | Admitting: Psychiatry

## 2017-12-10 DIAGNOSIS — M5136 Other intervertebral disc degeneration, lumbar region: Secondary | ICD-10-CM | POA: Diagnosis not present

## 2017-12-10 DIAGNOSIS — M545 Low back pain: Secondary | ICD-10-CM | POA: Diagnosis not present

## 2017-12-10 DIAGNOSIS — Z79891 Long term (current) use of opiate analgesic: Secondary | ICD-10-CM | POA: Diagnosis not present

## 2017-12-11 ENCOUNTER — Ambulatory Visit (INDEPENDENT_AMBULATORY_CARE_PROVIDER_SITE_OTHER): Payer: Medicare Other

## 2017-12-11 ENCOUNTER — Ambulatory Visit (HOSPITAL_COMMUNITY): Payer: Self-pay | Admitting: Psychiatry

## 2017-12-11 DIAGNOSIS — Z23 Encounter for immunization: Secondary | ICD-10-CM

## 2017-12-15 ENCOUNTER — Encounter: Payer: Self-pay | Admitting: Family Medicine

## 2017-12-16 ENCOUNTER — Ambulatory Visit (HOSPITAL_COMMUNITY): Payer: Medicare Other | Admitting: Psychiatry

## 2017-12-16 DIAGNOSIS — R05 Cough: Secondary | ICD-10-CM | POA: Diagnosis not present

## 2017-12-16 DIAGNOSIS — J189 Pneumonia, unspecified organism: Secondary | ICD-10-CM | POA: Diagnosis not present

## 2017-12-22 ENCOUNTER — Ambulatory Visit (HOSPITAL_COMMUNITY): Payer: Medicare Other | Admitting: Psychiatry

## 2017-12-29 ENCOUNTER — Other Ambulatory Visit: Payer: Self-pay | Admitting: Endocrinology

## 2017-12-30 ENCOUNTER — Ambulatory Visit (HOSPITAL_COMMUNITY): Payer: Medicare Other | Admitting: Psychiatry

## 2018-01-02 ENCOUNTER — Ambulatory Visit (HOSPITAL_COMMUNITY): Payer: Medicare Other | Admitting: Psychiatry

## 2018-01-09 ENCOUNTER — Ambulatory Visit (HOSPITAL_COMMUNITY): Payer: Medicare Other | Admitting: Psychiatry

## 2018-01-12 ENCOUNTER — Ambulatory Visit (INDEPENDENT_AMBULATORY_CARE_PROVIDER_SITE_OTHER): Payer: Medicare Other | Admitting: Psychiatry

## 2018-01-12 ENCOUNTER — Encounter (HOSPITAL_COMMUNITY): Payer: Self-pay | Admitting: Psychiatry

## 2018-01-12 VITALS — BP 125/81 | HR 86 | Ht 65.0 in | Wt 167.0 lb

## 2018-01-12 DIAGNOSIS — F29 Unspecified psychosis not due to a substance or known physiological condition: Secondary | ICD-10-CM | POA: Diagnosis not present

## 2018-01-12 DIAGNOSIS — F341 Dysthymic disorder: Secondary | ICD-10-CM

## 2018-01-12 MED ORDER — VENLAFAXINE HCL ER 150 MG PO CP24: 150.0000 mg | ORAL_CAPSULE | Freq: Every day | ORAL | 2 refills | Status: DC

## 2018-01-12 MED ORDER — CLONAZEPAM 1 MG PO TABS: 1.0000 mg | ORAL_TABLET | Freq: Every day | ORAL | 2 refills | Status: DC

## 2018-01-12 MED ORDER — QUETIAPINE FUMARATE 25 MG PO TABS: 25.0000 mg | ORAL_TABLET | Freq: Three times a day (TID) | ORAL | 2 refills | Status: DC

## 2018-01-12 MED ORDER — LAMOTRIGINE 25 MG PO TABS: 50.0000 mg | ORAL_TABLET | Freq: Two times a day (BID) | ORAL | 2 refills | Status: DC

## 2018-01-12 NOTE — Progress Notes (Signed)
[place] MD/PA/NP OP Progress Note  01/12/2018 1:33 PM Jamie Pitts  MRN:  [id number]  Chief Complaint:  Chief Complaint    Depression; Anxiety; Schizophrenia     HPI: This patient is a 84 year old married white female who lives with her husband in [place]. She has one daughter and 3 grandchildren. She is retired from the [employer].  Apparently the patient had some sort of psychotic episode in 2013. She was admitted to [facility] because she was hearing voices at home. The voices got so bad that she cannot put a gun under the bed and wanted to shoot him. The patient is a poor historian and is hard of hearing and gets easily confused. She was placed on various medicines in the hospital which have been continued. It's unclear if she is medication compliant. For example she's not taking the Cogentin. She repeats herself a lot.  The patient returns after 2 months.  This time she has brought her husband per my instruction.  She did not seem to understand my directions and I wanted another person here.  He claims he does not really know what she takes and it was not really of much help.  She claims that increasing the Effexor has helped her mood.  She states that she stopped the Seroquel but cannot read late why.  She states she gets very anxious in the middle of the day.  She states the voices "what away on their own about a week ago".  She states that she still hears music in her head.  She also has a fixed delusion that "my spine is going to roll up and end up under my armpit or into my brain and kill me."  She will let go of this thought we been over it many times.  Her husband even told her that it was irrational but she does not want to listen.  I have read her the reports of her spinal x-rays and MRIs but she still does not get it.  Finally I told her husband perhaps trying not to talk about it as it gets her upset.  I also explained that if she has auditory hallucinations of any kind  she still needs to be on Seroquel. Visit Diagnosis:    ICD-10-CM   1. Psychosis, unspecified psychosis type ([place]) F29   2. ANXIETY DEPRESSION F34.1 lamoTRIgine (LAMICTAL) 25 MG tablet    Past Psychiatric History: Hospitalization in 2013 for auditory hallucinations and depression  Past Medical History:  Past Medical History:  Diagnosis Date  . Adenomatous polyp    x1  . ALLERGIC RHINITIS 12/20/2006  . Chronic anxiety   . Chronic back pain 04/14/2007   02/2017 Jamie Pitts records reviewed - mild lumbar DDD and L SIJ pain s/p ESI latest 10/2016 with good effect  . Diverticulitis   . GERD (gastroesophageal reflux disease)    intermittent, treated with tums  . Hiatal hernia   . Hypothyroidism   . Insomnia   . Osteoporosis 2007   declined prolia  . Perforation of right tympanic membrane 09/03/2015   Chronic, seen by Dr Jamie Pitts ENT   . Personal history of kidney stones    x1  . Postmenopausal atrophic vaginitis 2016   rec estrace cream - Jamie Pitts  . Recurrent UTI   . Scoliosis   . Severe recurrent depression with psychosis ([place])   . Urinary retention   . Vitamin D deficiency     Past Surgical History:  Procedure Laterality Date  .  ABDOMINAL HYSTERECTOMY  1979   ectopic pregnancy with one ovary removed  . APPENDECTOMY  1954  . CATARACT EXTRACTION, BILATERAL  11/2015  . CHOLECYSTECTOMY    . ESOPHAGOGASTRODUODENOSCOPY    . KNEE SURGERY Left 2012   L medial meniscal tear  . OOPHORECTOMY Right 1965   Ectopic pregnancy, removal of right ovary and tube- 1960  . ROTATOR CUFF REPAIR Right   . TONSILLECTOMY      Family Psychiatric History: see below  Family History:  Family History  Problem Relation Age of Onset  . Anxiety disorder Grandchild   . Depression Grandchild   . Cancer Sister        lung  . Healthy Sister   . Diabetes Mother   . Diabetes Father   . Schizophrenia Sister   . Diabetes Sister   . Diabetes Brother   . Cancer Sister        kidney  . Alcohol abuse Neg Hx    . Bipolar disorder Neg Hx   . Dementia Neg Hx   . Drug abuse Neg Hx   . OCD Neg Hx   . Paranoid behavior Neg Hx   . Seizures Neg Hx   . Sexual abuse Neg Hx   . Physical abuse Neg Hx     Social History:  Social History   Socioeconomic History  . Marital status: Married    Spouse name: Not on file  . Number of children: 1  . Years of education: Not on file  . Highest education level: Not on file  Occupational History  . Occupation: retired Market researcher  Social Needs  . Financial resource strain: Patient refused  . Food insecurity:    Worry: Patient refused    Inability: Patient refused  . Transportation needs:    Medical: Patient refused    Non-medical: Patient refused  Tobacco Use  . Smoking status: Never Smoker  . Smokeless tobacco: Never Used  Substance and Sexual Activity  . Alcohol use: No  . Drug use: No  . Sexual activity: Not Currently    Birth control/protection: Diaphragm  Lifestyle  . Physical activity:    Days per week: Patient refused    Minutes per session: Patient refused  . Stress: Patient refused  Relationships  . Social connections:    Talks on phone: Patient refused    Gets together: Patient refused    Attends religious service: Patient refused    Active member of club or organization: Patient refused    Attends meetings of clubs or organizations: Patient refused    Relationship status: Patient refused  Other Topics Concern  . Not on file  Social History Narrative   Lives with husband and grandson Jamie Pitts)   Occ: retired, Sales executive   Edu: College          Allergies:  Allergies  Allergen Reactions  . Amoxicillin Hives    Sores in mouth  . Buprenorphine Hcl Itching    Can tolerate if necessary Can tolerate if necessary Can tolerate if necessary  . Morphine Itching    Can tolerate if necessary  . Morphine And Related Itching    Can tolerate if necessary  . Sulfa Antibiotics Hives    Sores in mouth Sores in mouth   .  Sulfonamide Derivatives Hives    Sores in mouth  . Valium [Diazepam] Itching    Can tolerate if necessary    Metabolic Disorder Labs: No results found for: HGBA1C, MPG No results found for: PROLACTIN  Lab Results  Component Value Date   CHOL 208 (H) 11/18/2016   TRIG 248.0 (H) 11/18/2016   HDL 43.10 11/18/2016   CHOLHDL 5 11/18/2016   VLDL 49.6 (H) 11/18/2016   Lab Results  Component Value Date   TSH 0.147 (L) 05/18/2017   TSH 0.64 11/13/2016    Therapeutic Level Labs: No results found for: LITHIUM No results found for: VALPROATE No components found for:  CBMZ  Current Medications: Current Outpatient Medications  Medication Sig Dispense Refill  . bisacodyl (DULCOLAX) 10 MG suppository Place 1 suppository (10 mg total) rectally daily as needed for moderate constipation. 12 suppository 0  . Calcium Carbonate-Vitamin D (CALCIUM-D PO) Take 1 tablet by mouth daily.    . clonazePAM (KLONOPIN) 1 MG tablet Take 1 tablet (1 mg total) by mouth at bedtime. 30 tablet 2  . dicyclomine (BENTYL) 10 MG capsule Take 1 capsule (10 mg total) by mouth daily as needed for spasms. 90 capsule 1  . HYDROcodone-acetaminophen (NORCO/VICODIN) 5-325 MG tablet Take 1 tablet by mouth every 6 (six) hours as needed for moderate pain.    Marland Kitchen lamoTRIgine (LAMICTAL) 25 MG tablet Take 2 tablets (50 mg total) by mouth 2 (two) times daily. 360 tablet 2  . levothyroxine (SYNTHROID, LEVOTHROID) 88 MCG tablet TAKE 1 TABLET BEFORE BREAKFAST 6 DAYS A WEEK 90 tablet 4  . ondansetron (ZOFRAN) 4 MG tablet Take 1 tablet (4 mg total) by mouth every 8 (eight) hours as needed for nausea or vomiting. 20 tablet 0  . polyethylene glycol powder (GLYCOLAX/MIRALAX) powder polyethylene glycol 3350 17 gram oral powder packet  TK 17 GMS D    . PROAIR HFA 108 (90 Base) MCG/ACT inhaler USE 2 INHALATIONS EVERY 6 HOURS AS NEEDED FOR COUGH 25.5 g 1  . QUEtiapine (SEROQUEL) 25 MG tablet Take 1 tablet (25 mg total) by mouth 3 (three) times  daily. 90 tablet 2  . senna-docusate (SENOKOT-S) 8.6-50 MG tablet Take 2 tablets by mouth daily as needed for mild constipation. 30 tablet 1  . venlafaxine XR (EFFEXOR-XR) 150 MG 24 hr capsule Take 1 capsule (150 mg total) by mouth daily with breakfast. 90 capsule 2   No current facility-administered medications for this visit.      Musculoskeletal: Strength & Muscle Tone: decreased Gait & Station: normal Patient leans: N/A  Psychiatric Specialty Exam: Review of Systems  Gastrointestinal: Positive for constipation.  Musculoskeletal: Positive for back pain.  Psychiatric/Behavioral: Positive for hallucinations and memory loss. The patient is nervous/anxious.   All other systems reviewed and are negative.   Blood pressure 125/81, pulse 86, height 5\' 5"  (1.651 m), weight 167 lb (75.8 kg), last menstrual period 07/15/2012, SpO2 94 %.Body mass index is 27.79 kg/m.  General Appearance: Casual, Neat and Well Groomed  Eye Contact:  Good  Speech:  Clear and Coherent  Volume:  Normal  Mood:  Anxious  Affect:  Blunt  Thought Process:  Disorganized  Orientation:  Full (Time, Place, and Person)  Thought Content: Delusions, Hallucinations: Auditory and Rumination   Suicidal Thoughts:  No  Homicidal Thoughts:  No  Memory:  Immediate;   Good Recent;   Fair Remote;   Poor  Judgement:  Impaired  Insight:  Lacking  Psychomotor Activity:  Decreased  Concentration:  Concentration: Fair and Attention Span: Fair  Recall:  [employer] of Knowledge: Fair  Language: Good  Akathisia:no  Handed:  Right  AIMS (if indicated): not done  Assets:  Communication Skills Desire for Improvement Resilience  Social Support  ADL's:  Intact  Cognition: WNL  Sleep:  Good   Screenings: Mini-Mental     Clinical Support from 11/18/2016 in [place] at [facility]  Total Score (max 30 points )  20    PHQ2-9     Clinical Support from 11/18/2016 in [place] at [facility] Total  Score  1  PHQ-9 Total Score  2       Assessment and Plan:  This patient is a an 84 year old female with a history of depression anxiety psychosis and probable dementia.  She has fixed delusions that are going to be very difficult to remove.  She is not taking any antipsychotic right now and I urged her to go back on Seroquel 25 mg 3 times daily and she agrees.  She will continue Effexor XR 150 mg daily for depression, Lamictal 50 mg twice daily for mood stabilization and clonazepam 1 mg at bedtime for sleep.  She will return to see me in 3 months  Levonne Spiller, MD 01/12/2018, 1:33 PM

## 2018-01-22 ENCOUNTER — Encounter: Payer: Self-pay | Admitting: Emergency Medicine

## 2018-01-22 ENCOUNTER — Emergency Department
Admission: EM | Admit: 2018-01-22 | Discharge: 2018-01-22 | Disposition: A | Discharge status: Home / Self Care | Payer: Medicare Other | Attending: Emergency Medicine | Admitting: Emergency Medicine

## 2018-01-22 ENCOUNTER — Emergency Department: Payer: Medicare Other

## 2018-01-22 DIAGNOSIS — S52501A Unspecified fracture of the lower end of right radius, initial encounter for closed fracture: Secondary | ICD-10-CM | POA: Diagnosis not present

## 2018-01-22 DIAGNOSIS — S6991XA Unspecified injury of right wrist, hand and finger(s), initial encounter: Secondary | ICD-10-CM | POA: Diagnosis present

## 2018-01-22 DIAGNOSIS — Y9389 Activity, other specified: Secondary | ICD-10-CM | POA: Diagnosis not present

## 2018-01-22 DIAGNOSIS — E039 Hypothyroidism, unspecified: Secondary | ICD-10-CM | POA: Diagnosis not present

## 2018-01-22 DIAGNOSIS — Y998 Other external cause status: Secondary | ICD-10-CM | POA: Insufficient documentation

## 2018-01-22 DIAGNOSIS — Y929 Unspecified place or not applicable: Secondary | ICD-10-CM | POA: Insufficient documentation

## 2018-01-22 DIAGNOSIS — W1842XA Slipping, tripping and stumbling without falling due to stepping into hole or opening, initial encounter: Secondary | ICD-10-CM | POA: Diagnosis not present

## 2018-01-22 DIAGNOSIS — S59291A Other physeal fracture of lower end of radius, right arm, initial encounter for closed fracture: Secondary | ICD-10-CM | POA: Diagnosis not present

## 2018-01-22 DIAGNOSIS — J449 Chronic obstructive pulmonary disease, unspecified: Secondary | ICD-10-CM | POA: Diagnosis not present

## 2018-01-22 DIAGNOSIS — Z79899 Other long term (current) drug therapy: Secondary | ICD-10-CM | POA: Diagnosis not present

## 2018-01-22 NOTE — ED Triage Notes (Signed)
Pt reports stepped off a step wrong a week ago and hurt her right hand and it is still painful. Pt reports pain is to the lateral side of hand and wrist.

## 2018-01-22 NOTE — ED Provider Notes (Addendum)
[facility] Emergency Department Provider Note  ____________________________________________  Time seen: Approximately 1:45 PM  I have reviewed the triage vital signs and the nursing notes.   HISTORY  Chief Complaint Hand Injury    HPI Jamie Pitts is a 84 y.o. female that presents to emergency department for evaluation of right hand pain after tripping in a hole 1 week ago.  Hand has been painful and bruising since.  She is continued to use hand and wrist.  No additional injuries.  No numbness or tingling.  Past Medical History:  Diagnosis Date  . Adenomatous polyp    x1  . ALLERGIC RHINITIS 12/20/2006  . Chronic anxiety   . Chronic back pain 04/14/2007   02/2017 N[place] [facility] records reviewed - mild lumbar DDD and L SIJ pain s/p ESI latest 10/2016 with good effect  . Diverticulitis   . GERD (gastroesophageal reflux disease)    intermittent, treated with tums  . Hiatal hernia   . Hypothyroidism   . Insomnia   . Osteoporosis 2007   declined prolia  . Perforation of right tympanic membrane 09/03/2015   Chronic, seen by Dr Janace Hoard ENT   . Personal history of kidney stones    x1  . Postmenopausal atrophic vaginitis 2016   rec estrace cream - [place]  . Recurrent UTI   . Scoliosis   . Severe recurrent depression with psychosis ([place])   . Urinary retention   . Vitamin D deficiency     Patient Active Problem List   Diagnosis Date Noted  . Malnutrition of moderate degree 05/20/2017  . Fecal impaction ([place]) 05/18/2017  . Abdominal pain 05/18/2017  . Long-term current use of opiate analgesic 03/06/2017  . Chronic low back pain 03/06/2017  . Chronic pain syndrome 03/06/2017  . Nausea 03/04/2017  . Abdominal cramping 05/24/2016  . Perforation of right tympanic membrane 09/03/2015  . COPD (chronic obstructive pulmonary disease) ([place]) 12/07/2014  . Constipation 05/08/2014  . Urinary retention 05/08/2014  . Urinary urgency 04/05/2014  . Chronic cough  03/02/2013  . Dysphagia 11/05/2012  . Drug-induced Parkinsonism ([place]) 07/29/2012  . Insomnia secondary to depression with anxiety 05/22/2012  . Chronic back pain 04/14/2007  . Hypothyroidism 11/04/2006  . GERD 11/04/2006  . OSTEOPOROSIS 11/04/2006  . Depression, major, recurrent, severe with psychosis ([place]) 08/29/2006    Past Surgical History:  Procedure Laterality Date  . ABDOMINAL HYSTERECTOMY  1979   ectopic pregnancy with one ovary removed  . APPENDECTOMY  1954  . CATARACT EXTRACTION, BILATERAL  11/2015  . CHOLECYSTECTOMY    . ESOPHAGOGASTRODUODENOSCOPY    . KNEE SURGERY Left 2012   L medial meniscal tear  . OOPHORECTOMY Right 1965   Ectopic pregnancy, removal of right ovary and tube- 1960  . ROTATOR CUFF REPAIR Right   . TONSILLECTOMY      Prior to Admission medications   Medication Sig Start Date End Date Taking? Authorizing Provider  bisacodyl (DULCOLAX) 10 MG suppository Place 1 suppository (10 mg total) rectally daily as needed for moderate constipation. 05/09/14   Hongalgi, Lenis Dickinson, MD  Calcium Carbonate-Vitamin D (CALCIUM-D PO) Take 1 tablet by mouth daily.    [provider]  clonazePAM (KLONOPIN) 1 MG tablet Take 1 tablet (1 mg total) by mouth at bedtime. 01/12/18 01/12/19  Cloria Spring, MD  dicyclomine (BENTYL) 10 MG capsule Take 1 capsule (10 mg total) by mouth daily as needed for spasms. 05/24/16   Ria Bush, MD  HYDROcodone-acetaminophen (NORCO/VICODIN) 5-325 MG tablet  Take 1 tablet by mouth every 6 (six) hours as needed for moderate pain. 05/24/16   Ria Bush, MD  lamoTRIgine (LAMICTAL) 25 MG tablet Take 2 tablets (50 mg total) by mouth 2 (two) times daily. 01/12/18   Cloria Spring, MD  levothyroxine (SYNTHROID, LEVOTHROID) 88 MCG tablet TAKE 1 TABLET BEFORE BREAKFAST 6 DAYS A WEEK 12/29/17   Elayne Snare, MD  ondansetron (ZOFRAN) 4 MG tablet Take 1 tablet (4 mg total) by mouth every 8 (eight) hours as needed for nausea or vomiting.  03/04/17   Ria Bush, MD  polyethylene glycol powder (GLYCOLAX/MIRALAX) powder polyethylene glycol 3350 17 gram oral powder packet  TK 17 GMS D    [provider]  PROAIR HFA 108 (90 Base) MCG/ACT inhaler USE 2 INHALATIONS EVERY 6 HOURS AS NEEDED FOR COUGH 12/19/15   Ria Bush, MD  QUEtiapine (SEROQUEL) 25 MG tablet Take 1 tablet (25 mg total) by mouth 3 (three) times daily. 01/12/18   Cloria Spring, MD  senna-docusate (SENOKOT-S) 8.6-50 MG tablet Take 2 tablets by mouth daily as needed for mild constipation. 05/20/17 05/20/18  Bettey Costa, MD  venlafaxine XR (EFFEXOR-XR) 150 MG 24 hr capsule Take 1 capsule (150 mg total) by mouth daily with breakfast. 01/12/18 01/12/19  Cloria Spring, MD    Allergies Amoxicillin; Buprenorphine hcl; Morphine; Morphine and related; Sulfa antibiotics; Sulfonamide derivatives; and Valium [diazepam]  Family History  Problem Relation Age of Onset  . Anxiety disorder Grandchild   . Depression Grandchild   . Cancer Sister        lung  . Healthy Sister   . Diabetes Mother   . Diabetes Father   . Schizophrenia Sister   . Diabetes Sister   . Diabetes Brother   . Cancer Sister        kidney  . Alcohol abuse Neg Hx   . Bipolar disorder Neg Hx   . Dementia Neg Hx   . Drug abuse Neg Hx   . OCD Neg Hx   . Paranoid behavior Neg Hx   . Seizures Neg Hx   . Sexual abuse Neg Hx   . Physical abuse Neg Hx     Social History Social History   Tobacco Use  . Smoking status: Never Smoker  . Smokeless tobacco: Never Used  Substance Use Topics  . Alcohol use: No  . Drug use: No     Review of Systems  Cardiovascular: No chest pain. Respiratory: No SOB. Gastrointestinal: No abdominal pain.  No nausea, no vomiting.  Musculoskeletal: Positive for hand pain. Skin: Negative for rash, abrasions, lacerations. Positive for ecchymosis.  Neurological: Negative for numbness or  tingling   ____________________________________________   PHYSICAL EXAM:  VITAL SIGNS: ED Triage Vitals  Enc Vitals Group     BP 01/22/18 1227 122/72     Pulse Rate 01/22/18 1227 100     Resp 01/22/18 1227 18     Temp 01/22/18 1227 98.2 F (36.8 C)     Temp Source 01/22/18 1227 Oral     SpO2 01/22/18 1227 93 %     Weight 01/22/18 1221 160 lb (72.6 kg)     Height 01/22/18 1221 5\' 4"  (1.626 m)     Head Circumference --      Peak Flow --      Pain Score 01/22/18 1221 6     Pain Loc --      Pain Edu? --      Excl. in [place]? --  Constitutional: Well appearing and in no acute distress. Eyes: Conjunctivae are normal. PERRL. EOMI. Head: Atraumatic. ENT:      Ears:      Nose: No congestion/rhinnorhea.      Mouth/Throat: Mucous membranes are moist.  Neck: No stridor.   Cardiovascular: Normal rate, regular rhythm.  Good peripheral circulation.  Symmetric radial pulses bilaterally. Respiratory: Normal respiratory effort without tachypnea or retractions. Lungs CTAB. Good air entry to the bases with no decreased or absent breath sounds. Gastrointestinal: Bowel sounds 4 quadrants. Soft and nontender to palpation. No guarding or rigidity. No palpable masses. Musculoskeletal: Full range of motion to all extremities. No gross deformities appreciated.  Mild swelling to right wrist.  Grip strength intact. Neurologic:  Normal speech and language. No gross focal neurologic deficits are appreciated.  Skin:  Skin is warm, dry and intact. No rash noted.  Ecchymosis to radial dorsal right hand.   ____________________________________________   LABS (all labs ordered are listed, but only abnormal results are displayed)  Labs Reviewed - No data to display ____________________________________________  EKG   ____________________________________________  RADIOLOGY Robinette Haines, personally viewed and evaluated these images (plain radiographs) as part of my medical decision making, as  well as reviewing the written report by the radiologist.  Dg Hand Complete Right  Result Date: 01/22/2018 CLINICAL DATA:  Hand pain after fall EXAM: RIGHT HAND - COMPLETE 3+ VIEW COMPARISON:  None. FINDINGS: Fracture of the distal radial metaphysis is noted without significant displacement with slight dorsal angulation. Possible extension of fracture to the distal radioulnar joint though this is less definitive. There is chondrocalcinosis of the triangular fibrocartilage complex. Osteoarthritic bone-on-bone apposition of the triscaphe articulation of the wrist is noted with degenerative joint space narrowing at the base of the thumb metacarpal. Osteoarthritis of the peripheral joints of the hand, greatest at the second DIP joint and interphalangeal joint of the thumb are identified. No marginal nor extra articular erosions. Mild soft tissue swelling of the distal forearm and wrist is noted. IMPRESSION: 1. Acute, closed fracture of the distal radial metaphysis with possible extension into the distal radioulnar joint though this is less definitive. Minimal dorsal angulation of the radial epiphysis is noted due to the fracture with associated soft tissue swelling. 2. 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 Electronically Signed   By: Dominyk Law Royalty M.D.   On: 01/22/2018 14:46    ____________________________________________    PROCEDURES  Procedure(s) performed:    Procedures    Medications - No data to display   ____________________________________________   INITIAL IMPRESSION / ASSESSMENT AND PLAN / ED COURSE  Pertinent labs & imaging results that were available during my care of the patient were reviewed by me and considered in my medical decision making (see chart for details).  Review of the [place] CSRS was performed in accordance of the [place] prior to dispensing any controlled drugs.     Patient presented to  the emergency department for evaluation of right hand and wrist pain after fall 1 week ago.  Vital signs and exam are reassuring.  X-ray consistent with distal radius fracture.  Dr. Sabra Heck was consulted and reviewed x-rays.  He recommends that patient be splinted and follow-up with him in clinic early next week. Volar splint was placed. Sling was given. Patient is to follow up with ortho as directed. Patient is given ED precautions to return to the ED for any worsening or  new symptoms.     ____________________________________________  FINAL CLINICAL IMPRESSION(S) / ED DIAGNOSES  Final diagnoses:  Closed fracture of distal end of right radius, unspecified fracture morphology, initial encounter      NEW MEDICATIONS STARTED DURING THIS VISIT:  ED Discharge Orders    None          This chart was dictated using voice recognition software/Dragon. Despite best efforts to proofread, errors can occur which can change the meaning. Any change was purely unintentional.    Laban Emperor, PA-C 01/22/18 1606    Laban Emperor, PA-C 01/22/18 1607    Lisa Roca, MD 01/22/18 [phone]

## 2018-01-22 NOTE — Discharge Instructions (Signed)
Your x-ray is consistent with a fracture near your wrist.  Elevate arm when possible for the next several days.  Please wear splint until seen by orthopedics.  Please call Dr. Ammie Ferrier office today or tomorrow for an appointment either Monday or Tuesday.  He is expecting to see you.

## 2018-01-22 NOTE — ED Notes (Signed)
See triage note  States she fell in a hole in her yard  Presents with bruising and swelling to right hand  Good pulses

## 2018-01-27 ENCOUNTER — Other Ambulatory Visit: Payer: Self-pay | Admitting: Endocrinology

## 2018-01-27 DIAGNOSIS — E559 Vitamin D deficiency, unspecified: Secondary | ICD-10-CM

## 2018-01-27 DIAGNOSIS — E063 Autoimmune thyroiditis: Secondary | ICD-10-CM

## 2018-01-28 DIAGNOSIS — S52531A Colles' fracture of right radius, initial encounter for closed fracture: Secondary | ICD-10-CM | POA: Diagnosis not present

## 2018-01-29 ENCOUNTER — Other Ambulatory Visit (INDEPENDENT_AMBULATORY_CARE_PROVIDER_SITE_OTHER): Payer: Medicare Other

## 2018-01-29 DIAGNOSIS — E063 Autoimmune thyroiditis: Secondary | ICD-10-CM

## 2018-01-29 DIAGNOSIS — E559 Vitamin D deficiency, unspecified: Secondary | ICD-10-CM | POA: Diagnosis not present

## 2018-01-29 LAB — T4, FREE: Free T4: 0.9 ng/dL (ref 0.60–1.60)

## 2018-01-29 LAB — TSH: TSH: 0.45 u[IU]/mL (ref 0.35–4.50)

## 2018-01-29 LAB — VITAMIN D 25 HYDROXY (VIT D DEFICIENCY, FRACTURES): VITD: 34.63 ng/mL (ref 30.00–100.00)

## 2018-02-04 ENCOUNTER — Ambulatory Visit (INDEPENDENT_AMBULATORY_CARE_PROVIDER_SITE_OTHER): Payer: Medicare Other | Admitting: Endocrinology

## 2018-02-04 ENCOUNTER — Encounter: Payer: Self-pay | Admitting: Endocrinology

## 2018-02-04 VITALS — BP 142/84 | HR 92 | Ht 64.0 in | Wt 167.0 lb

## 2018-02-04 DIAGNOSIS — E559 Vitamin D deficiency, unspecified: Secondary | ICD-10-CM | POA: Diagnosis not present

## 2018-02-04 DIAGNOSIS — M810 Age-related osteoporosis without current pathological fracture: Secondary | ICD-10-CM | POA: Diagnosis not present

## 2018-02-04 DIAGNOSIS — E063 Autoimmune thyroiditis: Secondary | ICD-10-CM | POA: Diagnosis not present

## 2018-02-04 MED ORDER — LEVOTHYROXINE SODIUM 75 MCG PO TABS: ORAL_TABLET | ORAL | 1 refills | Status: DC

## 2018-02-04 MED ORDER — VITAMIN D (ERGOCALCIFEROL) 1.25 MG (50000 UNIT) PO CAPS: 50000.0000 [IU] | ORAL_CAPSULE | ORAL | 1 refills | Status: DC

## 2018-02-04 NOTE — Progress Notes (Signed)
Patient ID: Jamie Pitts, female   DOB: 1933-04-11, 84 y.o.   MRN: [id number]    Reason for Appointment:  followup visit    History of Present Illness:   HYPOTHYROIDISM: This was first diagnosed in 1980 This was diagnosed when she had symptoms of fatigue and hair loss No history of goiter  She has been on generic Synthroid and has been on 88 mcg daily and is supposed to be taking 6 tablets a week In March TSH was low and she was told to take dosage 6 days a week instead of 6-1/2 pills/week She is appearing confused today and not clear if she is giving an accurate history  She is not complaining of feeling unusual fatigue Her weight has fluctuated  She is taking the tablet in the morning before breakfast.        Wt Readings from Last 3 Encounters:  02/04/18 167 lb (75.8 kg)  01/22/18 160 lb (72.6 kg)  08/01/17 156 lb (70.8 kg)    TSH is again low normal    Lab Results  Component Value Date   TSH 0.45 01/29/2018   TSH 0.147 (L) 05/18/2017   TSH 0.64 11/13/2016   FREET4 0.90 01/29/2018   FREET4 0.89 11/13/2016   FREET4 1.10 12/26/2015     OSTEOPOROSIS:  She has had asymptomatic osteoporosis, diagnosed probably in 1997  At that time she was treated with bisphosphonates, probably Fosamax for about 7 years  Was taking Fosamax in 2006 and Actonel in 2007, not clear how long she continued Actonel  She had a T score of -2.8 in 2007 Follow-up bone density in 7/15 indicated lowest T score is -1.9 at the spine, normal at the hip She has not had a follow-up bone density subsequently  She also has had vitamin D deficiency  She was told to take vitamin D by prescription every other week but does not appear to have a prescription and she does not know what she is taking  She does think that she is taking some calcium and multivitamins   Lab Results  Component Value Date   VD25OH 34.63 01/29/2018   VD25OH 35.70 12/26/2015   VD25OH 54.60 10/13/2013      Allergies as of 02/04/2018      Reactions   Amoxicillin Hives   Sores in mouth   Buprenorphine Hcl Itching   Can tolerate if necessary Can tolerate if necessary Can tolerate if necessary   Morphine Itching   Can tolerate if necessary   Morphine And Related Itching   Can tolerate if necessary   Sulfa Antibiotics Hives   Sores in mouth Sores in mouth   Sulfonamide Derivatives Hives   Sores in mouth   Valium [diazepam] Itching   Can tolerate if necessary      Medication List       Accurate as of February 04, 2018 10:29 AM. Always use your most recent med list.        CALCIUM-D PO Take 1 tablet by mouth daily.   clonazePAM 1 MG tablet Commonly known as:  KLONOPIN Take 1 tablet (1 mg total) by mouth at bedtime.   dicyclomine 10 MG capsule Commonly known as:  BENTYL Take 1 capsule (10 mg total) by mouth daily as needed for spasms.   HYDROcodone-acetaminophen 5-325 MG tablet Commonly known as:  NORCO/VICODIN Take 1 tablet by mouth every 6 (six) hours as needed for moderate pain.   lamoTRIgine 25 MG tablet Commonly known as:  LAMICTAL  Take 2 tablets (50 mg total) by mouth 2 (two) times daily.   levothyroxine 88 MCG tablet Commonly known as:  SYNTHROID, LEVOTHROID TAKE 1 TABLET BEFORE BREAKFAST 6 DAYS A WEEK   ondansetron 4 MG tablet Commonly known as:  ZOFRAN Take 1 tablet (4 mg total) by mouth every 8 (eight) hours as needed for nausea or vomiting.   polyethylene glycol powder powder Commonly known as:  GLYCOLAX/MIRALAX polyethylene glycol 3350 17 gram oral powder packet  TK 17 GMS D   PROAIR HFA 108 (90 Base) MCG/ACT inhaler Generic drug:  albuterol USE 2 INHALATIONS EVERY 6 HOURS AS NEEDED FOR COUGH   QUEtiapine 25 MG tablet Commonly known as:  SEROQUEL Take 1 tablet (25 mg total) by mouth 3 (three) times daily.   venlafaxine XR 150 MG 24 hr capsule Commonly known as:  EFFEXOR-XR Take 1 capsule (150 mg total) by mouth daily with breakfast.        Allergies:  Allergies  Allergen Reactions  . Amoxicillin Hives    Sores in mouth  . Buprenorphine Hcl Itching    Can tolerate if necessary Can tolerate if necessary Can tolerate if necessary  . Morphine Itching    Can tolerate if necessary  . Morphine And Related Itching    Can tolerate if necessary  . Sulfa Antibiotics Hives    Sores in mouth Sores in mouth   . Sulfonamide Derivatives Hives    Sores in mouth  . Valium [Diazepam] Itching    Can tolerate if necessary    Past Medical History:  Diagnosis Date  . Adenomatous polyp    x1  . ALLERGIC RHINITIS 12/20/2006  . Chronic anxiety   . Chronic back pain 04/14/2007   02/2017 [facility] records reviewed - mild lumbar DDD and L SIJ pain s/p ESI latest 10/2016 with good effect  . Diverticulitis   . GERD (gastroesophageal reflux disease)    intermittent, treated with tums  . Hiatal hernia   . Hypothyroidism   . Insomnia   . Osteoporosis 2007   declined prolia  . Perforation of right tympanic membrane 09/03/2015   Chronic, seen by Dr Janace Hoard ENT   . Personal history of kidney stones    x1  . Postmenopausal atrophic vaginitis 2016   rec estrace cream - [place]  . Recurrent UTI   . Scoliosis   . Severe recurrent depression with psychosis ([place])   . Urinary retention   . Vitamin D deficiency     Past Surgical History:  Procedure Laterality Date  . ABDOMINAL HYSTERECTOMY  1979   ectopic pregnancy with one ovary removed  . APPENDECTOMY  1954  . CATARACT EXTRACTION, BILATERAL  11/2015  . CHOLECYSTECTOMY    . ESOPHAGOGASTRODUODENOSCOPY    . KNEE SURGERY Left 2012   L medial meniscal tear  . OOPHORECTOMY Right 1965   Ectopic pregnancy, removal of right ovary and tube- 1960  . ROTATOR CUFF REPAIR Right   . TONSILLECTOMY      Family History  Problem Relation Age of Onset  . Anxiety disorder Grandchild   . Depression Grandchild   . Cancer Sister        lung  . Healthy Sister   . Diabetes Mother   . Diabetes  Father   . Schizophrenia Sister   . Diabetes Sister   . Diabetes Brother   . Cancer Sister        kidney  . Alcohol abuse Neg Hx   . Bipolar disorder Neg  Hx   . Dementia Neg Hx   . Drug abuse Neg Hx   . OCD Neg Hx   . Paranoid behavior Neg Hx   . Seizures Neg Hx   . Sexual abuse Neg Hx   . Physical abuse Neg Hx     Social History:  reports that she has never smoked. She has never used smokeless tobacco. She reports that she does not drink alcohol or use drugs.  REVIEW Of SYSTEMS:  She has had chronic depression currently taking Effexor, Lamictal and Seroquel  She has had chronic back pain  She had a fracture of her right wrist after a fall   Examination:   BP (!) 142/84 (BP Location: Left Arm, Patient Position: Sitting, Cuff Size: Normal)   Pulse 92   Ht 5\' 4"  (1.626 m)   Wt 167 lb (75.8 kg)   LMP 07/15/2012   SpO2 97%   BMI 28.67 kg/m        Assessments/Treatment:  Hypothyroidism, primary, long-standing and without goiter She has no complaints  She is taking 88 g, 6 tablets weekly of her generic preparation  Again her TSH is low normal Although she thinks she is taking the medication as prescribed she is not giving a good history today  Since she is taking the equivalent of 75 mcg currently will switch her to the 75 mcg prescription using 6-1/2 pills a week  OSTEOPOROSIS with recent wrist fracture:   Would recommend getting a bone density and this will be scheduled Likely to be a candidate for Prolia   VITAMIN D deficiency: Will be given a new prescription since she does not appear to have this refilled    Elayne Snare 02/04/2018, 10:29 AM

## 2018-02-05 ENCOUNTER — Telehealth: Payer: Self-pay

## 2018-02-05 DIAGNOSIS — F22 Delusional disorders: Secondary | ICD-10-CM | POA: Diagnosis not present

## 2018-02-05 DIAGNOSIS — F410 Panic disorder [episodic paroxysmal anxiety] without agoraphobia: Secondary | ICD-10-CM | POA: Diagnosis not present

## 2018-02-05 NOTE — Telephone Encounter (Signed)
Agree. Note she saw Dr Sabra Heck for R radial fracture.

## 2018-02-05 NOTE — Telephone Encounter (Signed)
Jamie Pitts (Jamie Pitts (on [place]) gave permission to talk to Almyra Free) Almyra Free said pt has been in a continuous panic attack for 3 weeks. Pt is presently crying to the point that pt cannot stop crying. Almyra Free does not want to take pt to ED due to waiting and potential for pt getting sick ie the flu. Almyra Free said pt needs med for nerves. Dr Harrington Challenger psychiatrist has prescribed meds for pt for psych reasons. Almyra Free will contact Dr Harrington Challenger and if cannot reach Dr Harrington Challenger advised Almyra Free to take pt to ED. Almyra Free voiced understanding. FYI to Dr Darnell Level. As FYI also pt fell one wk ago and fx arm.

## 2018-02-06 ENCOUNTER — Ambulatory Visit (HOSPITAL_COMMUNITY): Payer: Medicare Other | Admitting: Psychiatry

## 2018-02-17 DIAGNOSIS — S52531A Colles' fracture of right radius, initial encounter for closed fracture: Secondary | ICD-10-CM | POA: Diagnosis not present

## 2018-02-25 ENCOUNTER — Other Ambulatory Visit (HOSPITAL_COMMUNITY): Payer: Self-pay | Admitting: Psychiatry

## 2018-02-25 ENCOUNTER — Telehealth (HOSPITAL_COMMUNITY): Payer: Self-pay | Admitting: *Deleted

## 2018-02-25 MED ORDER — CLONAZEPAM 1 MG PO TABS: 1.0000 mg | ORAL_TABLET | Freq: Every day | ORAL | 2 refills | Status: DC

## 2018-02-25 NOTE — Telephone Encounter (Signed)
Dr Harrington Challenger Patient states she's not happy with the services of  [facility] . And asked if script could be sent to [facility] . Called [facility] & script for Klonopin expired in May 2019. New script needed

## 2018-02-25 NOTE — Telephone Encounter (Signed)
Dr Harrington Challenger Patient called front office requesting medication refills. Next office visit  04/14/2018

## 2018-02-25 NOTE — Telephone Encounter (Signed)
She still has refills at her pharmacy

## 2018-02-25 NOTE — Telephone Encounter (Signed)
sent 

## 2018-03-11 DIAGNOSIS — G894 Chronic pain syndrome: Secondary | ICD-10-CM | POA: Diagnosis not present

## 2018-03-16 DIAGNOSIS — S52531A Colles' fracture of right radius, initial encounter for closed fracture: Secondary | ICD-10-CM | POA: Diagnosis not present

## 2018-04-13 DIAGNOSIS — M545 Low back pain: Secondary | ICD-10-CM | POA: Diagnosis not present

## 2018-04-13 DIAGNOSIS — M5136 Other intervertebral disc degeneration, lumbar region: Secondary | ICD-10-CM | POA: Diagnosis not present

## 2018-04-13 DIAGNOSIS — G894 Chronic pain syndrome: Secondary | ICD-10-CM | POA: Diagnosis not present

## 2018-04-13 DIAGNOSIS — Z79891 Long term (current) use of opiate analgesic: Secondary | ICD-10-CM | POA: Diagnosis not present

## 2018-04-14 ENCOUNTER — Ambulatory Visit (HOSPITAL_COMMUNITY): Payer: Medicare Other | Admitting: Psychiatry

## 2018-04-20 ENCOUNTER — Ambulatory Visit (HOSPITAL_COMMUNITY): Payer: Medicare Other | Admitting: Psychiatry

## 2018-04-22 DIAGNOSIS — G894 Chronic pain syndrome: Secondary | ICD-10-CM | POA: Diagnosis not present

## 2018-04-22 DIAGNOSIS — Z79891 Long term (current) use of opiate analgesic: Secondary | ICD-10-CM | POA: Diagnosis not present

## 2018-04-22 DIAGNOSIS — M5136 Other intervertebral disc degeneration, lumbar region: Secondary | ICD-10-CM | POA: Diagnosis not present

## 2018-04-28 ENCOUNTER — Ambulatory Visit (INDEPENDENT_AMBULATORY_CARE_PROVIDER_SITE_OTHER): Payer: Medicare Other | Admitting: Family Medicine

## 2018-04-28 ENCOUNTER — Encounter: Payer: Self-pay | Admitting: Family Medicine

## 2018-04-28 VITALS — BP 132/78 | HR 77 | Temp 98.4°F | Ht 65.0 in | Wt 171.4 lb

## 2018-04-28 DIAGNOSIS — F333 Major depressive disorder, recurrent, severe with psychotic symptoms: Secondary | ICD-10-CM | POA: Diagnosis not present

## 2018-04-28 DIAGNOSIS — F418 Other specified anxiety disorders: Secondary | ICD-10-CM | POA: Diagnosis not present

## 2018-04-28 DIAGNOSIS — M545 Low back pain, unspecified: Secondary | ICD-10-CM

## 2018-04-28 DIAGNOSIS — F5105 Insomnia due to other mental disorder: Secondary | ICD-10-CM

## 2018-04-28 DIAGNOSIS — G8929 Other chronic pain: Secondary | ICD-10-CM

## 2018-04-28 DIAGNOSIS — G894 Chronic pain syndrome: Secondary | ICD-10-CM | POA: Diagnosis not present

## 2018-04-28 MED ORDER — TRAZODONE HCL 50 MG PO TABS: 25.0000 mg | ORAL_TABLET | Freq: Every evening | ORAL | 3 refills | Status: DC | PRN

## 2018-04-28 NOTE — Assessment & Plan Note (Signed)
Reviewed recent ortho note, discussed lumbar DDD diagnosis. Unclear how clear she is with her dx.

## 2018-04-28 NOTE — Assessment & Plan Note (Signed)
Followed by Dr Nelva Bush. Latest notes reviewed.  [place] CSRS reviewed.

## 2018-04-28 NOTE — Patient Instructions (Addendum)
Continue current medicines.  I recommend trying melatonin sleep hormone 3-5 mg at night time.  If that isn't helpful, we can try medicine called trazodone 50mg  1/2 tablet for first 4 nights then may increase 50mg  full tablet.  Keep appointment with Dr Harrington Challenger.   Sleep hygiene checklist: 1. Avoid naps during the day 2. Avoid stimulants such as caffeine and nicotine. Avoid bedtime alcohol (it can speed onset of sleep but the body's metabolism can cause awakenings). 3. All forms of exercise help ensure sound sleep - limit vigorous exercise to morning or late afternoon 4. Avoid food too close to bedtime including chocolate (which contains caffeine) 5. Soak up natural light 6. Establish regular bedtime routine. 7. Associate bed with sleep - avoid TV, computer or phone, reading while in bed. 8. Ensure pleasant, relaxing sleep environment - quiet, dark, cool room.

## 2018-04-28 NOTE — Assessment & Plan Note (Addendum)
Sleep hygiene measures reviewed.  Seroquel was intolerable.  Initially recommended melatonin - she refused to try this "it won't help, I need something stronger". Discussed reasons not to treat with [place] - including mortality risk of medication and risk of mixing with benzo and narcotic - advised I would not prescribe this.  Will trial trazodone 25-50mg  for sleep. Reviewed side effects to watch for serotonin syndrome with concomitant effexor XR use.  Consider hydroxyzine if no improvement with trazodone, monitoring for anticholinergic effect.  Encouraged she touch base with psych as well.  Will cc Dr Harrington Challenger.

## 2018-04-28 NOTE — Progress Notes (Signed)
BP 132/78 (BP Location: Left Arm, Patient Position: Sitting, Cuff Size: Normal)   Pulse 77   Temp 98.4 F (36.9 C) (Oral)   Ht 5\' 5"  (1.651 m)   Wt 171 lb 7 oz (77.8 kg)   LMP 07/15/2012   SpO2 95%   BMI 28.53 kg/m    CC: discuss sleep Subjective:    Patient ID: Jamie Pitts, female    DOB: August 06, 1933, 85 y.o.   MRN: [id number]  HPI: Jamie Pitts is a 85 y.o. female presenting on 04/28/2018 for Insomnia (Wants to discuss starting sleeping medication. States she was recently dx with spondylitis. )   "I have spondylitis in the spine" treated by Dr Nelva Bush - perseverates on this some.  Having trouble sleeping, eating, and increased anxiety.  Trouble sleeping for the last year, worse the last few months.  Clonazepam 1mg  gives her 2 hours of rest.  She's tried seroquel - endorses it enhances sounds very loudly to the point of not being able to rest, and it caused nausea and malaise "makes me sick". Does not want to take this.  She has tried tylenol PM without benefit, plain benadryl didn't help.  She requests ambien Rx.   Current psych regimen is effexor XR 150mg  daily in am, lamictal 50mg  bid for mood stabilization, clonazepam 1mg  at bedtime. She endorses taking these meds regularly.  Pain management for chronic back pain through Dr Tobie Lords receives hydrocodone 7.5/325mg  QID PRN #120/mo.   On benzo clonazepam 1mg  daily #30/mo through Dr Harrington Challenger psychiatrist.   Upcoming appts with endo and psych this month.  Reviewed latest psych note 12/2017.  [place] CSRS reviewed.   Bedtime routine - Dinner at [employer]. 10 pm bedtime. Trouble with initiating and maintaining sleep. Sleeps a few hours on average. Gets up at 8am. No daytime naps. Uses bed just for sleep.     Relevant past medical, surgical, family and social history reviewed and updated as indicated. Interim medical history since our last visit reviewed. Allergies and medications reviewed and updated. Outpatient Medications  Prior to Visit  Medication Sig Dispense Refill  . Calcium Carbonate-Vitamin D (CALCIUM-D PO) Take 1 tablet by mouth daily.    . celecoxib (CELEBREX) 200 MG capsule Take 1 capsule by mouth daily.    . clonazePAM (KLONOPIN) 1 MG tablet Take 1 tablet (1 mg total) by mouth at bedtime. 30 tablet 2  . dicyclomine (BENTYL) 10 MG capsule Take 1 capsule (10 mg total) by mouth daily as needed for spasms. 90 capsule 1  . HYDROcodone-acetaminophen (NORCO/VICODIN) 5-325 MG tablet Take 1 tablet by mouth every 6 (six) hours as needed for moderate pain.    Marland Kitchen lamoTRIgine (LAMICTAL) 25 MG tablet Take 2 tablets (50 mg total) by mouth 2 (two) times daily. 360 tablet 2  . levothyroxine (SYNTHROID, LEVOTHROID) 75 MCG tablet 1 tablet before breakfast daily except half tablet on Sundays 90 tablet 1  . polyethylene glycol powder (GLYCOLAX/MIRALAX) powder polyethylene glycol 3350 17 gram oral powder packet  TK 17 GMS D    . PROAIR HFA 108 (90 Base) MCG/ACT inhaler USE 2 INHALATIONS EVERY 6 HOURS AS NEEDED FOR COUGH 25.5 g 1  . venlafaxine XR (EFFEXOR-XR) 150 MG 24 hr capsule Take 1 capsule (150 mg total) by mouth daily with breakfast. 90 capsule 2  . ondansetron (ZOFRAN) 4 MG tablet Take 1 tablet (4 mg total) by mouth every 8 (eight) hours as needed for nausea or vomiting. 20 tablet 0  . QUEtiapine (SEROQUEL) 25  MG tablet Take 1 tablet (25 mg total) by mouth 3 (three) times daily. 90 tablet 2  . Vitamin D, Ergocalciferol, (DRISDOL) 1.25 MG (50000 UT) CAPS capsule Take 1 capsule (50,000 Units total) by mouth every 14 (fourteen) days. 6 capsule 1   No facility-administered medications prior to visit.      Per HPI unless specifically indicated in ROS section below Review of Systems Objective:    BP 132/78 (BP Location: Left Arm, Patient Position: Sitting, Cuff Size: Normal)   Pulse 77   Temp 98.4 F (36.9 C) (Oral)   Ht 5\' 5"  (1.651 m)   Wt 171 lb 7 oz (77.8 kg)   LMP 07/15/2012   SpO2 95%   BMI 28.53 kg/m   Wt  Readings from Last 3 Encounters:  04/28/18 171 lb 7 oz (77.8 kg)  02/04/18 167 lb (75.8 kg)  01/22/18 160 lb (72.6 kg)    Physical Exam Vitals signs and nursing note reviewed.  Constitutional:      Appearance: Normal appearance. She is not ill-appearing.  Cardiovascular:     Rate and Rhythm: Normal rate and regular rhythm.     Pulses: Normal pulses.     Heart sounds: Normal heart sounds. No murmur.  Pulmonary:     Effort: Pulmonary effort is normal. No respiratory distress.     Breath sounds: Normal breath sounds. No wheezing, rhonchi or rales.  Neurological:     Mental Status: She is alert.       Results for orders placed or performed in visit on 01/29/18  T4, free  Result Value Ref Range   Free T4 0.90 0.60 - 1.60 ng/dL  TSH  Result Value Ref Range   TSH 0.45 0.35 - 4.50 uIU/mL  VITAMIN D 25 Hydroxy (Vit-D Deficiency, Fractures)  Result Value Ref Range   VITD 34.63 30.00 - 100.00 ng/mL   Depression screen [facility] 2/9 04/28/2018 11/18/2016  Decreased Interest 3 0  Down, Depressed, Hopeless 3 1  PHQ - 2 Score 6 1  Altered sleeping 3 1  Tired, decreased energy 1 0  Change in appetite 1 0  Feeling bad or failure about yourself  0 0  Trouble concentrating 3 0  Moving slowly or fidgety/restless 0 0  Suicidal thoughts 0 0  PHQ-9 Score 14 2  Difficult doing work/chores - Not difficult at all    GAD 7 : Generalized Anxiety Score 04/28/2018  Nervous, Anxious, on Edge 3  Control/stop worrying 2  Worry too much - different things 2  Trouble relaxing 3  Restless 1  Easily annoyed or irritable 1  Afraid - awful might happen 1  Total GAD 7 Score 13    Assessment & Plan:  Discussed return for medicare wellness visit as overdue.  Problem List Items Addressed This Visit    Insomnia secondary to depression with anxiety - Primary (Chronic)    Sleep hygiene measures reviewed.  Seroquel was intolerable.  Initially recommended melatonin - she refused to try this "it won't help, I  need something stronger". Discussed reasons not to treat with [place] - including mortality risk of medication and risk of mixing with benzo and narcotic - advised I would not prescribe this.  Will trial trazodone 25-50mg  for sleep. Reviewed side effects to watch for serotonin syndrome with concomitant effexor XR use.  Consider hydroxyzine if no improvement with trazodone, monitoring for anticholinergic effect.  Encouraged she touch base with psych as well.  Will cc Dr Harrington Challenger.  Relevant Medications   traZODone (DESYREL) 50 MG tablet   Depression, major, recurrent, severe with psychosis ([place])    Continue current treatment regimen.       Relevant Medications   traZODone (DESYREL) 50 MG tablet   Chronic pain syndrome    Followed by Dr Nelva Bush. Latest notes reviewed.  [place] CSRS reviewed.       Chronic low back pain    Reviewed recent ortho note, discussed lumbar DDD diagnosis. Unclear how clear she is with her dx.       Relevant Medications   celecoxib (CELEBREX) 200 MG capsule       Meds ordered this encounter  Medications  . traZODone (DESYREL) 50 MG tablet    Sig: Take 0.5-1 tablets (25-50 mg total) by mouth at bedtime as needed for sleep.    Dispense:  30 tablet    Refill:  3   No orders of the defined types were placed in this encounter.   Patient Instructions  Continue current medicines.  I recommend trying melatonin sleep hormone 3-5 mg at night time.  If that isn't helpful, we can try medicine called trazodone 50mg  1/2 tablet for first 4 nights then may increase 50mg  full tablet.  Keep appointment with Dr Harrington Challenger.   Sleep hygiene checklist: 1. Avoid naps during the day 2. Avoid stimulants such as caffeine and nicotine. Avoid bedtime alcohol (it can speed onset of sleep but the body's metabolism can cause awakenings). 3. All forms of exercise help ensure sound sleep - limit vigorous exercise to morning or late afternoon 4. Avoid food too close to bedtime including  chocolate (which contains caffeine) 5. Soak up natural light 6. Establish regular bedtime routine. 7. Associate bed with sleep - avoid TV, computer or phone, reading while in bed. 8. Ensure pleasant, relaxing sleep environment - quiet, dark, cool room.    Follow up plan: No follow-ups on file.  Ria Bush, MD

## 2018-04-28 NOTE — Assessment & Plan Note (Signed)
-  Continue current treatment regimen

## 2018-05-04 ENCOUNTER — Other Ambulatory Visit: Payer: Self-pay

## 2018-05-04 ENCOUNTER — Other Ambulatory Visit (INDEPENDENT_AMBULATORY_CARE_PROVIDER_SITE_OTHER): Payer: Medicare Other

## 2018-05-04 DIAGNOSIS — E063 Autoimmune thyroiditis: Secondary | ICD-10-CM

## 2018-05-04 DIAGNOSIS — E559 Vitamin D deficiency, unspecified: Secondary | ICD-10-CM | POA: Diagnosis not present

## 2018-05-04 DIAGNOSIS — M810 Age-related osteoporosis without current pathological fracture: Secondary | ICD-10-CM | POA: Diagnosis not present

## 2018-05-04 LAB — BASIC METABOLIC PANEL
BUN: 19 mg/dL (ref 6–23)
CO2: 25 meq/L (ref 19–32)
Calcium: 9.6 mg/dL (ref 8.4–10.5)
Chloride: 98 mEq/L (ref 96–112)
Creatinine, Ser: 1.2 mg/dL (ref 0.40–1.20)
GFR: 42.95 mL/min — ABNORMAL LOW (ref >60.00)
GLUCOSE: 92 mg/dL (ref 70–99)
Potassium: 4.9 mEq/L (ref 3.5–5.1)
Sodium: 131 mEq/L — ABNORMAL LOW (ref 135–145)

## 2018-05-04 LAB — VITAMIN D 25 HYDROXY (VIT D DEFICIENCY, FRACTURES): VITD: 43.47 ng/mL (ref 30.00–100.00)

## 2018-05-04 LAB — T4, FREE: Free T4: 0.81 ng/dL (ref 0.60–1.60)

## 2018-05-04 LAB — TSH: TSH: 3.24 u[IU]/mL (ref 0.35–4.50)

## 2018-05-06 ENCOUNTER — Ambulatory Visit: Payer: Self-pay | Admitting: Endocrinology

## 2018-05-12 ENCOUNTER — Ambulatory Visit (INDEPENDENT_AMBULATORY_CARE_PROVIDER_SITE_OTHER): Payer: Medicare Other | Admitting: Psychiatry

## 2018-05-12 ENCOUNTER — Other Ambulatory Visit: Payer: Self-pay

## 2018-05-12 ENCOUNTER — Encounter (HOSPITAL_COMMUNITY): Payer: Self-pay | Admitting: Psychiatry

## 2018-05-12 DIAGNOSIS — F341 Dysthymic disorder: Secondary | ICD-10-CM | POA: Diagnosis not present

## 2018-05-12 DIAGNOSIS — Z79899 Other long term (current) drug therapy: Secondary | ICD-10-CM | POA: Diagnosis not present

## 2018-05-12 MED ORDER — LAMOTRIGINE 25 MG PO TABS: 50.0000 mg | ORAL_TABLET | Freq: Two times a day (BID) | ORAL | 2 refills | Status: DC

## 2018-05-12 MED ORDER — VENLAFAXINE HCL ER 75 MG PO CP24: 75.0000 mg | ORAL_CAPSULE | Freq: Every day | ORAL | 2 refills | Status: DC

## 2018-05-12 MED ORDER — CLONAZEPAM 1 MG PO TABS: 1.0000 mg | ORAL_TABLET | Freq: Every day | ORAL | 2 refills | Status: DC

## 2018-05-12 NOTE — Progress Notes (Signed)
Virtual Visit via Telephone Note  I connected with Jamie Pitts on 05/12/18 at  2:40 PM EDT by telephone and verified that I am speaking with the correct person using two identifiers.   I discussed the limitations, risks, security and privacy concerns of performing an evaluation and management service by telephone and the availability of in person appointments. I also discussed with the patient that there may be a patient responsible charge related to this service. The patient expressed understanding and agreed to proceed   I discussed the assessment and treatment plan with the patient. The patient was provided an opportunity to ask questions and all were answered. The patient agreed with the plan and demonstrated an understanding of the instructions.   The patient was advised to call back or seek an in-person evaluation if the symptoms worsen or if the condition fails to improve as anticipated.  I provided 15 minutes of non-face-to-face time during this encounter.   Levonne Spiller, MD  [facility] MD/PA/NP OP Progress Note  05/12/2018 3:24 PM Jamie Pitts  MRN:  [id number]  Chief Complaint:  Chief Complaint    Depression; Hallucinations; Follow-up     HPI: This patient is a 85 year old married white female who lives with her husband in [place]. She has one daughter and 3 grandchildren. She is retired from the [employer].  Apparently the patient had some sort of psychotic episode in 2013. She was admitted to [facility] because she was hearing voices at home. The voices got so bad that she cannot put a gun under the bed and wanted to shoot him. The patient is a poor historian and is hard of hearing and gets easily confused. She was placed on various medicines in the hospital which have been continued. It's unclear if she is medication compliant. For example she's not taking the Cogentin. She repeats herself a lot.  The patient was evaluated today via telephone virtual visit due  to the coronavirus pandemic.  She states that right now she is doing fairly well.  She states that however for about 6 weeks she went through a very dark time was depressed not getting out of bed having auditory hallucinations but she did not contact me.  She claims she "did not have the energy to do much of anything."  She claims she was unable to eat or sleep during those weeks but she is doing much better now.  She states a lot of it had to do with her "spinal pain" and Dr. Nelva Bush has helped her with this and she is in much better spirits.  She was having auditory hallucinations but claims that she is not having them now.  She is sleeping well as long as she takes the clonazepam at bedtime.  She would like to go back down on the Effexor because she thinks it is making her sweat too much and may be causing too much activation and I am willing to do so.  She is currently not on any antipsychotic and does not want anymore and states that she is not having the hallucinations right now.  For the most part she is making sense and is logical and coherent.  She denies severe depression suicidal ideation current hallucinations or delusions or thoughts of self-harm or harm to others. Visit Diagnosis:    ICD-10-CM   1. ANXIETY DEPRESSION F34.1 lamoTRIgine (LAMICTAL) 25 MG tablet    Past Psychiatric History: Hospitalization in 2013 for auditory hallucinations and depression  Past Medical History:  Past  Medical History:  Diagnosis Date  . Adenomatous polyp    x1  . ALLERGIC RHINITIS 12/20/2006  . Chronic anxiety   . Chronic back pain 04/14/2007   02/2017 Nelva Bush records reviewed - mild lumbar DDD and L SIJ pain s/p ESI latest 10/2016 with good effect  . Diverticulitis   . GERD (gastroesophageal reflux disease)    intermittent, treated with tums  . Hiatal hernia   . Hypothyroidism   . Insomnia   . Osteoporosis 2007   declined prolia  . Perforation of right tympanic membrane 09/03/2015   Chronic, seen by Dr  Janace Hoard ENT   . Personal history of kidney stones    x1  . Postmenopausal atrophic vaginitis 2016   rec estrace cream - [place]  . Recurrent UTI   . Scoliosis   . Severe recurrent depression with psychosis ([place])   . Urinary retention   . Vitamin D deficiency     Past Surgical History:  Procedure Laterality Date  . ABDOMINAL HYSTERECTOMY  1979   ectopic pregnancy with one ovary removed  . APPENDECTOMY  1954  . CATARACT EXTRACTION, BILATERAL  11/2015  . CHOLECYSTECTOMY    . ESOPHAGOGASTRODUODENOSCOPY    . KNEE SURGERY Left 2012   L medial meniscal tear  . OOPHORECTOMY Right 1965   Ectopic pregnancy, removal of right ovary and tube- 1960  . ROTATOR CUFF REPAIR Right   . TONSILLECTOMY      Family Psychiatric History: See below  Family History:  Family History  Problem Relation Age of Onset  . Anxiety disorder Grandchild   . Depression Grandchild   . Cancer Sister        lung  . Healthy Sister   . Diabetes Mother   . Diabetes Father   . Schizophrenia Sister   . Diabetes Sister   . Diabetes Brother   . Cancer Sister        kidney  . Alcohol abuse Neg Hx   . Bipolar disorder Neg Hx   . Dementia Neg Hx   . Drug abuse Neg Hx   . OCD Neg Hx   . Paranoid behavior Neg Hx   . Seizures Neg Hx   . Sexual abuse Neg Hx   . Physical abuse Neg Hx     Social History:  Social History   Socioeconomic History  . Marital status: Married    Spouse name: Not on file  . Number of children: 1  . Years of education: Not on file  . Highest education level: Not on file  Occupational History  . Occupation: retired Market researcher  Social Needs  . Financial resource strain: Patient refused  . Food insecurity:    Worry: Patient refused    Inability: Patient refused  . Transportation needs:    Medical: Patient refused    Non-medical: Patient refused  Tobacco Use  . Smoking status: Never Smoker  . Smokeless tobacco: Never Used  Substance and Sexual Activity  . Alcohol use: No  .  Drug use: No  . Sexual activity: Not Currently    Birth control/protection: Diaphragm  Lifestyle  . Physical activity:    Days per week: Patient refused    Minutes per session: Patient refused  . Stress: Patient refused  Relationships  . Social connections:    Talks on phone: Patient refused    Gets together: Patient refused    Attends religious service: Patient refused    Active member of club or organization: Patient refused  Attends meetings of clubs or organizations: Patient refused    Relationship status: Patient refused  Other Topics Concern  . Not on file  Social History Narrative   Lives with husband and grandson [name])   Occ: retired, Sales executive   Edu: College          Allergies:  Allergies  Allergen Reactions  . Amoxicillin Hives    Sores in mouth  . Buprenorphine Hcl Itching    Can tolerate if necessary Can tolerate if necessary Can tolerate if necessary  . Morphine Itching    Can tolerate if necessary  . Morphine And Related Itching    Can tolerate if necessary  . Sulfa Antibiotics Hives    Sores in mouth Sores in mouth   . Sulfonamide Derivatives Hives    Sores in mouth  . Valium [Diazepam] Itching    Can tolerate if necessary    Metabolic Disorder Labs: No results found for: HGBA1C, MPG No results found for: PROLACTIN Lab Results  Component Value Date   CHOL 208 (H) 11/18/2016   TRIG 248.0 (H) 11/18/2016   HDL 43.10 11/18/2016   CHOLHDL 5 11/18/2016   VLDL 49.6 (H) 11/18/2016   Lab Results  Component Value Date   TSH 3.24 05/04/2018   TSH 0.45 01/29/2018    Therapeutic Level Labs: No results found for: LITHIUM No results found for: VALPROATE No components found for:  CBMZ  Current Medications: Current Outpatient Medications  Medication Sig Dispense Refill  . Calcium Carbonate-Vitamin D (CALCIUM-D PO) Take 1 tablet by mouth daily.    . celecoxib (CELEBREX) 200 MG capsule Take 1 capsule by mouth daily.    . clonazePAM  (KLONOPIN) 1 MG tablet Take 1 tablet (1 mg total) by mouth at bedtime. 30 tablet 2  . dicyclomine (BENTYL) 10 MG capsule Take 1 capsule (10 mg total) by mouth daily as needed for spasms. 90 capsule 1  . HYDROcodone-acetaminophen (NORCO/VICODIN) 5-325 MG tablet Take 1 tablet by mouth every 6 (six) hours as needed for moderate pain.    Marland Kitchen lamoTRIgine (LAMICTAL) 25 MG tablet Take 2 tablets (50 mg total) by mouth 2 (two) times daily. 360 tablet 2  . levothyroxine (SYNTHROID, LEVOTHROID) 75 MCG tablet 1 tablet before breakfast daily except half tablet on Sundays 90 tablet 1  . polyethylene glycol powder (GLYCOLAX/MIRALAX) powder polyethylene glycol 3350 17 gram oral powder packet  TK 17 GMS D    . PROAIR HFA 108 (90 Base) MCG/ACT inhaler USE 2 INHALATIONS EVERY 6 HOURS AS NEEDED FOR COUGH 25.5 g 1  . venlafaxine XR (EFFEXOR XR) 75 MG 24 hr capsule Take 1 capsule (75 mg total) by mouth daily. 90 capsule 2   No current facility-administered medications for this visit.      Musculoskeletal: Strength & Muscle Tone could not be assessed, telephone interview Gait & Station: Patient leans:   Psychiatric Specialty Exam: Review of Systems  Musculoskeletal: Positive for back pain.  All other systems reviewed and are negative.   Last menstrual period 07/15/2012.There is no height or weight on file to calculate BMI.  General Appearance: NA  Eye Contact:  NA  Speech:  Clear and Coherent  Volume:  Normal  Mood:  Euthymic  Affect:  NA  Thought Process:  Goal Directed  Orientation:  Full (Time, Place, and Person)  Thought Content: Rumination   Suicidal Thoughts:  No  Homicidal Thoughts:  No  Memory:  Immediate;   Good Recent;   Good Remote;   Fair  Judgement:  Fair  Insight:  Shallow  Psychomotor Activity:  Decreased  Concentration:  Concentration: Fair and Attention Span: Fair  Recall:  [employer] of Knowledge: Fair  Language: Good  Akathisia:  No  Handed:  Right  AIMS (if indicated): not  done  Assets:  Communication Skills Desire for Improvement Resilience Social Support Talents/Skills  ADL's:  Intact  Cognition: WNL  Sleep:  Fair   Screenings: GAD-7     Office Visit from 04/28/2018 in [place] at [place] from 11/18/2016 in [place] at [facility]  Total Score (max 30 points )  20    PHQ2-9     Office Visit from 04/28/2018 in [place] at [place] from 11/18/2016 in [place] at [facility] Total Score  6  1  PHQ-9 Total Score  14  2       Assessment and Plan: This patient is an 85 year old female with a history of what seems to be psychotic depression.  She claims that she had a "breakdown" recently but now has come out of it.  She attributes this to the improvement in her pain level.  It is difficult to tell what is really going on because she often contradicts herself but for right now she thinks she is doing well on her current regimen even though she is on no antipsychotics.  She will continue clonazepam 1 mg at bedtime for anxiety and sleep, Lamictal 50 mg twice daily for mood stabilization.  She would like to go back to Effexor XR 75 mg daily for depression as the higher dose was causing sweating.  She will return to see me in 3 months or call sooner as needed   Levonne Spiller, MD 05/12/2018, 3:24 PM

## 2018-05-13 ENCOUNTER — Other Ambulatory Visit: Payer: Self-pay

## 2018-05-14 ENCOUNTER — Ambulatory Visit (INDEPENDENT_AMBULATORY_CARE_PROVIDER_SITE_OTHER): Payer: Medicare Other | Admitting: Endocrinology

## 2018-05-14 ENCOUNTER — Encounter: Payer: Self-pay | Admitting: Endocrinology

## 2018-05-14 ENCOUNTER — Other Ambulatory Visit: Payer: Self-pay

## 2018-05-14 VITALS — BP 140/80 | HR 80 | Ht 64.0 in | Wt 172.6 lb

## 2018-05-14 DIAGNOSIS — E063 Autoimmune thyroiditis: Secondary | ICD-10-CM | POA: Diagnosis not present

## 2018-05-14 DIAGNOSIS — E871 Hypo-osmolality and hyponatremia: Secondary | ICD-10-CM

## 2018-05-14 DIAGNOSIS — M810 Age-related osteoporosis without current pathological fracture: Secondary | ICD-10-CM | POA: Diagnosis not present

## 2018-05-14 DIAGNOSIS — E559 Vitamin D deficiency, unspecified: Secondary | ICD-10-CM | POA: Diagnosis not present

## 2018-05-14 NOTE — Progress Notes (Signed)
Patient ID: Jamie Pitts, female   DOB: October 15, 1933, 85 y.o.   MRN: [id number]    Reason for Appointment:  followup visit    History of Present Illness:   HYPOTHYROIDISM: This was first diagnosed in 1980 This was diagnosed when she had symptoms of fatigue and hair loss No history of goiter  She has been on generic Synthroid and has been on somewhat variable doses, either 75 or 88 mcg  In 2019 her TSH has been low or low normal. Because of this her levothyroxine was reduced down to 75 mcg, 6-1/2 tablets a week Although the patient was a little confused on her previous visit she is able to get a better history today and her husband is also present  She feels quite well and does not complain of any unusual fatigue She is taking her prescription as directed with 1/2 tablet on Sundays Her weight has gradually gone up  She is taking the levothyroxine consistently in the morning before breakfast.        Wt Readings from Last 3 Encounters:  05/14/18 172 lb 9.6 oz (78.3 kg)  04/28/18 171 lb 7 oz (77.8 kg)  02/04/18 167 lb (75.8 kg)    TSH is back to normal    Lab Results  Component Value Date   TSH 3.24 05/04/2018   TSH 0.45 01/29/2018   TSH 0.147 (L) 05/18/2017   FREET4 0.81 05/04/2018   FREET4 0.90 01/29/2018   FREET4 0.89 11/13/2016     OSTEOPOROSIS:  She has had asymptomatic osteoporosis, diagnosed probably in 1997  At that time she was treated with bisphosphonates, probably Fosamax for about 7 years  Was taking Fosamax in 2006 and Actonel in 2007, not clear how long she continued Actonel  She had a T score of -2.8 in 2007 Follow-up bone density in 7/15 indicated lowest T score is -1.9 at the spine, normal at the hip  She has not had a follow-up bone density subsequently, she was supposed to have had this scheduled on her last visit but she did not want to go to the same location as before  She appears to have lost 1 inch in height  She also has had  vitamin D deficiency  She is only taking calcium and vitamin D supplement and a combination tablet currently  Vitamin D level is back to normal even without any prescription vitamin D   Lab Results  Component Value Date   VD25OH 43.47 05/04/2018   VD25OH 34.63 01/29/2018   VD25OH 35.70 12/26/2015     Allergies as of 05/14/2018      Reactions   Amoxicillin Hives   Sores in mouth   Buprenorphine Hcl Itching   Can tolerate if necessary Can tolerate if necessary Can tolerate if necessary   Morphine Itching   Can tolerate if necessary   Morphine And Related Itching   Can tolerate if necessary   Sulfa Antibiotics Hives   Sores in mouth Sores in mouth   Sulfonamide Derivatives Hives   Sores in mouth   Valium [diazepam] Itching   Can tolerate if necessary      Medication List       Accurate as of May 14, 2018 11:59 PM. Always use your most recent med list.        CALCIUM-D PO Take 1 tablet by mouth daily.   CeleBREX 200 MG capsule Generic drug:  celecoxib Take 1 capsule by mouth daily.   clonazePAM 1 MG tablet Commonly  known as:  KlonoPIN Take 1 tablet (1 mg total) by mouth at bedtime.   dicyclomine 10 MG capsule Commonly known as:  BENTYL Take 1 capsule (10 mg total) by mouth daily as needed for spasms.   HYDROcodone-acetaminophen 5-325 MG tablet Commonly known as:  NORCO/VICODIN Take 1 tablet by mouth every 6 (six) hours as needed for moderate pain.   lamoTRIgine 25 MG tablet Commonly known as:  LAMICTAL Take 2 tablets (50 mg total) by mouth 2 (two) times daily.   levothyroxine 75 MCG tablet Commonly known as:  SYNTHROID, LEVOTHROID 1 tablet before breakfast daily except half tablet on Sundays   polyethylene glycol powder powder Commonly known as:  GLYCOLAX/MIRALAX polyethylene glycol 3350 17 gram oral powder packet  TK 17 GMS D   ProAir HFA 108 (90 Base) MCG/ACT inhaler Generic drug:  albuterol USE 2 INHALATIONS EVERY 6 HOURS AS NEEDED FOR COUGH    venlafaxine XR 75 MG 24 hr capsule Commonly known as:  Effexor XR Take 1 capsule (75 mg total) by mouth daily.       Allergies:  Allergies  Allergen Reactions  . Amoxicillin Hives    Sores in mouth  . Buprenorphine Hcl Itching    Can tolerate if necessary Can tolerate if necessary Can tolerate if necessary  . Morphine Itching    Can tolerate if necessary  . Morphine And Related Itching    Can tolerate if necessary  . Sulfa Antibiotics Hives    Sores in mouth Sores in mouth   . Sulfonamide Derivatives Hives    Sores in mouth  . Valium [Diazepam] Itching    Can tolerate if necessary    Past Medical History:  Diagnosis Date  . Adenomatous polyp    x1  . ALLERGIC RHINITIS 12/20/2006  . Chronic anxiety   . Chronic back pain 04/14/2007   02/2017 Nelva Bush records reviewed - mild lumbar DDD and L SIJ pain s/p ESI latest 10/2016 with good effect  . Diverticulitis   . GERD (gastroesophageal reflux disease)    intermittent, treated with tums  . Hiatal hernia   . Hypothyroidism   . Insomnia   . Osteoporosis 2007   declined prolia  . Perforation of right tympanic membrane 09/03/2015   Chronic, seen by Dr Janace Hoard ENT   . Personal history of kidney stones    x1  . Postmenopausal atrophic vaginitis 2016   rec estrace cream - [place]  . Recurrent UTI   . Scoliosis   . Severe recurrent depression with psychosis ([place])   . Urinary retention   . Vitamin D deficiency     Past Surgical History:  Procedure Laterality Date  . ABDOMINAL HYSTERECTOMY  1979   ectopic pregnancy with one ovary removed  . APPENDECTOMY  1954  . CATARACT EXTRACTION, BILATERAL  11/2015  . CHOLECYSTECTOMY    . ESOPHAGOGASTRODUODENOSCOPY    . KNEE SURGERY Left 2012   L medial meniscal tear  . OOPHORECTOMY Right 1965   Ectopic pregnancy, removal of right ovary and tube- 1960  . ROTATOR CUFF REPAIR Right   . TONSILLECTOMY      Family History  Problem Relation Age of Onset  . Anxiety disorder  Grandchild   . Depression Grandchild   . Cancer Sister        lung  . Healthy Sister   . Diabetes Mother   . Diabetes Father   . Schizophrenia Sister   . Diabetes Sister   . Diabetes Brother   .  Cancer Sister        kidney  . Alcohol abuse Neg Hx   . Bipolar disorder Neg Hx   . Dementia Neg Hx   . Drug abuse Neg Hx   . OCD Neg Hx   . Paranoid behavior Neg Hx   . Seizures Neg Hx   . Sexual abuse Neg Hx   . Physical abuse Neg Hx     Social History:  reports that she has never smoked. She has never used smokeless tobacco. She reports that she does not drink alcohol or use drugs.  REVIEW Of SYSTEMS:  She has had chronic depression usually taking multiple medications  She has had chronic back pain  She appears to have a low sodium: Not on any medications that could cause this Her husband thinks that she drinks a lot of water  Lab Results  Component Value Date   NA 131 (L) 05/04/2018   K 4.9 05/04/2018   CL 98 05/04/2018   CO2 25 05/04/2018      Examination:   BP 140/80 (BP Location: Left Arm, Patient Position: Sitting, Cuff Size: Normal)   Pulse 80   Ht 5\' 4"  (1.626 m)   Wt 172 lb 9.6 oz (78.3 kg)   LMP 07/15/2012   SpO2 96%   BMI 29.63 kg/m   No pallor No swelling of her hands or eyes No pedal edema present    Assessments/Treatment:  Hypothyroidism, primary, long-standing and without goiter Her levothyroxine requirement appears to be reduced in 2019 and now TSH is back to normal with taking 75 mcg, 6-1/2 tablets a week She feels subjectively fairly good Is taking her levothyroxine as prescribed consistently  TSH is 3.2 which is adequate considering her age   Although she thinks she does better with the brand-name Synthroid this has been expensive for her Reassured her that since her thyroid levels are now fairly good we will wait until next visit to decide on brand name versus levothyroxine  Follow-up in 6 months  OSTEOPOROSIS with height loss and  history of wrist fracture:   She needs a bone density and this will be scheduled Likely to be a candidate for Prolia  VITAMIN D deficiency: Currently replenished with only the vitamin D and her calcium tablet and will continue  HYPONATREMIA: This is relatively new and not clear of the etiology.  Likely asymptomatic Since she tends to have relatively high water intake she will need to cut back on her fluids by one third and this was discussed  Total visit time for evaluation and management of multiple problems and counseling =25 minutes   Elayne Snare 05/15/2018, 8:58 AM

## 2018-05-22 ENCOUNTER — Other Ambulatory Visit (HOSPITAL_COMMUNITY): Payer: Self-pay | Admitting: Psychiatry

## 2018-05-22 ENCOUNTER — Telehealth (HOSPITAL_COMMUNITY): Payer: Self-pay | Admitting: *Deleted

## 2018-05-22 DIAGNOSIS — M5136 Other intervertebral disc degeneration, lumbar region: Secondary | ICD-10-CM | POA: Diagnosis not present

## 2018-05-22 MED ORDER — CLONAZEPAM 1 MG PO TABS: ORAL_TABLET | ORAL | 2 refills | Status: DC

## 2018-05-22 NOTE — Telephone Encounter (Signed)
Spoke to pt, clonazepam dosage increased

## 2018-05-22 NOTE — Telephone Encounter (Signed)
Dr Harrington Challenger Patient Called front office this AM requesting to speak with you. I received the call & patient asked to let you she  needs /wants Valium. She stated "she's having trouble with her nerves & trouble settleing down". Also said that she's not allergic to Valium unless she's on it more than a year she begins to have reactions.

## 2018-06-18 ENCOUNTER — Other Ambulatory Visit: Payer: Self-pay

## 2018-06-18 MED ORDER — LEVOTHYROXINE SODIUM 75 MCG PO TABS: ORAL_TABLET | ORAL | 1 refills | Status: DC

## 2018-07-16 ENCOUNTER — Telehealth (HOSPITAL_COMMUNITY): Payer: Self-pay | Admitting: *Deleted

## 2018-07-16 NOTE — Telephone Encounter (Signed)
Son called back scheduled appt 7/29 @ 11:20 AM

## 2018-07-16 NOTE — Telephone Encounter (Signed)
Dr Harrington Challenger Patient left VM @ front office & I called to follow up she said she needed a refill on her Clonazepam & I told her she has a refill @ the [facility]. Then she said I've been having a rough time & Dr Harrington Challenger told me " not ot go thru this by myself that I could call her & she would talk to me". I said well let's schedule you now for your 3 month f/u from your March appt.  She's starts yelling telling me " you're not listening I can't come there I can't come out the the house" .  I tried to explain the telephone visit process & she said I want to talk to Dr Harrington Challenger. I said well she's doing other patient's & we can get you on her schedule. " She yelled & told me I was rude & that I don't understand". I told her I was sorry , I wasn't trying to upset her & that I would be hanging up the phone now.

## 2018-07-16 NOTE — Telephone Encounter (Signed)
Give her time to cool down and then call her back (or ask [name]) to schedule phone visit

## 2018-07-17 ENCOUNTER — Encounter (HOSPITAL_COMMUNITY): Payer: Self-pay | Admitting: Psychiatry

## 2018-07-17 ENCOUNTER — Other Ambulatory Visit: Payer: Self-pay

## 2018-07-17 ENCOUNTER — Ambulatory Visit (INDEPENDENT_AMBULATORY_CARE_PROVIDER_SITE_OTHER): Payer: Medicare Other | Admitting: Psychiatry

## 2018-07-17 DIAGNOSIS — F341 Dysthymic disorder: Secondary | ICD-10-CM

## 2018-07-17 DIAGNOSIS — F29 Unspecified psychosis not due to a substance or known physiological condition: Secondary | ICD-10-CM | POA: Diagnosis not present

## 2018-07-17 MED ORDER — LAMOTRIGINE 25 MG PO TABS: 50.0000 mg | ORAL_TABLET | Freq: Two times a day (BID) | ORAL | 2 refills | Status: DC

## 2018-07-17 MED ORDER — VENLAFAXINE HCL ER 75 MG PO CP24: 75.0000 mg | ORAL_CAPSULE | Freq: Every day | ORAL | 2 refills | Status: DC

## 2018-07-17 MED ORDER — CLONAZEPAM 1 MG PO TABS: ORAL_TABLET | ORAL | 2 refills | Status: DC

## 2018-07-17 NOTE — Progress Notes (Signed)
Virtual Visit via Telephone Note  I connected with Jamie Pitts on 07/17/18 at 11:20 AM EDT by telephone and verified that I am speaking with the correct person using two identifiers.   I discussed the limitations, risks, security and privacy concerns of performing an evaluation and management service by telephone and the availability of in person appointments. I also discussed with the patient that there may be a patient responsible charge related to this service. The patient expressed understanding and agreed to proceed.       I discussed the assessment and treatment plan with the patient. The patient was provided an opportunity to ask questions and all were answered. The patient agreed with the plan and demonstrated an understanding of the instructions.   The patient was advised to call back or seek an in-person evaluation if the symptoms worsen or if the condition fails to improve as anticipated.  I provided 15 minutes of non-face-to-face time during this encounter.   Levonne Spiller, MD  [facility] MD/PA/NP OP Progress Note  07/17/2018 10:57 AM Jamie Pitts  MRN:  [id number]  Chief Complaint:  Chief Complaint    Depression; Anxiety; Hallucinations; Follow-up     HPI: This patient is a 85 year old married white female who lives with her husband in [place]. She has one daughter and 3 grandchildren. She is retired from the [employer].  Apparently the patient had some sort of psychotic episode in 2013. She was admitted to [facility] because she was hearing voices at home. The voices got so bad that she cannot put a gun under the bed and wanted to shoot him. The patient is a poor historian and is hard of hearing and gets easily confused. She was placed on various medicines in the hospital which have been continued. It's unclear if she is medication compliant. For example she's not taking the Cogentin. She repeats herself a lot.  The patient is seen as a work in today as  she called the office yesterday in an agitated state.  She was last seen on 05/12/2018 through phone visit and this is also a phone visit due to the coronavirus pandemic.  The patient states that her brother died 2 weeks ago and this is been very distressing to her.  She states that they were very close that he died of cancer.  Since then she has been unable to sleep and she is taking 2 of the 1 mg clonazepam's at bedtime.  She claims that she "ran out" even though there are refills at [facility] for her.  She is not been able to sleep is very agitated and states that she is hearing voices again that are making fun of her.  I suggested that she go back on an antipsychotic but she was adamantly against this claiming she had too many side effects with all of them and she just wants to be able to take the 2 clonazepam's at night and may be something during the day to help "my nerves."  She states that she is still tired that she is not able to function.  She tends to ramble and get confused.  She states that her husband is there with her and she does not have any thoughts of harming herself or others.  She is taking the Effexor and the Lamictal. Visit Diagnosis:    ICD-10-CM   1. Psychosis, unspecified psychosis type ([place]) F29   2. ANXIETY DEPRESSION F34.1 lamoTRIgine (LAMICTAL) 25 MG tablet    Past Psychiatric History: Hospitalization  in 2013 for auditory hallucinations and depression  Past Medical History:  Past Medical History:  Diagnosis Date  . Adenomatous polyp    x1  . ALLERGIC RHINITIS 12/20/2006  . Chronic anxiety   . Chronic back pain 04/14/2007   02/2017 [facility] records reviewed - mild lumbar DDD and L SIJ pain s/p ESI latest 10/2016 with good effect  . Diverticulitis   . GERD (gastroesophageal reflux disease)    intermittent, treated with tums  . Hiatal hernia   . Hypothyroidism   . Insomnia   . Osteoporosis 2007   declined prolia  . Perforation of right tympanic membrane 09/03/2015    Chronic, seen by Dr Janace Hoard ENT   . Personal history of kidney stones    x1  . Postmenopausal atrophic vaginitis 2016   rec estrace cream - [place]  . Recurrent UTI   . Scoliosis   . Severe recurrent depression with psychosis ([place])   . Urinary retention   . Vitamin D deficiency     Past Surgical History:  Procedure Laterality Date  . ABDOMINAL HYSTERECTOMY  1979   ectopic pregnancy with one ovary removed  . APPENDECTOMY  1954  . CATARACT EXTRACTION, BILATERAL  11/2015  . CHOLECYSTECTOMY    . ESOPHAGOGASTRODUODENOSCOPY    . KNEE SURGERY Left 2012   L medial meniscal tear  . OOPHORECTOMY Right 1965   Ectopic pregnancy, removal of right ovary and tube- 1960  . ROTATOR CUFF REPAIR Right   . TONSILLECTOMY      Family Psychiatric History see below  Family History:  Family History  Problem Relation Age of Onset  . Anxiety disorder Grandchild   . Depression Grandchild   . Cancer Sister        lung  . Healthy Sister   . Diabetes Mother   . Diabetes Father   . Schizophrenia Sister   . Diabetes Sister   . Diabetes Brother   . Cancer Sister        kidney  . Alcohol abuse Neg Hx   . Bipolar disorder Neg Hx   . Dementia Neg Hx   . Drug abuse Neg Hx   . OCD Neg Hx   . Paranoid behavior Neg Hx   . Seizures Neg Hx   . Sexual abuse Neg Hx   . Physical abuse Neg Hx     Social History:  Social History   Socioeconomic History  . Marital status: Married    Spouse name: Not on file  . Number of children: 1  . Years of education: Not on file  . Highest education level: Not on file  Occupational History  . Occupation: retired Market researcher  Social Needs  . Financial resource strain: Patient refused  . Food insecurity:    Worry: Patient refused    Inability: Patient refused  . Transportation needs:    Medical: Patient refused    Non-medical: Patient refused  Tobacco Use  . Smoking status: Never Smoker  . Smokeless tobacco: Never Used  Substance and Sexual Activity  .  Alcohol use: No  . Drug use: No  . Sexual activity: Not Currently    Birth control/protection: Diaphragm  Lifestyle  . Physical activity:    Days per week: Patient refused    Minutes per session: Patient refused  . Stress: Patient refused  Relationships  . Social connections:    Talks on phone: Patient refused    Gets together: Patient refused    Attends religious service: Patient  refused    Active member of club or organization: Patient refused    Attends meetings of clubs or organizations: Patient refused    Relationship status: Patient refused  Other Topics Concern  . Not on file  Social History Narrative   Lives with husband and grandson [name])   Occ: retired, Sales executive   Edu: [employer]          Allergies:  Allergies  Allergen Reactions  . Amoxicillin Hives    Sores in mouth  . Buprenorphine Hcl Itching    Can tolerate if necessary Can tolerate if necessary Can tolerate if necessary  . Morphine Itching    Can tolerate if necessary  . Morphine And Related Itching    Can tolerate if necessary  . Sulfa Antibiotics Hives    Sores in mouth Sores in mouth   . Sulfonamide Derivatives Hives    Sores in mouth  . Valium [Diazepam] Itching    Can tolerate if necessary    Metabolic Disorder Labs: No results found for: HGBA1C, MPG No results found for: PROLACTIN Lab Results  Component Value Date   CHOL 208 (H) 11/18/2016   TRIG 248.0 (H) 11/18/2016   HDL 43.10 11/18/2016   CHOLHDL 5 11/18/2016   VLDL 49.6 (H) 11/18/2016   Lab Results  Component Value Date   TSH 3.24 05/04/2018   TSH 0.45 01/29/2018    Therapeutic Level Labs: No results found for: LITHIUM No results found for: VALPROATE No components found for:  CBMZ  Current Medications: Current Outpatient Medications  Medication Sig Dispense Refill  . Calcium Carbonate-Vitamin D (CALCIUM-D PO) Take 1 tablet by mouth daily.    . celecoxib (CELEBREX) 200 MG capsule Take 1 capsule by mouth daily.    .  clonazePAM (KLONOPIN) 1 MG tablet Take one qam and 2 at bedtime 90 tablet 2  . dicyclomine (BENTYL) 10 MG capsule Take 1 capsule (10 mg total) by mouth daily as needed for spasms. 90 capsule 1  . HYDROcodone-acetaminophen (NORCO/VICODIN) 5-325 MG tablet Take 1 tablet by mouth every 6 (six) hours as needed for moderate pain.    Marland Kitchen lamoTRIgine (LAMICTAL) 25 MG tablet Take 2 tablets (50 mg total) by mouth 2 (two) times daily. 360 tablet 2  . levothyroxine (SYNTHROID) 75 MCG tablet 1 tablet before breakfast daily except half tablet on Sundays 90 tablet 1  . polyethylene glycol powder (GLYCOLAX/MIRALAX) powder polyethylene glycol 3350 17 gram oral powder packet  TK 17 GMS D    . PROAIR HFA 108 (90 Base) MCG/ACT inhaler USE 2 INHALATIONS EVERY 6 HOURS AS NEEDED FOR COUGH 25.5 g 1  . venlafaxine XR (EFFEXOR XR) 75 MG 24 hr capsule Take 1 capsule (75 mg total) by mouth daily. 90 capsule 2   No current facility-administered medications for this visit.      Musculoskeletal: Strength & Muscle Tone: within normal limits Gait & Station: normal Patient leans: N/A  Psychiatric Specialty Exam: Review of Systems  Constitutional: Positive for malaise/fatigue.  Musculoskeletal: Positive for back pain.  Psychiatric/Behavioral: Positive for hallucinations. The patient is nervous/anxious and has insomnia.   All other systems reviewed and are negative.   Last menstrual period 07/15/2012.There is no height or weight on file to calculate BMI.  General Appearance: NA  Eye Contact:  NA  Speech:  Clear and Coherent  Volume:  Normal  Mood:  Anxious, Dysphoric and Irritable  Affect:  NA  Thought Process:  Disorganized  Orientation:  Full (Time, Place, and Person)  Thought Content: Hallucinations: Auditory   Suicidal Thoughts:  No  Homicidal Thoughts:  No  Memory:  Immediate;   Good Recent;   Poor Remote;   Poor  Judgement:  Poor  Insight:  Shallow  Psychomotor Activity:  Decreased  Concentration:   Concentration: Poor and Attention Span: Poor  Recall:  Poor  Fund of Knowledge: Fair  Language: Good  Akathisia:  No  Handed:  Right  AIMS (if indicated): not done  Assets:  Communication Skills Desire for Improvement Resilience Social Support  ADL's:  Intact  Cognition: Impaired,  Mild  Sleep:  Poor   Screenings: GAD-7     Office Visit from 04/28/2018 in [place] at [place] from 11/18/2016 in [place] at [facility]  Total Score (max 30 points )  20    PHQ2-9     Office Visit from 04/28/2018 in [place] at [facility] from 11/18/2016 in [place] at [facility] Total Score  6  1  PHQ-9 Total Score  14  2       Assessment and Plan: This patient is an 85 year old female with a history of depression with psychosis.  I am guessing she is also undergoing early dementia.  She gets somewhat confused easily.  However she is very distraught about her brother's death and unable to sleep.  I have given her permission to take clonazepam 2 mg at bedtime and 1 mg during the day for anxiety.  She adamantly refuses to take any antipsychotic.  She will continue Lamictal 50 mg twice daily for mood stabilization and Effexor XR 75 mg daily for depression.  She will return to see me in 4 weeks or call sooner as needed   Levonne Spiller, MD 07/17/2018, 10:57 AM

## 2018-07-22 ENCOUNTER — Telehealth (HOSPITAL_COMMUNITY): Payer: Self-pay | Admitting: *Deleted

## 2018-07-22 NOTE — Telephone Encounter (Signed)
SCHEDULED APPT 6/4 @ 1:00 PM

## 2018-07-22 NOTE — Telephone Encounter (Signed)
thanks

## 2018-07-22 NOTE — Telephone Encounter (Signed)
This is too complicated to handle in a short phone call, please schedule her for 10:45 am tomorrow. If they think she is a danger to self, they should bring her to neared ED

## 2018-07-22 NOTE — Telephone Encounter (Signed)
Dr Harrington Challenger Mrs Khristen husband called his [name]) & son in law(TERRY) because he said Mrs Mikiala isn't doing well. Her husband is in fear " that she's going down a road she won't come back from". She's hearing voices & is always in pain just wants to take medication  & sleep. Family isn't sure what they should do? They think maybe she's become addict to some of her med's. They asked for a call back [phone]

## 2018-07-23 ENCOUNTER — Emergency Department
Admission: EM | Admit: 2018-07-23 | Discharge: 2018-07-24 | Disposition: A | Discharge status: Other Acute Inpatient Hospital | Payer: Medicare Other | Attending: Emergency Medicine | Admitting: Emergency Medicine

## 2018-07-23 ENCOUNTER — Ambulatory Visit (HOSPITAL_COMMUNITY): Payer: Medicare Other | Admitting: Psychiatry

## 2018-07-23 ENCOUNTER — Other Ambulatory Visit: Payer: Self-pay

## 2018-07-23 ENCOUNTER — Encounter: Payer: Self-pay | Admitting: Emergency Medicine

## 2018-07-23 ENCOUNTER — Telehealth (HOSPITAL_COMMUNITY): Payer: Self-pay | Admitting: *Deleted

## 2018-07-23 ENCOUNTER — Telehealth (HOSPITAL_COMMUNITY): Payer: Self-pay | Admitting: Psychiatry

## 2018-07-23 ENCOUNTER — Ambulatory Visit: Payer: Medicare Other

## 2018-07-23 DIAGNOSIS — E039 Hypothyroidism, unspecified: Secondary | ICD-10-CM | POA: Insufficient documentation

## 2018-07-23 DIAGNOSIS — R443 Hallucinations, unspecified: Secondary | ICD-10-CM | POA: Diagnosis present

## 2018-07-23 DIAGNOSIS — F329 Major depressive disorder, single episode, unspecified: Secondary | ICD-10-CM | POA: Diagnosis not present

## 2018-07-23 DIAGNOSIS — R52 Pain, unspecified: Secondary | ICD-10-CM | POA: Diagnosis not present

## 2018-07-23 DIAGNOSIS — F0391 Unspecified dementia with behavioral disturbance: Secondary | ICD-10-CM | POA: Diagnosis not present

## 2018-07-23 DIAGNOSIS — F333 Major depressive disorder, recurrent, severe with psychotic symptoms: Secondary | ICD-10-CM

## 2018-07-23 DIAGNOSIS — Z79899 Other long term (current) drug therapy: Secondary | ICD-10-CM | POA: Insufficient documentation

## 2018-07-23 DIAGNOSIS — F23 Brief psychotic disorder: Secondary | ICD-10-CM

## 2018-07-23 DIAGNOSIS — I10 Essential (primary) hypertension: Secondary | ICD-10-CM | POA: Diagnosis not present

## 2018-07-23 DIAGNOSIS — F29 Unspecified psychosis not due to a substance or known physiological condition: Secondary | ICD-10-CM | POA: Insufficient documentation

## 2018-07-23 DIAGNOSIS — R0902 Hypoxemia: Secondary | ICD-10-CM | POA: Diagnosis not present

## 2018-07-23 DIAGNOSIS — R5381 Other malaise: Secondary | ICD-10-CM | POA: Diagnosis not present

## 2018-07-23 DIAGNOSIS — F0390 Unspecified dementia without behavioral disturbance: Secondary | ICD-10-CM | POA: Diagnosis present

## 2018-07-23 LAB — CBC WITH DIFFERENTIAL/PLATELET
Abs Immature Granulocytes: 0.08 10*3/uL — ABNORMAL HIGH (ref 0.00–0.07)
Basophils Absolute: 0 10*3/uL (ref 0.0–0.1)
Basophils Relative: 0 %
Eosinophils Absolute: 0.1 10*3/uL (ref 0.0–0.5)
Eosinophils Relative: 1 %
HCT: 37.4 % (ref 36.0–46.0)
Hemoglobin: 12.2 g/dL (ref 12.0–15.0)
Immature Granulocytes: 1 %
Lymphocytes Relative: 11 %
Lymphs Abs: 1.4 10*3/uL (ref 0.7–4.0)
MCH: 29.1 pg (ref 26.0–34.0)
MCHC: 32.6 g/dL (ref 30.0–36.0)
MCV: 89.3 fL (ref 80.0–100.0)
Monocytes Absolute: 1 10*3/uL (ref 0.1–1.0)
Monocytes Relative: 7 %
Neutro Abs: 10.8 10*3/uL — ABNORMAL HIGH (ref 1.7–7.7)
Neutrophils Relative %: 80 %
Platelets: 303 10*3/uL (ref 150–400)
RBC: 4.19 MIL/uL (ref 3.87–5.11)
RDW: 13.3 % (ref 11.5–15.5)
WBC: 13.4 10*3/uL — ABNORMAL HIGH (ref 4.0–10.5)
nRBC: 0 % (ref 0.0–0.2)

## 2018-07-23 LAB — ETHANOL: Alcohol, Ethyl (B): <10 mg/dL (ref <10)

## 2018-07-23 LAB — COMPREHENSIVE METABOLIC PANEL
ALT: 13 U/L (ref 0–44)
AST: 18 U/L (ref 15–41)
Albumin: 3.7 g/dL (ref 3.5–5.0)
Alkaline Phosphatase: 69 U/L (ref 38–126)
Anion gap: 8 (ref 5–15)
BUN: 14 mg/dL (ref 8–23)
CO2: 23 mmol/L (ref 22–32)
Calcium: 9.1 mg/dL (ref 8.9–10.3)
Chloride: 97 mmol/L — ABNORMAL LOW (ref 98–111)
Creatinine, Ser: 0.97 mg/dL (ref 0.44–1.00)
GFR calc Af Amer: >60 mL/min (ref >60)
GFR calc non Af Amer: 54 mL/min — ABNORMAL LOW (ref >60)
Glucose, Bld: 102 mg/dL — ABNORMAL HIGH (ref 70–99)
Potassium: 4.3 mmol/L (ref 3.5–5.1)
Sodium: 128 mmol/L — ABNORMAL LOW (ref 135–145)
Total Bilirubin: 0.8 mg/dL (ref 0.3–1.2)
Total Protein: 7.1 g/dL (ref 6.5–8.1)

## 2018-07-23 LAB — SALICYLATE LEVEL: Salicylate Lvl: <7 mg/dL (ref 2.8–30.0)

## 2018-07-23 LAB — ACETAMINOPHEN LEVEL: Acetaminophen (Tylenol), Serum: <10 ug/mL — ABNORMAL LOW (ref 10–30)

## 2018-07-23 MED ORDER — HYDROCODONE-ACETAMINOPHEN 5-325 MG PO TABS
1.0000 | ORAL_TABLET | Freq: Once | ORAL | Status: AC
  Administered 2018-07-23: 1 via ORAL
  Filled 2018-07-23: qty 1

## 2018-07-23 MED ORDER — SODIUM CHLORIDE 0.9 % IV BOLUS
1000.0000 mL | Freq: Once | INTRAVENOUS | Status: AC
  Administered 2018-07-23: 1000 mL via INTRAVENOUS

## 2018-07-23 NOTE — Telephone Encounter (Signed)
She will be seen in ED and assessed by TTS to determine psychiatric admission

## 2018-07-23 NOTE — ED Notes (Signed)
Notify grandson Herbert Deaner with any update: [phone] or [name] [phone].

## 2018-07-23 NOTE — ED Provider Notes (Signed)
[facility] Emergency Department Provider Note   ____________________________________________   I have reviewed the triage vital signs and the nursing notes.   HISTORY  Chief Complaint Hallucinations  History limited by: Not Limited   HPI Jamie Pitts is a 85 y.o. female who presents to the emergency department today because of concern for psychiatric illness. The patient states that she has been hearing voices for a little while. They are making it hard for her to sleep. She has been taking hydrocodone for pain and clonazepam to try to sleep and that both make the voices work. She has talked to her psychiatrist on the phone but says she has not been taking any other medication. The patient is also complaining of decreased appetite. Denies any fevers.   Records reviewed. Per medical record review patient has been discussing with her psychiatrist and psychiatrist did recommend ED evaluation.   Past Medical History:  Diagnosis Date  . Adenomatous polyp    x1  . ALLERGIC RHINITIS 12/20/2006  . Chronic anxiety   . Chronic back pain 04/14/2007   02/2017 [facility] records reviewed - mild lumbar DDD and L SIJ pain s/p ESI latest 10/2016 with good effect  . Diverticulitis   . GERD (gastroesophageal reflux disease)    intermittent, treated with tums  . Hiatal hernia   . Hypothyroidism   . Insomnia   . Osteoporosis 2007   declined prolia  . Perforation of right tympanic membrane 09/03/2015   Chronic, seen by Dr Janace Hoard ENT   . Personal history of kidney stones    x1  . Postmenopausal atrophic vaginitis 2016   rec estrace cream - [place]  . Recurrent UTI   . Scoliosis   . Severe recurrent depression with psychosis ([place])   . Urinary retention   . Vitamin D deficiency     Patient Active Problem List   Diagnosis Date Noted  . Malnutrition of moderate degree 05/20/2017  . Fecal impaction ([place]) 05/18/2017  . Abdominal pain 05/18/2017  . Long-term current use of  opiate analgesic 03/06/2017  . Chronic pain syndrome 03/06/2017  . Nausea 03/04/2017  . Abdominal cramping 05/24/2016  . Perforation of right tympanic membrane 09/03/2015  . COPD (chronic obstructive pulmonary disease) ([place]) 12/07/2014  . Constipation 05/08/2014  . Urinary retention 05/08/2014  . Urinary urgency 04/05/2014  . Chronic cough 03/02/2013  . Dysphagia 11/05/2012  . Drug-induced Parkinsonism ([place]) 07/29/2012  . Insomnia secondary to depression with anxiety 05/22/2012  . Chronic low back pain 04/14/2007  . Hypothyroidism 11/04/2006  . GERD 11/04/2006  . OSTEOPOROSIS 11/04/2006  . Depression, major, recurrent, severe with psychosis ([place]) 08/29/2006    Past Surgical History:  Procedure Laterality Date  . ABDOMINAL HYSTERECTOMY  1979   ectopic pregnancy with one ovary removed  . APPENDECTOMY  1954  . CATARACT EXTRACTION, BILATERAL  11/2015  . CHOLECYSTECTOMY    . ESOPHAGOGASTRODUODENOSCOPY    . KNEE SURGERY Left 2012   L medial meniscal tear  . OOPHORECTOMY Right 1965   Ectopic pregnancy, removal of right ovary and tube- 1960  . ROTATOR CUFF REPAIR Right   . TONSILLECTOMY      Prior to Admission medications   Medication Sig Start Date End Date Taking? Authorizing Provider  Calcium Carbonate-Vitamin D (CALCIUM-D PO) Take 1 tablet by mouth daily.    [provider]  celecoxib (CELEBREX) 200 MG capsule Take 1 capsule by mouth daily.    [provider]  clonazePAM (KLONOPIN) 1 MG tablet  Take one qam and 2 at bedtime 07/17/18   Cloria Spring, MD  dicyclomine (BENTYL) 10 MG capsule Take 1 capsule (10 mg total) by mouth daily as needed for spasms. 05/24/16   Ria Bush, MD  HYDROcodone-acetaminophen (NORCO/VICODIN) 5-325 MG tablet Take 1 tablet by mouth every 6 (six) hours as needed for moderate pain. 05/24/16   Ria Bush, MD  lamoTRIgine (LAMICTAL) 25 MG tablet Take 2 tablets (50 mg total) by mouth 2 (two) times daily. 07/17/18   Cloria Spring, MD  levothyroxine (SYNTHROID) 75 MCG tablet 1 tablet before breakfast daily except half tablet on Sundays 06/18/18   Elayne Snare, MD  polyethylene glycol powder (GLYCOLAX/MIRALAX) powder polyethylene glycol 3350 17 gram oral powder packet  TK 17 GMS D    [provider]  PROAIR HFA 108 (90 Base) MCG/ACT inhaler USE 2 INHALATIONS EVERY 6 HOURS AS NEEDED FOR COUGH 12/19/15   Ria [facility], MD  venlafaxine XR (EFFEXOR XR) 75 MG 24 hr capsule Take 1 capsule (75 mg total) by mouth daily. 07/17/18 07/17/19  Cloria Spring, MD    Allergies Amoxicillin; Buprenorphine hcl; Morphine; Morphine and related; Sulfa antibiotics; Sulfonamide derivatives; and Valium [diazepam]  Family History  Problem Relation Age of Onset  . Anxiety disorder Grandchild   . Depression Grandchild   . Cancer Sister        lung  . Healthy Sister   . Diabetes Mother   . Diabetes Father   . Schizophrenia Sister   . Diabetes Sister   . Diabetes Brother   . Cancer Sister        kidney  . Alcohol abuse Neg Hx   . Bipolar disorder Neg Hx   . Dementia Neg Hx   . Drug abuse Neg Hx   . OCD Neg Hx   . Paranoid behavior Neg Hx   . Seizures Neg Hx   . Sexual abuse Neg Hx   . Physical abuse Neg Hx     Social History Social History   Tobacco Use  . Smoking status: Never Smoker  . Smokeless tobacco: Never Used  Substance Use Topics  . Alcohol use: No  . Drug use: No    Review of Systems Constitutional: No fever/chills Eyes: No visual changes. ENT: No sore throat. Cardiovascular: Denies chest pain. Respiratory: Denies shortness of breath. Gastrointestinal: Decreased appetite.  Genitourinary: Negative for dysuria. Musculoskeletal: Negative for back pain. Skin: Negative for rash. Neurological: Negative for headaches, focal weakness or numbness.  ____________________________________________   PHYSICAL EXAM:  VITAL SIGNS: ED Triage Vitals  Enc Vitals Group     BP 07/23/18 1558 133/67      Pulse Rate 07/23/18 1558 83     Resp 07/23/18 1558 18     Temp 07/23/18 1558 98.7 F (37.1 C)     Temp Source 07/23/18 1558 Oral     SpO2 07/23/18 1558 92 %     Weight 07/23/18 1559 170 lb (77.1 kg)     Height 07/23/18 1559 5\' 5"  (1.651 m)     Head Circumference --      Peak Flow --      Pain Score 07/23/18 1559 0   Constitutional: Alert and oriented.  Eyes: Conjunctivae are normal.  ENT      Head: Normocephalic and atraumatic.      Nose: No congestion/rhinnorhea.      Mouth/Throat: Mucous membranes are moist.      Neck: No stridor. Hematological/Lymphatic/Immunilogical: No cervical lymphadenopathy. Cardiovascular: Normal  rate, regular rhythm.  No murmurs, rubs, or gallops.  Respiratory: Normal respiratory effort without tachypnea nor retractions. Breath sounds are clear and equal bilaterally. No wheezes/rales/rhonchi. Gastrointestinal: Soft and non tender. No rebound. No guarding.  Genitourinary: Deferred Musculoskeletal: Normal range of motion in all extremities. No lower extremity edema. Neurologic:  Normal speech and language. No gross focal neurologic deficits are appreciated.  Skin:  Skin is warm, dry and intact. No rash noted. Psychiatric: Depressed ____________________________________________    LABS (pertinent positives/negatives)  CBC wbc 13.4, hgb 12.2, plt 303 CMP wnl except na 128, cl 97, glu 102 Acetaminophen <56 Salicylate <7 Ethanol <70  ____________________________________________   EKG  I, Nance Pear, attending physician, personally viewed and interpreted this EKG  EKG Time: 1556 Rate: 82 Rhythm: sinus rhythm Axis: normal Intervals: qtc 448 QRS: narrow ST changes: no st elevation Impression: normal ekg  ____________________________________________    RADIOLOGY  None  ____________________________________________   PROCEDURES  Procedures  ____________________________________________   INITIAL IMPRESSION / ASSESSMENT AND PLAN  / ED COURSE  Pertinent labs & imaging results that were available during my care of the patient were reviewed by me and considered in my medical decision making (see chart for details).   Patient presented to the emergency department today because of concerns for gastric illness.  It does sound per chart review that she is been trying to work with her psychiatrist.  Patient was found to have a mild leukocytosis here.  Awaiting urine at time of signout but do wonder if urinary tract infection could also be contributing.  Will have psychiatry evaluate.   ____________________________________________   FINAL CLINICAL IMPRESSION(S) / ED DIAGNOSES  Hallucinations  Note: This dictation was prepared with Dragon dictation. Any transcriptional errors that result from this process are unintentional     Nance Pear, MD 07/23/18 2238

## 2018-07-23 NOTE — Telephone Encounter (Signed)
Patient was not seen today for her visit because she was asleep.  The patient's husband daughter and grandson spoke to me via phone.  They all state that the patient is not doing well.  They report that she will get out of bed is sometimes hallucinating not caring for self.  She seems very confused at times and delusional.  I have tried to get her to take antipsychotic medication recently and she refuses.  Her grandson states that she claims she is not taking any of her medications and he thinks she may be abusing pain medicines.  This needs urgent-emergent assessment and I have recommended the family take the patient to the nearest ED-[facility].  The husband states that he will try and he will call back and let us know what has transpired.

## 2018-07-23 NOTE — ED Triage Notes (Signed)
Pt here via EMS from home with husband with c/o not eating or drinking, depressed since her brother died recently, has been hearing voices in her head for a few years now. NAD.

## 2018-07-23 NOTE — Telephone Encounter (Signed)
Dr Harrington Challenger [facility] has transferred patient to [place] they need a order sent for admission

## 2018-07-24 DIAGNOSIS — F29 Unspecified psychosis not due to a substance or known physiological condition: Secondary | ICD-10-CM | POA: Diagnosis not present

## 2018-07-24 DIAGNOSIS — G8929 Other chronic pain: Secondary | ICD-10-CM | POA: Diagnosis present

## 2018-07-24 DIAGNOSIS — R031 Nonspecific low blood-pressure reading: Secondary | ICD-10-CM | POA: Diagnosis not present

## 2018-07-24 DIAGNOSIS — F333 Major depressive disorder, recurrent, severe with psychotic symptoms: Secondary | ICD-10-CM | POA: Diagnosis present

## 2018-07-24 DIAGNOSIS — G2119 Other drug induced secondary parkinsonism: Secondary | ICD-10-CM | POA: Diagnosis present

## 2018-07-24 DIAGNOSIS — N390 Urinary tract infection, site not specified: Secondary | ICD-10-CM | POA: Diagnosis not present

## 2018-07-24 DIAGNOSIS — E039 Hypothyroidism, unspecified: Secondary | ICD-10-CM | POA: Diagnosis present

## 2018-07-24 DIAGNOSIS — R001 Bradycardia, unspecified: Secondary | ICD-10-CM | POA: Diagnosis not present

## 2018-07-24 DIAGNOSIS — K5909 Other constipation: Secondary | ICD-10-CM | POA: Diagnosis not present

## 2018-07-24 DIAGNOSIS — M810 Age-related osteoporosis without current pathological fracture: Secondary | ICD-10-CM | POA: Diagnosis present

## 2018-07-24 DIAGNOSIS — F0390 Unspecified dementia without behavioral disturbance: Secondary | ICD-10-CM | POA: Diagnosis present

## 2018-07-24 DIAGNOSIS — R609 Edema, unspecified: Secondary | ICD-10-CM | POA: Diagnosis not present

## 2018-07-24 DIAGNOSIS — R030 Elevated blood-pressure reading, without diagnosis of hypertension: Secondary | ICD-10-CM | POA: Diagnosis not present

## 2018-07-24 DIAGNOSIS — K219 Gastro-esophageal reflux disease without esophagitis: Secondary | ICD-10-CM | POA: Diagnosis present

## 2018-07-24 DIAGNOSIS — J449 Chronic obstructive pulmonary disease, unspecified: Secondary | ICD-10-CM | POA: Diagnosis present

## 2018-07-24 DIAGNOSIS — R42 Dizziness and giddiness: Secondary | ICD-10-CM | POA: Diagnosis not present

## 2018-07-24 DIAGNOSIS — F0391 Unspecified dementia with behavioral disturbance: Secondary | ICD-10-CM | POA: Diagnosis not present

## 2018-07-24 DIAGNOSIS — J45991 Cough variant asthma: Secondary | ICD-10-CM | POA: Diagnosis present

## 2018-07-24 DIAGNOSIS — Z79891 Long term (current) use of opiate analgesic: Secondary | ICD-10-CM | POA: Diagnosis not present

## 2018-07-24 DIAGNOSIS — K5900 Constipation, unspecified: Secondary | ICD-10-CM | POA: Diagnosis not present

## 2018-07-24 DIAGNOSIS — M419 Scoliosis, unspecified: Secondary | ICD-10-CM | POA: Diagnosis not present

## 2018-07-24 DIAGNOSIS — R339 Retention of urine, unspecified: Secondary | ICD-10-CM | POA: Diagnosis present

## 2018-07-24 DIAGNOSIS — F411 Generalized anxiety disorder: Secondary | ICD-10-CM | POA: Diagnosis present

## 2018-07-24 DIAGNOSIS — G4700 Insomnia, unspecified: Secondary | ICD-10-CM | POA: Diagnosis present

## 2018-07-24 DIAGNOSIS — Z119 Encounter for screening for infectious and parasitic diseases, unspecified: Secondary | ICD-10-CM | POA: Diagnosis not present

## 2018-07-24 DIAGNOSIS — G894 Chronic pain syndrome: Secondary | ICD-10-CM | POA: Diagnosis not present

## 2018-07-24 DIAGNOSIS — R6889 Other general symptoms and signs: Secondary | ICD-10-CM | POA: Diagnosis not present

## 2018-07-24 DIAGNOSIS — Z79899 Other long term (current) drug therapy: Secondary | ICD-10-CM | POA: Diagnosis not present

## 2018-07-24 DIAGNOSIS — H7291 Unspecified perforation of tympanic membrane, right ear: Secondary | ICD-10-CM | POA: Diagnosis not present

## 2018-07-24 DIAGNOSIS — M545 Low back pain: Secondary | ICD-10-CM | POA: Diagnosis not present

## 2018-07-24 LAB — URINALYSIS, COMPLETE (UACMP) WITH MICROSCOPIC
Bacteria, UA: NONE SEEN
Bilirubin Urine: NEGATIVE
Glucose, UA: NEGATIVE mg/dL
Hgb urine dipstick: NEGATIVE
Ketones, ur: NEGATIVE mg/dL
Leukocytes,Ua: NEGATIVE
Nitrite: NEGATIVE
Protein, ur: NEGATIVE mg/dL
Specific Gravity, Urine: 1.01 (ref 1.005–1.030)
pH: 7 (ref 5.0–8.0)

## 2018-07-24 LAB — URINE DRUG SCREEN, QUALITATIVE (ARMC ONLY)
Amphetamines, Ur Screen: NOT DETECTED
Barbiturates, Ur Screen: NOT DETECTED
Benzodiazepine, Ur Scrn: POSITIVE — AB
Cannabinoid 50 Ng, Ur ~~LOC~~: NOT DETECTED
Cocaine Metabolite,Ur ~~LOC~~: NOT DETECTED
MDMA (Ecstasy)Ur Screen: NOT DETECTED
Methadone Scn, Ur: NOT DETECTED
Opiate, Ur Screen: POSITIVE — AB
Phencyclidine (PCP) Ur S: NOT DETECTED
Tricyclic, Ur Screen: NOT DETECTED

## 2018-07-24 MED ORDER — CLONAZEPAM 0.5 MG PO TABS: 0.5000 mg | ORAL_TABLET | Freq: Two times a day (BID) | ORAL | Status: DC | PRN

## 2018-07-24 MED ORDER — HYDROCODONE-ACETAMINOPHEN 5-325 MG PO TABS
1.0000 | ORAL_TABLET | Freq: Once | ORAL | Status: AC
  Administered 2018-07-24: 1 via ORAL
  Filled 2018-07-24: qty 1

## 2018-07-24 MED ORDER — LAMOTRIGINE 25 MG PO TABS
50.0000 mg | ORAL_TABLET | Freq: Two times a day (BID) | ORAL | Status: DC
  Administered 2018-07-24: 50 mg via ORAL
  Filled 2018-07-24: qty 2

## 2018-07-24 MED ORDER — VENLAFAXINE HCL ER 75 MG PO CP24
75.0000 mg | ORAL_CAPSULE | Freq: Every day | ORAL | Status: DC
  Administered 2018-07-24: 75 mg via ORAL
  Filled 2018-07-24: qty 1

## 2018-07-24 MED ORDER — LEVOTHYROXINE SODIUM 50 MCG PO TABS
75.0000 ug | ORAL_TABLET | Freq: Every day | ORAL | Status: DC
  Administered 2018-07-24: 75 ug via ORAL
  Filled 2018-07-24: qty 2

## 2018-07-24 NOTE — ED Notes (Signed)
Pt incontinent of BM; peri care provided; linens changed; pt repositioned.

## 2018-07-24 NOTE — ED Provider Notes (Signed)
-----------------------------------------   2:08 PM on 07/24/2018 -----------------------------------------   Psychiatry consult note reviewed.  Findings of acute psychosis.  Patient mains medically stable.  She is excepted for psychiatric hospitalization at [facility].  I will initiate IVC due to her decompensated psychiatric illness with hallucinations and delusions until she can be further psychiatrically stabilized.   Carrie Mew, MD 07/24/18 1409

## 2018-07-24 NOTE — ED Notes (Signed)
Spoke with PAtients granddaughter Almyra Free on phone

## 2018-07-24 NOTE — BH Assessment (Signed)
Writer called and left a HIPPA Compliant message with husband Juanda Crumble [phone]), requesting a return phone call.

## 2018-07-24 NOTE — ED Notes (Signed)
Pt placed back on 2L [employer] d/t desat to 90% on RA.

## 2018-07-24 NOTE — ED Notes (Signed)
Patients cleaned and brief changed

## 2018-07-24 NOTE — ED Notes (Signed)
Pt repositioned in bed.

## 2018-07-24 NOTE — BH Assessment (Signed)
Writer called and left a HIPPA Compliant message with the daughter ([phone]) and grand daughter ([phone]), requesting a return phone call.

## 2018-07-24 NOTE — ED Notes (Signed)
Pt dressed out in wine colored scrubs; personal belongings bagged and given to sheriff.

## 2018-07-24 NOTE — ED Notes (Signed)
Pt accepted to facility but does not yet have assigned bed.

## 2018-07-24 NOTE — ED Notes (Signed)
Pt walked to bedside toilet. Steady. Urinated.

## 2018-07-24 NOTE — ED Notes (Signed)
emtala reviewed by this RN 

## 2018-07-24 NOTE — ED Notes (Signed)
Pt had another BM. Peri care provided. Linens changed.

## 2018-07-24 NOTE — BH Assessment (Signed)
Referral information for Psychiatric Hospitalization faxed to;   [facility] [phone]),   . [facility] ([phone]--[phone])  . [facility] ([phone]) [phone]  . [facility] [phone] -or- [phone]) [phone]  . [facility] ([phone]),  . [facility] [phone], [phone], [phone] or [phone]),   . [place] [phone]),   . [address]Lurena Joiner [phone]),   . [facility] [phone] or [phone])  . [place] [phone] or [phone]),   . [facility] [phone]).

## 2018-07-24 NOTE — ED Notes (Signed)
Accepting physician is Dr. Gerrit Halls.

## 2018-07-24 NOTE — BH Assessment (Signed)
Patient has been accepted to [facility].  Patient assigned to room Adult  Accepting physician is Dr. Gerrit Halls.  Call report to [phone].  Representative was [employer].   ER Staff is aware of it:  Glenda, ER Secretary  Dr. Joni Fears, ER MD  Cleda Mccreedy, Patient's Nurse     Patient's daughter ([phone]) have been updated as well.   Address:  [address] [place],  [place], [place] [address]

## 2018-07-24 NOTE — ED Notes (Signed)
Pt's family dropped off some belongings for pt. Given to pt by [facility] RN.

## 2018-07-24 NOTE — ED Notes (Signed)
Pt explaining to psych staff about the voices she has been hearing. Denies any visuals. Denies any thoughts/ideas/intentions to harm self.

## 2018-07-24 NOTE — Consult Note (Signed)
[facility] Face-to-Face Psychiatry Consult   Reason for Consult: Hallucinations Referring Physician: Dr. Archie Balboa Patient Identification: Jamie Pitts MRN:  [id number] Principal Diagnosis: Psychosis in elderly [facility]) Diagnosis:  Principal Problem:   Psychosis in elderly [facility])   Total Time spent with patient: 1 hour  Subjective: "Honey, is just these voices in my head telling me all kinds of things." Jamie Pitts is a 85 y.o. female patient presented to [facility] ED via POV voluntarily.  "The voices has gotten real bad.  I am not able to sleep.  And all I wanted to do is sleep."  During the patient assessment he became extremely upset at times very tearful wanting the voices to go away.  The patient stated she lost her fever brother about 3 weeks ago and she has been extremely sad and does not want to do anything.  The patient was very detailed with her hearing voices stating "sometimes they say we get things to me.  They tell me, that I am you like Jesus but I do not love God."  The patient admits to not taking her medication stating "if I do not take my medicine I think the voices will go away."   The patient was seen face-to-face by this provider; chart reviewed and consulted with Dr. ED providers and Dr. Owens Shark on 07/23/2018 due to the care of the patient. It was discussed with the provider that the patient does meet criteria to be admitted to a geriatric psychiatric inpatient facility for stabilization and treatment. On evaluation the patient  is alert and oriented x3, anxious but cooperative her mood is mood-congruent with affect.  The patient does appear to be responding to internal stimuli.  She is not presenting with any delusional thinking. The patient admits to auditory hallucinations but denies visual hallucinations. The patient denies any suicidal, homicidal, or self-harm ideations. The patient is not presenting with any psychotic or paranoid behaviors. During an encounter with the patient, she  was able to answer most questions appropriately, but very emotional due to her psychiatric set back. Plan: The patient is not a safety risk to self,  but due to her auditory hallucinations she is placed in danger of possibly self-harm.  She discussed the voices are telling her "bad things" and she does not want to do those things.  The patient is currently requiring geriatrics psychiatric inpatient admission for stabilization and treatment. Patient has a lot of high-risk factors. Therefore; this provider is recommending inpatient psychiatric care.  HPI: Per Dr. Coralee North is a 85 y.o. female who presents to the emergency department today because of concern for psychiatric illness. The patient states that she has been hearing voices for a little while. They are making it hard for her to sleep. She has been taking hydrocodone for pain and clonazepam to try to sleep and that both make the voices work. She has talked to her psychiatrist on the phone but says she has not been taking any other medication. The patient is also complaining of decreased appetite. Denies any fevers.   Records reviewed. Per medical record review patient has been discussing with her psychiatrist and psychiatrist did recommend ED evaluation.    Past Psychiatric History:  Chronic anxiety Depression, major, recurrent, severe with psychosis ([place] [address]) Schizophrenia  Risk to Self:  No Risk to Others:  No Prior Inpatient Therapy:  Yes Prior Outpatient Therapy:  Yes  Past Medical History:  Past Medical History:  Diagnosis Date  . Adenomatous polyp    x1  .  ALLERGIC RHINITIS 12/20/2006  . Chronic anxiety   . Chronic back pain 04/14/2007   02/2017 [facility] records reviewed - mild lumbar DDD and L SIJ pain s/p ESI latest 10/2016 with good effect  . Diverticulitis   . GERD (gastroesophageal reflux disease)    intermittent, treated with tums  . Hiatal hernia   . Hypothyroidism   . Insomnia   . Osteoporosis 2007    declined prolia  . Perforation of right tympanic membrane 09/03/2015   Chronic, seen by Dr Janace Hoard ENT   . Personal history of kidney stones    x1  . Postmenopausal atrophic vaginitis 2016   rec estrace cream - [place]  . Recurrent UTI   . Scoliosis   . Severe recurrent depression with psychosis ([place])   . Urinary retention   . Vitamin D deficiency     Past Surgical History:  Procedure Laterality Date  . ABDOMINAL HYSTERECTOMY  1979   ectopic pregnancy with one ovary removed  . APPENDECTOMY  1954  . CATARACT EXTRACTION, BILATERAL  11/2015  . CHOLECYSTECTOMY    . ESOPHAGOGASTRODUODENOSCOPY    . KNEE SURGERY Left 2012   L medial meniscal tear  . OOPHORECTOMY Right 1965   Ectopic pregnancy, removal of right ovary and tube- 1960  . ROTATOR CUFF REPAIR Right   . TONSILLECTOMY     Family History:  Family History  Problem Relation Age of Onset  . Anxiety disorder Grandchild   . Depression Grandchild   . Cancer Sister        lung  . Healthy Sister   . Diabetes Mother   . Diabetes Father   . Schizophrenia Sister   . Diabetes Sister   . Diabetes Brother   . Cancer Sister        kidney  . Alcohol abuse Neg Hx   . Bipolar disorder Neg Hx   . Dementia Neg Hx   . Drug abuse Neg Hx   . OCD Neg Hx   . Paranoid behavior Neg Hx   . Seizures Neg Hx   . Sexual abuse Neg Hx   . Physical abuse Neg Hx    Family Psychiatric  History:  Social History:  Social History   Substance and Sexual Activity  Alcohol Use No     Social History   Substance and Sexual Activity  Drug Use No    Social History   Socioeconomic History  . Marital status: Married    Spouse name: Not on file  . Number of children: 1  . Years of education: Not on file  . Highest education level: Not on file  Occupational History  . Occupation: retired Market researcher  Social Needs  . Financial resource strain: Patient refused  . Food insecurity:    Worry: Patient refused    Inability: Patient refused  .  Transportation needs:    Medical: Patient refused    Non-medical: Patient refused  Tobacco Use  . Smoking status: Never Smoker  . Smokeless tobacco: Never Used  Substance and Sexual Activity  . Alcohol use: No  . Drug use: No  . Sexual activity: Not Currently    Birth control/protection: Diaphragm  Lifestyle  . Physical activity:    Days per week: Patient refused    Minutes per session: Patient refused  . Stress: Patient refused  Relationships  . Social connections:    Talks on phone: Patient refused    Gets together: Patient refused    Attends religious service: Patient  refused    Active member of club or organization: Patient refused    Attends meetings of clubs or organizations: Patient refused    Relationship status: Patient refused  Other Topics Concern  . Not on file  Social History Narrative   Lives with husband and grandson [name])   Occ: retired, Sales executive   Edu: Architect Social History:    Allergies:   Allergies  Allergen Reactions  . Amoxicillin Hives    Sores in mouth  . Buprenorphine Hcl Itching    Can tolerate if necessary Can tolerate if necessary Can tolerate if necessary  . Morphine Itching    Can tolerate if necessary  . Morphine And Related Itching    Can tolerate if necessary  . Sulfa Antibiotics Hives    Sores in mouth Sores in mouth   . Sulfonamide Derivatives Hives    Sores in mouth  . Valium [Diazepam] Itching    Can tolerate if necessary    Labs:  Results for orders placed or performed during the hospital encounter of 07/23/18 (from the past 48 hour(s))  Comprehensive metabolic panel     Status: Abnormal   Collection Time: 07/23/18  4:01 PM  Result Value Ref Range   Sodium 128 (L) 135 - 145 mmol/L   Potassium 4.3 3.5 - 5.1 mmol/L   Chloride 97 (L) 98 - 111 mmol/L   CO2 23 22 - 32 mmol/L   Glucose, Bld 102 (H) 70 - 99 mg/dL   BUN 14 8 - 23 mg/dL   Creatinine, Ser 0.97 0.44 - 1.00 mg/dL   Calcium 9.1 8.9  - 10.3 mg/dL   Total Protein 7.1 6.5 - 8.1 g/dL   Albumin 3.7 3.5 - 5.0 g/dL   AST 18 15 - 41 U/L   ALT 13 0 - 44 U/L   Alkaline Phosphatase 69 38 - 126 U/L   Total Bilirubin 0.8 0.3 - 1.2 mg/dL   GFR calc non Af Amer 54 (L) >60 mL/min   GFR calc Af Amer >60 >60 mL/min   Anion gap 8 5 - 15    Comment: Performed at [facility], [address], [place], [place] [address]  Ethanol     Status: None   Collection Time: 07/23/18  4:01 PM  Result Value Ref Range   Alcohol, Ethyl (B) <10 <10 mg/dL    Comment: (NOTE) Lowest detectable limit for serum alcohol is 10 mg/dL. For medical purposes only. Performed at [facility], [address] [place], [place] [address]   Salicylate level     Status: None   Collection Time: 07/23/18  4:01 PM  Result Value Ref Range   Salicylate Lvl <6.2 2.8 - 30.0 mg/dL    Comment: Performed at [facility], [address] [place], [place] [address]  Acetaminophen level     Status: Abnormal   Collection Time: 07/23/18  4:01 PM  Result Value Ref Range   Acetaminophen (Tylenol), Serum <10 (L) 10 - 30 ug/mL    Comment: (NOTE) Therapeutic concentrations vary significantly. A range of 10-30 ug/mL  may be an effective concentration for many patients. However, some  are best treated at concentrations outside of this range. Acetaminophen concentrations >150 ug/mL at 4 hours after ingestion  and >50 ug/mL at 12 hours after ingestion are often associated with  toxic reactions. Performed at [facility], [address] [place], [place] [address]   CBC with Differential  Status: Abnormal   Collection Time: 07/23/18  4:02 PM  Result Value Ref Range   WBC 13.4 (H) 4.0 - 10.5 K/uL   RBC 4.19 3.87 - 5.11 MIL/uL   Hemoglobin 12.2 12.0 - 15.0 g/dL   HCT 37.4 36.0 - 46.0 %   MCV 89.3 80.0 - 100.0 fL   MCH 29.1 26.0 - 34.0 pg   MCHC 32.6 30.0 - 36.0 g/dL   RDW 13.3 11.5 - 15.5 %   Platelets 303 150 - 400 K/uL   nRBC 0.0  0.0 - 0.2 %   Neutrophils Relative % 80 %   Neutro Abs 10.8 (H) 1.7 - 7.7 K/uL   Lymphocytes Relative 11 %   Lymphs Abs 1.4 0.7 - 4.0 K/uL   Monocytes Relative 7 %   Monocytes Absolute 1.0 0.1 - 1.0 K/uL   Eosinophils Relative 1 %   Eosinophils Absolute 0.1 0.0 - 0.5 K/uL   Basophils Relative 0 %   Basophils Absolute 0.0 0.0 - 0.1 K/uL   Immature Granulocytes 1 %   Abs Immature Granulocytes 0.08 (H) 0.00 - 0.07 K/uL    Comment: Performed at [facility], [address] [place], [place] [address]  Urinalysis, Complete w Microscopic     Status: Abnormal   Collection Time: 07/24/18  3:35 AM  Result Value Ref Range   Color, Urine YELLOW (A) YELLOW   APPearance CLEAR (A) CLEAR   Specific Gravity, Urine 1.010 1.005 - 1.030   pH 7.0 5.0 - 8.0   Glucose, UA NEGATIVE NEGATIVE mg/dL   Hgb urine dipstick NEGATIVE NEGATIVE   Bilirubin Urine NEGATIVE NEGATIVE   Ketones, ur NEGATIVE NEGATIVE mg/dL   Protein, ur NEGATIVE NEGATIVE mg/dL   Nitrite NEGATIVE NEGATIVE   Leukocytes,Ua NEGATIVE NEGATIVE   RBC / HPF 6-10 0 - 5 RBC/hpf   WBC, UA 0-5 0 - 5 WBC/hpf   Bacteria, UA NONE SEEN NONE SEEN   Squamous Epithelial / LPF 0-5 0 - 5    Comment: Performed at [facility], [address], [place], [place] [address]  Urine Drug Screen, Qualitative     Status: Abnormal   Collection Time: 07/24/18  3:35 AM  Result Value Ref Range   Tricyclic, Ur Screen NONE DETECTED NONE DETECTED   Amphetamines, Ur Screen NONE DETECTED NONE DETECTED   MDMA (Ecstasy)Ur Screen NONE DETECTED NONE DETECTED   Cocaine Metabolite,Ur [place] NONE DETECTED NONE DETECTED   Opiate, Ur Screen POSITIVE (A) NONE DETECTED   Phencyclidine (PCP) Ur S NONE DETECTED NONE DETECTED   Cannabinoid 50 Ng, Ur [place] NONE DETECTED NONE DETECTED   Barbiturates, Ur Screen NONE DETECTED NONE DETECTED   Benzodiazepine, Ur Scrn POSITIVE (A) NONE DETECTED   Methadone Scn, Ur NONE DETECTED NONE DETECTED    Comment: (NOTE) Tricyclics  + metabolites, urine    Cutoff 1000 ng/mL Amphetamines + metabolites, urine  Cutoff 1000 ng/mL MDMA (Ecstasy), urine              Cutoff 500 ng/mL Cocaine Metabolite, urine          Cutoff 300 ng/mL Opiate + metabolites, urine        Cutoff 300 ng/mL Phencyclidine (PCP), urine         Cutoff 25 ng/mL Cannabinoid, urine                 Cutoff 50 ng/mL Barbiturates + metabolites, urine  Cutoff 200 ng/mL Benzodiazepine, urine  Cutoff 200 ng/mL Methadone, urine                   Cutoff 300 ng/mL The urine drug screen provides only a preliminary, unconfirmed analytical test result and should not be used for non-medical purposes. Clinical consideration and professional judgment should be applied to any positive drug screen result due to possible interfering substances. A more specific alternate chemical method must be used in order to obtain a confirmed analytical result. Gas chromatography / mass spectrometry (GC/MS) is the preferred confirmat ory method. Performed at [facility], [address] [place], [place] [address]     No current facility-administered medications for this encounter.    Current Outpatient Medications  Medication Sig Dispense Refill  . clonazePAM (KLONOPIN) 1 MG tablet Take one qam and 2 at bedtime (Patient taking differently: Take 0.5-1 mg by mouth 2 (two) times daily. Take 0.5 mg bid and 1 mg at bedtime) 90 tablet 2  . HYDROcodone-acetaminophen (NORCO) 7.5-325 MG tablet Take 1 tablet by mouth 4 (four) times daily as needed.    . lamoTRIgine (LAMICTAL) 25 MG tablet Take 2 tablets (50 mg total) by mouth 2 (two) times daily. 360 tablet 2  . levothyroxine (SYNTHROID) 75 MCG tablet 1 tablet before breakfast daily except half tablet on Sundays 90 tablet 1  . meloxicam (MOBIC) 7.5 MG tablet Take 7.5 mg by mouth 2 (two) times daily as needed.    . naloxone (NARCAN) nasal spray 4 mg/0.1 mL Place 1 spray into the nose once.    . venlafaxine XR  (EFFEXOR XR) 75 MG 24 hr capsule Take 1 capsule (75 mg total) by mouth daily. 90 capsule 2    Musculoskeletal: Strength & Muscle Tone: decreased Gait & Station: unsteady Patient leans: Right  Psychiatric Specialty Exam: Physical Exam  Nursing note and vitals reviewed. Constitutional: She is oriented to person, place, and time. She appears well-developed and well-nourished.  HENT:  Head: Normocephalic and atraumatic.  Eyes: Pupils are equal, round, and reactive to light. Conjunctivae are normal.  Neck: Normal range of motion. Neck supple.  Cardiovascular: Normal rate and regular rhythm.  Respiratory: Effort normal.  Musculoskeletal: Normal range of motion.  Neurological: She is alert and oriented to person, place, and time.  Skin: Skin is warm and dry.    Review of Systems  Psychiatric/Behavioral: Positive for depression. The patient is nervous/anxious.   All other systems reviewed and are negative.   Blood pressure 133/72, pulse 75, temperature 98.7 F (37.1 C), temperature source Oral, resp. rate (!) 23, height 5\' 5"  (1.651 m), weight 77.1 kg, last menstrual period 07/15/2012, SpO2 93 %.Body mass index is 28.29 kg/m.  General Appearance: Fairly Groomed  Eye Contact:  Fair  Speech:  Slow  Volume:  Decreased  Mood:  Depressed and Hopeless  Affect:  Depressed, Flat and Tearful  Thought Process:  Goal Directed  Orientation:  Other:  A&O x2-3  Thought Content:  Logical and Rumination  Suicidal Thoughts:  No  Homicidal Thoughts:  No  Memory:  Recent;   Fair Remote;   Fair  Judgement:  Poor  Insight:  Fair  Psychomotor Activity:  Decreased  Concentration:  Concentration: Poor and Attention Span: Poor  Recall:  [employer] of Knowledge:  Fair  Language:  Fair  Akathisia:  NA  Handed:  Right  AIMS (if indicated):     Assets:  Desire for Improvement Social Support  ADL's:  Impaired  Cognition:  Impaired,  Mild  Sleep:        Treatment Plan Summary: Daily contact  with patient to assess and evaluate symptoms and progress in treatment, Medication management and Plan Patient does meet requirements for geriatrics psychiatric inpatient admission for stabilization and treatment  Disposition: Supportive therapy provided about ongoing stressors. Patient does require geriatrics psychiatric inpatient admission for stabilization and treatment.  Lamont Dowdy, NP 07/24/2018 7:12 AM

## 2018-07-24 NOTE — ED Notes (Signed)
Patient cleaned up and brief changed.

## 2018-07-29 ENCOUNTER — Encounter: Payer: Medicare Other | Admitting: Family Medicine

## 2018-08-12 ENCOUNTER — Ambulatory Visit (INDEPENDENT_AMBULATORY_CARE_PROVIDER_SITE_OTHER): Payer: Medicare Other | Admitting: Family Medicine

## 2018-08-12 ENCOUNTER — Encounter: Payer: Self-pay | Admitting: Family Medicine

## 2018-08-12 DIAGNOSIS — F03918 Unspecified dementia, unspecified severity, with other behavioral disturbance: Secondary | ICD-10-CM

## 2018-08-12 DIAGNOSIS — F0391 Unspecified dementia with behavioral disturbance: Secondary | ICD-10-CM

## 2018-08-12 DIAGNOSIS — M79604 Pain in right leg: Secondary | ICD-10-CM

## 2018-08-12 DIAGNOSIS — M79605 Pain in left leg: Secondary | ICD-10-CM | POA: Diagnosis not present

## 2018-08-12 NOTE — Progress Notes (Signed)
   Jamie Pitts - 85 y.o. female  MRN [id number]  Date of Birth: 07-23-1933  PCP: Ria Bush, MD  This service was provided via telemedicine. Phone Visit performed on 08/12/2018    Rationale for phone visit along with limitations reviewed. Patient consented to telephone encounter.    Location of patient: at home Location of provider: in office, [place] @ [facility] Name of referring provider: N/A   Names of persons and role in encounter: Provider: Ria Bush, MD  Patient: Jamie Pitts  Other: N/A   Time on call: 12:31pm - 12:42pm   Subjective: Chief Complaint  Patient presents with  . Hospitalization Follow-up    Seen at [facility] ED on 07/23/18.     HPI:  Recent ER visit, records reviewed. Found to be in acute psychosis (hearing voices affecting ability to sleep) - transferred to [facility] behavioral hospital under involuntary commitment - she states she was sent to a [facility] facility for 17 days. She states she was treated for deep depression and psychosis. She cannot find discharge summary but states she was placed on risperdal 1.5mg  twice daily.  She does not know what meds were changed.  She has upcoming appt with psych tomorrow.  She had a fall while at the rehab center, legs are hurting since then.  Continues pain regimen of hydrocodone/clonazepam.   I asked her to bring me a copy of her discharge summary.    Objective/Observations:  No physical exam or vital signs collected unless specifically identified below.   Temp (!) 97 F (36.1 C)   Ht 5\' 5"  (1.651 m)   Wt 171 lb (77.6 kg)   LMP 07/15/2012   BMI 28.46 kg/m    Respiratory status: speaks in complete sentences without evident shortness of breath.   Assessment/Plan:  Psychosis in elderly ([place]) Overall feeling well. Pt unsure what she is taking other than recently starting risperdal by behavioral health. She does have psych f/u tomorrow. I have asked her to drop off discharge  summary copy to update discharge medication list.   Bilateral leg pain Supportive care reviewed, encouraged in-office eval if not improving with time.    I discussed the assessment and treatment plan with the patient. The patient was provided an opportunity to ask questions and all were answered. The patient agreed with the plan and demonstrated an understanding of the instructions.  Lab Orders  No laboratory test(s) ordered today    No orders of the defined types were placed in this encounter.   The patient was advised to call back or seek an in-person evaluation if the symptoms worsen or if the condition fails to improve as anticipated.  Ria Bush, MD

## 2018-08-13 ENCOUNTER — Ambulatory Visit (INDEPENDENT_AMBULATORY_CARE_PROVIDER_SITE_OTHER): Payer: Medicare Other | Admitting: Psychiatry

## 2018-08-13 ENCOUNTER — Other Ambulatory Visit: Payer: Self-pay

## 2018-08-13 ENCOUNTER — Encounter (HOSPITAL_COMMUNITY): Payer: Self-pay | Admitting: Psychiatry

## 2018-08-13 DIAGNOSIS — M79605 Pain in left leg: Secondary | ICD-10-CM | POA: Insufficient documentation

## 2018-08-13 DIAGNOSIS — F341 Dysthymic disorder: Secondary | ICD-10-CM | POA: Diagnosis not present

## 2018-08-13 DIAGNOSIS — F29 Unspecified psychosis not due to a substance or known physiological condition: Secondary | ICD-10-CM

## 2018-08-13 DIAGNOSIS — M79604 Pain in right leg: Secondary | ICD-10-CM | POA: Insufficient documentation

## 2018-08-13 MED ORDER — TRAZODONE HCL 150 MG PO TABS: 150.0000 mg | ORAL_TABLET | Freq: Every day | ORAL | 2 refills | Status: AC

## 2018-08-13 MED ORDER — RISPERIDONE 3 MG PO TABS: 1.5000 mg | ORAL_TABLET | Freq: Two times a day (BID) | ORAL | 2 refills | Status: AC

## 2018-08-13 MED ORDER — VENLAFAXINE HCL ER 75 MG PO CP24: 75.0000 mg | ORAL_CAPSULE | Freq: Every day | ORAL | 2 refills | Status: AC

## 2018-08-13 MED ORDER — LAMOTRIGINE 100 MG PO TABS: 100.0000 mg | ORAL_TABLET | Freq: Two times a day (BID) | ORAL | 2 refills | Status: DC

## 2018-08-13 NOTE — Progress Notes (Signed)
Virtual Visit via Telephone Note  I connected with Jamie Pitts on 08/13/18 at  1:00 PM EDT by telephone and verified that I am speaking with the correct person using two identifiers.   I discussed the limitations, risks, security and privacy concerns of performing an evaluation and management service by telephone and the availability of in person appointments. I also discussed with the patient that there may be a patient responsible charge related to this service. The patient expressed understanding and agreed to proceed.      I discussed the assessment and treatment plan with the patient. The patient was provided an opportunity to ask questions and all were answered. The patient agreed with the plan and demonstrated an understanding of the instructions.   The patient was advised to call back or seek an in-person evaluation if the symptoms worsen or if the condition fails to improve as anticipated.  I provided 15 minutes of non-face-to-face time during this encounter.   Levonne Spiller, MD  [facility] MD/PA/NP OP Progress Note  08/13/2018 1:32 PM Jamie Pitts  MRN:  [id number]  Chief Complaint:  Chief Complaint    Depression; Paranoid; Hallucinations; Follow-up    This patient is a 85 year old married white female who lives with her husband in [place]. She has one daughter and 3 grandchildren. She is retired from the [employer].  Apparently the patient had some sort of psychotic episode in 2013. She was admitted to [facility] because she was hearing voices at home. The voices got so bad that she cannot put a gun under the bed and wanted to shoot him. The patient is a poor historian and is hard of hearing and gets easily confused. She was placed on various medicines in the hospital which have been continued. It's unclear if she is medication compliant. For example she's not taking the Cogentin. She repeats herself a lot.  The patient returns for follow-up after about 1  month.  She had become increasingly depressed unable to function was not getting out of bed and hallucinating in the beginning of this month.  She was brought to the emergency room by her family on 07/23/2018 and involuntary committed to [place] [facility] [place].  She just got back 2 days ago so she was there about 2 weeks.  I spoke with the patient and her daughter today by phone because of the coronavirus pandemic.  The patient states that she is feeling better.  She is now on Risperdal 1.5 mg twice daily and the voices have come down but they are still present at times.  She is getting out of bed she is moving around she is eating her food.  Her daughter states that she fifth seems to be a lot more  Coherent.  She still has confusion about taking her medication so I strongly suggested they get a home health nurse to come in and talk to primary care about this.  I also suggested that they get the pharmacy to bubble pack her medications if possible.  For example she did not take her medicines last night that her daughter said out and claims she took him out of the bottles instead.  The patient currently denies being depressed or having suicidal ideation but admits that she still hears "voices singing".  They do not seem to be bothering her as much as they did in the past HPI:  Visit Diagnosis:    ICD-10-CM   1. ANXIETY DEPRESSION  F34.1   2. Psychosis, unspecified psychosis  type [facility])  [place]     Past Psychiatric History: Prior hospitalization in 2013 for auditory hallucinations and depression  Past Medical History:  Past Medical History:  Diagnosis Date  . Adenomatous polyp    x1  . ALLERGIC RHINITIS 12/20/2006  . Chronic anxiety   . Chronic back pain 04/14/2007   02/2017 [facility] records reviewed - mild lumbar DDD and L SIJ pain s/p ESI latest 10/2016 with good effect  . Diverticulitis   . GERD (gastroesophageal reflux disease)    intermittent, treated with tums  . Hiatal hernia   . Hypothyroidism    . Insomnia   . Osteoporosis 2007   declined prolia  . Perforation of right tympanic membrane 09/03/2015   Chronic, seen by Dr Janace Hoard ENT   . Personal history of kidney stones    x1  . Postmenopausal atrophic vaginitis 2016   rec estrace cream - [place]  . Recurrent UTI   . Scoliosis   . Severe recurrent depression with psychosis ([place])   . Urinary retention   . Vitamin D deficiency     Past Surgical History:  Procedure Laterality Date  . ABDOMINAL HYSTERECTOMY  1979   ectopic pregnancy with one ovary removed  . APPENDECTOMY  1954  . CATARACT EXTRACTION, BILATERAL  11/2015  . CHOLECYSTECTOMY    . ESOPHAGOGASTRODUODENOSCOPY    . KNEE SURGERY Left 2012   L medial meniscal tear  . OOPHORECTOMY Right 1965   Ectopic pregnancy, removal of right ovary and tube- 1960  . ROTATOR CUFF REPAIR Right   . TONSILLECTOMY      Family Psychiatric History: See below  Family History:  Family History  Problem Relation Age of Onset  . Anxiety disorder Grandchild   . Depression Grandchild   . Cancer Sister        lung  . Healthy Sister   . Diabetes Mother   . Diabetes Father   . Schizophrenia Sister   . Diabetes Sister   . Diabetes Brother   . Cancer Sister        kidney  . Alcohol abuse Neg Hx   . Bipolar disorder Neg Hx   . Dementia Neg Hx   . Drug abuse Neg Hx   . OCD Neg Hx   . Paranoid behavior Neg Hx   . Seizures Neg Hx   . Sexual abuse Neg Hx   . Physical abuse Neg Hx     Social History:  Social History   Socioeconomic History  . Marital status: Married    Spouse name: Not on file  . Number of children: 1  . Years of education: Not on file  . Highest education level: Not on file  Occupational History  . Occupation: retired Market researcher  Social Needs  . Financial resource strain: Patient refused  . Food insecurity    Worry: Patient refused    Inability: Patient refused  . Transportation needs    Medical: Patient refused    Non-medical: Patient refused   Tobacco Use  . Smoking status: Never Smoker  . Smokeless tobacco: Never Used  Substance and Sexual Activity  . Alcohol use: No  . Drug use: No  . Sexual activity: Not Currently    Birth control/protection: Diaphragm  Lifestyle  . Physical activity    Days per week: Patient refused    Minutes per session: Patient refused  . Stress: Patient refused  Relationships  . Social connections    Talks on phone: Patient refused  Gets together: Patient refused    Attends religious service: Patient refused    Active member of club or organization: Patient refused    Attends meetings of clubs or organizations: Patient refused    Relationship status: Patient refused  Other Topics Concern  . Not on file  Social History Narrative   Lives with husband and grandson [name])   Occ: retired, Sales executive   Edu: College          Allergies:  Allergies  Allergen Reactions  . Amoxicillin Hives    Sores in mouth  . Buprenorphine Hcl Itching    Can tolerate if necessary Can tolerate if necessary Can tolerate if necessary  . Morphine Itching    Can tolerate if necessary  . Morphine And Related Itching    Can tolerate if necessary  . Sulfa Antibiotics Hives    Sores in mouth Sores in mouth   . Sulfonamide Derivatives Hives    Sores in mouth  . Valium [Diazepam] Itching    Can tolerate if necessary    Metabolic Disorder Labs: No results found for: HGBA1C, MPG No results found for: PROLACTIN Lab Results  Component Value Date   CHOL 208 (H) 11/18/2016   TRIG 248.0 (H) 11/18/2016   HDL 43.10 11/18/2016   CHOLHDL 5 11/18/2016   VLDL 49.6 (H) 11/18/2016   Lab Results  Component Value Date   TSH 3.24 05/04/2018   TSH 0.45 01/29/2018    Therapeutic Level Labs: No results found for: LITHIUM No results found for: VALPROATE No components found for:  CBMZ  Current Medications: Current Outpatient Medications  Medication Sig Dispense Refill  . clonazePAM (KLONOPIN) 1 MG tablet  Take one qam and 2 at bedtime (Patient taking differently: Take 0.5-1 mg by mouth 2 (two) times daily. Take 0.5 mg bid and 1 mg at bedtime) 90 tablet 2  . HYDROcodone-acetaminophen (NORCO) 7.5-325 MG tablet Take 1 tablet by mouth 4 (four) times daily as needed.    . lamoTRIgine (LAMICTAL) 100 MG tablet Take 1 tablet (100 mg total) by mouth 2 (two) times daily. 60 tablet 2  . levothyroxine (SYNTHROID) 75 MCG tablet 1 tablet before breakfast daily except half tablet on Sundays 90 tablet 1  . meloxicam (MOBIC) 7.5 MG tablet Take 7.5 mg by mouth 2 (two) times daily as needed.    . naloxone (NARCAN) nasal spray 4 mg/0.1 mL Place 1 spray into the nose once.    . risperiDONE (RISPERDAL) 3 MG tablet Take 0.5 tablets (1.5 mg total) by mouth 2 (two) times daily. 30 tablet 2  . traMADol (ULTRAM) 50 MG tablet Take 1 tablet by mouth 2 (two) times a day.    . traZODone (DESYREL) 150 MG tablet Take 1 tablet (150 mg total) by mouth at bedtime. 30 tablet 2  . venlafaxine XR (EFFEXOR XR) 75 MG 24 hr capsule Take 1 capsule (75 mg total) by mouth daily. 90 capsule 2   No current facility-administered medications for this visit.      Musculoskeletal: Strength & Muscle Tone: decreased Gait & Station: unsteady Patient leans: N/A  Psychiatric Specialty Exam: Review of Systems  Musculoskeletal: Positive for falls and joint pain.  Psychiatric/Behavioral: Positive for hallucinations.  All other systems reviewed and are negative.   Last menstrual period 07/15/2012.There is no height or weight on file to calculate BMI.  General Appearance: NA  Eye Contact:  NA  Speech:  Clear and Coherent  Volume:  Normal  Mood:  Anxious  Affect:  NA  Thought Process:  Goal Directed  Orientation:  Full (Time, Place, and Person)  Thought Content: Hallucinations: Auditory   Suicidal Thoughts:  No  Homicidal Thoughts:  No  Memory:  Immediate;   Good Recent;   Fair Remote;   Poor  Judgement:  Poor  Insight:  Shallow   Psychomotor Activity:  Decreased  Concentration:  Concentration: Fair and Attention Span: Fair  Recall:  [employer] of Knowledge: Fair  Language: Good  Akathisia:  No  Handed:  Right  AIMS (if indicated): not done  Assets:  Communication Skills Desire for Improvement Resilience Social Support Talents/Skills  ADL's:  Intact  Cognition: Impaired,  Mild  Sleep:  Good   Screenings: GAD-7     Office Visit from 04/28/2018 in [place] [facility] [place] from 11/18/2016 in [place] at [facility]  Total Score (max 30 points )  20    PHQ2-9     Office Visit from 04/28/2018 in [place] at [facility] from 11/18/2016 in [place] at [facility] Total Score  6  1  PHQ-9 Total Score  14  2       Assessment and Plan: This patient is an 85 year old female with a history of depression anxiety and psychotic symptoms.  She is probably also undergoing some early dementia as she is getting more confused.  For now she will continue the medications from her recent hospitalization- Lamictal 100 mg twice daily for mood stabilization, Risperdal 1.5 mg twice daily for psychotic symptoms, trazodone 150 mg at bedtime for sleep and Effexor XR 75 mg every morning for depression.  She will return to see me in 4 weeks   Levonne Spiller, MD 08/13/2018, 1:32 PM

## 2018-08-13 NOTE — Assessment & Plan Note (Signed)
Overall feeling well. Pt unsure what she is taking other than recently starting risperdal by behavioral health. She does have psych f/u tomorrow. I have asked her to drop off discharge summary copy to update discharge medication list.

## 2018-08-13 NOTE — Assessment & Plan Note (Signed)
Supportive care reviewed, encouraged in-office eval if not improving with time.

## 2018-08-18 ENCOUNTER — Telehealth: Payer: Self-pay | Admitting: Family Medicine

## 2018-08-18 NOTE — Telephone Encounter (Signed)
Noted. I assume HH order was initiated in behavioral health hospital.

## 2018-08-18 NOTE — Telephone Encounter (Signed)
Best number [phone] Darl[facility] Guys @ [facility] called to leave message  They received orders for pt and they will start seeing her on 7/5

## 2018-08-18 NOTE — Telephone Encounter (Signed)
Noted  

## 2018-08-23 DIAGNOSIS — Z86010 Personal history of colonic polyps: Secondary | ICD-10-CM | POA: Diagnosis not present

## 2018-08-23 DIAGNOSIS — E559 Vitamin D deficiency, unspecified: Secondary | ICD-10-CM | POA: Diagnosis not present

## 2018-08-23 DIAGNOSIS — M810 Age-related osteoporosis without current pathological fracture: Secondary | ICD-10-CM | POA: Diagnosis not present

## 2018-08-23 DIAGNOSIS — K219 Gastro-esophageal reflux disease without esophagitis: Secondary | ICD-10-CM | POA: Diagnosis not present

## 2018-08-23 DIAGNOSIS — E039 Hypothyroidism, unspecified: Secondary | ICD-10-CM | POA: Diagnosis not present

## 2018-08-23 DIAGNOSIS — M419 Scoliosis, unspecified: Secondary | ICD-10-CM | POA: Diagnosis not present

## 2018-08-23 DIAGNOSIS — F411 Generalized anxiety disorder: Secondary | ICD-10-CM | POA: Diagnosis not present

## 2018-08-23 DIAGNOSIS — J449 Chronic obstructive pulmonary disease, unspecified: Secondary | ICD-10-CM | POA: Diagnosis not present

## 2018-08-23 DIAGNOSIS — G4700 Insomnia, unspecified: Secondary | ICD-10-CM | POA: Diagnosis not present

## 2018-08-23 DIAGNOSIS — F323 Major depressive disorder, single episode, severe with psychotic features: Secondary | ICD-10-CM | POA: Diagnosis not present

## 2018-08-23 DIAGNOSIS — E441 Mild protein-calorie malnutrition: Secondary | ICD-10-CM | POA: Diagnosis not present

## 2018-08-24 ENCOUNTER — Telehealth: Payer: Self-pay

## 2018-08-24 NOTE — Telephone Encounter (Signed)
Agree with [facility] nursing verbal orders 1 x a wk for 3 wks for med mgt.   As far as B12:  I don't know - never received any records from behavioral health.  Would need to review their records and vit b12 levels prior to deciding on treatment - can we get discharge summary from [facility]?

## 2018-08-24 NOTE — Telephone Encounter (Signed)
Estill Bamberg nurse with [facility] left v/m; pt was discharged from [facility] on 08/11/18. Pt was order B 12 injections q 2 wks ,Estill Bamberg assumes ordered by physician at behavorial health and Estill Bamberg is not sure if Dr Darnell Level wants to continue Vit B12 injections q2 wks. Amanda left v/m requesting [place] nursing verbal orders 1 x a wk for 3 wks for med mgt. If Dr Darnell Level wants [facility] to give pt Vit B 12 injections q 2 wks please give verbal order once every 2 wks for 5 wks for B12 injections.Please advise.

## 2018-08-25 NOTE — Telephone Encounter (Signed)
Spoke with Estill Bamberg informing her Dr. Darnell Level is giving verbal order for services requested.  Also, relayed Dr. Synthia Innocent message about vit B12.  Amanda verbalizes understanding.   Left message on vm for Tiffany (MRs) at [facility] asking her to fax pt's MR and labs.

## 2018-08-28 DIAGNOSIS — M419 Scoliosis, unspecified: Secondary | ICD-10-CM | POA: Diagnosis not present

## 2018-08-28 DIAGNOSIS — M810 Age-related osteoporosis without current pathological fracture: Secondary | ICD-10-CM | POA: Diagnosis not present

## 2018-08-28 DIAGNOSIS — F411 Generalized anxiety disorder: Secondary | ICD-10-CM | POA: Diagnosis not present

## 2018-08-28 DIAGNOSIS — K219 Gastro-esophageal reflux disease without esophagitis: Secondary | ICD-10-CM | POA: Diagnosis not present

## 2018-08-28 DIAGNOSIS — J449 Chronic obstructive pulmonary disease, unspecified: Secondary | ICD-10-CM | POA: Diagnosis not present

## 2018-08-28 DIAGNOSIS — F323 Major depressive disorder, single episode, severe with psychotic features: Secondary | ICD-10-CM | POA: Diagnosis not present

## 2018-08-31 NOTE — Telephone Encounter (Signed)
Left message on vm for Tiffany (MRs) at [facility] at [phone] asking her to fax pt's MR and labs.

## 2018-09-01 NOTE — Telephone Encounter (Signed)
Faxed MR request to ATTN:  Tiffany at [facility] at [phone].

## 2018-09-04 ENCOUNTER — Telehealth: Payer: Self-pay

## 2018-09-04 DIAGNOSIS — E039 Hypothyroidism, unspecified: Secondary | ICD-10-CM | POA: Diagnosis not present

## 2018-09-04 DIAGNOSIS — F323 Major depressive disorder, single episode, severe with psychotic features: Secondary | ICD-10-CM | POA: Diagnosis not present

## 2018-09-04 DIAGNOSIS — K219 Gastro-esophageal reflux disease without esophagitis: Secondary | ICD-10-CM | POA: Diagnosis not present

## 2018-09-04 DIAGNOSIS — M419 Scoliosis, unspecified: Secondary | ICD-10-CM | POA: Diagnosis not present

## 2018-09-04 DIAGNOSIS — F333 Major depressive disorder, recurrent, severe with psychotic symptoms: Secondary | ICD-10-CM

## 2018-09-04 DIAGNOSIS — M810 Age-related osteoporosis without current pathological fracture: Secondary | ICD-10-CM | POA: Diagnosis not present

## 2018-09-04 DIAGNOSIS — G4700 Insomnia, unspecified: Secondary | ICD-10-CM | POA: Diagnosis not present

## 2018-09-04 DIAGNOSIS — F411 Generalized anxiety disorder: Secondary | ICD-10-CM

## 2018-09-04 DIAGNOSIS — J449 Chronic obstructive pulmonary disease, unspecified: Secondary | ICD-10-CM | POA: Diagnosis not present

## 2018-09-04 DIAGNOSIS — E441 Mild protein-calorie malnutrition: Secondary | ICD-10-CM | POA: Diagnosis not present

## 2018-09-04 DIAGNOSIS — E559 Vitamin D deficiency, unspecified: Secondary | ICD-10-CM | POA: Diagnosis not present

## 2018-09-04 DIAGNOSIS — Z86010 Personal history of colonic polyps: Secondary | ICD-10-CM

## 2018-09-04 NOTE — Telephone Encounter (Signed)
Noted  

## 2018-09-04 NOTE — Telephone Encounter (Signed)
Lysle Dingwall RN with [facility] left v/m that pt has refused [place] visits 2 days in a row. Today pt said she doesn't feel well and feeling tired and did not want [employer] visit today. Jolene said pts husband said that pts condition has not changed. Jolene said any new orders please call Jolene but otherwise no cb needed.

## 2018-09-07 ENCOUNTER — Other Ambulatory Visit: Payer: Self-pay | Admitting: Family Medicine

## 2018-09-07 ENCOUNTER — Encounter: Payer: Self-pay | Admitting: Family Medicine

## 2018-09-07 DIAGNOSIS — G894 Chronic pain syndrome: Secondary | ICD-10-CM | POA: Diagnosis not present

## 2018-09-07 DIAGNOSIS — E559 Vitamin D deficiency, unspecified: Secondary | ICD-10-CM | POA: Insufficient documentation

## 2018-09-07 DIAGNOSIS — E538 Deficiency of other specified B group vitamins: Secondary | ICD-10-CM | POA: Insufficient documentation

## 2018-09-07 DIAGNOSIS — M5136 Other intervertebral disc degeneration, lumbar region: Secondary | ICD-10-CM | POA: Diagnosis not present

## 2018-09-07 DIAGNOSIS — M545 Low back pain: Secondary | ICD-10-CM | POA: Diagnosis not present

## 2018-09-07 DIAGNOSIS — Z79891 Long term (current) use of opiate analgesic: Secondary | ICD-10-CM | POA: Diagnosis not present

## 2018-09-07 NOTE — Telephone Encounter (Signed)
Jolene with [facility] called and stated patient have refuse [place] visit again. This time patient told them not to reschedule and do not come to her home.  Jolene asked if Dr Danise Mina would give the okay to discharge the order for [facility] visits or should they try again? Please advise.

## 2018-09-07 NOTE — Progress Notes (Signed)
D/c during beh health hospitalization

## 2018-09-08 NOTE — Telephone Encounter (Signed)
Noted. Ok to discharge early.

## 2018-09-08 NOTE — Telephone Encounter (Signed)
Spoke with Jolene, aware of recommendations per Dr Darnell Level  Nothing further needed.

## 2018-09-28 ENCOUNTER — Emergency Department (HOSPITAL_COMMUNITY): Payer: Medicare Other

## 2018-09-28 ENCOUNTER — Other Ambulatory Visit: Payer: Self-pay

## 2018-09-28 ENCOUNTER — Encounter (HOSPITAL_COMMUNITY): Payer: Self-pay | Admitting: Emergency Medicine

## 2018-09-28 ENCOUNTER — Ambulatory Visit: Payer: Self-pay | Admitting: *Deleted

## 2018-09-28 ENCOUNTER — Emergency Department (HOSPITAL_COMMUNITY)
Admission: EM | Admit: 2018-09-28 | Discharge: 2018-09-28 | Disposition: A | Discharge status: Home / Self Care | Payer: Medicare Other | Attending: Emergency Medicine | Admitting: Emergency Medicine

## 2018-09-28 DIAGNOSIS — R0902 Hypoxemia: Secondary | ICD-10-CM | POA: Diagnosis not present

## 2018-09-28 DIAGNOSIS — F29 Unspecified psychosis not due to a substance or known physiological condition: Secondary | ICD-10-CM | POA: Diagnosis not present

## 2018-09-28 DIAGNOSIS — R531 Weakness: Secondary | ICD-10-CM | POA: Diagnosis not present

## 2018-09-28 DIAGNOSIS — S199XXA Unspecified injury of neck, initial encounter: Secondary | ICD-10-CM | POA: Diagnosis not present

## 2018-09-28 DIAGNOSIS — M25552 Pain in left hip: Secondary | ICD-10-CM | POA: Diagnosis not present

## 2018-09-28 DIAGNOSIS — S299XXA Unspecified injury of thorax, initial encounter: Secondary | ICD-10-CM | POA: Diagnosis not present

## 2018-09-28 DIAGNOSIS — J449 Chronic obstructive pulmonary disease, unspecified: Secondary | ICD-10-CM | POA: Insufficient documentation

## 2018-09-28 DIAGNOSIS — S3993XA Unspecified injury of pelvis, initial encounter: Secondary | ICD-10-CM | POA: Diagnosis not present

## 2018-09-28 DIAGNOSIS — E039 Hypothyroidism, unspecified: Secondary | ICD-10-CM | POA: Insufficient documentation

## 2018-09-28 DIAGNOSIS — S0990XA Unspecified injury of head, initial encounter: Secondary | ICD-10-CM | POA: Diagnosis not present

## 2018-09-28 DIAGNOSIS — W19XXXA Unspecified fall, initial encounter: Secondary | ICD-10-CM | POA: Diagnosis not present

## 2018-09-28 DIAGNOSIS — Z79899 Other long term (current) drug therapy: Secondary | ICD-10-CM | POA: Insufficient documentation

## 2018-09-28 DIAGNOSIS — M25551 Pain in right hip: Secondary | ICD-10-CM | POA: Insufficient documentation

## 2018-09-28 DIAGNOSIS — I959 Hypotension, unspecified: Secondary | ICD-10-CM | POA: Diagnosis not present

## 2018-09-28 DIAGNOSIS — R197 Diarrhea, unspecified: Secondary | ICD-10-CM | POA: Diagnosis not present

## 2018-09-28 LAB — BASIC METABOLIC PANEL
Anion gap: 11 (ref 5–15)
BUN: 11 mg/dL (ref 8–23)
CO2: 21 mmol/L — ABNORMAL LOW (ref 22–32)
Calcium: 8.5 mg/dL — ABNORMAL LOW (ref 8.9–10.3)
Chloride: 95 mmol/L — ABNORMAL LOW (ref 98–111)
Creatinine, Ser: 1.05 mg/dL — ABNORMAL HIGH (ref 0.44–1.00)
GFR calc Af Amer: 57 mL/min — ABNORMAL LOW (ref >60)
GFR calc non Af Amer: 49 mL/min — ABNORMAL LOW (ref >60)
Glucose, Bld: 107 mg/dL — ABNORMAL HIGH (ref 70–99)
Potassium: 4.2 mmol/L (ref 3.5–5.1)
Sodium: 127 mmol/L — ABNORMAL LOW (ref 135–145)

## 2018-09-28 LAB — CBC
HCT: 32.6 % — ABNORMAL LOW (ref 36.0–46.0)
Hemoglobin: 10.2 g/dL — ABNORMAL LOW (ref 12.0–15.0)
MCH: 27.2 pg (ref 26.0–34.0)
MCHC: 31.3 g/dL (ref 30.0–36.0)
MCV: 86.9 fL (ref 80.0–100.0)
Platelets: 455 10*3/uL — ABNORMAL HIGH (ref 150–400)
RBC: 3.75 MIL/uL — ABNORMAL LOW (ref 3.87–5.11)
RDW: 13.7 % (ref 11.5–15.5)
WBC: 13.1 10*3/uL — ABNORMAL HIGH (ref 4.0–10.5)
nRBC: 0 % (ref 0.0–0.2)

## 2018-09-28 LAB — T4, FREE: Free T4: 0.85 ng/dL (ref 0.61–1.12)

## 2018-09-28 LAB — CK: Total CK: 271 U/L — ABNORMAL HIGH (ref 38–234)

## 2018-09-28 LAB — TSH: TSH: 21.304 u[IU]/mL — ABNORMAL HIGH (ref 0.350–4.500)

## 2018-09-28 MED ORDER — LOPERAMIDE HCL 2 MG PO TABS: 2.0000 mg | ORAL_TABLET | Freq: Four times a day (QID) | ORAL | 0 refills | Status: DC | PRN

## 2018-09-28 MED ORDER — SODIUM CHLORIDE 0.9 % IV BOLUS
1000.0000 mL | Freq: Once | INTRAVENOUS | Status: AC
  Administered 2018-09-28: 1000 mL via INTRAVENOUS

## 2018-09-28 NOTE — Telephone Encounter (Signed)
Noted  

## 2018-09-28 NOTE — Telephone Encounter (Signed)
Patient's GD, Jamie Pitts calls with patient too weak to stand and walk. She has had several falls over the last 4 days. Stated she is alert and voices she is in tremendous pain. Not eating or drinking enough now. Recent stay in physiatric unit in [place]. Been home approximately 30 days. Is unsure of any fever.Advised 911 at this time for immediate emergency evaluation. Stated she would call now. Routing to PCP.   Reason for Disposition . Patient sounds very sick or weak to the triager  Protocols used: WEAKNESS (GENERALIZED) AND FATIGUE-A-AH

## 2018-09-28 NOTE — ED Notes (Signed)
Patient verbalizes understanding of discharge instructions. Opportunity for questioning and answers were provided. Armband removed by staff, pt discharged from ED via wheelchair to home.  

## 2018-09-28 NOTE — ED Notes (Signed)
Pt cleaned. Changed. Purewick applied.

## 2018-09-28 NOTE — ED Triage Notes (Signed)
[facility]- pt from home. Family states pt has had some generalized weakness for the past week. Also had some diarrhea. Pt has dementia and is at baseline per family.    CBG 135 108/57 BP 96 HR

## 2018-09-28 NOTE — Discharge Instructions (Addendum)
Today you were seen for hip pain that you had after falling at home. We did a CT scan of your head and neck as well as an X-ray of your pelvis and there were no injuries seen. We also performed lab tests to check for infection and these were normal.  Your TSH was elevated and this could indicate that you are missing doses of your synthroid medication. You can also discuss with your primary care provider if the dose of this medication should be adjusted.  We have placed an order for home health physical therapy and nursing services. They will be in contact with you to setup these services.  Please return to the ER for any new or concerning symptoms including severe pain, shortness of breath, and new or worsening weakness.  Thank you for allowing [place] to be a part of your medical care

## 2018-09-28 NOTE — ED Provider Notes (Signed)
[facility] EMERGENCY DEPARTMENT Provider Note   CSN: [id number] Arrival date & time: 09/28/18  1159    History   Chief Complaint No chief complaint on file.   HPI Jamie Pitts is a 85 y.o. female with past medical history of hypothyroidism, GERD, depression, anxiety presents with chief complaint of fatigue and weakness.  Patient reports that her fatigue has been going on for a very long time, she is not sure when it started.  Patient says her family wanted her to come to the emergency room because she has been falling more recently.  She says her first fall occurred back in June and she has most recently fallen on Saturday and on Sunday.  Patient states she was by herself when she fell and that she thinks she might have hit her head but she is not in any pain.  Patient denies feeling dizzy or lightheaded when she stands up but says that her legs are too weak and she falls.    Patient reports she has been feeling more depressed lately with a lot of things going on, but denies thoughts of self-harm.  Patient denies cold intolerance, dysuria, nausea, vomiting, diarrhea, chest pain, shortness of breath.  Patient reports that she takes her levothyroxine medication consistently and misses about 1 dose per week.  Patient states that she lives with her husband who has Parkinson's but her grandkids who are in their 18s are around frequently.  Patient reports that she drink fluids this morning but reports that she has not eaten food in a long time, unable to remember time of last intake.  She says she just has not been hungry recently.    Spoke with daughter, Ardyth Gal ([phone]) who says that patient has had hip pain following her fall on Saturday. Daughter states that patient has not been eating or drinking much lately which could be contributing to her falls. Daughter thinks patient may benefit from home physical therapy.  HPI  Past Medical History:  Diagnosis Date    Adenomatous polyp    x1   ALLERGIC RHINITIS 12/20/2006   Chronic anxiety    Chronic back pain 04/14/2007   02/2017 [facility] records reviewed - mild lumbar DDD and L SIJ pain s/p ESI latest 10/2016 with good effect   Diverticulitis    GERD (gastroesophageal reflux disease)    intermittent, treated with tums   Hiatal hernia    Hypothyroidism    Insomnia    Osteoporosis 2007   declined prolia   Perforation of right tympanic membrane 09/03/2015   Chronic, seen by Dr Janace Hoard ENT    Personal history of kidney stones    x1   Postmenopausal atrophic vaginitis 2016   rec estrace cream - [place]   Recurrent UTI    Scoliosis    Severe recurrent depression with psychosis ([place])    Urinary retention    Vitamin D deficiency     Patient Active Problem List   Diagnosis Date Noted   Vitamin B12 deficiency 09/07/2018   Vitamin D deficiency 09/07/2018   Bilateral leg pain 08/13/2018   Psychosis in elderly ([place]) 07/24/2018   Malnutrition of moderate degree 05/20/2017   Fecal impaction ([place]) 05/18/2017   Abdominal pain 05/18/2017   Long-term current use of opiate analgesic 03/06/2017   Chronic pain syndrome 03/06/2017   Nausea 03/04/2017   Abdominal cramping 05/24/2016   Perforation of right tympanic membrane 09/03/2015   COPD (chronic obstructive pulmonary disease) ([place]) 12/07/2014   Constipation  05/08/2014   Urinary retention 05/08/2014   Urinary urgency 04/05/2014   Chronic cough 03/02/2013   Dysphagia 11/05/2012   Drug-induced Parkinsonism ([place]) 07/29/2012   Insomnia secondary to depression with anxiety 05/22/2012   Chronic low back pain 04/14/2007   Hypothyroidism 11/04/2006   GERD 11/04/2006   OSTEOPOROSIS 11/04/2006   Depression, major, recurrent, severe with psychosis ([place]) 08/29/2006    Past Surgical History:  Procedure Laterality Date   ABDOMINAL HYSTERECTOMY  1979   ectopic pregnancy with one ovary removed   [place], BILATERAL  11/2015   CHOLECYSTECTOMY     ESOPHAGOGASTRODUODENOSCOPY     KNEE SURGERY Left 2012   L medial meniscal tear   OOPHORECTOMY Right 1965   Ectopic pregnancy, removal of right ovary and tube- 1960   ROTATOR CUFF REPAIR Right    TONSILLECTOMY       OB History   No obstetric history on file.      Home Medications    Prior to Admission medications   Medication Sig Start Date End Date Taking? Authorizing Provider  HYDROcodone-acetaminophen (NORCO) 7.5-325 MG tablet Take 1 tablet by mouth 4 (four) times daily as needed. 06/18/18   [provider]  lamoTRIgine (LAMICTAL) 100 MG tablet Take 1 tablet (100 mg total) by mouth 2 (two) times daily. 08/13/18 08/13/19  Cloria Spring, MD  levothyroxine (SYNTHROID) 75 MCG tablet 1 tablet before breakfast daily except half tablet on Sundays 06/18/18   Elayne Snare, MD  meloxicam (MOBIC) 7.5 MG tablet Take 7.5 mg by mouth 2 (two) times daily as needed. 06/29/18   [provider]  naloxone [facility]) nasal spray 4 mg/0.1 mL Place 1 spray into the nose once.    [provider]  risperiDONE (RISPERDAL) 3 MG tablet Take 0.5 tablets (1.5 mg total) by mouth 2 (two) times daily. 08/13/18   Cloria Spring, MD  traMADol (ULTRAM) 50 MG tablet Take 1 tablet by mouth 2 (two) times a day. 08/11/18   [provider]  traZODone (DESYREL) 150 MG tablet Take 1 tablet (150 mg total) by mouth at bedtime. 08/13/18   Cloria Spring, MD  venlafaxine XR (EFFEXOR XR) 75 MG 24 hr capsule Take 1 capsule (75 mg total) by mouth daily. 08/13/18 08/13/19  Cloria Spring, MD    Family History Family History  Problem Relation Age of Onset   Anxiety disorder Grandchild    Depression Grandchild    Cancer Sister        lung   Healthy Sister    Diabetes Mother    Diabetes Father    Schizophrenia Sister    Diabetes Sister    Diabetes Brother    Cancer Sister        kidney   Alcohol abuse Neg Hx     Bipolar disorder Neg Hx    Dementia Neg Hx    Drug abuse Neg Hx    OCD Neg Hx    Paranoid behavior Neg Hx    Seizures Neg Hx    Sexual abuse Neg Hx    Physical abuse Neg Hx     Social History Social History   Tobacco Use   Smoking status: Never Smoker   Smokeless tobacco: Never Used  Substance Use Topics   Alcohol use: No   Drug use: No     Allergies   Amoxicillin, Buprenorphine hcl, Morphine, Morphine and related, Sulfa antibiotics, Sulfonamide derivatives, and Valium [diazepam]   Review of  Systems Review of Systems  Respiratory: Negative for shortness of breath.   Cardiovascular: Negative for chest pain.  Gastrointestinal: Negative for diarrhea, nausea and vomiting.  Endocrine: Negative for cold intolerance.  Genitourinary: Negative for dysuria, frequency and urgency.  Neurological: Negative for dizziness.  Psychiatric/Behavioral: Negative for suicidal ideas.  All other systems reviewed and are negative.    Physical Exam Updated Vital Signs BP 129/64    Pulse 87    Temp 99.8 F (37.7 C) (Oral)    Resp 14    Ht 5\' 6"  (1.676 m)    Wt 77.6 kg    LMP 07/15/2012    SpO2 95%    BMI 27.60 kg/m   Physical Exam Constitutional:      Appearance: Normal appearance.  HENT:     Head: Normocephalic and atraumatic.     Mouth/Throat:     Mouth: Mucous membranes are dry.  Neck:     Musculoskeletal: Normal range of motion.  Cardiovascular:     Rate and Rhythm: Normal rate and regular rhythm.     Heart sounds: No murmur. No friction rub. No gallop.   Pulmonary:     Effort: Pulmonary effort is normal.     Breath sounds: Normal breath sounds. No wheezing, rhonchi or rales.  Abdominal:     General: Abdomen is flat. There is no distension.     Palpations: Abdomen is soft.     Tenderness: There is no abdominal tenderness. There is no guarding.  Musculoskeletal:        General: No swelling or tenderness.  Skin:    General: Skin is warm and dry.  Neurological:      General: No focal deficit present.     Mental Status: She is alert and oriented to person, place, and time.     Cranial Nerves: No cranial nerve deficit.     Motor: No weakness.     Comments: Diffuse weakness with 3/5 strength in bilateral upper and lower extremities. No focal deficits appreciated      ED Treatments / Results  Labs (all labs ordered are listed, but only abnormal results are displayed) Labs Reviewed  CBC - Abnormal; Notable for the following components:      Result Value   WBC 13.1 (*)    RBC 3.75 (*)    Hemoglobin 10.2 (*)    HCT 32.6 (*)    Platelets 455 (*)    All other components within normal limits  BASIC METABOLIC PANEL - Abnormal; Notable for the following components:   Sodium 127 (*)    Chloride 95 (*)    CO2 21 (*)    Glucose, Bld 107 (*)    Creatinine, Ser 1.05 (*)    Calcium 8.5 (*)    GFR calc non Af Amer 49 (*)    GFR calc Af Amer 57 (*)    All other components within normal limits  CK - Abnormal; Notable for the following components:   Total CK 271 (*)    All other components within normal limits  TSH - Abnormal; Notable for the following components:   TSH 21.304 (*)    All other components within normal limits  T4, FREE    EKG None  Radiology Dg Pelvis 1-2 Views  Result Date: 09/28/2018 CLINICAL DATA:  Bilateral hip pain after fall. EXAM: PELVIS - 1-2 VIEW COMPARISON:  None. FINDINGS: There is no evidence of pelvic fracture or diastasis. No pelvic bone lesions are seen. IMPRESSION: Negative. Electronically Signed  By: Kerby Moors M.D.   On: 09/28/2018 18:06   Ct Head Wo Contrast  Result Date: 09/28/2018 CLINICAL DATA:  Head trauma ataxia EXAM: CT HEAD WITHOUT CONTRAST; CT CERVICAL SPINE WITHOUT CONTRAST TECHNIQUE: Contiguous axial images were obtained from the base of the skull through the vertex without intravenous contrast. COMPARISON:  February 23, 2012. FINDINGS: Brain: No evidence of acute territorial infarction, hemorrhage,  hydrocephalus,extra-axial collection or mass lesion/mass effect. There is mild dilatation the ventricles and sulci consistent with age-related atrophy. Low-attenuation changes in the deep white matter consistent with small vessel ischemia. Vascular: No hyperdense vessel or unexpected calcification. Skull: The skull is intact. No fracture or focal lesion identified. Sinuses/Orbits: There is right maxillary sinus thickening. The orbits and globes intact. Other: None Cervical spine: Alignment: There is slight reversal of the normal cervical lordosis. A grade 1 anterolisthesis of C4 on C5 is seen measuring 2 mm. Skull base and vertebrae: Visualized skull base is intact. No atlanto-occipital dissociation. The vertebral body heights are well maintained. No fracture or pathologic osseous lesion seen. Soft tissues and spinal canal: The visualized paraspinal soft tissues are unremarkable. No prevertebral soft tissue swelling is seen. The spinal canal is unremarkable, no large epidural collection or significant canal narrowing. Disc levels: Uncovertebral osteophytes and disc osteophyte complexes are seen throughout the cervical spine most notable at C5-C6 and C6-C7 with moderate to severe neural foraminal narrowing. Upper chest: Nonspecific hazy airspace opacities seen within the right lung apex there is also biapical pleural scarring seen. Other: None IMPRESSION: 1. No acute intracranial abnormality. 2. Findings consistent with age related atrophy and chronic small vessel ischemia 3.  No acute fracture or malalignment of the spine. Electronically Signed   By: Prudencio Pair M.D.   On: 09/28/2018 15:20   Ct Cervical Spine Wo Contrast  Result Date: 09/28/2018 CLINICAL DATA:  Head trauma ataxia EXAM: CT HEAD WITHOUT CONTRAST; CT CERVICAL SPINE WITHOUT CONTRAST TECHNIQUE: Contiguous axial images were obtained from the base of the skull through the vertex without intravenous contrast. COMPARISON:  February 23, 2012. FINDINGS:  Brain: No evidence of acute territorial infarction, hemorrhage, hydrocephalus,extra-axial collection or mass lesion/mass effect. There is mild dilatation the ventricles and sulci consistent with age-related atrophy. Low-attenuation changes in the deep white matter consistent with small vessel ischemia. Vascular: No hyperdense vessel or unexpected calcification. Skull: The skull is intact. No fracture or focal lesion identified. Sinuses/Orbits: There is right maxillary sinus thickening. The orbits and globes intact. Other: None Cervical spine: Alignment: There is slight reversal of the normal cervical lordosis. A grade 1 anterolisthesis of C4 on C5 is seen measuring 2 mm. Skull base and vertebrae: Visualized skull base is intact. No atlanto-occipital dissociation. The vertebral body heights are well maintained. No fracture or pathologic osseous lesion seen. Soft tissues and spinal canal: The visualized paraspinal soft tissues are unremarkable. No prevertebral soft tissue swelling is seen. The spinal canal is unremarkable, no large epidural collection or significant canal narrowing. Disc levels: Uncovertebral osteophytes and disc osteophyte complexes are seen throughout the cervical spine most notable at C5-C6 and C6-C7 with moderate to severe neural foraminal narrowing. Upper chest: Nonspecific hazy airspace opacities seen within the right lung apex there is also biapical pleural scarring seen. Other: None IMPRESSION: 1. No acute intracranial abnormality. 2. Findings consistent with age related atrophy and chronic small vessel ischemia 3.  No acute fracture or malalignment of the spine. Electronically Signed   By: Prudencio Pair M.D.   On: 09/28/2018 15:20  Dg Chest Portable 1 View  Result Date: 09/28/2018 CLINICAL DATA:  Fall EXAM: PORTABLE CHEST 1 VIEW COMPARISON:  03/02/2013 FINDINGS: Cardiomediastinal silhouette is within normal limits. Tortuosity of the thoracic aorta. Minimal left basilar atelectasis. Lungs  otherwise clear. No pleural effusion. No pneumothorax. No acute osseous abnormality is identified. IMPRESSION: No acute cardiopulmonary findings. Electronically Signed   By: Davina Poke M.D.   On: 09/28/2018 13:13    Procedures Procedures (including critical care time)  Medications Ordered in ED Medications  sodium chloride 0.9 % bolus 1,000 mL (0 mLs Intravenous Stopped 09/28/18 1515)     Initial Impression / Assessment and Plan / ED Course  I have reviewed the triage vital signs and the nursing notes.  Pertinent labs & imaging results that were available during my care of the patient were reviewed by me and considered in my medical decision making (see chart for details).       Patient is an 85 year old female who presents with hip pain after a fall in the setting of generalized weakness.  CT head and cervical spine and XR pelvis without evidence for acute injury and CK without significant elevation.  Patient has no evidence for infection with stable white count, normal chest x-ray, and no symptoms of UTI. Patient volume depleted on exam with sodium of 127, NS bolus administered.  Generalized weakness may be multifactorial secondary to deconditioning, poor oral intake, and possible medication noncompliance with Synthroid given elevated TSH to 21.3 with normal T4. Consult placed to care management to setup home health with physical therapy.   Final Clinical Impressions(s) / ED Diagnoses   Final diagnoses:  Weakness    ED Discharge Orders         [place]     09/28/18 1836    Face-to-face encounter (required for Medicare/Medicaid patients)    Comments: I Jeanmarie Hubert certify that this patient is under my care and that I, or a nurse practitioner or physician's assistant working with me, had a face-to-face encounter that meets the physician face-to-face encounter requirements with this patient on 09/28/2018. The encounter with the patient was in whole, or in part  for the following medical condition(s) which is the primary reason for home health care (List medical condition): Additional orders from primary care   09/28/18 1836           Jeanmarie Hubert, MD 09/28/18 Ilsa Iha    Carmin Muskrat, MD 09/28/18 2005

## 2018-09-29 ENCOUNTER — Telehealth: Payer: Self-pay | Admitting: *Deleted

## 2018-09-29 ENCOUNTER — Telehealth (HOSPITAL_COMMUNITY): Payer: Self-pay | Admitting: Psychiatry

## 2018-09-29 ENCOUNTER — Telehealth (HOSPITAL_COMMUNITY): Payer: Self-pay | Admitting: *Deleted

## 2018-09-29 DIAGNOSIS — F03918 Unspecified dementia, unspecified severity, with other behavioral disturbance: Secondary | ICD-10-CM

## 2018-09-29 DIAGNOSIS — M545 Low back pain, unspecified: Secondary | ICD-10-CM

## 2018-09-29 DIAGNOSIS — R296 Repeated falls: Secondary | ICD-10-CM

## 2018-09-29 DIAGNOSIS — G8929 Other chronic pain: Secondary | ICD-10-CM

## 2018-09-29 DIAGNOSIS — M79605 Pain in left leg: Secondary | ICD-10-CM

## 2018-09-29 DIAGNOSIS — F0391 Unspecified dementia with behavioral disturbance: Secondary | ICD-10-CM

## 2018-09-29 DIAGNOSIS — M79604 Pain in right leg: Secondary | ICD-10-CM

## 2018-09-29 NOTE — Telephone Encounter (Signed)
plz notify home health referral placed.

## 2018-09-29 NOTE — Telephone Encounter (Signed)
Spoke to daughter, she will try to get skilled nursing in, but ultimately she may need NH

## 2018-09-29 NOTE — Telephone Encounter (Signed)
Patient's [name] not on [place]) left a voicemail stating that her mom had fallen 3 times last week and she went to the hospital. [name] stated that patient was released yesterday from the hospital. [name] stated that they really need some skilled nursing care at home. [name] stated that patient is not cooperating and is having problems with her bowels. [name] stated that she has reached out to her mom's psychiatrist and they recommended that she contact  Dr. Danise Mina to see if he can help with this.

## 2018-09-29 NOTE — Telephone Encounter (Signed)
Someone in the family daughter or granddaughter called " YELLING stating they need to speak with Dr Harrington Challenger. That she "is very sick & I think she is going to die".  I asked is this a medical or mental concern/issue? "She is very ill pooping on herself falling.  I said maybe you should call 911 to have her go to the ER. Her reply: didn't I just tell you they sent her home after she had went to the ER still  YELLING(none of this was every mentioned). I tried to speak while begin YELLED at saying I'm sorry I was just giving a suggestion -  She then replied: "you don't understand & you are yelling". Just let me speak to Dr Harrington Challenger so she can help me. I tried to inform her that Dr Harrington Challenger is in the middle of afternoon clinic & that I would send a message to Dr Harrington Challenger to ask to give you a call when she is available. STILL YELLING & NOW USING PROFANE LANGUAGE -- why don't you people want to help anybody is it because of the COVID-19? I replied this has nothing to due with COVID-19. YELLING her PCP won't help if Dr Harrington Challenger don't want to help(using profane language) then I'll find a doctor that will & hung up the phone while I was still on the other end.

## 2018-09-29 NOTE — Telephone Encounter (Signed)
Spoke with pt's daughter, [name] (not on dpr) and pt's husband, Juanda Crumble (on dpr), notifying them Dr. Darnell Level placed [facility] referral.  They both express their thanks.

## 2018-09-29 NOTE — Telephone Encounter (Signed)
I spoke to the daughter and she is looking at [facility] placement for her mom.The person who called you was the grandaughter who is not listed as an emergency contact. The daughter apologized for her daughtrer's behavior, stating that the whole family is very stressed

## 2018-09-30 NOTE — Telephone Encounter (Signed)
Jamie Pitts pts daughter (not on [place]) request cb with how soon can get [facility] into pts home. Jamie Pitts request cb ASAP.

## 2018-09-30 NOTE — Telephone Encounter (Signed)
Called patients home and was put on speaker with Mr Mangels, letting them know that we have sent a request out for the [facility] and we will let them know when an agency has accepted Mrs Diluzio.

## 2018-10-01 ENCOUNTER — Telehealth: Payer: Self-pay | Admitting: Family Medicine

## 2018-10-01 DIAGNOSIS — R627 Adult failure to thrive: Secondary | ICD-10-CM

## 2018-10-01 NOTE — Telephone Encounter (Signed)
Rosaria Ferries [facility] ask me to call Dr Danise Mina on cell to get verbal order for starting hospice and then call [facility] with order. Dr Danise Mina said yes to start hospice referral and he will be the attending. Dr Danise Mina asked if pt went to ED and I said from chart did not appear pt went to ED. Christie at [facility] was notified as instructed and voiced understanding and was appreciative and ask me to give the VO to [place] in triage which I did and Crystal voiced understanding. Adonis Brook does not think pt went to ED. FYI to Dr Danise Mina.

## 2018-10-01 NOTE — Telephone Encounter (Signed)
Best number [phone] Almyra Free (Granddaughter) called   [facility] can come out today to see pt if you can do the approval ASAP  [facility] phone [place]

## 2018-10-01 NOTE — Telephone Encounter (Signed)
Chrissy with [facility] left VM at triage they eval pt and they are requesting a verbal order to start hospice services also the family is in agreement with Hospice plan. If Dr. Danise Mina agrees with referral they just need a verbal order and also they need to know if Dr. Danise Mina will be the attending doctor over hospice services.   Chrissy's cb # [phone]

## 2018-10-01 NOTE — Telephone Encounter (Signed)
Patient's granddaughter called.  Patient went to [facility] ER on Monday.  Patient's severely weak, dehydrated, can't get out of bed, every hour she's defecating. Patient was sent home from the ER and her husband can't take care of her.  Patient fell 3 times before going to the hospital. Granddaughter is going to call 911 and have her sent to [facility]. They're interested in [facility] in [place]. Patient's granddaughter's requesting Dr.Gutierrez to call her.

## 2018-10-01 NOTE — Telephone Encounter (Signed)
Spoke with grand daughter Almyra Free (not on DPR), advised I could not talk with her about pt but she could share with me info/concerns.  Very weak, increased diarrhea, cough, trouble getting out of bed.  Seen by EMS today - pt refused ER eval.  No longer on hydrocodone - family took her off this.  Advised given how weak she sounds recommend ER eval - Almyra Free will try to get her to go to ER again

## 2018-10-02 ENCOUNTER — Other Ambulatory Visit: Payer: Self-pay

## 2018-10-02 MED ORDER — LOPERAMIDE HCL 2 MG PO TABS: 2.0000 mg | ORAL_TABLET | Freq: Four times a day (QID) | ORAL | 0 refills | Status: DC | PRN

## 2018-10-02 NOTE — Telephone Encounter (Signed)
Agree. Order placed in chart.

## 2018-10-02 NOTE — Telephone Encounter (Signed)
Sent locally

## 2018-10-02 NOTE — Telephone Encounter (Signed)
Received a call from the family requesting the Loperamide be sent to [facility] as ED Dr sent to mail order...Marland Kitchen please advise

## 2018-10-04 ENCOUNTER — Encounter (HOSPITAL_COMMUNITY): Payer: Self-pay | Admitting: Emergency Medicine

## 2018-10-04 ENCOUNTER — Emergency Department (HOSPITAL_COMMUNITY): Payer: Medicare Other

## 2018-10-04 ENCOUNTER — Inpatient Hospital Stay (HOSPITAL_COMMUNITY)
Admission: EM | Admit: 2018-10-04 | Discharge: 2018-10-10 | DRG: 175 | Disposition: A | Discharge status: Home Health | Payer: Medicare Other | Attending: Internal Medicine | Admitting: Internal Medicine

## 2018-10-04 ENCOUNTER — Inpatient Hospital Stay (HOSPITAL_COMMUNITY): Payer: Medicare Other

## 2018-10-04 ENCOUNTER — Other Ambulatory Visit: Payer: Self-pay

## 2018-10-04 DIAGNOSIS — I82402 Acute embolism and thrombosis of unspecified deep veins of left lower extremity: Secondary | ICD-10-CM | POA: Diagnosis present

## 2018-10-04 DIAGNOSIS — R000 Tachycardia, unspecified: Secondary | ICD-10-CM | POA: Diagnosis present

## 2018-10-04 DIAGNOSIS — G894 Chronic pain syndrome: Secondary | ICD-10-CM | POA: Diagnosis present

## 2018-10-04 DIAGNOSIS — T40605A Adverse effect of unspecified narcotics, initial encounter: Secondary | ICD-10-CM | POA: Diagnosis present

## 2018-10-04 DIAGNOSIS — J449 Chronic obstructive pulmonary disease, unspecified: Secondary | ICD-10-CM | POA: Diagnosis present

## 2018-10-04 DIAGNOSIS — E039 Hypothyroidism, unspecified: Secondary | ICD-10-CM | POA: Diagnosis present

## 2018-10-04 DIAGNOSIS — R296 Repeated falls: Secondary | ICD-10-CM | POA: Diagnosis present

## 2018-10-04 DIAGNOSIS — R197 Diarrhea, unspecified: Secondary | ICD-10-CM | POA: Diagnosis present

## 2018-10-04 DIAGNOSIS — M25551 Pain in right hip: Secondary | ICD-10-CM | POA: Diagnosis present

## 2018-10-04 DIAGNOSIS — Z9181 History of falling: Secondary | ICD-10-CM

## 2018-10-04 DIAGNOSIS — N390 Urinary tract infection, site not specified: Secondary | ICD-10-CM | POA: Diagnosis present

## 2018-10-04 DIAGNOSIS — M25552 Pain in left hip: Secondary | ICD-10-CM | POA: Diagnosis present

## 2018-10-04 DIAGNOSIS — F323 Major depressive disorder, single episode, severe with psychotic features: Secondary | ICD-10-CM | POA: Diagnosis present

## 2018-10-04 DIAGNOSIS — Z20828 Contact with and (suspected) exposure to other viral communicable diseases: Secondary | ICD-10-CM | POA: Diagnosis present

## 2018-10-04 DIAGNOSIS — M810 Age-related osteoporosis without current pathological fracture: Secondary | ICD-10-CM | POA: Diagnosis present

## 2018-10-04 DIAGNOSIS — F339 Major depressive disorder, recurrent, unspecified: Secondary | ICD-10-CM

## 2018-10-04 DIAGNOSIS — Z7189 Other specified counseling: Secondary | ICD-10-CM

## 2018-10-04 DIAGNOSIS — K219 Gastro-esophageal reflux disease without esophagitis: Secondary | ICD-10-CM | POA: Diagnosis present

## 2018-10-04 DIAGNOSIS — I2699 Other pulmonary embolism without acute cor pulmonale: Secondary | ICD-10-CM | POA: Diagnosis present

## 2018-10-04 DIAGNOSIS — R0902 Hypoxemia: Secondary | ICD-10-CM | POA: Diagnosis not present

## 2018-10-04 DIAGNOSIS — W19XXXA Unspecified fall, initial encounter: Secondary | ICD-10-CM | POA: Diagnosis not present

## 2018-10-04 DIAGNOSIS — S0990XA Unspecified injury of head, initial encounter: Secondary | ICD-10-CM | POA: Diagnosis present

## 2018-10-04 DIAGNOSIS — Z6825 Body mass index (BMI) 25.0-25.9, adult: Secondary | ICD-10-CM

## 2018-10-04 DIAGNOSIS — M549 Dorsalgia, unspecified: Secondary | ICD-10-CM | POA: Diagnosis present

## 2018-10-04 DIAGNOSIS — R531 Weakness: Secondary | ICD-10-CM | POA: Diagnosis not present

## 2018-10-04 DIAGNOSIS — R110 Nausea: Secondary | ICD-10-CM | POA: Diagnosis present

## 2018-10-04 DIAGNOSIS — R0602 Shortness of breath: Secondary | ICD-10-CM | POA: Diagnosis not present

## 2018-10-04 DIAGNOSIS — Z881 Allergy status to other antibiotic agents status: Secondary | ICD-10-CM

## 2018-10-04 DIAGNOSIS — Z882 Allergy status to sulfonamides status: Secondary | ICD-10-CM

## 2018-10-04 DIAGNOSIS — R627 Adult failure to thrive: Secondary | ICD-10-CM | POA: Diagnosis present

## 2018-10-04 DIAGNOSIS — S3993XA Unspecified injury of pelvis, initial encounter: Secondary | ICD-10-CM | POA: Diagnosis not present

## 2018-10-04 DIAGNOSIS — Z79890 Hormone replacement therapy (postmenopausal): Secondary | ICD-10-CM

## 2018-10-04 DIAGNOSIS — M419 Scoliosis, unspecified: Secondary | ICD-10-CM | POA: Diagnosis present

## 2018-10-04 DIAGNOSIS — G2119 Other drug induced secondary parkinsonism: Secondary | ICD-10-CM | POA: Diagnosis present

## 2018-10-04 DIAGNOSIS — Z79899 Other long term (current) drug therapy: Secondary | ICD-10-CM

## 2018-10-04 DIAGNOSIS — R0609 Other forms of dyspnea: Secondary | ICD-10-CM | POA: Diagnosis not present

## 2018-10-04 DIAGNOSIS — I82442 Acute embolism and thrombosis of left tibial vein: Secondary | ICD-10-CM | POA: Diagnosis not present

## 2018-10-04 DIAGNOSIS — Z66 Do not resuscitate: Secondary | ICD-10-CM | POA: Diagnosis present

## 2018-10-04 DIAGNOSIS — Z515 Encounter for palliative care: Secondary | ICD-10-CM | POA: Diagnosis present

## 2018-10-04 DIAGNOSIS — E871 Hypo-osmolality and hyponatremia: Secondary | ICD-10-CM | POA: Diagnosis present

## 2018-10-04 DIAGNOSIS — F329 Major depressive disorder, single episode, unspecified: Secondary | ICD-10-CM | POA: Diagnosis present

## 2018-10-04 DIAGNOSIS — F419 Anxiety disorder, unspecified: Secondary | ICD-10-CM | POA: Diagnosis present

## 2018-10-04 DIAGNOSIS — I2694 Multiple subsegmental pulmonary emboli without acute cor pulmonale: Secondary | ICD-10-CM | POA: Diagnosis not present

## 2018-10-04 DIAGNOSIS — Z888 Allergy status to other drugs, medicaments and biological substances status: Secondary | ICD-10-CM

## 2018-10-04 DIAGNOSIS — J9601 Acute respiratory failure with hypoxia: Secondary | ICD-10-CM | POA: Diagnosis present

## 2018-10-04 DIAGNOSIS — H9190 Unspecified hearing loss, unspecified ear: Secondary | ICD-10-CM | POA: Diagnosis present

## 2018-10-04 DIAGNOSIS — Z818 Family history of other mental and behavioral disorders: Secondary | ICD-10-CM

## 2018-10-04 DIAGNOSIS — K449 Diaphragmatic hernia without obstruction or gangrene: Secondary | ICD-10-CM | POA: Diagnosis present

## 2018-10-04 DIAGNOSIS — I824Z2 Acute embolism and thrombosis of unspecified deep veins of left distal lower extremity: Secondary | ICD-10-CM | POA: Diagnosis not present

## 2018-10-04 DIAGNOSIS — Z833 Family history of diabetes mellitus: Secondary | ICD-10-CM

## 2018-10-04 DIAGNOSIS — Z209 Contact with and (suspected) exposure to unspecified communicable disease: Secondary | ICD-10-CM | POA: Diagnosis not present

## 2018-10-04 DIAGNOSIS — Z8051 Family history of malignant neoplasm of kidney: Secondary | ICD-10-CM

## 2018-10-04 LAB — CBC WITH DIFFERENTIAL/PLATELET
Abs Immature Granulocytes: 0.1 10*3/uL — ABNORMAL HIGH (ref 0.00–0.07)
Basophils Absolute: 0.1 10*3/uL (ref 0.0–0.1)
Basophils Relative: 1 %
Eosinophils Absolute: 0.1 10*3/uL (ref 0.0–0.5)
Eosinophils Relative: 0 %
HCT: 36.2 % (ref 36.0–46.0)
Hemoglobin: 11.1 g/dL — ABNORMAL LOW (ref 12.0–15.0)
Immature Granulocytes: 1 %
Lymphocytes Relative: 10 %
Lymphs Abs: 1.5 10*3/uL (ref 0.7–4.0)
MCH: 26.4 pg (ref 26.0–34.0)
MCHC: 30.7 g/dL (ref 30.0–36.0)
MCV: 86 fL (ref 80.0–100.0)
Monocytes Absolute: 1.3 10*3/uL — ABNORMAL HIGH (ref 0.1–1.0)
Monocytes Relative: 9 %
Neutro Abs: 11.1 10*3/uL — ABNORMAL HIGH (ref 1.7–7.7)
Neutrophils Relative %: 79 %
Platelets: 446 10*3/uL — ABNORMAL HIGH (ref 150–400)
RBC: 4.21 MIL/uL (ref 3.87–5.11)
RDW: 14 % (ref 11.5–15.5)
WBC: 14.2 10*3/uL — ABNORMAL HIGH (ref 4.0–10.5)
nRBC: 0 % (ref 0.0–0.2)

## 2018-10-04 LAB — URINALYSIS, ROUTINE W REFLEX MICROSCOPIC
Bilirubin Urine: NEGATIVE
Glucose, UA: NEGATIVE mg/dL
Ketones, ur: 20 mg/dL — AB
Leukocytes,Ua: NEGATIVE
Nitrite: NEGATIVE
Protein, ur: NEGATIVE mg/dL
Specific Gravity, Urine: 1.016 (ref 1.005–1.030)
pH: 5 (ref 5.0–8.0)

## 2018-10-04 LAB — COMPREHENSIVE METABOLIC PANEL
ALT: 17 U/L (ref 0–44)
AST: 14 U/L — ABNORMAL LOW (ref 15–41)
Albumin: 2.3 g/dL — ABNORMAL LOW (ref 3.5–5.0)
Alkaline Phosphatase: 98 U/L (ref 38–126)
Anion gap: 11 (ref 5–15)
BUN: 12 mg/dL (ref 8–23)
CO2: 23 mmol/L (ref 22–32)
Calcium: 8.5 mg/dL — ABNORMAL LOW (ref 8.9–10.3)
Chloride: 92 mmol/L — ABNORMAL LOW (ref 98–111)
Creatinine, Ser: 1.03 mg/dL — ABNORMAL HIGH (ref 0.44–1.00)
GFR calc Af Amer: 59 mL/min — ABNORMAL LOW (ref >60)
GFR calc non Af Amer: 51 mL/min — ABNORMAL LOW (ref >60)
Glucose, Bld: 84 mg/dL (ref 70–99)
Potassium: 4 mmol/L (ref 3.5–5.1)
Sodium: 126 mmol/L — ABNORMAL LOW (ref 135–145)
Total Bilirubin: 0.6 mg/dL (ref 0.3–1.2)
Total Protein: 6.4 g/dL — ABNORMAL LOW (ref 6.5–8.1)

## 2018-10-04 LAB — SARS CORONAVIRUS 2 BY RT PCR (HOSPITAL ORDER, PERFORMED IN ~~LOC~~ HOSPITAL LAB): SARS Coronavirus 2: NEGATIVE

## 2018-10-04 LAB — BRAIN NATRIURETIC PEPTIDE: B Natriuretic Peptide: 103 pg/mL — ABNORMAL HIGH (ref 0.0–100.0)

## 2018-10-04 LAB — APTT: aPTT: 35 seconds (ref 24–36)

## 2018-10-04 LAB — HEPARIN LEVEL (UNFRACTIONATED): Heparin Unfractionated: <0.1 IU/mL — ABNORMAL LOW (ref 0.30–0.70)

## 2018-10-04 LAB — TROPONIN I (HIGH SENSITIVITY): Troponin I (High Sensitivity): 3 ng/L (ref <18)

## 2018-10-04 MED ORDER — LEVOFLOXACIN IN D5W 500 MG/100ML IV SOLN
500.0000 mg | Freq: Once | INTRAVENOUS | Status: AC
  Administered 2018-10-04: 500 mg via INTRAVENOUS
  Filled 2018-10-04: qty 100

## 2018-10-04 MED ORDER — HEPARIN BOLUS VIA INFUSION
3700.0000 [IU] | Freq: Once | INTRAVENOUS | Status: AC
  Administered 2018-10-05: 01:00:00 3700 [IU] via INTRAVENOUS

## 2018-10-04 MED ORDER — SODIUM CHLORIDE 0.9 % IV SOLN
INTRAVENOUS | Status: AC
  Administered 2018-10-05: 09:00:00 via INTRAVENOUS

## 2018-10-04 MED ORDER — IOHEXOL 350 MG/ML SOLN
100.0000 mL | Freq: Once | INTRAVENOUS | Status: AC | PRN
  Administered 2018-10-04: 100 mL via INTRAVENOUS

## 2018-10-04 MED ORDER — HEPARIN (PORCINE) 25000 UT/250ML-% IV SOLN
1750.0000 [IU]/h | INTRAVENOUS | Status: DC
  Administered 2018-10-05: 1200 [IU]/h via INTRAVENOUS
  Administered 2018-10-06 – 2018-10-07 (×2): 1600 [IU]/h via INTRAVENOUS
  Administered 2018-10-07 – 2018-10-08 (×3): 1750 [IU]/h via INTRAVENOUS
  Filled 2018-10-04 (×7): qty 250

## 2018-10-04 NOTE — H&P (Addendum)
History and Physical    Jamie Pitts NLZ:[id number] DOB: [birth year] DOA: 10/04/2018  PCP: Ria Bush, MD   Patient coming from: Home  I have personally briefly reviewed patient's old medical records in [place]  Chief Complaint: Weakness  HPI: Jamie Pitts is a 85 y.o. female with medical history significant for Parkinson's disease, hypothyroidism depression with psychosis and COPD, was brought to the ED with complaints of generalized weakness, increased confusion and falls over the past several months.  Patient tells me she has difficulty breathing, but denies chest pain. Patient tells me she has not been eating much over the past several days, due to poor appetite.  She denies vomiting or loose stools.  Patient tells me she feels weak all over. Patient lives with her husband.  Patient's daughter called EMS due to gradual decline over the past several months. I talked to patient's daughter, she reports that patient was discharged from the psych facility after a 2-week stay on 23 July.  She has chronic back pain and her chronic narcotics were recently discontinued at the psych facility as it was thought that her hydrocodone was causing auditory hallucinations. So patient has been lying in bed most of the day.  Daughter also noticed difficulty breathing over the past week at rest and significantly worse with minimal exertion.  Daughter reports multiple falls, at least 3 falls within 1 week.  Daughter reports 3-5 loose stools daily over the past 2 weeks.  ED Course: Tachycardic to 109, blood pressure systolic 1 teens to 902I, O2 sats 87% on room air, improved with nasal cannula to greater than 92%.  WBC 14.2.  Sodium 126.  BNP 103.  UA with rare bacteria, Levaquin given for probable UTI.  Portable chest x-ray negative for acute abnormality.  Subsequent CTA chest showed segmental and subsegmental pulmonary emboli, with positive CT evidence of borderline right heart strain consistent  with submassive PE.  Hospitalist was called to admit for PE.  Review of Systems: As per HPI all other systems reviewed and negative.  Past Medical History:  Diagnosis Date  . Adenomatous polyp    x1  . ALLERGIC RHINITIS 12/20/2006  . Chronic anxiety   . Chronic back pain 04/14/2007   02/2017 [facility] records reviewed - mild lumbar DDD and L SIJ pain s/p ESI latest 10/2016 with good effect  . Diverticulitis   . GERD (gastroesophageal reflux disease)    intermittent, treated with tums  . Hiatal hernia   . Hypothyroidism   . Insomnia   . Osteoporosis 2007   declined prolia  . Perforation of right tympanic membrane 09/03/2015   Chronic, seen by Dr Janace Hoard ENT   . Personal history of kidney stones    x1  . Postmenopausal atrophic vaginitis 2016   rec estrace cream - [place]  . Recurrent UTI   . Scoliosis   . Severe recurrent depression with psychosis ([place])   . Urinary retention   . Vitamin D deficiency     Past Surgical History:  Procedure Laterality Date  . ABDOMINAL HYSTERECTOMY  1979   ectopic pregnancy with one ovary removed  . APPENDECTOMY  1954  . CATARACT EXTRACTION, BILATERAL  11/2015  . CHOLECYSTECTOMY    . ESOPHAGOGASTRODUODENOSCOPY    . KNEE SURGERY Left 2012   L medial meniscal tear  . OOPHORECTOMY Right 1965   Ectopic pregnancy, removal of right ovary and tube- 1960  . ROTATOR CUFF REPAIR Right   . TONSILLECTOMY  reports that she has never smoked. She has never used smokeless tobacco. She reports that she does not drink alcohol or use drugs.  Allergies  Allergen Reactions  . Amoxicillin Hives    Sores in mouth  . Buprenorphine Hcl Itching    Can tolerate if necessary Can tolerate if necessary Can tolerate if necessary  . Morphine Itching    Can tolerate if necessary  . Sulfa Antibiotics Hives    Sores in mouth Sores in mouth   . Valium [Diazepam] Itching    Can tolerate if necessary    Family History  Problem Relation Age of Onset  . Anxiety  disorder Grandchild   . Depression Grandchild   . Cancer Sister        lung  . Healthy Sister   . Diabetes Mother   . Diabetes Father   . Schizophrenia Sister   . Diabetes Sister   . Diabetes Brother   . Cancer Sister        kidney  . Alcohol abuse Neg Hx   . Bipolar disorder Neg Hx   . Dementia Neg Hx   . Drug abuse Neg Hx   . OCD Neg Hx   . Paranoid behavior Neg Hx   . Seizures Neg Hx   . Sexual abuse Neg Hx   . Physical abuse Neg Hx     Prior to Admission medications   Medication Sig Start Date End Date Taking? Authorizing Provider  lamoTRIgine (LAMICTAL) 100 MG tablet Take 1 tablet (100 mg total) by mouth 2 (two) times daily. 08/13/18 08/13/19 Yes Cloria Spring, MD  levothyroxine (SYNTHROID) 75 MCG tablet 1 tablet before breakfast daily except half tablet on Sundays Patient taking differently: Take 75 mcg by mouth daily before breakfast.  06/18/18  Yes Elayne Snare, MD  loperamide (IMODIUM A-D) 2 MG tablet Take 1 tablet (2 mg total) by mouth 4 (four) times daily as needed for diarrhea or loose stools. 10/02/18 11/01/18 Yes Ria Bush, MD  naloxone [facility]) nasal spray 4 mg/0.1 mL Place 1 spray into the nose once.   Yes [provider]  risperiDONE (RISPERDAL) 3 MG tablet Take 0.5 tablets (1.5 mg total) by mouth 2 (two) times daily. 08/13/18  Yes Cloria Spring, MD  traMADol (ULTRAM) 50 MG tablet Take 1 tablet by mouth 2 (two) times a day. 08/11/18  Yes [provider]  traZODone (DESYREL) 150 MG tablet Take 1 tablet (150 mg total) by mouth at bedtime. 08/13/18  Yes Cloria Spring, MD  venlafaxine XR (EFFEXOR XR) 75 MG 24 hr capsule Take 1 capsule (75 mg total) by mouth daily. 08/13/18 08/13/19 Yes Cloria Spring, MD    Physical Exam: Vitals:   10/04/18 1930 10/04/18 1945 10/04/18 2000 10/04/18 2049  BP: 126/72  117/64 119/62  Pulse:  (!) 101 100 95  Resp: 17 16 18 17   Temp:      TempSrc:      SpO2:  98% 94% 92%  Weight:      Height:         Constitutional: NAD, calm, comfortable Vitals:   10/04/18 1930 10/04/18 1945 10/04/18 2000 10/04/18 2049  BP: 126/72  117/64 119/62  Pulse:  (!) 101 100 95  Resp: 17 16 18 17   Temp:      TempSrc:      SpO2:  98% 94% 92%  Weight:      Height:       Eyes: PERRL, lids and conjunctivae normal  ENMT: Mucous membranes are moist. Posterior pharynx clear of any exudate or lesions. Neck: normal, supple, no masses, no thyromegaly Respiratory: clear to auscultation bilaterally, no wheezing, no crackles. Normal respiratory effort. No accessory muscle use.  Cardiovascular: Mild Tachycardia, regular rate and rhythm, no murmurs / rubs / gallops. No extremity edema. 2+ pedal pulses.   Abdomen: no tenderness, no masses palpated. No hepatosplenomegaly. Bowel sounds positive.  Musculoskeletal: no clubbing / cyanosis. No joint deformity upper and lower extremities. Good ROM, no contractures. Normal muscle tone.  Skin: no rashes, lesions, ulcers. No induration Neurologic: CN 2-12 grossly intact.  Bilateral upper extremities 4+/5, lower extremities 4/5 Psychiatric: Normal judgment and insight. Alert and oriented x 3. Normal mood.   Labs on Admission: I have personally reviewed following labs and imaging studies  CBC: Recent Labs  Lab 09/28/18 1309 10/04/18 1540  WBC 13.1* 14.2*  NEUTROABS  --  11.1*  HGB 10.2* 11.1*  HCT 32.6* 36.2  MCV 86.9 86.0  PLT 455* 761*   Basic Metabolic Panel: Recent Labs  Lab 09/28/18 1309 10/04/18 1540  NA 127* 126*  K 4.2 4.0  CL 95* 92*  CO2 21* 23  GLUCOSE 107* 84  BUN 11 12  CREATININE 1.05* 1.03*  CALCIUM 8.5* 8.5*   Liver Function Tests: Recent Labs  Lab 10/04/18 1540  AST 14*  ALT 17  ALKPHOS 98  BILITOT 0.6  PROT 6.4*  ALBUMIN 2.3*   Cardiac Enzymes: Recent Labs  Lab 09/28/18 1309  CKTOTAL 271*   Urine analysis:    Component Value Date/Time   COLORURINE YELLOW 10/04/2018 [place] 10/04/2018 1626   LABSPEC 1.016  10/04/2018 1626   PHURINE 5.0 10/04/2018 1626   GLUCOSEU NEGATIVE 10/04/2018 1626   HGBUR SMALL (A) 10/04/2018 1626   BILIRUBINUR NEGATIVE 10/04/2018 1626   BILIRUBINUR negative 03/05/2017 1138   KETONESUR 20 (A) 10/04/2018 1626   PROTEINUR NEGATIVE 10/04/2018 1626   UROBILINOGEN 0.2 03/05/2017 1138   UROBILINOGEN 0.2 05/08/2014 0140   NITRITE NEGATIVE 10/04/2018 1626   LEUKOCYTESUR NEGATIVE 10/04/2018 1626    Radiological Exams on Admission: Ct Angio Chest Pe W And/or Wo Contrast  Result Date: 10/04/2018 CLINICAL DATA:  Dyspnea. EXAM: CT ANGIOGRAPHY CHEST WITH CONTRAST TECHNIQUE: Multidetector CT imaging of the chest was performed using the standard protocol during bolus administration of intravenous contrast. Multiplanar CT image reconstructions and MIPs were obtained to evaluate the vascular anatomy. CONTRAST:  177mL OMNIPAQUE IOHEXOL 350 MG/ML SOLN COMPARISON:  None. FINDINGS: Cardiovascular: Contrast injection is sufficient to demonstrate satisfactory opacification of the pulmonary arteries to the segmental level.The main pulmonary artery is dilated measuring 3.2 cm in diameter. The left pulmonary artery is dilated measuring 2.8 cm in diameter. There is an apparent stenosis or web involving the mid main right pulmonary artery (axial series 5, image 121). There is some poststenotic dilatation of the main right pulmonary artery. There are acute segmental and subsegmental bilateral pulmonary emboli. There is some significant narrowing of the right upper lobe pulmonary artery. The heart size is mildly enlarged. There is no significant pericardial effusion. There is borderline right heart strain with an RV/LV ratio measuring approximately 0.9 Mediastinum/Nodes: --there are multiple small calcified mediastinal and hilar lymph nodes bilaterally. --No axillary lymphadenopathy. --No supraclavicular lymphadenopathy. --Normal thyroid gland. --The esophagus is unremarkable Lungs/Pleura: There are scattered  areas of scarring and bronchiectasis involving the right upper lobe with associated pulmonary nodules measuring up to approximately 9 mm. There is atelectasis at the lung bases  bilaterally. There is no significant pleural effusion. There is no pneumothorax. The trachea is unremarkable. Upper Abdomen: There are multiple calcifications throughout the spleen likely related to a prior granulomatous infection. The spleen may be enlarged but is only partially visualized. There are few calcifications in the liver, again likely related to a prior granulomatous infection. Musculoskeletal: There is no acute displaced fracture or dislocation. There is a probable old healed fracture of the sternum. Review of the MIP images confirms the above findings. IMPRESSION: 1. Acute bilateral segmental and subsegmental pulmonary emboli as detailed above. Positive for acute PE with CT evidence of borderline right heart strain (RV/LV Ratio = 0.9) consistent with at least submassive (intermediate risk) PE. The presence of right heart strain has been associated with an increased risk of morbidity and mortality. 2. There is an apparent focal narrowing of the main right pulmonary artery as detailed above with a possible web at this location. There is some poststenotic dilatation of the right pulmonary artery beyond this web. This appearance may be secondary to a chronic pulmonary embolus versus an iatrogenic or traumatic injury. Follow-up is recommended. Consider outpatient cardiology workup and a subsequent PE study following treatment of the patient's acute pulmonary emboli to evaluate for resolution of this finding. This finding appears to contribute to significant dilatation of the main pulmonary artery and the main left pulmonary arteries. 3. Cluster of nodules in the right upper lobe with associated areas of bronchial wall thickening and bronchiectasis is favored to be post infectious in etiology or secondary to a chronic indolent  infection such as MAI. Follow-up is recommended to confirm resolution or stability of the pulmonary nodules, which currently measure up to approximately 9 mm. A three-month follow-up CT is recommended. 4. Multiple calcified mediastinal and hilar lymph nodes are noted in addition to calcifications throughout the liver and spleen. These findings are likely related to a prior granulomatous infection. These results were called by telephone at the time of interpretation on 10/04/2018 at 9:02 pm to Dr. Milton Ferguson , who verbally acknowledged these results. Electronically Signed   By: Constance Holster M.D.   On: 10/04/2018 21:06   Dg Chest Portable 1 View  Result Date: 10/04/2018 CLINICAL DATA:  Shortness of breath. EXAM: PORTABLE CHEST 1 VIEW COMPARISON:  Chest x-ray dated September 28, 2018. FINDINGS: The heart size and mediastinal contours are within normal limits. Normal pulmonary vascularity. No focal consolidation, pleural effusion, or pneumothorax. No acute osseous abnormality. IMPRESSION: No active disease. Electronically Signed   By: Titus Dubin M.D.   On: 10/04/2018 16:40    EKG: Independently reviewed.  QTc 437.  Sinus tachycardia rate 109, no significant ST or T wave changes compared to prior.  Assessment/Plan Active Problems:   PE (pulmonary thromboembolism) ([place])   Pulmonary embolism- patient inactive for at least 2 weeks.  Dyspnea with hypoxia O2 sats 87% on room air, currently on 4 L O2 sats 92- 98%, mild tachycardia to 109.  Appears unprovoked.  But patient has had gradual decline in functional status over the past several months.  BNP 103. -Talked to critical care at [facility], recommends checking troponin, at this time patient does not meet criteria for additional intervention apart from anticoagulation with heparin, admit to [facility] stepdown.  If patient decompensates reconsult critical care. -Heparin GTT, pharmacy to dose -Echocardiogram -Bilateral lower extremity  Dopplers(please reorder on arrival to [facility]) - N/s 100cc/hr x 15 hrs  Generalized weakness/failure to thrive- for the past several months, poor p.o.  intake, diarrhea, uncontrolled chronic pain.  UA with rare bacteria.  Per notes, primary care provider was trying to arrange hospice care for patient.   -PT evaluation, social work consult -Will benefit from placement -Follow-up urine cultures ordered in ED  Multiple falls-likely multifactorial from deconditioning, increased fragility, hypoxia, uncontrolled pain, poor p.o. intake and diarrhea. -Head CT - pelvic x-ray  Diarrhea-3-5 bowel movements daily over the past 2 weeks.  No vomiting, abdominal exam benign.  WBC 14.2. -Stool for C. Difficile -Hydrate  Hyponatremia-sodium 126.  Gradual downtrend from 131 over the past 5 months.  Likely from poor p.o. intake. - N/s 100cc/hr x 15 hrs - BMP a.m  Hypothyroidism-stable. -Resume home Synthroid  COPD-stable. -Duo nebs PRN.  Hx of Parkinsonism, depression with psychosis-stable.  Recent hospitalization at the psych facility near [facility] for 2 weeks. -Resume home risperidone, venlafaxine, trazodone.   - Daughter unaware patient is supposed to be taking lamotrigine, will hold.  Chronic pain-hydrocodone recently discontinued, as it was thought that it was causing auditory hallucinations.   DVT prophylaxis: Heparin gtt Code Status: DNR, per [name] and spouse.  Family Communication: Talked extensively to daughter on the phone.  All questions answered.  Disposition Plan: Per rounding team, may need placements. Consults called: None Admission status: Inpatient, stepdown I certify that at the point of admission it is my clinical judgment that the patient will require inpatient hospital care spanning beyond 2 midnights from the point of admission due to high intensity of service, high risk for further deterioration and high frequency of surveillance required. The following factors support  the patient status of inpatient: Submassive pulmonary embolism requiring close monitoring and hence stepdown level of care with IV heparin.  Bethena Roys MD [facility]  10/04/2018, 10:54 PM

## 2018-10-04 NOTE — Progress Notes (Signed)
Xcover CT brain ordered per tech by prior hospitalist , but they recommend delaying scan cause can't detect bleeding due to prior CTA with contrast.   Pt states that she has not fallen since April,  Pt states has rarely been out of bed.   MRI brain ordered STAT for AM for evaluation of generalized weakness

## 2018-10-04 NOTE — Progress Notes (Signed)
ANTICOAGULATION CONSULT NOTE - Initial Consult  Pharmacy Consult for heparin dosing Indication: pulmonary embolus  Allergies  Allergen Reactions  . Amoxicillin Hives    Sores in mouth  . Buprenorphine Hcl Itching    Can tolerate if necessary Can tolerate if necessary Can tolerate if necessary  . Morphine Itching    Can tolerate if necessary  . Sulfa Antibiotics Hives    Sores in mouth Sores in mouth   . Valium [Diazepam] Itching    Can tolerate if necessary    Patient Measurements: Height: 5\' 6"  (167.6 cm) Weight: 171 lb 1.2 oz (77.6 kg) IBW/kg (Calculated) : 59.3 Heparin Dosing Weight: HEPARIN DW (KG): 75.2  Vital Signs: Temp: 97.8 F (36.6 C) (08/16 1434) Temp Source: Oral (08/16 1434) BP: 119/62 (08/16 2049) Pulse Rate: 95 (08/16 2049)  Labs: Recent Labs    10/04/18 1540  HGB 11.1*  HCT 36.2  PLT 446*  CREATININE 1.03*    Estimated Creatinine Clearance: 44.3 mL/min (A) (by C-G formula based on SCr of 1.03 mg/dL (H)).   Medical History: Past Medical History:  Diagnosis Date  . Adenomatous polyp    x1  . ALLERGIC RHINITIS 12/20/2006  . Chronic anxiety   . Chronic back pain 04/14/2007   [facility]/2017 [facility] records reviewed - mild lumbar DDD and L SIJ pain s/p ESI latest 10/2016 with good effect  . Diverticulitis   . GERD (gastroesophageal reflux disease)    intermittent, treated with tums  . Hiatal hernia   . Hypothyroidism   . Insomnia   . Osteoporosis 2007   declined prolia  . Perforation of right tympanic membrane 09/03/2015   Chronic, seen by Dr Janace Hoard ENT   . Personal history of kidney stones    x1  . Postmenopausal atrophic vaginitis 2016   rec estrace cream - [place]  . Recurrent UTI   . Scoliosis   . Severe recurrent depression with psychosis ([place])   . Urinary retention   . Vitamin D deficiency      Assessment:  Pharmacy consulted to dose heparin for this 85 yo female for possible PE. Patient has been short of breath. She hasn't been on  an anti-coagulant prior to admission. Baseline CBC is WNL.  Goal of Therapy:  Heparin level 0.3-0.7 units/ml Monitor platelets by anticoagulation protocol: Yes   Plan:  Give 3700 units bolus x 1 Start heparin infusion at 1200 units/hr Check anti-Xa level in 6-8 hours and daily while on heparin Continue to monitor H&H and platelets  Despina Pole 10/04/2018,10:36 PM

## 2018-10-04 NOTE — ED Triage Notes (Signed)
Pt brought in by ems after her daughter called ems for difficulty taking care of pt. Pt typically lives with her husband and has been declining over the past several months with increased confusion and falls. Was set up with home health but dismissed them. Daughter thought it would be better to bring her home with her yesterday, but that is not working out so well as their home is not medically equipped. EMS states the daughter initially called them to take the pt back home, but when they said they could not she decided to bring her here.

## 2018-10-04 NOTE — Progress Notes (Signed)
ANTICOAGULATION CONSULT NOTE - Initial Consult  Pharmacy Consult for heparin dosing Indication: pulmonary embolus  Allergies  Allergen Reactions  . Amoxicillin Hives    Sores in mouth  . Buprenorphine Hcl Itching    Can tolerate if necessary Can tolerate if necessary Can tolerate if necessary  . Morphine Itching    Can tolerate if necessary  . Sulfa Antibiotics Hives    Sores in mouth Sores in mouth   . Valium [Diazepam] Itching    Can tolerate if necessary    Patient Measurements: Height: 5\' 6"  (167.6 cm) Weight: 171 lb 1.2 oz (77.6 kg) IBW/kg (Calculated) : 59.3 Heparin Dosing Weight: HEPARIN DW (KG): 75.2  Vital Signs: Temp: 97.8 F (36.6 C) (08/16 1434) Temp Source: Oral (08/16 1434) BP: 119/62 (08/16 2049) Pulse Rate: 95 (08/16 2049)  Labs: Recent Labs    10/04/18 1540  HGB 11.1*  HCT 36.2  PLT 446*  CREATININE 1.03*    Estimated Creatinine Clearance: 44.3 mL/min (A) (by C-G formula based on SCr of 1.03 mg/dL (H)).   Medical History: Past Medical History:  Diagnosis Date  . Adenomatous polyp    x1  . ALLERGIC RHINITIS 12/20/2006  . Chronic anxiety   . Chronic back pain 04/14/2007   02/2017 [facility] records reviewed - mild lumbar DDD and L SIJ pain s/p ESI latest 10/2016 with good effect  . Diverticulitis   . GERD (gastroesophageal reflux disease)    intermittent, treated with tums  . Hiatal hernia   . Hypothyroidism   . Insomnia   . Osteoporosis 2007   declined prolia  . Perforation of right tympanic membrane 09/03/2015   Chronic, seen by Dr Janace Hoard ENT   . Personal history of kidney stones    x1  . Postmenopausal atrophic vaginitis 2016   rec estrace cream - [place]  . Recurrent UTI   . Scoliosis   . Severe recurrent depression with psychosis ([place])   . Urinary retention   . Vitamin D deficiency      Assessment:  Pharmacy consulted to dose heparin for this 85 yo female for possible PE. Patient has been short of breath. She hasn't been on  an anti-coagulant prior to admission. Baseline CBC is WNL.  Goal of Therapy:  Heparin level 0.3-0.7 units/ml Monitor platelets by anticoagulation protocol: Yes   Plan:  Give 3700 units bolus x 1 Start heparin infusion at 1200 units/hr Check anti-Xa level in 6-8 hours and daily while on heparin Continue to monitor H&H and platelets  Despina Pole 10/04/2018,10:54 PM

## 2018-10-04 NOTE — ED Provider Notes (Signed)
[facility] EMERGENCY DEPARTMENT Provider Note   CSN: [id number] Arrival date & time: 10/04/18  1419     History   Chief Complaint Chief Complaint  Patient presents with   Failure To Thrive    HPI Jamie Pitts is a 85 y.o. female.     Patient was brought in because she is not eating or drinking for the last several days.  She is very weak.  The history is provided by a relative and medical records.  Shortness of Breath Severity:  Moderate Onset quality:  Sudden Timing:  Constant Progression:  Worsening Chronicity:  New Context: activity   Relieved by:  Nothing Worsened by:  Nothing Ineffective treatments:  None tried   Past Medical History:  Diagnosis Date   Adenomatous polyp    x1   ALLERGIC RHINITIS 12/20/2006   Chronic anxiety    Chronic back pain 04/14/2007   02/2017 - [facility] records reviewed - mild lumbar DDD and L SIJ pain s/p ESI latest 10/2016 with good effect   Diverticulitis    GERD (gastroesophageal reflux disease)    intermittent, treated with tums   Hiatal hernia    Hypothyroidism    Insomnia    Osteoporosis 2007   declined prolia   Perforation of right tympanic membrane 09/03/2015   Chronic, seen by Dr Janace Hoard ENT    Personal history of kidney stones    x1   Postmenopausal atrophic vaginitis 2016   rec estrace cream - [place]   Recurrent UTI    Scoliosis    Severe recurrent depression with psychosis ([place])    Urinary retention    Vitamin D deficiency     Patient Active Problem List   Diagnosis Date Noted   Vitamin B12 deficiency 09/07/2018   Vitamin D deficiency 09/07/2018   Bilateral leg pain 08/13/2018   Psychosis in elderly ([place]) 07/24/2018   Malnutrition of moderate degree 05/20/2017   Fecal impaction ([place]) 05/18/2017   Abdominal pain 05/18/2017   Long-term current use of opiate analgesic 03/06/2017   Chronic pain syndrome 03/06/2017   Nausea 03/04/2017   Abdominal cramping 05/24/2016   Perforation  of right tympanic membrane 09/03/2015   COPD (chronic obstructive pulmonary disease) ([place]) 12/07/2014   Constipation 05/08/2014   Urinary retention 05/08/2014   Urinary urgency 04/05/2014   Chronic cough 03/02/2013   Dysphagia 11/05/2012   Drug-induced Parkinsonism ([place]) 07/29/2012   Insomnia secondary to depression with anxiety 05/22/2012   Chronic low back pain 04/14/2007   Hypothyroidism 11/04/2006   GERD 11/04/2006   OSTEOPOROSIS 11/04/2006   Depression, major, recurrent, severe with psychosis ([place]) 08/29/2006    Past Surgical History:  Procedure Laterality Date   ABDOMINAL HYSTERECTOMY  1979   ectopic pregnancy with one ovary removed   [place], BILATERAL  11/2015   CHOLECYSTECTOMY     ESOPHAGOGASTRODUODENOSCOPY     KNEE SURGERY Left 2012   L medial meniscal tear   OOPHORECTOMY Right 1965   Ectopic pregnancy, removal of right ovary and tube- 1960   ROTATOR CUFF REPAIR Right    TONSILLECTOMY       OB History   No obstetric history on file.      Home Medications    Prior to Admission medications   Medication Sig Start Date End Date Taking? Authorizing Provider  lamoTRIgine (LAMICTAL) 100 MG tablet Take 1 tablet (100 mg total) by mouth 2 (two) times daily. 08/13/18 08/13/19 Yes Cloria Spring, MD  levothyroxine (SYNTHROID) 75  MCG tablet 1 tablet before breakfast daily except half tablet on Sundays Patient taking differently: Take 75 mcg by mouth daily before breakfast.  06/18/18  Yes Elayne Snare, MD  loperamide (IMODIUM A-D) 2 MG tablet Take 1 tablet (2 mg total) by mouth 4 (four) times daily as needed for diarrhea or loose stools. 10/02/18 11/01/18 Yes Ria [place], MD  naloxone [facility]) nasal spray 4 mg/0.1 mL Place 1 spray into the nose once.   Yes [provider]  risperiDONE (RISPERDAL) 3 MG tablet Take 0.5 tablets (1.5 mg total) by mouth 2 (two) times daily. 08/13/18  Yes Cloria Spring, MD    traMADol (ULTRAM) 50 MG tablet Take 1 tablet by mouth 2 (two) times a day. 08/11/18  Yes [provider]  traZODone (DESYREL) 150 MG tablet Take 1 tablet (150 mg total) by mouth at bedtime. 08/13/18  Yes Cloria Spring, MD  venlafaxine XR (EFFEXOR XR) 75 MG 24 hr capsule Take 1 capsule (75 mg total) by mouth daily. 08/13/18 08/13/19 Yes Cloria Spring, MD    Family History Family History  Problem Relation Age of Onset   Anxiety disorder Grandchild    Depression Grandchild    Cancer Sister        lung   Healthy Sister    Diabetes Mother    Diabetes Father    Schizophrenia Sister    Diabetes Sister    Diabetes Brother    Cancer Sister        kidney   Alcohol abuse Neg Hx    Bipolar disorder Neg Hx    Dementia Neg Hx    Drug abuse Neg Hx    OCD Neg Hx    Paranoid behavior Neg Hx    Seizures Neg Hx    Sexual abuse Neg Hx    Physical abuse Neg Hx     Social History Social History   Tobacco Use   Smoking status: Never Smoker   Smokeless tobacco: Never Used  Substance Use Topics   Alcohol use: No   Drug use: No     Allergies   Amoxicillin, Buprenorphine hcl, Morphine, Sulfa antibiotics, and Valium [diazepam]   Review of Systems Review of Systems  Unable to perform ROS: Dementia  Respiratory: Positive for shortness of breath.      Physical Exam Updated Vital Signs BP 119/62    Pulse 95    Temp 97.8 F (36.6 C) (Oral)    Resp 17    Ht 5\' 6"  (1.676 m)    Wt 77.6 kg    LMP 07/15/2012    SpO2 92%    BMI 27.61 kg/m   Physical Exam Vitals signs reviewed.  Constitutional:      Appearance: She is well-developed.  HENT:     Head: Normocephalic.     Nose: Nose normal.  Eyes:     General: No scleral icterus.    Conjunctiva/sclera: Conjunctivae normal.  Neck:     Musculoskeletal: Neck supple.     Thyroid: No thyromegaly.  Cardiovascular:     Rate and Rhythm: Normal rate and regular rhythm.     Heart sounds: No murmur. No friction  rub. No gallop.   Pulmonary:     Breath sounds: No stridor. No wheezing or rales.  Chest:     Chest wall: No tenderness.  Abdominal:     General: There is no distension.     Tenderness: There is no abdominal tenderness. There is no rebound.  Musculoskeletal: Normal  range of motion.  Lymphadenopathy:     Cervical: No cervical adenopathy.  Skin:    Findings: No erythema or rash.  Neurological:     Mental Status: She is alert.     Motor: No abnormal muscle tone.     Coordination: Coordination normal.     Comments: Oriented to person only  Psychiatric:        Behavior: Behavior normal.      ED Treatments / Results  Labs (all labs ordered are listed, but only abnormal results are displayed) Labs Reviewed  CBC WITH DIFFERENTIAL/PLATELET - Abnormal; Notable for the following components:      Result Value   WBC 14.2 (*)    Hemoglobin 11.1 (*)    Platelets 446 (*)    Neutro Abs 11.1 (*)    Monocytes Absolute 1.3 (*)    Abs Immature Granulocytes 0.10 (*)    All other components within normal limits  COMPREHENSIVE METABOLIC PANEL - Abnormal; Notable for the following components:   Sodium 126 (*)    Chloride 92 (*)    Creatinine, Ser 1.03 (*)    Calcium 8.5 (*)    Total Protein 6.4 (*)    Albumin 2.3 (*)    AST 14 (*)    GFR calc non Af Amer 51 (*)    GFR calc Af Amer 59 (*)    All other components within normal limits  BRAIN NATRIURETIC PEPTIDE - Abnormal; Notable for the following components:   B Natriuretic Peptide 103.0 (*)    All other components within normal limits  URINALYSIS, ROUTINE W REFLEX MICROSCOPIC - Abnormal; Notable for the following components:   Hgb urine dipstick SMALL (*)    Ketones, ur 20 (*)    Bacteria, UA RARE (*)    All other components within normal limits  SARS CORONAVIRUS 2 (HOSPITAL ORDER, [facility])  URINE CULTURE    EKG EKG Interpretation  Date/Time:  Sunday October 04 2018 14:30:16 EDT Ventricular Rate:    109 PR Interval:    QRS Duration: 91 QT Interval:  324 QTC Calculation: 437 R Axis:   62 Text Interpretation:  Sinus tachycardia Atrial premature complex Abnormal R-wave progression, early transition Borderline T abnormalities, anterior leads Confirmed by Milton Ferguson [phone]) on 10/04/2018 9:31:00 PM   Radiology Ct Angio Chest Pe W And/or Wo Contrast  Result Date: 10/04/2018 CLINICAL DATA:  Dyspnea. EXAM: CT ANGIOGRAPHY CHEST WITH CONTRAST TECHNIQUE: Multidetector CT imaging of the chest was performed using the standard protocol during bolus administration of intravenous contrast. Multiplanar CT image reconstructions and MIPs were obtained to evaluate the vascular anatomy. CONTRAST:  189mL OMNIPAQUE IOHEXOL 350 MG/ML SOLN COMPARISON:  None. FINDINGS: Cardiovascular: Contrast injection is sufficient to demonstrate satisfactory opacification of the pulmonary arteries to the segmental level.The main pulmonary artery is dilated measuring 3.2 cm in diameter. The left pulmonary artery is dilated measuring 2.8 cm in diameter. There is an apparent stenosis or web involving the mid main right pulmonary artery (axial series 5, image 121). There is some poststenotic dilatation of the main right pulmonary artery. There are acute segmental and subsegmental bilateral pulmonary emboli. There is some significant narrowing of the right upper lobe pulmonary artery. The heart size is mildly enlarged. There is no significant pericardial effusion. There is borderline right heart strain with an RV/LV ratio measuring approximately 0.9 Mediastinum/Nodes: --there are multiple small calcified mediastinal and hilar lymph nodes bilaterally. --No axillary lymphadenopathy. --No supraclavicular lymphadenopathy. --Normal thyroid gland. --  The esophagus is unremarkable Lungs/Pleura: There are scattered areas of scarring and bronchiectasis involving the right upper lobe with associated pulmonary nodules measuring up to approximately 9  mm. There is atelectasis at the lung bases bilaterally. There is no significant pleural effusion. There is no pneumothorax. The trachea is unremarkable. Upper Abdomen: There are multiple calcifications throughout the spleen likely related to a prior granulomatous infection. The spleen may be enlarged but is only partially visualized. There are few calcifications in the liver, again likely related to a prior granulomatous infection. Musculoskeletal: There is no acute displaced fracture or dislocation. There is a probable old healed fracture of the sternum. Review of the MIP images confirms the above findings. IMPRESSION: 1. Acute bilateral segmental and subsegmental pulmonary emboli as detailed above. Positive for acute PE with CT evidence of borderline right heart strain (RV/LV Ratio = 0.9) consistent with at least submassive (intermediate risk) PE. The presence of right heart strain has been associated with an increased risk of morbidity and mortality. 2. There is an apparent focal narrowing of the main right pulmonary artery as detailed above with a possible web at this location. There is some poststenotic dilatation of the right pulmonary artery beyond this web. This appearance may be secondary to a chronic pulmonary embolus versus an iatrogenic or traumatic injury. Follow-up is recommended. Consider outpatient cardiology workup and a subsequent PE study following treatment of the patient's acute pulmonary emboli to evaluate for resolution of this finding. This finding appears to contribute to significant dilatation of the main pulmonary artery and the main left pulmonary arteries. 3. Cluster of nodules in the right upper lobe with associated areas of bronchial wall thickening and bronchiectasis is favored to be post infectious in etiology or secondary to a chronic indolent infection such as MAI. Follow-up is recommended to confirm resolution or stability of the pulmonary nodules, which currently measure up to  approximately 9 mm. A three-month follow-up CT is recommended. 4. Multiple calcified mediastinal and hilar lymph nodes are noted in addition to calcifications throughout the liver and spleen. These findings are likely related to a prior granulomatous infection. These results were called by telephone at the time of interpretation on 10/04/2018 at 9:02 pm to Dr. Milton Ferguson , who verbally acknowledged these results. Electronically Signed   By: Constance Holster M.D.   On: 10/04/2018 21:06   Dg Chest Portable 1 View  Result Date: 10/04/2018 CLINICAL DATA:  Shortness of breath. EXAM: PORTABLE CHEST 1 VIEW COMPARISON:  Chest x-ray dated September 28, 2018. FINDINGS: The heart size and mediastinal contours are within normal limits. Normal pulmonary vascularity. No focal consolidation, pleural effusion, or pneumothorax. No acute osseous abnormality. IMPRESSION: No active disease. Electronically Signed   By: Titus Dubin M.D.   On: 10/04/2018 16:40    Procedures Procedures (including critical care time)  Medications Ordered in ED Medications  levofloxacin (LEVAQUIN) IVPB 500 mg (0 mg Intravenous Stopped 10/04/18 2050)  iohexol (OMNIPAQUE) 350 MG/ML injection 100 mL (100 mLs Intravenous Contrast Given 10/04/18 2022)     Initial Impression / Assessment and Plan / ED Course  I have reviewed the triage vital signs and the nursing notes.  Pertinent labs & imaging results that were available during my care of the patient were reviewed by me and considered in my medical decision making (see chart for details). CRITICAL CARE Performed by: Milton Ferguson Total critical care time: 40 minutes Critical care time was exclusive of separately billable procedures and treating other patients. Critical care  was necessary to treat or prevent imminent or life-threatening deterioration. Critical care was time spent personally by me on the following activities: development of treatment plan with patient and/or surrogate  as well as nursing, discussions with consultants, evaluation of patient's response to treatment, examination of patient, obtaining history from patient or surrogate, ordering and performing treatments and interventions, ordering and review of laboratory studies, ordering and review of radiographic studies, pulse oximetry and re-evaluation of patient's condition.    CT scan shows bilateral pulmonary emboli.  Patient is mildly hypoxic.  She will be admitted to medicine      Final Clinical Impressions(s) / ED Diagnoses   Final diagnoses:  Multiple pulmonary emboli [facility])    ED Discharge Orders    None       Milton Ferguson, MD 10/04/18 2156

## 2018-10-05 ENCOUNTER — Telehealth: Payer: Self-pay | Admitting: *Deleted

## 2018-10-05 ENCOUNTER — Inpatient Hospital Stay (HOSPITAL_COMMUNITY): Payer: Medicare Other

## 2018-10-05 ENCOUNTER — Other Ambulatory Visit (HOSPITAL_COMMUNITY): Payer: Self-pay | Admitting: *Deleted

## 2018-10-05 DIAGNOSIS — R0609 Other forms of dyspnea: Secondary | ICD-10-CM

## 2018-10-05 LAB — TSH: TSH: 22.127 u[IU]/mL — ABNORMAL HIGH (ref 0.350–4.500)

## 2018-10-05 LAB — BASIC METABOLIC PANEL
Anion gap: 9 (ref 5–15)
BUN: 14 mg/dL (ref 8–23)
CO2: 24 mmol/L (ref 22–32)
Calcium: 8.5 mg/dL — ABNORMAL LOW (ref 8.9–10.3)
Chloride: 96 mmol/L — ABNORMAL LOW (ref 98–111)
Creatinine, Ser: 1 mg/dL (ref 0.44–1.00)
GFR calc Af Amer: >60 mL/min (ref >60)
GFR calc non Af Amer: 52 mL/min — ABNORMAL LOW (ref >60)
Glucose, Bld: 90 mg/dL (ref 70–99)
Potassium: 4.3 mmol/L (ref 3.5–5.1)
Sodium: 129 mmol/L — ABNORMAL LOW (ref 135–145)

## 2018-10-05 LAB — CBC
HCT: 33.7 % — ABNORMAL LOW (ref 36.0–46.0)
Hemoglobin: 10.3 g/dL — ABNORMAL LOW (ref 12.0–15.0)
MCH: 26.7 pg (ref 26.0–34.0)
MCHC: 30.6 g/dL (ref 30.0–36.0)
MCV: 87.3 fL (ref 80.0–100.0)
Platelets: 392 10*3/uL (ref 150–400)
RBC: 3.86 MIL/uL — ABNORMAL LOW (ref 3.87–5.11)
RDW: 14 % (ref 11.5–15.5)
WBC: 13 10*3/uL — ABNORMAL HIGH (ref 4.0–10.5)
nRBC: 0 % (ref 0.0–0.2)

## 2018-10-05 LAB — TROPONIN I (HIGH SENSITIVITY): Troponin I (High Sensitivity): 3 ng/L (ref <18)

## 2018-10-05 LAB — ECHOCARDIOGRAM COMPLETE
Height: 66 in
Weight: 2705.49 oz

## 2018-10-05 LAB — HEPARIN LEVEL (UNFRACTIONATED)
Heparin Unfractionated: <0.1 IU/mL — ABNORMAL LOW (ref 0.30–0.70)
Heparin Unfractionated: 0.23 IU/mL — ABNORMAL LOW (ref 0.30–0.70)

## 2018-10-05 MED ORDER — RISPERIDONE 1 MG PO TABS
1.5000 mg | ORAL_TABLET | Freq: Two times a day (BID) | ORAL | Status: DC
  Administered 2018-10-05 – 2018-10-10 (×12): 1.5 mg via ORAL
  Filled 2018-10-05 (×4): qty 1
  Filled 2018-10-05: qty 2
  Filled 2018-10-05 (×8): qty 1

## 2018-10-05 MED ORDER — LEVOTHYROXINE SODIUM 75 MCG PO TABS
75.0000 ug | ORAL_TABLET | Freq: Every day | ORAL | Status: DC
  Administered 2018-10-05 – 2018-10-07 (×3): 75 ug via ORAL
  Filled 2018-10-05 (×2): qty 1
  Filled 2018-10-05: qty 2

## 2018-10-05 MED ORDER — HEPARIN BOLUS VIA INFUSION
2000.0000 [IU] | Freq: Once | INTRAVENOUS | Status: AC
  Administered 2018-10-05: 2000 [IU] via INTRAVENOUS

## 2018-10-05 MED ORDER — ONDANSETRON HCL 4 MG/2ML IJ SOLN
4.0000 mg | Freq: Four times a day (QID) | INTRAMUSCULAR | Status: DC | PRN
  Administered 2018-10-05 – 2018-10-06 (×2): 4 mg via INTRAVENOUS
  Filled 2018-10-05 (×2): qty 2

## 2018-10-05 MED ORDER — VENLAFAXINE HCL ER 75 MG PO CP24
75.0000 mg | ORAL_CAPSULE | Freq: Every day | ORAL | Status: DC
  Administered 2018-10-05 – 2018-10-10 (×6): 75 mg via ORAL
  Filled 2018-10-05: qty 1
  Filled 2018-10-05: qty 2
  Filled 2018-10-05 (×4): qty 1

## 2018-10-05 MED ORDER — TRAZODONE HCL 50 MG PO TABS
150.0000 mg | ORAL_TABLET | Freq: Every day | ORAL | Status: DC
  Administered 2018-10-05 – 2018-10-10 (×6): 150 mg via ORAL
  Filled 2018-10-05 (×6): qty 3

## 2018-10-05 MED ORDER — ONDANSETRON HCL 4 MG PO TABS
4.0000 mg | ORAL_TABLET | Freq: Four times a day (QID) | ORAL | Status: DC | PRN
  Administered 2018-10-09: 4 mg via ORAL
  Filled 2018-10-05: qty 1

## 2018-10-05 MED ORDER — TRAMADOL HCL 50 MG PO TABS
50.0000 mg | ORAL_TABLET | Freq: Two times a day (BID) | ORAL | Status: DC | PRN
  Administered 2018-10-05 – 2018-10-07 (×2): 50 mg via ORAL
  Filled 2018-10-05 (×2): qty 1

## 2018-10-05 MED ORDER — HEPARIN BOLUS VIA INFUSION
1100.0000 [IU] | Freq: Once | INTRAVENOUS | Status: AC
  Administered 2018-10-05: 18:00:00 1100 [IU] via INTRAVENOUS
  Filled 2018-10-05: qty 1100

## 2018-10-05 NOTE — Progress Notes (Signed)
*  PRELIMINARY RESULTS* Echocardiogram 2D Echocardiogram has been performed.  Jamie Pitts 10/05/2018, 3:55 PM

## 2018-10-05 NOTE — Progress Notes (Signed)
Palliative Medicine Team  Due to high volume of referrals, there is a delay seeing this patient. PMT will arrange [place] with patient/family tomorrow, 8/18. Thank you.   NO CHARGE  Ihor Dow, [place], FNP-C Palliative Medicine Team  Phone: [phone] Fax: [phone]

## 2018-10-05 NOTE — Progress Notes (Signed)
ANTICOAGULATION CONSULT NOTE -  Pharmacy Consult for heparin dosing Indication: pulmonary embolus  Allergies  Allergen Reactions  . Amoxicillin Hives    Sores in mouth  . Buprenorphine Hcl Itching    Can tolerate if necessary Can tolerate if necessary Can tolerate if necessary  . Morphine Itching    Can tolerate if necessary  . Sulfa Antibiotics Hives    Sores in mouth Sores in mouth   . Valium [Diazepam] Itching    Can tolerate if necessary    Patient Measurements: Height: 5\' 6"  (167.6 cm) Weight: 169 lb 1.5 oz (76.7 kg) IBW/kg (Calculated) : 59.3 Heparin Dosing Weight: HEPARIN DW (KG): 74.9  Vital Signs: Temp: 98.3 F (36.8 C) (08/17 1612) Temp Source: Oral (08/17 1612) BP: 125/65 (08/17 1600) Pulse Rate: 105 (08/17 1612)  Labs: Recent Labs    10/04/18 1540 10/04/18 2214 10/04/18 2245 10/05/18 0024 10/05/18 0531 10/05/18 0532 10/05/18 1644  HGB 11.1*  --   --   --  10.3*  --   --   HCT 36.2  --   --   --  33.7*  --   --   PLT 446*  --   --   --  392  --   --   APTT  --   --  35  --   --   --   --   HEPARINUNFRC  --   --  <0.10*  --   --  <0.10* 0.23*  CREATININE 1.03*  --   --   --  1.00  --   --   TROPONINIHS  --  3  --  3  --   --   --     Estimated Creatinine Clearance: 45.4 mL/min (by C-G formula based on SCr of 1 mg/dL).   Medical History: Past Medical History:  Diagnosis Date  . Adenomatous polyp    x1  . ALLERGIC RHINITIS 12/20/2006  . Chronic anxiety   . Chronic back pain 04/14/2007   02/2017 [facility] records reviewed - mild lumbar DDD and L SIJ pain s/p ESI latest 10/2016 with good effect  . Diverticulitis   . GERD (gastroesophageal reflux disease)    intermittent, treated with tums  . Hiatal hernia   . Hypothyroidism   . Insomnia   . Osteoporosis 2007   declined prolia  . Perforation of right tympanic membrane 09/03/2015   Chronic, seen by Dr Janace Hoard ENT   . Personal history of kidney stones    x1  . Postmenopausal atrophic vaginitis  2016   rec estrace cream - [place]  . Recurrent UTI   . Scoliosis   . Severe recurrent depression with psychosis ([place])   . Urinary retention   . Vitamin D deficiency      Assessment:  Pharmacy consulted to dose heparin for this 85 yo female for possible PE. Patient has been short of breath. She hasn't been on an anti-coagulant prior to admission. Baseline CBC is WNL.  HL 0.23  Goal of Therapy:  Heparin level 0.3-0.7 units/ml Monitor platelets by anticoagulation protocol: Yes   Plan:  Rebolus 1100 units x 1 Increase heparin infusion to 1600 units/hr. Check anti-Xa level in 6-8 hours and daily while on heparin. Continue to monitor H&H and platelets.  Margot Ables, PharmD Clinical Pharmacist 10/05/2018 5:25 PM

## 2018-10-05 NOTE — Telephone Encounter (Addendum)
Jamie Pitts with [facility] left a voicemail stating that the nurse went out to do an evaluation on Thursday 10/01/18 and family declined services. Jamie Pitts stated since that date they have been back in touch with family and they have agreed to services now.Jamie Pitts stated that patient went to [facility] and was transported to [facility]. Jamie Pitts stated that they will be in touch with family to do an evaluation when patient is discharged from the hospital.

## 2018-10-05 NOTE — Progress Notes (Signed)
ANTICOAGULATION CONSULT NOTE -  Pharmacy Consult for heparin dosing Indication: pulmonary embolus  Allergies  Allergen Reactions  . Amoxicillin Hives    Sores in mouth  . Buprenorphine Hcl Itching    Can tolerate if necessary Can tolerate if necessary Can tolerate if necessary  . Morphine Itching    Can tolerate if necessary  . Sulfa Antibiotics Hives    Sores in mouth Sores in mouth   . Valium [Diazepam] Itching    Can tolerate if necessary    Patient Measurements: Height: 5\' 6"  (167.6 cm) Weight: 171 lb 1.2 oz (77.6 kg) IBW/kg (Calculated) : 59.3 Heparin Dosing Weight: HEPARIN DW (KG): 75.2  Vital Signs: BP: 118/66 (08/17 0730) Pulse Rate: 97 (08/17 0730)  Labs: Recent Labs    10/04/18 1540 10/04/18 2214 10/04/18 2245 10/05/18 0024 10/05/18 0531 10/05/18 0532  HGB 11.1*  --   --   --  10.3*  --   HCT 36.2  --   --   --  33.7*  --   PLT 446*  --   --   --  392  --   APTT  --   --  35  --   --   --   HEPARINUNFRC  --   --  <0.10*  --   --  <0.10*  CREATININE 1.03*  --   --   --  1.00  --   TROPONINIHS  --  3  --  3  --   --     Estimated Creatinine Clearance: 45.6 mL/min (by C-G formula based on SCr of 1 mg/dL).   Medical History: Past Medical History:  Diagnosis Date  . Adenomatous polyp    x1  . ALLERGIC RHINITIS 12/20/2006  . Chronic anxiety   . Chronic back pain 04/14/2007   02/2017 [facility] records reviewed - mild lumbar DDD and L SIJ pain s/p ESI latest 10/2016 with good effect  . Diverticulitis   . GERD (gastroesophageal reflux disease)    intermittent, treated with tums  . Hiatal hernia   . Hypothyroidism   . Insomnia   . Osteoporosis 2007   declined prolia  . Perforation of right tympanic membrane 09/03/2015   Chronic, seen by Dr Janace Hoard ENT   . Personal history of kidney stones    x1  . Postmenopausal atrophic vaginitis 2016   rec estrace cream - [place]  . Recurrent UTI   . Scoliosis   . Severe recurrent depression with psychosis ([place])    . Urinary retention   . Vitamin D deficiency      Assessment:  Pharmacy consulted to dose heparin for this 85 yo female for possible PE. Patient has been short of breath. She hasn't been on an anti-coagulant prior to admission. Baseline CBC is WNL.  HL <0.10  Goal of Therapy:  Heparin level 0.3-0.7 units/ml Monitor platelets by anticoagulation protocol: Yes   Plan:  Rebolus 2000 units x 1 Increase heparin infusion to 1450 units/hr. Check anti-Xa level in 6-8 hours and daily while on heparin. Continue to monitor H&H and platelets.  Margot Ables, PharmD Clinical Pharmacist 10/05/2018 8:42 AM

## 2018-10-05 NOTE — Progress Notes (Signed)
PT Cancellation Note  Patient Details Name: Jamie Pitts MRN: [id number] DOB: 1933-04-13   Cancelled Treatment:    Reason Eval/Treat Not Completed: Medical issues which prohibited therapy.  Physical therapy held secondary to INR not in therapeutic range - RN aware.  Will check back tomorrow.   3:33 PM, 10/05/18 Lonell Grandchild, MPT Physical Therapist with [facility] [phone] office [phone] mobile phone

## 2018-10-05 NOTE — Progress Notes (Signed)
PROGRESS NOTE    Jamie Pitts  RXV:[id number] DOB: 07/21/1933 DOA: 10/04/2018 PCP: Ria Bush, MD   Brief Narrative:  Per HPI: Jamie Pitts is a 85 y.o. female with medical history significant for Parkinson's disease, hypothyroidism depression with psychosis and COPD, was brought to the ED with complaints of generalized weakness, increased confusion and falls over the past several months.  Patient tells me she has difficulty breathing, but denies chest pain. Patient tells me she has not been eating much over the past several days, due to poor appetite.  She denies vomiting or loose stools.  Patient tells me she feels weak all over. Patient lives with her husband.  Patient's daughter called EMS due to gradual decline over the past several months. I talked to patient's daughter, she reports that patient was discharged from the psych facility after a 2-week stay on 23 July.  She has chronic back pain and her chronic narcotics were recently discontinued at the psych facility as it was thought that her hydrocodone was causing auditory hallucinations. So patient has been lying in bed most of the day.  Daughter also noticed difficulty breathing over the past week at rest and significantly worse with minimal exertion.  Daughter reports multiple falls, at least 3 falls within 1 week.  Daughter reports 3-5 loose stools daily over the past 2 weeks.  Patient was admitted with bilateral submassive pulmonary embolism with demonstrated RV strain on CT scan.  2D echocardiogram currently pending.  Bilateral lower extremity ultrasound with left-sided occlusive DVT noted and no DVT to the right lower extremity.  Brain MRI performed for generalized weakness with no acute findings.  PT evaluation and social worker consultation pending.  May end up needing hospice as well.   Assessment & Plan:   Active Problems:   PE (pulmonary thromboembolism) (HCC)   Acute hypoxemic respiratory failure secondary to  submassive bilateral PE with left occlusive DVT -This is all likely secondary to recent immobility with gradual decline in functional status over several months -Okay for management in stepdown unit on heparin drip with stable vital signs noted; will need to remain on heparin drip for total 5 days, at which point patient could be transitioned to Eliquis per recommendations from Dr. Luan Pulling -2D echocardiogram ordered and pending -We will check Doppler pulses of left and right lower extremities routinely  Generalized weakness/failure to thrive over several months -Appreciate PT evaluation and social worker consultation -Consultation to palliative care for further evaluation as well -Will benefit from placement  Multiple falls- multifactorial with fragility and deconditioning -No evidence of trauma or pelvic fracture noted -Fall precautions and PT evaluation  Diarrhea -Stool C. difficile already ordered and sent -Continue hydration -We will monitor and check GI panel  Hyponatremia-likely secondary to poor p.o. intake -Continue normal saline and trend with repeat BMP in a.m.  Hypothyroidism -Continue home Synthroid and check TSH  COPD-stable -Duo nebs as needed for wheezing  History of parkinsonism/depression with psychosis -Continue home risperidone, venlafaxine and trazodone -Holding lamotrigine as it does not appear patient has been taking this at home  Chronic pain -Hydrocodone recently discontinued as it appeared to be causing her auditory hallucinations  DVT prophylaxis: Heparin drip Code Status: DNR Family Communication: We will discuss with [name] Disposition Plan: Continue treatment with IV heparin drip.  Appreciate palliative care consultation.  Check 2D echocardiogram.   Consultants:   Palliative Care  Procedures:   None  Antimicrobials:   None   Subjective: Patient seen and evaluated today  with no new acute complaints or concerns. No acute  concerns or events noted overnight.  She is currently on 2 L nasal cannula and does not appear to be in any respiratory distress.  Objective: Vitals:   10/05/18 0900 10/05/18 1030 10/05/18 1100 10/05/18 1123  BP: 129/73 123/71 119/69   Pulse: 100 (!) 105 (!) 103   Resp:  13 11   Temp:      TempSrc:      SpO2: 99% 97% 95% 96%  Weight:    76.7 kg  Height:    5\' 6"  (1.676 m)    Intake/Output Summary (Last 24 hours) at 10/05/2018 1138 Last data filed at 10/04/2018 1625 Gross per 24 hour  Intake --  Output 8 ml  Net -8 ml   Filed Weights   10/04/18 1435 10/05/18 1123  Weight: 77.6 kg 76.7 kg    Examination:  General exam: Appears calm and comfortable  Respiratory system: Clear to auscultation. Respiratory effort normal.  Currently on 2 L nasal cannula. Cardiovascular system: S1 & S2 heard, RRR. No JVD, murmurs, rubs, gallops or clicks. No pedal edema. Gastrointestinal system: Abdomen is nondistended, soft and nontender. No organomegaly or masses felt. Normal bowel sounds heard. Central nervous system: Alert and awake Extremities: Symmetric 5 x 5 power. Skin: No rashes, lesions or ulcers Psychiatry: Flat affect    Data Reviewed: I have personally reviewed following labs and imaging studies  CBC: Recent Labs  Lab 09/28/18 1309 10/04/18 1540 10/05/18 0531  WBC 13.1* 14.2* 13.0*  NEUTROABS  --  11.1*  --   HGB 10.2* 11.1* 10.3*  HCT 32.6* 36.2 33.7*  MCV 86.9 86.0 87.3  PLT 455* 446* 378   Basic Metabolic Panel: Recent Labs  Lab 09/28/18 1309 10/04/18 1540 10/05/18 0531  NA 127* 126* 129*  K 4.2 4.0 4.3  CL 95* 92* 96*  CO2 21* 23 24  GLUCOSE 107* 84 90  BUN 11 12 14   CREATININE 1.05* 1.03* 1.00  CALCIUM 8.5* 8.5* 8.5*   GFR: Estimated Creatinine Clearance: 45.4 mL/min (by C-G formula based on SCr of 1 mg/dL). Liver Function Tests: Recent Labs  Lab 10/04/18 1540  AST 14*  ALT 17  ALKPHOS 98  BILITOT 0.6  PROT 6.4*  ALBUMIN 2.3*   No results for  input(s): LIPASE, AMYLASE in the last 168 hours. No results for input(s): AMMONIA in the last 168 hours. Coagulation Profile: No results for input(s): INR, PROTIME in the last 168 hours. Cardiac Enzymes: Recent Labs  Lab 09/28/18 1309  CKTOTAL 271*   BNP (last 3 results) No results for input(s): PROBNP in the last 8760 hours. HbA1C: No results for input(s): HGBA1C in the last 72 hours. CBG: No results for input(s): GLUCAP in the last 168 hours. Lipid Profile: No results for input(s): CHOL, HDL, LDLCALC, TRIG, CHOLHDL, LDLDIRECT in the last 72 hours. Thyroid Function Tests: No results for input(s): TSH, T4TOTAL, FREET4, T3FREE, THYROIDAB in the last 72 hours. Anemia Panel: No results for input(s): VITAMINB12, FOLATE, FERRITIN, TIBC, IRON, RETICCTPCT in the last 72 hours. Sepsis Labs: No results for input(s): PROCALCITON, LATICACIDVEN in the last 168 hours.  Recent Results (from the past 240 hour(s))  SARS Coronavirus 2 [facility] order, Performed in [facility] hospital lab) Nasopharyngeal Nasopharyngeal Swab     Status: None   Collection Time: 10/04/18  4:26 PM   Specimen: Nasopharyngeal Swab  Result Value Ref Range Status   SARS Coronavirus 2 NEGATIVE NEGATIVE Final    Comment: (  NOTE) If result is NEGATIVE SARS-CoV-2 target nucleic acids are NOT DETECTED. The SARS-CoV-2 RNA is generally detectable in upper and lower  respiratory specimens during the acute phase of infection. The lowest  concentration of SARS-CoV-2 viral copies this assay can detect is 250  copies / mL. A negative result does not preclude SARS-CoV-2 infection  and should not be used as the sole basis for treatment or other  patient management decisions.  A negative result may occur with  improper specimen collection / handling, submission of specimen other  than nasopharyngeal swab, presence of viral mutation(s) within the  areas targeted by this assay, and inadequate number of viral copies  (<250 copies /  mL). A negative result must be combined with clinical  observations, patient history, and epidemiological information. If result is POSITIVE SARS-CoV-2 target nucleic acids are DETECTED. The SARS-CoV-2 RNA is generally detectable in upper and lower  respiratory specimens dur ing the acute phase of infection.  Positive  results are indicative of active infection with SARS-CoV-2.  Clinical  correlation with patient history and other diagnostic information is  necessary to determine patient infection status.  Positive results do  not rule out bacterial infection or co-infection with other viruses. If result is PRESUMPTIVE POSTIVE SARS-CoV-2 nucleic acids MAY BE PRESENT.   A presumptive positive result was obtained on the submitted specimen  and confirmed on repeat testing.  While 2019 novel coronavirus  (SARS-CoV-2) nucleic acids may be present in the submitted sample  additional confirmatory testing may be necessary for epidemiological  and / or clinical management purposes  to differentiate between  SARS-CoV-2 and other Sarbecovirus currently known to infect humans.  If clinically indicated additional testing with an alternate test  methodology [phone]) is advised. The SARS-CoV-2 RNA is generally  detectable in upper and lower respiratory sp ecimens during the acute  phase of infection. The expected result is Negative. Fact Sheet for Patients:  StrictlyIdeas.no Fact Sheet for Healthcare Providers: BankingDealers.co.za This test is not yet approved or cleared by the [place] FDA and has been authorized for detection and/or diagnosis of SARS-CoV-2 by FDA under an Emergency Use Authorization (EUA).  This EUA will remain in effect (meaning this test can be used) for the duration of the COVID-19 declaration under Section 564(b)(1) of the Act, 21 U.S.C. section 360bbb-3(b)(1), unless the authorization is terminated or revoked  sooner. Performed at [facility], [address] [place], [place] [address]          Radiology Studies: Dg Pelvis 1-2 Views  Result Date: 10/04/2018 CLINICAL DATA:  Multiple falls. EXAM: PELVIS - 1-2 VIEW COMPARISON:  Pelvic radiographs 6 days ago 09/28/2018 FINDINGS: Portions of bilateral superior pubic rami are obscured by dense excreted IV contrast in the urinary bladder from chest CT earlier this day. The cortical margins of the bony pelvis are intact. No fracture. Pubic symphysis and sacroiliac joints are congruent. Both femoral heads are well-seated in the respective acetabula. IMPRESSION: No evidence of pelvic fracture. Electronically Signed   By: Keith Rake M.D.   On: 10/04/2018 23:36   Ct Angio Chest Pe W And/or Wo Contrast  Result Date: 10/04/2018 CLINICAL DATA:  Dyspnea. EXAM: CT ANGIOGRAPHY CHEST WITH CONTRAST TECHNIQUE: Multidetector CT imaging of the chest was performed using the standard protocol during bolus administration of intravenous contrast. Multiplanar CT image reconstructions and MIPs were obtained to evaluate the vascular anatomy. CONTRAST:  166mL OMNIPAQUE IOHEXOL 350 MG/ML SOLN COMPARISON:  None. FINDINGS: Cardiovascular: Contrast injection is sufficient  to demonstrate satisfactory opacification of the pulmonary arteries to the segmental level.The main pulmonary artery is dilated measuring 3.2 cm in diameter. The left pulmonary artery is dilated measuring 2.8 cm in diameter. There is an apparent stenosis or web involving the mid main right pulmonary artery (axial series 5, image 121). There is some poststenotic dilatation of the main right pulmonary artery. There are acute segmental and subsegmental bilateral pulmonary emboli. There is some significant narrowing of the right upper lobe pulmonary artery. The heart size is mildly enlarged. There is no significant pericardial effusion. There is borderline right heart strain with an RV/LV ratio measuring approximately  0.9 Mediastinum/Nodes: --there are multiple small calcified mediastinal and hilar lymph nodes bilaterally. --No axillary lymphadenopathy. --No supraclavicular lymphadenopathy. --Normal thyroid gland. --The esophagus is unremarkable Lungs/Pleura: There are scattered areas of scarring and bronchiectasis involving the right upper lobe with associated pulmonary nodules measuring up to approximately 9 mm. There is atelectasis at the lung bases bilaterally. There is no significant pleural effusion. There is no pneumothorax. The trachea is unremarkable. Upper Abdomen: There are multiple calcifications throughout the spleen likely related to a prior granulomatous infection. The spleen may be enlarged but is only partially visualized. There are few calcifications in the liver, again likely related to a prior granulomatous infection. Musculoskeletal: There is no acute displaced fracture or dislocation. There is a probable old healed fracture of the sternum. Review of the MIP images confirms the above findings. IMPRESSION: 1. Acute bilateral segmental and subsegmental pulmonary emboli as detailed above. Positive for acute PE with CT evidence of borderline right heart strain (RV/LV Ratio = 0.9) consistent with at least submassive (intermediate risk) PE. The presence of right heart strain has been associated with an increased risk of morbidity and mortality. 2. There is an apparent focal narrowing of the main right pulmonary artery as detailed above with a possible web at this location. There is some poststenotic dilatation of the right pulmonary artery beyond this web. This appearance may be secondary to a chronic pulmonary embolus versus an iatrogenic or traumatic injury. Follow-up is recommended. Consider outpatient cardiology workup and a subsequent PE study following treatment of the patient's acute pulmonary emboli to evaluate for resolution of this finding. This finding appears to contribute to significant dilatation of  the main pulmonary artery and the main left pulmonary arteries. 3. Cluster of nodules in the right upper lobe with associated areas of bronchial wall thickening and bronchiectasis is favored to be post infectious in etiology or secondary to a chronic indolent infection such as MAI. Follow-up is recommended to confirm resolution or stability of the pulmonary nodules, which currently measure up to approximately 9 mm. A three-month follow-up CT is recommended. 4. Multiple calcified mediastinal and hilar lymph nodes are noted in addition to calcifications throughout the liver and spleen. These findings are likely related to a prior granulomatous infection. These results were called by telephone at the time of interpretation on 10/04/2018 at 9:02 pm to Dr. Milton Ferguson , who verbally acknowledged these results. Electronically Signed   By: Constance Holster M.D.   On: 10/04/2018 21:06   Mr Brain Wo Contrast  Result Date: 10/05/2018 CLINICAL DATA:  Generalized muscle weakness EXAM: MRI HEAD WITHOUT CONTRAST TECHNIQUE: Multiplanar, multiecho pulse sequences of the brain and surrounding structures were obtained without intravenous contrast. COMPARISON:  Head CT 7 days ago FINDINGS: Brain: No acute infarction, hemorrhage, hydrocephalus, extra-axial collection or mass lesion. Mild for age small-vessel ischemic type change in the deep cerebral  white matter. Age normal brain volume. Vascular: Major flow voids are preserved Skull and upper cervical spine: Negative for marrow lesion Sinuses/Orbits: Negative IMPRESSION: Unremarkable brain MRI for age. Electronically Signed   By: Monte Fantasia M.D.   On: 10/05/2018 09:12   US Venous Img Lower Bilateral  Result Date: 10/05/2018 CLINICAL DATA:  Bilateral lower extremity pain and edema. History of pulmonary embolism. Evaluate for DVT. EXAM: BILATERAL LOWER EXTREMITY VENOUS DOPPLER ULTRASOUND TECHNIQUE: Gray-scale sonography with graded compression, as well as color Doppler  and duplex ultrasound were performed to evaluate the lower extremity deep venous systems from the level of the common femoral vein and including the common femoral, femoral, profunda femoral, popliteal and calf veins including the posterior tibial, peroneal and gastrocnemius veins when visible. The superficial great saphenous vein was also interrogated. Spectral Doppler was utilized to evaluate flow at rest and with distal augmentation maneuvers in the common femoral, femoral and popliteal veins. COMPARISON:  None. FINDINGS: RIGHT LOWER EXTREMITY Common Femoral Vein: No evidence of thrombus. Normal compressibility, respiratory phasicity and response to augmentation. Saphenofemoral Junction: No evidence of thrombus. Normal compressibility and flow on color Doppler imaging. Profunda Femoral Vein: No evidence of thrombus. Normal compressibility and flow on color Doppler imaging. Femoral Vein: No evidence of thrombus. Normal compressibility, respiratory phasicity and response to augmentation. Popliteal Vein: No evidence of thrombus. Normal compressibility, respiratory phasicity and response to augmentation. Calf Veins: No evidence of thrombus. Normal compressibility and flow on color Doppler imaging. Superficial Great Saphenous Vein: No evidence of thrombus. Normal compressibility. Venous Reflux:  None. Other Findings:  None. LEFT LOWER EXTREMITY Common Femoral Vein: No evidence of thrombus. Normal compressibility, respiratory phasicity and response to augmentation. Saphenofemoral Junction: No evidence of thrombus. Normal compressibility and flow on color Doppler imaging. Profunda Femoral Vein: No evidence of thrombus. Normal compressibility and flow on color Doppler imaging. Femoral Vein: No evidence of thrombus. Normal compressibility, respiratory phasicity and response to augmentation. Popliteal Vein: No evidence of thrombus. Normal compressibility, respiratory phasicity and response to augmentation. Calf Veins:  Hypoechoic occlusive thrombus is seen within the left posterior tibial (image 61) and both paired peroneal veins (image 65). Superficial Great Saphenous Vein: No evidence of thrombus. Normal compressibility. Other Findings:  None. IMPRESSION: 1. Examination is positive for occlusive DVT involving the left posterior tibial and peroneal veins. There is no extension of this distal short-segment tibial DVT to the more proximal venous system of the left lower extremity. 2. No evidence of DVT within the right lower extremity. Electronically Signed   By: Sandi Mariscal M.D.   On: 10/05/2018 10:46   Dg Chest Portable 1 View  Result Date: 10/04/2018 CLINICAL DATA:  Shortness of breath. EXAM: PORTABLE CHEST 1 VIEW COMPARISON:  Chest x-ray dated September 28, 2018. FINDINGS: The heart size and mediastinal contours are within normal limits. Normal pulmonary vascularity. No focal consolidation, pleural effusion, or pneumothorax. No acute osseous abnormality. IMPRESSION: No active disease. Electronically Signed   By: Titus Dubin M.D.   On: 10/04/2018 16:40        Scheduled Meds:  levothyroxine  75 mcg Oral QAC breakfast   risperiDONE  1.5 mg Oral BID   traZODone  150 mg Oral QHS   venlafaxine XR  75 mg Oral Daily   Continuous Infusions:  sodium chloride 100 mL/hr at 10/05/18 0914   heparin 1,450 Units/hr (10/05/18 0910)     LOS: 1 day    Time spent: 30 minutes    Nakema Fake Darleen Crocker, DO  [facility] Pager [phone]  If 7PM-7AM, please contact night-coverage [email or web address] Password [facility] 10/05/2018, 11:38 AM

## 2018-10-05 NOTE — TOC Initial Note (Signed)
Transition of Care [facility]) - Initial/Assessment Note    Patient Details  Name: Jamie Pitts MRN: [id number] Date of Birth: 06/27/1933  [facility]) CM/SW Contact:    Boneta Lucks, RN Phone Number: 10/05/2018, 11:44 AM  Clinical Narrative:      Patient admitted for PE in the unit. SW consult for nursing home placement . Spoke with her [name].  Patient lives at home with her 83 year old husband. [name] moved them in with her for the weekend. Patient having worsen back pain. She really wishes to take her back home due to [place], however she would need her to be able to walk. [name] had called for in home Palliative consult and home health a couple days ago.  Since in the hospital now she is asking for Palliative to see her, MD updated so they can discuss goals of care for discharge planning.     TOC to follow.         Barriers to Discharge: Continued Medical Work up   Patient Goals and CMS Choice        Expected Discharge Plan and Services     waiting on Palliative consult    Prior Living Arrangements/Services   Lives with:: Spouse, Adult Children                   Activities of Daily Living Home Assistive Devices/Equipment: Oxygen ADL Screening (condition at time of admission) Patient's cognitive ability adequate to safely complete daily activities?: Yes Is the patient deaf or have difficulty hearing?: No Does the patient have difficulty seeing, even when wearing glasses/contacts?: No Does the patient have difficulty concentrating, remembering, or making decisions?: No Patient able to express need for assistance with ADLs?: Yes Does the patient have difficulty dressing or bathing?: Yes Independently performs ADLs?: No Communication: Independent Dressing (OT): Needs assistance Is this a change from baseline?: Pre-admission baseline Grooming: Needs assistance Is this a change from baseline?: Pre-admission baseline Feeding: Needs assistance Is  this a change from baseline?: Pre-admission baseline Bathing: Needs assistance Is this a change from baseline?: Pre-admission baseline Toileting: Needs assistance Is this a change from baseline?: Pre-admission baseline In/Out Bed: Needs assistance Is this a change from baseline?: Pre-admission baseline Walks in Home: Needs assistance Is this a change from baseline?: Pre-admission baseline Does the patient have difficulty walking or climbing stairs?: Yes Weakness of Legs: Both Weakness of Arms/Hands: Both             Admission diagnosis:  PE (pulmonary thromboembolism) ([place]) [I26.99] Falls [W19.XXXA] Multiple pulmonary emboli ([place]) [I26.99] Patient Active Problem List   Diagnosis Date Noted  . PE (pulmonary thromboembolism) ([place]) 10/04/2018  . Vitamin B12 deficiency 09/07/2018  . Vitamin D deficiency 09/07/2018  . Bilateral leg pain 08/13/2018  . Psychosis in elderly ([place]) 07/24/2018  . Malnutrition of moderate degree 05/20/2017  . Fecal impaction ([place]) 05/18/2017  . Abdominal pain 05/18/2017  . Long-term current use of opiate analgesic 03/06/2017  . Chronic pain syndrome 03/06/2017  . Nausea 03/04/2017  . Abdominal cramping 05/24/2016  . Perforation of right tympanic membrane 09/03/2015  . COPD (chronic obstructive pulmonary disease) ([place]) 12/07/2014  . Constipation 05/08/2014  . Urinary retention 05/08/2014  . Urinary urgency 04/05/2014  . Chronic cough 03/02/2013  . Dysphagia 11/05/2012  . Drug-induced Parkinsonism ([place]) 07/29/2012  . Insomnia secondary to depression with anxiety 05/22/2012  . Chronic low back pain 04/14/2007  . Hypothyroidism 11/04/2006  . GERD 11/04/2006  . OSTEOPOROSIS 11/04/2006  .  Depression, major, recurrent, severe with psychosis ([place]) 08/29/2006   PCP:  Ria Bush, MD Pharmacy:   [facility], [address] [place] [address] Phone: [phone] Fax:  [facility] #[id number] [place], [place] - [address] [place] [address] Phone: [phone] Fax: [phone]         Readmission Risk Interventions No flowsheet data found.

## 2018-10-05 NOTE — Progress Notes (Signed)
GI panel and Cdiff sample collected and sent to lab. Awaiting results.

## 2018-10-05 NOTE — Telephone Encounter (Signed)
Noted! Thank you

## 2018-10-06 DIAGNOSIS — R531 Weakness: Secondary | ICD-10-CM

## 2018-10-06 DIAGNOSIS — F339 Major depressive disorder, recurrent, unspecified: Secondary | ICD-10-CM

## 2018-10-06 DIAGNOSIS — Z7189 Other specified counseling: Secondary | ICD-10-CM

## 2018-10-06 DIAGNOSIS — Z515 Encounter for palliative care: Secondary | ICD-10-CM

## 2018-10-06 DIAGNOSIS — Z66 Do not resuscitate: Secondary | ICD-10-CM

## 2018-10-06 DIAGNOSIS — W19XXXA Unspecified fall, initial encounter: Secondary | ICD-10-CM

## 2018-10-06 LAB — CBC
HCT: 32.7 % — ABNORMAL LOW (ref 36.0–46.0)
Hemoglobin: 10 g/dL — ABNORMAL LOW (ref 12.0–15.0)
MCH: 27 pg (ref 26.0–34.0)
MCHC: 30.6 g/dL (ref 30.0–36.0)
MCV: 88.4 fL (ref 80.0–100.0)
Platelets: 423 10*3/uL — ABNORMAL HIGH (ref 150–400)
RBC: 3.7 MIL/uL — ABNORMAL LOW (ref 3.87–5.11)
RDW: 14 % (ref 11.5–15.5)
WBC: 14.9 10*3/uL — ABNORMAL HIGH (ref 4.0–10.5)
nRBC: 0 % (ref 0.0–0.2)

## 2018-10-06 LAB — C DIFFICILE QUICK SCREEN W PCR REFLEX
C Diff antigen: NEGATIVE
C Diff interpretation: NOT DETECTED
C Diff toxin: NEGATIVE

## 2018-10-06 LAB — URINE CULTURE: Culture: NO GROWTH

## 2018-10-06 LAB — T4, FREE: Free T4: 1.26 ng/dL — ABNORMAL HIGH (ref 0.61–1.12)

## 2018-10-06 LAB — BASIC METABOLIC PANEL
Anion gap: 11 (ref 5–15)
BUN: 17 mg/dL (ref 8–23)
CO2: 20 mmol/L — ABNORMAL LOW (ref 22–32)
Calcium: 7.7 mg/dL — ABNORMAL LOW (ref 8.9–10.3)
Chloride: 96 mmol/L — ABNORMAL LOW (ref 98–111)
Creatinine, Ser: 0.96 mg/dL (ref 0.44–1.00)
GFR calc Af Amer: >60 mL/min (ref >60)
GFR calc non Af Amer: 55 mL/min — ABNORMAL LOW (ref >60)
Glucose, Bld: 99 mg/dL (ref 70–99)
Potassium: 4.1 mmol/L (ref 3.5–5.1)
Sodium: 127 mmol/L — ABNORMAL LOW (ref 135–145)

## 2018-10-06 LAB — HEPARIN LEVEL (UNFRACTIONATED)
Heparin Unfractionated: 0.4 IU/mL (ref 0.30–0.70)
Heparin Unfractionated: 0.42 IU/mL (ref 0.30–0.70)

## 2018-10-06 LAB — MRSA PCR SCREENING: MRSA by PCR: NEGATIVE

## 2018-10-06 MED ORDER — CALCIUM GLUCONATE-NACL 1-0.675 GM/50ML-% IV SOLN
1.0000 g | Freq: Once | INTRAVENOUS | Status: AC
  Administered 2018-10-06: 09:00:00 1000 mg via INTRAVENOUS
  Filled 2018-10-06: qty 50

## 2018-10-06 MED ORDER — CHLORHEXIDINE GLUCONATE CLOTH 2 % EX PADS
6.0000 | MEDICATED_PAD | Freq: Every day | CUTANEOUS | Status: DC
  Administered 2018-10-06 – 2018-10-09 (×4): 6 via TOPICAL

## 2018-10-06 MED ORDER — SODIUM CHLORIDE 0.9 % IV SOLN
INTRAVENOUS | Status: DC
  Administered 2018-10-06 – 2018-10-08 (×5): via INTRAVENOUS

## 2018-10-06 MED ORDER — ORAL CARE MOUTH RINSE
15.0000 mL | Freq: Two times a day (BID) | OROMUCOSAL | Status: DC
  Administered 2018-10-06 – 2018-10-09 (×7): 15 mL via OROMUCOSAL

## 2018-10-06 MED ORDER — HYDROCORTISONE 1 % EX CREA
TOPICAL_CREAM | Freq: Two times a day (BID) | CUTANEOUS | Status: DC
  Administered 2018-10-06 – 2018-10-10 (×9): via TOPICAL
  Filled 2018-10-06: qty 28

## 2018-10-06 NOTE — Plan of Care (Signed)
  Problem: Acute Rehab PT Goals(only PT should resolve) Goal: Pt Will Go Supine/Side To Sit Outcome: Progressing Flowsheets (Taken 10/06/2018 1102) Pt will go Supine/Side to Sit:  with min guard assist  with minimal assist Goal: Patient Will Transfer Sit To/From Stand Outcome: Progressing Flowsheets (Taken 10/06/2018 1102) Patient will transfer sit to/from stand: with minimal assist Goal: Pt Will Transfer Bed To Chair/Chair To Bed Outcome: Progressing Flowsheets (Taken 10/06/2018 1102) Pt will Transfer Bed to Chair/Chair to Bed: with min assist Goal: Pt Will Ambulate Outcome: Progressing Flowsheets (Taken 10/06/2018 1102) Pt will Ambulate:  25 feet  with minimal assist  with moderate assist  with rolling walker   11:03 AM, 10/06/18 Lonell Grandchild, MPT Physical Therapist with [facility] [phone] office [phone] mobile phone

## 2018-10-06 NOTE — Progress Notes (Addendum)
PROGRESS NOTE    Jamie Pitts  JGO:[id number] DOB: May 13, 1933 DOA: 10/04/2018 PCP: Ria Bush, MD   Brief Narrative:  Per HPI: Jamie Pitts a 85 y.o.femalewith medical history significant forParkinson's disease, hypothyroidism depression with psychosis and COPD, was brought to the ED with complaints of generalized weakness, increased confusion and falls over the past several months. Patient tells me she has difficulty breathing, butdenies chest pain. Patient tells me she has not been eating much over the past several days,due to poor appetite. She denies vomiting or loose stools. Patient tells me she feels weak all over. Patient lives with her husband. Patient's daughter called EMS due to gradual decline over the past several months. I talked to patient's daughter, she reports that patient was discharged from the psych facility after a 2-week stay on 23 July. She has chronic back pain and her chronic narcotics wererecently discontinued at the psych facility as it was thought that her hydrocodone was causing auditory hallucinations. Sopatient has been lying in bed most of the day. Daughter also noticed difficulty breathing over the past week at rest and significantly worse with minimal exertion.Daughter reports multiple falls, at least3 falls within 1 week.Daughter reports 3-5 loose stools daily over the past 2 weeks.  Patient was admitted with bilateral submassive pulmonary embolism with demonstrated RV strain on CT scan.  2D echocardiogram currently pending.  Bilateral lower extremity ultrasound with left-sided occlusive DVT noted and no DVT to the right lower extremity.  Brain MRI performed for generalized weakness with no acute findings.  PT evaluation reveals need for SNF.  8/18: Seen by palliative care today and family members contemplating SNF versus hospice placement soon.  Patient is to remain on heparin drip for 4 more days prior to discharge.  2D  echocardiogram with LVEF 60-65% and no significant strain noted.  Stool C. difficile negative and GI panel pending.  TSH elevated at 22.12 and free T4 pending.  She is noted to be hyponatremic and will remain on IV normal saline.  She is also noted to have hypocalcemia which will be further evaluated as below.  Assessment & Plan:   Active Problems:   PE (pulmonary thromboembolism) (HCC)   Acute hypoxemic respiratory failure secondary to submassive bilateral PE with left occlusive DVT -This is all likely secondary to recent immobility with gradual decline in functional status over several months -Okay for management in stepdown unit on heparin drip with stable vital signs noted; will need to remain on heparin drip for total 5 days, at which point patient could be transitioned to Eliquis per recommendations from Dr. Luan Pulling -2D echocardiogram with LVEF 60-65% -We will check Doppler pulses of left and right lower extremities routinely as needed, however clinically has palpable pulses currently.  Generalized weakness/failure to thrive over several months -Appreciate PT evaluation with recommendations for SNF -Palliative following and family members debating between SNF and hospice -Would like to pursue outpatient lumbar epidural to see if patient might be able to participate in rehab-this was discussed with palliative care  Multiple falls- multifactorial with fragility and deconditioning -No evidence of trauma or pelvic fracture noted -Fall precautions and PT evaluation  Diarrhea -Stool C. difficile negative -Continue hydration -GI panel pending  Hyponatremia-likely secondary to poor p.o. intake -Continue normal saline and trend with repeat BMP in a.m.  Hypothyroidism -Continue home Synthroid -TSH noted to be 22.12 and will check free T4  Hypocalcemia -Replete with calcium gluconate today -Check PTH and ionized calcium level  COPD-stable -Duo nebs as needed  for wheezing   History of parkinsonism/depression with psychosis -Continue home risperidone, venlafaxine and trazodone -Holding lamotrigine as it does not appear patient has been taking this at home  Chronic pain -Hydrocodone recently discontinued as it appeared to be causing her auditory hallucinations  DVT prophylaxis: Heparin drip Code Status: DNR Family Communication: We will discuss with [name] Disposition Plan: Continue treatment with IV heparin drip.  Appreciate palliative care consultation.  SNF versus hospice placement on discharge.  Okay for transfer to telemetry today.   Consultants:   Palliative Care  Procedures:   None  Antimicrobials:   None   Subjective: Patient seen and evaluated today with no new acute complaints or concerns. No acute concerns or events noted overnight.  Objective: Vitals:   10/06/18 0747 10/06/18 0800 10/06/18 0900 10/06/18 1000  BP:  117/62 125/66 (!) 118/58  Pulse: (!) 104 74 (!) 108 (!) 107  Resp: 13 14 19 15   Temp: 97.6 F (36.4 C)     TempSrc: Oral     SpO2: 94% 93% 94% 93%  Weight:      Height:        Intake/Output Summary (Last 24 hours) at 10/06/2018 1505 Last data filed at 10/06/2018 0800 Gross per 24 hour  Intake 1219.28 ml  Output 700 ml  Net 519.28 ml   Filed Weights   10/04/18 1435 10/05/18 1123 10/06/18 0500  Weight: 77.6 kg 76.7 kg 72 kg    Examination:  General exam: Appears calm and comfortable  Respiratory system: Clear to auscultation. Respiratory effort normal.  Currently on 5 L nasal cannula. Cardiovascular system: S1 & S2 heard, RRR. No JVD, murmurs, rubs, gallops or clicks. No pedal edema. Gastrointestinal system: Abdomen is nondistended, soft and nontender. No organomegaly or masses felt. Normal bowel sounds heard. Central nervous system: Alert and awake Extremities: Symmetric 5 x 5 power.  Posterior tibialis pulses and dorsalis pedis pulses present bilaterally. Skin: No rashes, lesions or ulcers  Psychiatry: Difficult to assess.    Data Reviewed: I have personally reviewed following labs and imaging studies  CBC: Recent Labs  Lab 10/04/18 1540 10/05/18 0531 10/06/18 0157  WBC 14.2* 13.0* 14.9*  NEUTROABS 11.1*  --   --   HGB 11.1* 10.3* 10.0*  HCT 36.2 33.7* 32.7*  MCV 86.0 87.3 88.4  PLT 446* 392 782*   Basic Metabolic Panel: Recent Labs  Lab 10/04/18 1540 10/05/18 0531 10/06/18 0157  NA 126* 129* 127*  K 4.0 4.3 4.1  CL 92* 96* 96*  CO2 23 24 20*  GLUCOSE 84 90 99  BUN 12 14 17   CREATININE 1.03* 1.00 0.96  CALCIUM 8.5* 8.5* 7.7*   GFR: Estimated Creatinine Clearance: 45.9 mL/min (by C-G formula based on SCr of 0.96 mg/dL). Liver Function Tests: Recent Labs  Lab 10/04/18 1540  AST 14*  ALT 17  ALKPHOS 98  BILITOT 0.6  PROT 6.4*  ALBUMIN 2.3*   No results for input(s): LIPASE, AMYLASE in the last 168 hours. No results for input(s): AMMONIA in the last 168 hours. Coagulation Profile: No results for input(s): INR, PROTIME in the last 168 hours. Cardiac Enzymes: No results for input(s): CKTOTAL, CKMB, CKMBINDEX, TROPONINI in the last 168 hours. BNP (last 3 results) No results for input(s): PROBNP in the last 8760 hours. HbA1C: No results for input(s): HGBA1C in the last 72 hours. CBG: No results for input(s): GLUCAP in the last 168 hours. Lipid Profile: No results for input(s): CHOL, HDL, LDLCALC, TRIG, CHOLHDL, LDLDIRECT in  the last 72 hours. Thyroid Function Tests: Recent Labs    10/05/18 1211  TSH 22.127*   Anemia Panel: No results for input(s): VITAMINB12, FOLATE, FERRITIN, TIBC, IRON, RETICCTPCT in the last 72 hours. Sepsis Labs: No results for input(s): PROCALCITON, LATICACIDVEN in the last 168 hours.  Recent Results (from the past 240 hour(s))  SARS Coronavirus 2 [facility] order, Performed in [facility] hospital lab) Nasopharyngeal Nasopharyngeal Swab     Status: None   Collection Time: 10/04/18  4:26 PM   Specimen:  Nasopharyngeal Swab  Result Value Ref Range Status   SARS Coronavirus 2 NEGATIVE NEGATIVE Final    Comment: (NOTE) If result is NEGATIVE SARS-CoV-2 target nucleic acids are NOT DETECTED. The SARS-CoV-2 RNA is generally detectable in upper and lower  respiratory specimens during the acute phase of infection. The lowest  concentration of SARS-CoV-2 viral copies this assay can detect is 250  copies / mL. A negative result does not preclude SARS-CoV-2 infection  and should not be used as the sole basis for treatment or other  patient management decisions.  A negative result may occur with  improper specimen collection / handling, submission of specimen other  than nasopharyngeal swab, presence of viral mutation(s) within the  areas targeted by this assay, and inadequate number of viral copies  (<250 copies / mL). A negative result must be combined with clinical  observations, patient history, and epidemiological information. If result is POSITIVE SARS-CoV-2 target nucleic acids are DETECTED. The SARS-CoV-2 RNA is generally detectable in upper and lower  respiratory specimens dur ing the acute phase of infection.  Positive  results are indicative of active infection with SARS-CoV-2.  Clinical  correlation with patient history and other diagnostic information is  necessary to determine patient infection status.  Positive results do  not rule out bacterial infection or co-infection with other viruses. If result is PRESUMPTIVE POSTIVE SARS-CoV-2 nucleic acids MAY BE PRESENT.   A presumptive positive result was obtained on the submitted specimen  and confirmed on repeat testing.  While 2019 novel coronavirus  (SARS-CoV-2) nucleic acids may be present in the submitted sample  additional confirmatory testing may be necessary for epidemiological  and / or clinical management purposes  to differentiate between  SARS-CoV-2 and other Sarbecovirus currently known to infect humans.  If clinically  indicated additional testing with an alternate test  methodology [phone]) is advised. The SARS-CoV-2 RNA is generally  detectable in upper and lower respiratory sp ecimens during the acute  phase of infection. The expected result is Negative. Fact Sheet for Patients:  StrictlyIdeas.no Fact Sheet for Healthcare Providers: BankingDealers.co.za This test is not yet approved or cleared by the [place] FDA and has been authorized for detection and/or diagnosis of SARS-CoV-2 by FDA under an Emergency Use Authorization (EUA).  This EUA will remain in effect (meaning this test can be used) for the duration of the COVID-19 declaration under Section 564(b)(1) of the Act, 21 U.S.C. section 360bbb-3(b)(1), unless the authorization is terminated or revoked sooner. Performed at [facility], [address], [place], [place] [address]   Urine Culture     Status: None   Collection Time: 10/04/18  4:41 PM   Specimen: Urine, Clean Catch  Result Value Ref Range Status   Specimen Description   Final    URINE, CLEAN CATCH Performed at [facility], [address], [place], [place] [address]    Special Requests   Final    NONE Performed at [facility], [address], [place], [place] [address]    Culture   Final    NO GROWTH Performed at [facility], [place] [address], [place], [place] [address]    Report Status 10/06/2018 FINAL  Final  MRSA PCR Screening     Status: None   Collection Time: 10/05/18 11:35 AM   Specimen: Nasal Mucosa; Nasopharyngeal  Result Value Ref Range Status   MRSA by PCR NEGATIVE NEGATIVE Final    Comment:        The GeneXpert MRSA Assay (FDA approved for NASAL specimens only), is one component of a comprehensive MRSA colonization surveillance program. It is not intended to diagnose MRSA infection nor to guide or monitor treatment for MRSA infections. Performed at [facility], [address] [place], [place]  [address]   C difficile quick scan w PCR reflex     Status: None   Collection Time: 10/05/18 10:39 PM   Specimen: STOOL  Result Value Ref Range Status   C Diff antigen NEGATIVE NEGATIVE Final   C Diff toxin NEGATIVE NEGATIVE Final   C Diff interpretation No C. difficile detected.  Final    Comment: Performed at [facility], [address] [place], [place] [address]         Radiology Studies: Dg Pelvis 1-2 Views  Result Date: 10/04/2018 CLINICAL DATA:  Multiple falls. EXAM: PELVIS - 1-2 VIEW COMPARISON:  Pelvic radiographs 6 days ago 09/28/2018 FINDINGS: Portions of bilateral superior pubic rami are obscured by dense excreted IV contrast in the urinary bladder from chest CT earlier this day. The cortical margins of the bony pelvis are intact. No fracture. Pubic symphysis and sacroiliac joints are congruent. Both femoral heads are well-seated in the respective acetabula. IMPRESSION: No evidence of pelvic fracture. Electronically Signed   By: Keith Rake M.D.   On: 10/04/2018 23:36   Ct Angio Chest Pe W And/or Wo Contrast  Result Date: 10/04/2018 CLINICAL DATA:  Dyspnea. EXAM: CT ANGIOGRAPHY CHEST WITH CONTRAST TECHNIQUE: Multidetector CT imaging of the chest was performed using the standard protocol during bolus administration of intravenous contrast. Multiplanar CT image reconstructions and MIPs were obtained to evaluate the vascular anatomy. CONTRAST:  148mL OMNIPAQUE IOHEXOL 350 MG/ML SOLN COMPARISON:  None. FINDINGS: Cardiovascular: Contrast injection is sufficient to demonstrate satisfactory opacification of the pulmonary arteries to the segmental level.The main pulmonary artery is dilated measuring 3.2 cm in diameter. The left pulmonary artery is dilated measuring 2.8 cm in diameter. There is an apparent stenosis or web involving the mid main right pulmonary artery (axial series 5, image 121). There is some poststenotic dilatation of the main right pulmonary artery. There are acute  segmental and subsegmental bilateral pulmonary emboli. There is some significant narrowing of the right upper lobe pulmonary artery. The heart size is mildly enlarged. There is no significant pericardial effusion. There is borderline right heart strain with an RV/LV ratio measuring approximately 0.9 Mediastinum/Nodes: --there are multiple small calcified mediastinal and hilar lymph nodes bilaterally. --No axillary lymphadenopathy. --No supraclavicular lymphadenopathy. --Normal thyroid gland. --The esophagus is unremarkable Lungs/Pleura: There are scattered areas of scarring and bronchiectasis involving the right upper lobe with associated pulmonary nodules measuring up to approximately 9 mm. There is atelectasis at the lung bases bilaterally. There is no significant pleural effusion. There is no pneumothorax. The trachea is unremarkable. Upper Abdomen: There are multiple calcifications throughout the spleen likely related to a prior granulomatous infection. The spleen may be enlarged but is only partially visualized. There are few  calcifications in the liver, again likely related to a prior granulomatous infection. Musculoskeletal: There is no acute displaced fracture or dislocation. There is a probable old healed fracture of the sternum. Review of the MIP images confirms the above findings. IMPRESSION: 1. Acute bilateral segmental and subsegmental pulmonary emboli as detailed above. Positive for acute PE with CT evidence of borderline right heart strain (RV/LV Ratio = 0.9) consistent with at least submassive (intermediate risk) PE. The presence of right heart strain has been associated with an increased risk of morbidity and mortality. 2. There is an apparent focal narrowing of the main right pulmonary artery as detailed above with a possible web at this location. There is some poststenotic dilatation of the right pulmonary artery beyond this web. This appearance may be secondary to a chronic pulmonary embolus  versus an iatrogenic or traumatic injury. Follow-up is recommended. Consider outpatient cardiology workup and a subsequent PE study following treatment of the patient's acute pulmonary emboli to evaluate for resolution of this finding. This finding appears to contribute to significant dilatation of the main pulmonary artery and the main left pulmonary arteries. 3. Cluster of nodules in the right upper lobe with associated areas of bronchial wall thickening and bronchiectasis is favored to be post infectious in etiology or secondary to a chronic indolent infection such as MAI. Follow-up is recommended to confirm resolution or stability of the pulmonary nodules, which currently measure up to approximately 9 mm. A three-month follow-up CT is recommended. 4. Multiple calcified mediastinal and hilar lymph nodes are noted in addition to calcifications throughout the liver and spleen. These findings are likely related to a prior granulomatous infection. These results were called by telephone at the time of interpretation on 10/04/2018 at 9:02 pm to Dr. Milton Ferguson , who verbally acknowledged these results. Electronically Signed   By: Constance Holster M.D.   On: 10/04/2018 21:06   Mr Brain Wo Contrast  Result Date: 10/05/2018 CLINICAL DATA:  Generalized muscle weakness EXAM: MRI HEAD WITHOUT CONTRAST TECHNIQUE: Multiplanar, multiecho pulse sequences of the brain and surrounding structures were obtained without intravenous contrast. COMPARISON:  Head CT 7 days ago FINDINGS: Brain: No acute infarction, hemorrhage, hydrocephalus, extra-axial collection or mass lesion. Mild for age small-vessel ischemic type change in the deep cerebral white matter. Age normal brain volume. Vascular: Major flow voids are preserved Skull and upper cervical spine: Negative for marrow lesion Sinuses/Orbits: Negative IMPRESSION: Unremarkable brain MRI for age. Electronically Signed   By: Monte Fantasia M.D.   On: 10/05/2018 09:12   US  Venous Img Lower Bilateral  Result Date: 10/05/2018 CLINICAL DATA:  Bilateral lower extremity pain and edema. History of pulmonary embolism. Evaluate for DVT. EXAM: BILATERAL LOWER EXTREMITY VENOUS DOPPLER ULTRASOUND TECHNIQUE: Gray-scale sonography with graded compression, as well as color Doppler and duplex ultrasound were performed to evaluate the lower extremity deep venous systems from the level of the common femoral vein and including the common femoral, femoral, profunda femoral, popliteal and calf veins including the posterior tibial, peroneal and gastrocnemius veins when visible. The superficial great saphenous vein was also interrogated. Spectral Doppler was utilized to evaluate flow at rest and with distal augmentation maneuvers in the common femoral, femoral and popliteal veins. COMPARISON:  None. FINDINGS: RIGHT LOWER EXTREMITY Common Femoral Vein: No evidence of thrombus. Normal compressibility, respiratory phasicity and response to augmentation. Saphenofemoral Junction: No evidence of thrombus. Normal compressibility and flow on color Doppler imaging. Profunda Femoral Vein: No evidence of thrombus. Normal compressibility and flow on  color Doppler imaging. Femoral Vein: No evidence of thrombus. Normal compressibility, respiratory phasicity and response to augmentation. Popliteal Vein: No evidence of thrombus. Normal compressibility, respiratory phasicity and response to augmentation. Calf Veins: No evidence of thrombus. Normal compressibility and flow on color Doppler imaging. Superficial Great Saphenous Vein: No evidence of thrombus. Normal compressibility. Venous Reflux:  None. Other Findings:  None. LEFT LOWER EXTREMITY Common Femoral Vein: No evidence of thrombus. Normal compressibility, respiratory phasicity and response to augmentation. Saphenofemoral Junction: No evidence of thrombus. Normal compressibility and flow on color Doppler imaging. Profunda Femoral Vein: No evidence of thrombus.  Normal compressibility and flow on color Doppler imaging. Femoral Vein: No evidence of thrombus. Normal compressibility, respiratory phasicity and response to augmentation. Popliteal Vein: No evidence of thrombus. Normal compressibility, respiratory phasicity and response to augmentation. Calf Veins: Hypoechoic occlusive thrombus is seen within the left posterior tibial (image 61) and both paired peroneal veins (image 65). Superficial Great Saphenous Vein: No evidence of thrombus. Normal compressibility. Other Findings:  None. IMPRESSION: 1. Examination is positive for occlusive DVT involving the left posterior tibial and peroneal veins. There is no extension of this distal short-segment tibial DVT to the more proximal venous system of the left lower extremity. 2. No evidence of DVT within the right lower extremity. Electronically Signed   By: Sandi Mariscal M.D.   On: 10/05/2018 10:46   Dg Chest Portable 1 View  Result Date: 10/04/2018 CLINICAL DATA:  Shortness of breath. EXAM: PORTABLE CHEST 1 VIEW COMPARISON:  Chest x-ray dated September 28, 2018. FINDINGS: The heart size and mediastinal contours are within normal limits. Normal pulmonary vascularity. No focal consolidation, pleural effusion, or pneumothorax. No acute osseous abnormality. IMPRESSION: No active disease. Electronically Signed   By: Titus Dubin M.D.   On: 10/04/2018 16:40        Scheduled Meds: . Chlorhexidine Gluconate Cloth  6 each Topical Daily  . levothyroxine  75 mcg Oral QAC breakfast  . mouth rinse  15 mL Mouth Rinse BID  . risperiDONE  1.5 mg Oral BID  . traZODone  150 mg Oral QHS  . venlafaxine XR  75 mg Oral Daily   Continuous Infusions: . sodium chloride 75 mL/hr at 10/06/18 0916  . heparin 1,600 Units/hr (10/06/18 0905)     LOS: 2 days    Time spent: 30 minutes    [place]ana Walthall Darleen Crocker, DO [facility] Pager [phone]  If 7PM-7AM, please contact night-coverage [email or web address] Password [facility] 10/06/2018,  3:05 PM

## 2018-10-06 NOTE — Evaluation (Signed)
Physical Therapy Evaluation Patient Details Name: Jamie Pitts MRN: [id number] DOB: [birth year] Today's Date: 10/06/2018   History of Present Illness  Jamie Pitts is a 85 y.o. female with medical history significant for Parkinson's disease, hypothyroidism depression with psychosis and COPD, was brought to the ED with complaints of generalized weakness, increased confusion and falls over the past several months.  Patient tells me she has difficulty breathing, but denies chest pain. Patient tells me she has not been eating much over the past several days, due to poor appetite.  She denies vomiting or loose stools.  Patient tells me she feels weak all over.Patient lives with her husband.  Patient's daughter called EMS due to gradual decline over the past several months.I talked to patient's daughter, she reports that patient was discharged from the psych facility after a 2-week stay on 23 July.  She has chronic back pain and her chronic narcotics were recently discontinued at the psych facility as it was thought that her hydrocodone was causing auditory hallucinations. So patient has been lying in bed most of the day.  Daughter also noticed difficulty breathing over the past week at rest and significantly worse with minimal exertion.  Daughter reports multiple falls, at least 3 falls within 1 week.  Daughter reports 3-5 loose stools daily over the past 2 weeks.    Clinical Impression  Patient demonstrates slow labored movement for sitting up at bedside requiring assistance due to generalized weakness, unsteady on feet, at risk for falls and limited to a few steps at bedside mostly due to c/o fatigue and poor standing balance.  Patient on 5 LPM O2 with SpO2 at 95-96% after transferring to chair and tolerated staying up in chair after therapy - RN aware.  Patient will benefit from continued physical therapy in hospital and recommended venue below to increase strength, balance, endurance for safe ADLs and  gait.    Follow Up Recommendations SNF    Equipment Recommendations  None recommended by PT    Recommendations for Other Services       Precautions / Restrictions Precautions Precautions: Fall Restrictions Weight Bearing Restrictions: No      Mobility  Bed Mobility Overal bed mobility: Needs Assistance Bed Mobility: Rolling;Sidelying to Sit Rolling: Min assist;Mod assist Sidelying to sit: Mod assist       General bed mobility comments: slow labored movement  Transfers Overall transfer level: Needs assistance Equipment used: Rolling walker (2 wheeled) Transfers: Sit to/from [facility] Sit to Stand: Min assist;Mod assist Stand pivot transfers: Mod assist       General transfer comment: increased time, labored movement  Ambulation/Gait Ambulation/Gait assistance: Mod assist;Max assist Gait Distance (Feet): 5 Feet Assistive device: Rolling walker (2 wheeled) Gait Pattern/deviations: Decreased step length - right;Decreased step length - left;Decreased stride length Gait velocity: slow   General Gait Details: limited to 4-5 slow labored steps at bedside mostly due to c/o fatigue  Stairs            Wheelchair Mobility    Modified Rankin (Stroke Patients Only)       Balance Overall balance assessment: Needs assistance Sitting-balance support: Feet supported;No upper extremity supported Sitting balance-Leahy Scale: Fair     Standing balance support: During functional activity;Bilateral upper extremity supported Standing balance-Leahy Scale: Poor Standing balance comment: fair/poor using RW                             Pertinent Vitals/Pain Pain  Assessment: No/denies pain    Home Living Family/patient expects to be discharged to:: Private residence Living Arrangements: Spouse/significant other Available Help at Discharge: Family;Available PRN/intermittently Type of Home: Apartment Home Access: Level entry     Home  Layout: One level Home Equipment: Walker - 2 wheels;Wheelchair - manual;Bedside commode Additional Comments: all info from patient who may be a poor historian    Prior Function Level of Independence: Independent with assistive device(s);Needs assistance   Gait / Transfers Assistance Needed: household ambulator using RW  ADL's / Homemaking Assistance Needed: assisted by family per patient        Hand Dominance        Extremity/Trunk Assessment   Upper Extremity Assessment Upper Extremity Assessment: Generalized weakness    Lower Extremity Assessment Lower Extremity Assessment: Generalized weakness    Cervical / Trunk Assessment Cervical / Trunk Assessment: Normal  Communication   Communication: No difficulties  Cognition Arousal/Alertness: Awake/alert Behavior During Therapy: WFL for tasks assessed/performed Overall Cognitive Status: Within Functional Limits for tasks assessed                                 General Comments: appears slighlty confused      General Comments      Exercises     Assessment/Plan    PT Assessment Patient needs continued PT services  PT Problem List Decreased strength;Decreased activity tolerance;Decreased balance;Decreased mobility       PT Treatment Interventions Gait training;Functional mobility training;Therapeutic activities;Therapeutic exercise;Patient/family education    PT Goals (Current goals can be found in the Care Plan section)  Acute Rehab PT Goals Patient Stated Goal: return home with family to assist PT Goal Formulation: With patient Time For Goal Achievement: 10/20/18 Potential to Achieve Goals: Good    Frequency Min 3X/week   Barriers to discharge        Co-evaluation               AM-PAC PT "6 Clicks" Mobility  Outcome Measure Help needed turning from your back to your side while in a flat bed without using bedrails?: A Lot Help needed moving from lying on your back to sitting on  the side of a flat bed without using bedrails?: A Lot Help needed moving to and from a bed to a chair (including a wheelchair)?: A Lot Help needed standing up from a chair using your arms (e.g., wheelchair or bedside chair)?: A Lot Help needed to walk in hospital room?: A Lot Help needed climbing 3-5 steps with a railing? : Total 6 Click Score: 11    End of Session Equipment Utilized During Treatment: Oxygen Activity Tolerance: Patient tolerated treatment well;Patient limited by fatigue Patient left: in chair;with call bell/phone within reach;with chair alarm set Nurse Communication: Mobility status PT Visit Diagnosis: Unsteadiness on feet (R26.81);Other abnormalities of gait and mobility (R26.89);Muscle weakness (generalized) (M62.81)    Time: 1443-1540 PT Time Calculation (min) (ACUTE ONLY): 33 min   Charges:   PT Evaluation $PT Eval Moderate Complexity: 1 Mod PT Treatments $Therapeutic Activity: 23-37 mins        11:00 AM, 10/06/18 Lonell Grandchild, MPT Physical Therapist with [facility] [phone] office [phone] mobile phone

## 2018-10-06 NOTE — Progress Notes (Signed)
Called report to [phone] RN that will be taking care of [phone]

## 2018-10-06 NOTE — Progress Notes (Signed)
Ms. Jamie Pitts was referred to our agency, [facility] for hospice services last week.  The admission assessment nurse went out to complete her visit, but the family was not on board with hospice services at this time.  Jamie Pitts, the patient's daughter called our agency to notify us that Jamie Pitts was admitted to [facility] with possible transfer to [facility] pending.  Jamie Pitts request hospice services at hospitalization.  Call and message left by this RN to [facility] to notify her of the) same information.  Will continue to follow.  Dimas Aguas, RN Clinical Nurse Liaison [facility] [phone] cell

## 2018-10-06 NOTE — Consult Note (Signed)
Consultation Note Date: 10/06/2018   Patient Name: Jamie Pitts  DOB: 06-21-1933  MRN: [id number]  Age / Sex: 85 y.o., female  PCP: Ria Bush, MD Referring Physician: Rodena Goldmann, DO  Reason for Consultation: Establishing goals of care  HPI/Patient Profile: 85 y.o. female  with past medical history of parkinsonism/depression with psychosis, UTI, osteoporosis, hypothyroidism, hiatal hernia, GERD, anxiety, chronic back pain admitted on 10/04/2018 with confusion, falls, poor oral intake. Patient lives home wit husband. Patient recently discharged from a psych facility after a 2-week stay. Chronic narcotics for back pain recently discontinued by psych facility as it was felt her hydrocodone was causing auditory hallucinations. In ED, patient found to have bilateral submassive pulmonary embolisms with RV strain on CT scan. Bilateral lower extremity ultrasound with left-sided occlusive DVT. On heparin gtt for 5 days and then possibly transition to Eliquis. MRI head negative for acute findings. Palliative medicine consultation for goals of care.    Clinical Assessment and Goals of Care:  I have reviewed medical records, discussed with care team, and assessed the patient at bedside. [name] will wake to voice. She is oriented to person and place. She is hard of hearing and does not participate in conversation. She complains of diarrhea and nausea. RN to give prn zofran.   Met with husband Jamie Pitts) and daughter Jamie Pitts) at bedside to discuss diagnosis prognosis, [place], EOL wishes, disposition and options.  Introduced Palliative Medicine as specialized medical care for people living with serious illness. It focuses on providing relief from the symptoms and stress of a serious illness. The goal is to improve quality of life for both the patient and the family.  We discussed a brief life review of the patient. Jamie Pitts  served in [employer] (where she met her husband) and retired from the post office. Her and Jamie Pitts have been married for 61 years! Jamie Pitts is only child. They have grandchildren and great grand-children.   Mialani comes from a big family. One of her brothers, whom she was very close with, died in 08-06-2022. Since the loss of her brother, family reports worsening depression and overall health decline. She suffers from chronic pain from scoliosis and was on hydrocodone for about 10 years. The hydrocodone was causing auditory hallucinations. She was recently send to psych facility in [place] in order to be weaned off the hydrocodone. The hallucinations have stopped but Jamie Pitts's pain is no longer controlled (on Tramadol) and therefore, she has functionally declined and been sedentary. She is followed by Dr. Nelva Bush and plan was to start back injections but they were waiting on Medicare to approve.   Discussed events leading up to admission and course of hospitalization including diagnoses, interventions, and plan of care. Daughter and husband understand severity of blood clots including strain on the heart.   Advanced directives, concepts specific to code status, artifical feeding and hydration, and rehospitalization were considered and discussed. Family confirms patient's wishes for DNR code status and against heroic interventions including life support or feeding tube.  I attempted to elicit values and goals of care important to the patient and family. Daughter is hopeful if her mother can receive back injection, this would help with pain management and the ability for her to work with therapy. Daughter feels her mother should try SNF for rehab or return home with home health therapy. Husband shares his thoughts that he does not think Kaeden will tolerate any therapy and that therapy will cause her misery and pain. Husband seems to be leaning towards initiation of hospice services.   Discussed options in detail:  SNF to attempt rehab (but unfortunately she would not be allowed visitors with covid 19), vs home with home health to attempt therapy vs home with hospice services. We attempted to engage Andrienne in discussion. She places her trust in Dr. Nelva Bush and shares that if Dr. Nelva Bush is ok with her doing therapy, she may do it. Per family, patient will often just ask to sleep and they do not think she understands a sedentary life style with declining functional/nutritional status or recurrent blood clots, will lead to the end of her life.   Explained to family plan for heparin gtt x5 days and transition to Eliquis. Discussed risks with starting blood thinner with her being weak and a fall risk (three falls in three days prior to admit).   Reassured of ongoing palliative support. Jamie Pitts and I plan to follow-up tomorrow morning. Encouraged Jamie Pitts and Jamie Pitts to further discuss options tonight.   Questions and concerns were addressed.  Hard Choices booklet left for review. PMT contact information given.    SUMMARY OF RECOMMENDATIONS    GOC with husband and daughter at bedside. Family confirms patient's wishes for DNR code status and against heroic measures at EOL including life support/feeding tube.   Treat the treatable. Continue current plan of care and medical management.   Husband and daughter have different opinions on discharge plan. Daughter hopeful that if her mother can receive back injection in the near future, pain will be better managed and she will be able to work with therapy. Husband does not think she will be able to work with therapy and seems to be leaning towards comfort/hospice approach. Daughter and husband to further discuss options tonight.   PMT f/u with family tomorrow. Updated attending.   Code Status/Advance Care Planning:  DNR  Symptom Management:   Per attending  Palliative Prophylaxis:   Aspiration, Delirium Protocol, Oral Care and Turn Reposition   Psycho-social/Spiritual:   Desire for further Chaplaincy support:yes  Additional Recommendations: Caregiving  Support/Resources and Education on Hospice  Prognosis:   Unable to determine  Discharge Planning: To Be Determined      Primary Diagnoses: Present on Admission: **None**   I have reviewed the medical record, interviewed the patient and family, and examined the patient. The following aspects are pertinent.  Past Medical History:  Diagnosis Date  . Adenomatous polyp    x1  . ALLERGIC RHINITIS 12/20/2006  . Chronic anxiety   . Chronic back pain 04/14/2007   02/2017 Nelva Bush records reviewed - mild lumbar DDD and L SIJ pain s/p ESI latest 10/2016 with good effect  . Diverticulitis   . GERD (gastroesophageal reflux disease)    intermittent, treated with tums  . Hiatal hernia   . Hypothyroidism   . Insomnia   . Osteoporosis 2007   declined prolia  . Perforation of right tympanic membrane 09/03/2015   Chronic, seen by Dr Janace Hoard ENT   . Personal history of  kidney stones    x1  . Postmenopausal atrophic vaginitis 2016   rec estrace cream - [place]  . Recurrent UTI   . Scoliosis   . Severe recurrent depression with psychosis ([place])   . Urinary retention   . Vitamin D deficiency    Social History   Socioeconomic History  . Marital status: Married    Spouse name: Not on file  . Number of children: 1  . Years of education: Not on file  . Highest education level: Not on file  Occupational History  . Occupation: retired Market researcher  Social Needs  . Financial resource strain: Patient refused  . Food insecurity    Worry: Patient refused    Inability: Patient refused  . Transportation needs    Medical: Patient refused    Non-medical: Patient refused  Tobacco Use  . Smoking status: Never Smoker  . Smokeless tobacco: Never Used  Substance and Sexual Activity  . Alcohol use: No  . Drug use: No  . Sexual activity: Not Currently    Birth control/protection:  Diaphragm  Lifestyle  . Physical activity    Days per week: Patient refused    Minutes per session: Patient refused  . Stress: Patient refused  Relationships  . Social Herbalist on phone: Patient refused    Gets together: Patient refused    Attends religious service: Patient refused    Active member of club or organization: Patient refused    Attends meetings of clubs or organizations: Patient refused    Relationship status: Patient refused  Other Topics Concern  . Not on file  Social History Narrative   Lives with husband and grandson [name])   Occ: retired, Sales executive   [employer]: [employer]         Family History  Problem Relation Age of Onset  . Anxiety disorder Grandchild   . Depression Grandchild   . Cancer Sister        lung  . Healthy Sister   . Diabetes Mother   . Diabetes Father   . Schizophrenia Sister   . Diabetes Sister   . Diabetes Brother   . Cancer Sister        kidney  . Alcohol abuse Neg Hx   . Bipolar disorder Neg Hx   . Dementia Neg Hx   . Drug abuse Neg Hx   . OCD Neg Hx   . Paranoid behavior Neg Hx   . Seizures Neg Hx   . Sexual abuse Neg Hx   . Physical abuse Neg Hx    Scheduled Meds: . Chlorhexidine Gluconate Cloth  6 each Topical Daily  . hydrocortisone cream   Topical BID  . levothyroxine  75 mcg Oral QAC breakfast  . mouth rinse  15 mL Mouth Rinse BID  . risperiDONE  1.5 mg Oral BID  . traZODone  150 mg Oral QHS  . venlafaxine XR  75 mg Oral Daily   Continuous Infusions: . sodium chloride 75 mL/hr at 10/06/18 0916  . heparin 1,600 Units/hr (10/06/18 0905)   PRN Meds:.ondansetron **OR** ondansetron (ZOFRAN) IV, traMADol Medications Prior to Admission:  Prior to Admission medications   Medication Sig Start Date End Date Taking? Authorizing Provider  lamoTRIgine (LAMICTAL) 100 MG tablet Take 1 tablet (100 mg total) by mouth 2 (two) times daily. 08/13/18 08/13/19 Yes Cloria Spring, MD  levothyroxine (SYNTHROID) 75 MCG tablet 1  tablet before breakfast daily except half tablet on Sundays Patient taking differently: Take  75 mcg by mouth daily before breakfast.  06/18/18  Yes Elayne Snare, MD  loperamide (IMODIUM A-D) 2 MG tablet Take 1 tablet (2 mg total) by mouth 4 (four) times daily as needed for diarrhea or loose stools. 10/02/18 11/01/18 Yes Ria Bush, MD  naloxone [facility]) nasal spray 4 mg/0.1 mL Place 1 spray into the nose once.   Yes [provider]  risperiDONE (RISPERDAL) 3 MG tablet Take 0.5 tablets (1.5 mg total) by mouth 2 (two) times daily. 08/13/18  Yes Cloria Spring, MD  traMADol (ULTRAM) 50 MG tablet Take 1 tablet by mouth 2 (two) times a day. 08/11/18  Yes [provider]  traZODone (DESYREL) 150 MG tablet Take 1 tablet (150 mg total) by mouth at bedtime. 08/13/18  Yes Cloria Spring, MD  venlafaxine XR (EFFEXOR XR) 75 MG 24 hr capsule Take 1 capsule (75 mg total) by mouth daily. 08/13/18 08/13/19 Yes Cloria Spring, MD   Allergies  Allergen Reactions  . Amoxicillin Hives    Sores in mouth  . Buprenorphine Hcl Itching    Can tolerate if necessary Can tolerate if necessary Can tolerate if necessary  . Morphine Itching    Can tolerate if necessary  . Sulfa Antibiotics Hives    Sores in mouth Sores in mouth   . Valium [Diazepam] Itching    Can tolerate if necessary   Review of Systems  Constitutional: Positive for activity change and appetite change.  Gastrointestinal: Positive for diarrhea and nausea.  Neurological: Positive for weakness.    Physical Exam Vitals signs and nursing note reviewed.  Constitutional:      Appearance: She is ill-appearing.  HENT:     Head: Normocephalic and atraumatic.  Cardiovascular:     Rate and Rhythm: Normal rate.  Pulmonary:     Effort: No tachypnea, accessory muscle usage or respiratory distress.     Breath sounds: Normal breath sounds.  Abdominal:     Tenderness: There is no abdominal tenderness.  Skin:    General: Skin is  warm and dry.     Coloration: Skin is pale.  Neurological:     Mental Status: She is easily aroused.     Comments: Oriented to person/place. Forgetful and HOH.  Psychiatric:        Attention and Perception: She is inattentive.    Vital Signs: BP 138/64   Pulse (!) 103   Temp 97.6 F (36.4 C) (Oral)   Resp 13   Ht _0  (1.676 m)   Wt 72 kg   LMP 07/15/2012   SpO2 96%   BMI 25.62 kg/m  Pain Scale: 0-10 POSS *See Group Information*: 1-Acceptable,Awake and alert Pain Score: 0-No pain   SpO2: SpO2: 96 % O2 Device:SpO2: 96 % O2 Flow Rate: .O2 Flow Rate (L/min): 6 L/min  IO: Intake/output summary:   Intake/Output Summary (Last 24 hours) at 10/06/2018 1541 Last data filed at 10/06/2018 1500 Gross per 24 hour  Intake 1388.31 ml  Output 700 ml  Net 688.31 ml    LBM: Last BM Date: 10/06/18 Baseline Weight: Weight: 77.6 kg Most recent weight: Weight: 72 kg     Palliative Assessment/Data: PPS 40%   Flowsheet Rows     Most Recent Value  Intake Tab  Referral Department  Hospitalist  Unit at Time of Referral  ICU  Palliative Care Primary Diagnosis  Pulmonary  Palliative Care Type  New Palliative care  Reason for referral  Clarify Goals of Care  Date first  seen by Palliative Care  10/06/18  Clinical Assessment  Palliative Performance Scale Score  40%  Psychosocial & Spiritual Assessment  Palliative Care Outcomes  Patient/Family meeting held?  Yes  Who was at the meeting?  patient, daughter, husband  Palliative Care Outcomes  Clarified goals of care, Counseled regarding hospice, Provided end of life care assistance, Provided psychosocial or spiritual support, ACP counseling assistance      Time In: 1100 Time Out: 1220 Time Total: 67mn Greater than 50%  of this time was spent counseling and coordinating care related to the above assessment and plan.  Signed by:  MIhor Dow DNP, FNP-C Palliative Medicine Team  Phone: [phone]: [phone]  Please  contact Palliative Medicine Team phone at [phone] questions and concerns.  For individual provider: See AShea Evans

## 2018-10-06 NOTE — Progress Notes (Signed)
Called Daughter and told her we were moving mom to [phone]

## 2018-10-06 NOTE — Progress Notes (Signed)
[facility] for Heparin  Indication: confirmed DVT, possible PE  Allergies  Allergen Reactions  . Amoxicillin Hives    Sores in mouth  . Buprenorphine Hcl Itching    Can tolerate if necessary Can tolerate if necessary Can tolerate if necessary  . Morphine Itching    Can tolerate if necessary  . Sulfa Antibiotics Hives    Sores in mouth Sores in mouth   . Valium [Diazepam] Itching    Can tolerate if necessary    Patient Measurements: Height: 5\' 6"  (167.6 cm) Weight: 158 lb 11.7 oz (72 kg) IBW/kg (Calculated) : 59.3 Heparin Dosing Weight: HEPARIN DW (KG): 74.9  Vital Signs: Temp: 97.6 F (36.4 C) (08/18 0747) Temp Source: Oral (08/18 0747) BP: 118/58 (08/18 1000) Pulse Rate: 107 (08/18 1000)  Labs: Recent Labs    10/04/18 1540 10/04/18 2214 10/04/18 2245 10/05/18 0024 10/05/18 0531  10/05/18 1644 10/06/18 0157 10/06/18 1215  HGB 11.1*  --   --   --  10.3*  --   --  10.0*  --   HCT 36.2  --   --   --  33.7*  --   --  32.7*  --   PLT 446*  --   --   --  392  --   --  423*  --   APTT  --   --  35  --   --   --   --   --   --   HEPARINUNFRC  --   --  <0.10*  --   --    < > 0.23* 0.40 0.42  CREATININE 1.03*  --   --   --  1.00  --   --  0.96  --   TROPONINIHS  --  3  --  3  --   --   --   --   --    < > = values in this interval not displayed.    Estimated Creatinine Clearance: 45.9 mL/min (by C-G formula based on SCr of 0.96 mg/dL).   Medical History: Past Medical History:  Diagnosis Date  . Adenomatous polyp    x1  . ALLERGIC RHINITIS 12/20/2006  . Chronic anxiety   . Chronic back pain 04/14/2007   02/2017 Nelva Bush records reviewed - mild lumbar DDD and L SIJ pain s/p ESI latest 10/2016 with good effect  . Diverticulitis   . GERD (gastroesophageal reflux disease)    intermittent, treated with tums  . Hiatal hernia   . Hypothyroidism   . Insomnia   . Osteoporosis 2007   declined prolia  . Perforation of right  tympanic membrane 09/03/2015   Chronic, seen by Dr Janace Hoard ENT   . Personal history of kidney stones    x1  . Postmenopausal atrophic vaginitis 2016   rec estrace cream - [place]  . Recurrent UTI   . Scoliosis   . Severe recurrent depression with psychosis ([place])   . Urinary retention   . Vitamin D deficiency      Assessment:  Pharmacy consulted to dose heparin for this 85 yo female for possible PE. Patient has been short of breath. She hasn't been on an anti-coagulant prior to admission. Baseline CBC is WNL.  Venous duplex is + for DVT, heparin level is therapeutic at 0.42  Goal of Therapy:  Heparin level 0.3-0.7 units/ml Monitor platelets by anticoagulation protocol: Yes   Plan:  Cont heparin at 1600 units/hr Check anti-Xa level  daily while on heparin. Continue to monitor H&H and platelets.  Margot Ables, PharmD Clinical Pharmacist 10/06/2018 12:47 PM

## 2018-10-06 NOTE — Progress Notes (Signed)
Jamie Pitts for Heparin  Indication: confirmed DVT, possible PE  Allergies  Allergen Reactions  . Amoxicillin Hives    Sores in mouth  . Buprenorphine Hcl Itching    Can tolerate if necessary Can tolerate if necessary Can tolerate if necessary  . Morphine Itching    Can tolerate if necessary  . Sulfa Antibiotics Hives    Sores in mouth Sores in mouth   . Valium [Diazepam] Itching    Can tolerate if necessary    Patient Measurements: Height: 5\' 6"  (167.6 cm) Weight: 169 lb 1.5 oz (76.7 kg) IBW/kg (Calculated) : 59.3 Heparin Dosing Weight: HEPARIN DW (KG): 74.9  Vital Signs: Temp: 98.3 F (36.8 C) (08/17 1612) Temp Source: Oral (08/17 1612) BP: 111/61 (08/18 0000) Pulse Rate: 102 (08/18 0000)  Labs: Recent Labs    10/04/18 1540 10/04/18 2214  10/04/18 2245 10/05/18 0024 10/05/18 0531 10/05/18 0532 10/05/18 1644 10/06/18 0157  HGB 11.1*  --   --   --   --  10.3*  --   --  10.0*  HCT 36.2  --   --   --   --  33.7*  --   --  32.7*  PLT 446*  --   --   --   --  392  --   --  423*  APTT  --   --   --  35  --   --   --   --   --   HEPARINUNFRC  --   --    < > <0.10*  --   --  <0.10* 0.23* 0.40  CREATININE 1.03*  --   --   --   --  1.00  --   --   --   TROPONINIHS  --  3  --   --  3  --   --   --   --    < > = values in this interval not displayed.    Estimated Creatinine Clearance: 45.4 mL/min (by C-G formula based on SCr of 1 mg/dL).   Medical History: Past Medical History:  Diagnosis Date  . Adenomatous polyp    x1  . ALLERGIC RHINITIS 12/20/2006  . Chronic anxiety   . Chronic back pain 04/14/2007   02/2017 [facility] records reviewed - mild lumbar DDD and L SIJ pain s/p ESI latest 10/2016 with good effect  . Diverticulitis   . GERD (gastroesophageal reflux disease)    intermittent, treated with tums  . Hiatal hernia   . Hypothyroidism   . Insomnia   . Osteoporosis 2007   declined prolia  . Perforation of right  tympanic membrane 09/03/2015   Chronic, seen by Dr Janace Hoard ENT   . Personal history of kidney stones    x1  . Postmenopausal atrophic vaginitis 2016   rec estrace cream - [place]  . Recurrent UTI   . Scoliosis   . Severe recurrent depression with psychosis ([place])   . Urinary retention   . Vitamin D deficiency      Assessment:  Pharmacy consulted to dose heparin for this 85 yo female for possible PE. Patient has been short of breath. She hasn't been on an anti-coagulant prior to admission. Baseline CBC is WNL.  Venous duplex is + for DVT, heparin level is therapeutic after rate increase  Goal of Therapy:  Heparin level 0.3-0.7 units/ml Monitor platelets by anticoagulation protocol: Yes   Plan:  Cont heparin at 1600 units/hr  Check anti-Xa level in 6-8 hours and daily while on heparin. Continue to monitor H&H and platelets.  Narda Bonds, PharmD, BCPS Clinical Pharmacist Phone: [phone]

## 2018-10-07 LAB — GASTROINTESTINAL PANEL BY PCR, STOOL (REPLACES STOOL CULTURE)

## 2018-10-07 LAB — CBC
HCT: 30.9 % — ABNORMAL LOW (ref 36.0–46.0)
Hemoglobin: 9.6 g/dL — ABNORMAL LOW (ref 12.0–15.0)
MCH: 26.8 pg (ref 26.0–34.0)
MCHC: 31.1 g/dL (ref 30.0–36.0)
MCV: 86.3 fL (ref 80.0–100.0)
Platelets: 451 10*3/uL — ABNORMAL HIGH (ref 150–400)
RBC: 3.58 MIL/uL — ABNORMAL LOW (ref 3.87–5.11)
RDW: 14.1 % (ref 11.5–15.5)
WBC: 14.9 10*3/uL — ABNORMAL HIGH (ref 4.0–10.5)
nRBC: 0 % (ref 0.0–0.2)

## 2018-10-07 LAB — BASIC METABOLIC PANEL
Anion gap: 10 (ref 5–15)
BUN: 15 mg/dL (ref 8–23)
CO2: 20 mmol/L — ABNORMAL LOW (ref 22–32)
Calcium: 8.1 mg/dL — ABNORMAL LOW (ref 8.9–10.3)
Chloride: 101 mmol/L (ref 98–111)
Creatinine, Ser: 0.93 mg/dL (ref 0.44–1.00)
GFR calc Af Amer: >60 mL/min (ref >60)
GFR calc non Af Amer: 57 mL/min — ABNORMAL LOW (ref >60)
Glucose, Bld: 103 mg/dL — ABNORMAL HIGH (ref 70–99)
Potassium: 3.9 mmol/L (ref 3.5–5.1)
Sodium: 131 mmol/L — ABNORMAL LOW (ref 135–145)

## 2018-10-07 LAB — PTH, INTACT AND CALCIUM
Calcium, Total (PTH): 7.9 mg/dL — ABNORMAL LOW (ref 8.7–10.3)
PTH: 35 pg/mL (ref 15–65)

## 2018-10-07 LAB — HEPARIN LEVEL (UNFRACTIONATED)
Heparin Unfractionated: 0.24 IU/mL — ABNORMAL LOW (ref 0.30–0.70)
Heparin Unfractionated: 0.45 IU/mL (ref 0.30–0.70)

## 2018-10-07 LAB — CALCIUM, IONIZED: Calcium, Ionized, Serum: 5 mg/dL (ref 4.5–5.6)

## 2018-10-07 MED ORDER — DICYCLOMINE HCL 10 MG PO CAPS
10.0000 mg | ORAL_CAPSULE | Freq: Three times a day (TID) | ORAL | Status: DC
  Administered 2018-10-07 – 2018-10-10 (×13): 10 mg via ORAL
  Filled 2018-10-07 (×13): qty 1

## 2018-10-07 MED ORDER — LEVOTHYROXINE SODIUM 100 MCG PO TABS
100.0000 ug | ORAL_TABLET | Freq: Every day | ORAL | Status: DC
  Administered 2018-10-08 – 2018-10-10 (×3): 100 ug via ORAL
  Filled 2018-10-07 (×3): qty 1

## 2018-10-07 MED ORDER — LOPERAMIDE HCL 2 MG PO CAPS
2.0000 mg | ORAL_CAPSULE | Freq: Four times a day (QID) | ORAL | Status: DC
  Administered 2018-10-07 – 2018-10-10 (×11): 2 mg via ORAL
  Filled 2018-10-07 (×10): qty 1

## 2018-10-07 MED ORDER — HYDROCODONE-ACETAMINOPHEN 5-325 MG PO TABS
1.0000 | ORAL_TABLET | Freq: Two times a day (BID) | ORAL | Status: DC | PRN
  Administered 2018-10-07 – 2018-10-10 (×4): 1 via ORAL
  Filled 2018-10-07 (×4): qty 1

## 2018-10-07 MED ORDER — HEPARIN BOLUS VIA INFUSION
1000.0000 [IU] | Freq: Once | INTRAVENOUS | Status: AC
  Administered 2018-10-07: 1000 [IU] via INTRAVENOUS
  Filled 2018-10-07: qty 1000

## 2018-10-07 NOTE — TOC Progression Note (Signed)
Transition of Care [facility]) - Progression Note    Patient Details  Name: Jamie Pitts MRN: [id number] Date of Birth: 11/27/1933  [facility] CM/SW Contact  Boneta Lucks, RN Phone Number: 10/07/2018, 3:11 PM  Clinical Narrative:   [name] with [name] - Daughter. Family want to try to care for her mother at home with Home Health.  She has some concerns, but feels like she needs to try to care for her Mother, the patient has taken care of everyone else.  Patient is active with [facility] for RN/PT  We will add SW, due to patient will soon require Hospice. Family may elect for her to go to [facility] at that point.  Linda with [facility] updated and will add SW.  Patient needs hospital bed, 3N1, and rolling walker.  Juliann Pulse with [facility] took the referral and will ship these items.  Advised her to speak with Daughter to get address. [name] is moving both Mother and Father in with her.     Expected Discharge Plan: [place] Barriers to Discharge: Continued Medical Work up  Expected Discharge Plan and Services Expected Discharge Plan: [place]   Discharge Planning Services: CM Consult Post Acute Care Choice: Durable Medical Equipment, Home Health Living arrangements for the past 2 months: Single Family Home                 DME Arranged: Hospital bed, 3-N-1 DME Agency: [facility] Date DME Agency Contacted: 10/07/18 Time DME Agency Contacted: [phone] Representative spoke with at DME Agency: [place]: Social Work(active with RN/PT) [place]: [place] ([place]) Date [place]: 10/07/18 Time [place]: 1437 Representative spoke with at [place]: Romualdo Bolk       Readmission Risk Interventions No flowsheet data found.

## 2018-10-07 NOTE — Progress Notes (Signed)
DME Choices: [facility] [address] [place], [place]  [phone]  [facility] [address] [place], [place] [address] [phone]  [facility] [address] [place], [place] [address] [phone] 

## 2018-10-07 NOTE — Progress Notes (Signed)
C. Diff and GI panel results negative, Dr. Roderic Palau made aware. Enteric precautions discontinued. Patient and family made aware. Patient c/o back pain with no relief from Tramadol, PRN Norco given. Husband at bedside, will continue to monitor.

## 2018-10-07 NOTE — Progress Notes (Signed)
[place] for Heparin  Indication: confirmed DVT, possible PE  Allergies  Allergen Reactions  . Amoxicillin Hives    Sores in mouth  . Buprenorphine Hcl Itching    Can tolerate if necessary Can tolerate if necessary Can tolerate if necessary  . Morphine Itching    Can tolerate if necessary  . Sulfa Antibiotics Hives    Sores in mouth Sores in mouth   . Valium [Diazepam] Itching    Can tolerate if necessary    Patient Measurements: Height: 5\' 6"  (167.6 cm) Weight: 158 lb 11.7 oz (72 kg) IBW/kg (Calculated) : 59.3  HEPARIN DW (KG): 74.9  Vital Signs: Temp: 97.7 F (36.5 C) (08/19 0546) Temp Source: Oral (08/19 0546) BP: 137/82 (08/19 0546) Pulse Rate: 110 (08/19 0546)  Labs: Recent Labs    10/04/18 2214 10/04/18 2245 10/05/18 0024 10/05/18 0531  10/06/18 0157 10/06/18 1215 10/07/18 0417  HGB  --   --   --  10.3*  --  10.0*  --  9.6*  HCT  --   --   --  33.7*  --  32.7*  --  30.9*  PLT  --   --   --  392  --  423*  --  451*  APTT  --  35  --   --   --   --   --   --   HEPARINUNFRC  --  <0.10*  --   --    < > 0.40 0.42 0.24*  CREATININE  --   --   --  1.00  --  0.96  --  0.93  TROPONINIHS 3  --  3  --   --   --   --   --    < > = values in this interval not displayed.    Estimated Creatinine Clearance: 47.4 mL/min (by C-G formula based on SCr of 0.93 mg/dL).   Medical History: Past Medical History:  Diagnosis Date  . Adenomatous polyp    x1  . ALLERGIC RHINITIS 12/20/2006  . Chronic anxiety   . Chronic back pain 04/14/2007   02/2017 [facility] records reviewed - mild lumbar DDD and L SIJ pain s/p ESI latest 10/2016 with good effect  . Diverticulitis   . GERD (gastroesophageal reflux disease)    intermittent, treated with tums  . Hiatal hernia   . Hypothyroidism   . Insomnia   . Osteoporosis 2007   declined prolia  . Perforation of right tympanic membrane 09/03/2015   Chronic, seen by Dr Janace Hoard ENT   . Personal  history of kidney stones    x1  . Postmenopausal atrophic vaginitis 2016   rec estrace cream - [place]  . Recurrent UTI   . Scoliosis   . Severe recurrent depression with psychosis ([place])   . Urinary retention   . Vitamin D deficiency      Assessment:  Pharmacy consulted to dose heparin for this 85 yo female for possible PE. Patient has been short of breath. She hasn't been on an anti-coagulant prior to admission. Baseline CBC is WNL. Family members contemplating SNF versus hospice placement soon. Patient with submassive bilateral PE with left occlusive DVT. Heparin level is subtherapeutic at 0.24. D/W nursing and no problems with infusion. Will adjust rate. Plan is for 5 days on heparin infusion then transition to eliquis.  Goal of Therapy:  Heparin level 0.3-0.7 units/ml Monitor platelets by anticoagulation protocol: Yes   Plan:  Bolus  heparin 1000 units, then increase Cont heparin at 1750 units/hr Check anti-Xa level daily while on heparin. F/U transition to po tx Continue to monitor H&H and platelets.  Isac Sarna, BS Pharm D, BCPS Clinical Pharmacist Pager [phone] 10/07/2018 8:28 AM

## 2018-10-07 NOTE — Care Management Important Message (Signed)
Important Message  Patient Details  Name: Jamie Pitts MRN: [id number] Date of Birth: 12/28/1933   Medicare Important Message Given:  Yes(given to nurse to place at bedside due to contact precautions)     [name] 10/07/2018, 2:05 PM

## 2018-10-07 NOTE — Progress Notes (Signed)
[place] for Heparin  Indication: confirmed DVT, possible PE  Allergies  Allergen Reactions  . Amoxicillin Hives    Sores in mouth  . Buprenorphine Hcl Itching    Can tolerate if necessary Can tolerate if necessary Can tolerate if necessary  . Morphine Itching    Can tolerate if necessary  . Sulfa Antibiotics Hives    Sores in mouth Sores in mouth   . Valium [Diazepam] Itching    Can tolerate if necessary    Patient Measurements: Height: 5\' 6"  (167.6 cm) Weight: 158 lb 11.7 oz (72 kg) IBW/kg (Calculated) : 59.3  HEPARIN DW (KG): 74.9  Vital Signs: Temp: 97.7 F (36.5 C) (08/19 0546) Temp Source: Oral (08/19 0546) BP: 137/82 (08/19 0546) Pulse Rate: 110 (08/19 0546)  Labs: Recent Labs    10/04/18 2214 10/04/18 2245 10/05/18 0024  10/05/18 0531  10/06/18 0157 10/06/18 1215 10/07/18 0417 10/07/18 1633  HGB  --   --   --    < > 10.3*  --  10.0*  --  9.6*  --   HCT  --   --   --   --  33.7*  --  32.7*  --  30.9*  --   PLT  --   --   --   --  392  --  423*  --  451*  --   APTT  --  35  --   --   --   --   --   --   --   --   HEPARINUNFRC  --  <0.10*  --   --   --    < > 0.40 0.42 0.24* 0.45  CREATININE  --   --   --   --  1.00  --  0.96  --  0.93  --   TROPONINIHS 3  --  3  --   --   --   --   --   --   --    < > = values in this interval not displayed.    Estimated Creatinine Clearance: 47.4 mL/min (by C-G formula based on SCr of 0.93 mg/dL).   Medical History: Past Medical History:  Diagnosis Date  . Adenomatous polyp    x1  . ALLERGIC RHINITIS 12/20/2006  . Chronic anxiety   . Chronic back pain 04/14/2007   02/2017 [facility] records reviewed - mild lumbar DDD and L SIJ pain s/p ESI latest 10/2016 with good effect  . Diverticulitis   . GERD (gastroesophageal reflux disease)    intermittent, treated with tums  . Hiatal hernia   . Hypothyroidism   . Insomnia   . Osteoporosis 2007   declined prolia  . Perforation of  right tympanic membrane 09/03/2015   Chronic, seen by Dr Janace Hoard ENT   . Personal history of kidney stones    x1  . Postmenopausal atrophic vaginitis 2016   rec estrace cream - [place]  . Recurrent UTI   . Scoliosis   . Severe recurrent depression with psychosis ([place])   . Urinary retention   . Vitamin D deficiency      Assessment:  Pharmacy consulted to dose heparin for this 85 yo female for possible PE. Patient has been short of breath. She hasn't been on an anti-coagulant prior to admission. Baseline CBC is WNL. Family members contemplating SNF versus hospice placement soon. Patient with submassive bilateral PE with left occlusive DVT.  Will adjust rate.  Plan is for 5 days on heparin infusion then transition to eliquis. HL therapeutic  Goal of Therapy:  Heparin level 0.3-0.7 units/ml Monitor platelets by anticoagulation protocol: Yes   Plan:  Cont heparin at 1750 units/hr Check anti-Xa level daily while on heparin. F/U transition to po tx Continue to monitor H&H and platelets.  Isac Sarna, BS [place], [place] Clinical Pharmacist Pager [phone] 10/07/2018 5:38 PM

## 2018-10-07 NOTE — Progress Notes (Signed)
Daily Progress Note   Patient Name: Jamie Pitts       Date: 10/07/2018 DOB: Aug 05, 1933  Age: 85 y.o. MRN#: [id number] Attending Physician: Jamie Dike, MD Primary Care Physician: Jamie Bush, MD Admit Date: 10/04/2018  Reason for Consultation/Follow-up: Establishing goals of care  Subjective: Patient awake, alert, oriented. Sitting in recliner this AM. Complains of back pain (chronic). Instructed RN to give prn Tramadol. Patient [place] but able to participate in conversation.   GOC:  F/u with daughter and husband at bedside about plan of care. After further family discussion, they have made decision to take [place] home with home health services. Daughter hopeful she will be able to participate in some therapy at home. If she does not progress with therapy, family plans to transition to hospice services in the near future. Daughter requesting medical equipment--hospital bed, BSC, walker. May need oxygen if unable to wean inpatient. Explained to daughter that because of blood clots and the need for blood thinner, it will at least be one month until [facility] could possibly receive back injection for pain management (discussed with Dr. Roderic Pitts).  Daughter asks about prognosis. Explained functional and nutritional status aiding in prognosis, if she works with therapy and eating more, she may do better for longer. Explained with ongoing decline in functional/nutritional status and sedentary lifestyle, she is higher risk for complications leading to death including recurrent blood clots, bed sores, pneumonia, dehydration/electrolyte imbalance, etc. Daughter confirms understand.   They plan to move [name] into daughter's home in [place]. Jamie Pitts has spoke with Jamie Pitts through [facility] for  outpatient palliative. May have to switch to [facility]. VM left for [facility] RN liaision, Jamie Pitts.   Answered questions for family. PMT contact information given.    Length of Stay: 3  Current Medications: Scheduled Meds:  . Chlorhexidine Gluconate Cloth  6 each Topical Daily  . hydrocortisone cream   Topical BID  . levothyroxine  75 mcg Oral QAC breakfast  . mouth rinse  15 mL Mouth Rinse BID  . risperiDONE  1.5 mg Oral BID  . traZODone  150 mg Oral QHS  . venlafaxine XR  75 mg Oral Daily    Continuous Infusions: . sodium chloride 75 mL/hr at 10/06/18 2344  . heparin 1,750 Units/hr (10/07/18 0909)    PRN Meds: ondansetron **OR** ondansetron (ZOFRAN) IV,  traMADol  Physical Exam Vitals signs and nursing note reviewed.  Constitutional:      General: She is awake.     Appearance: She is ill-appearing.  HENT:     Head: Normocephalic and atraumatic.  Cardiovascular:     Rate and Rhythm: Normal rate.  Pulmonary:     Effort: No tachypnea, accessory muscle usage or respiratory distress.  Skin:    General: Skin is warm and dry.  Neurological:     Mental Status: She is alert and oriented to person, place, and time.     Comments: Forgetful, HOH            Vital Signs: BP 137/82 (BP Location: Left Arm)   Pulse (!) 110   Temp 97.7 F (36.5 C) (Oral)   Resp 16   Ht 5\' 6"  (1.676 m)   Wt 72 kg   LMP 07/15/2012   SpO2 95%   BMI 25.62 kg/m  SpO2: SpO2: 95 % O2 Device: O2 Device: Nasal Cannula O2 Flow Rate: O2 Flow Rate (L/min): 2 L/min  Intake/output summary:   Intake/Output Summary (Last 24 hours) at 10/07/2018 0941 Last data filed at 10/07/2018 0900 Gross per 24 hour  Intake 1003.58 ml  Output 500 ml  Net 503.58 ml   LBM: Last BM Date: 10/07/18 Baseline Weight: Weight: 77.6 kg Most recent weight: Weight: 72 kg       Palliative Assessment/Data: PPS 40%    Flowsheet Rows     Most Recent Value  Intake Tab  Referral Department  Hospitalist   Unit at Time of Referral  ICU  Palliative Care Primary Diagnosis  Pulmonary  Palliative Care Type  New Palliative care  Reason for referral  Clarify Goals of Care  Date first seen by Palliative Care  10/06/18  Clinical Assessment  Palliative Performance Scale Score  40%  Psychosocial & Spiritual Assessment  Palliative Care Outcomes  Patient/Family meeting held?  Yes  Who was at the meeting?  patient, daughter, husband  Palliative Care Outcomes  Clarified goals of care, Counseled regarding hospice, Provided end of life care assistance, Provided psychosocial or spiritual support, ACP counseling assistance      Patient Active Problem List   Diagnosis Date Noted  . Palliative care by specialist   . Goals of care, counseling/discussion   . DNR (do not resuscitate)   . Fall   . Depression, recurrent ([place])   . Weakness   . Multiple pulmonary emboli ([place]) 10/04/2018  . Vitamin B12 deficiency 09/07/2018  . Vitamin D deficiency 09/07/2018  . Bilateral leg pain 08/13/2018  . Psychosis in elderly ([place]) 07/24/2018  . Malnutrition of moderate degree 05/20/2017  . Fecal impaction ([place]) 05/18/2017  . Abdominal pain 05/18/2017  . Long-term current use of opiate analgesic 03/06/2017  . Chronic pain syndrome 03/06/2017  . Nausea 03/04/2017  . Abdominal cramping 05/24/2016  . Perforation of right tympanic membrane 09/03/2015  . COPD (chronic obstructive pulmonary disease) ([place]) 12/07/2014  . Constipation 05/08/2014  . Urinary retention 05/08/2014  . Urinary urgency 04/05/2014  . Chronic cough 03/02/2013  . Dysphagia 11/05/2012  . Drug-induced Parkinsonism ([place]) 07/29/2012  . Insomnia secondary to depression with anxiety 05/22/2012  . Chronic low back pain 04/14/2007  . Hypothyroidism 11/04/2006  . GERD 11/04/2006  . OSTEOPOROSIS 11/04/2006  . Depression, major, recurrent, severe with psychosis ([place]) 08/29/2006    Palliative Care Assessment & Plan   Patient Profile: 85 y.o. female   with past medical history of parkinsonism/depression with psychosis,  UTI, osteoporosis, hypothyroidism, hiatal hernia, GERD, anxiety, chronic back pain admitted on 10/04/2018 with confusion, falls, poor oral intake. Patient lives home wit husband. Patient recently discharged from a psych facility after a 2-week stay. Chronic narcotics for back pain recently discontinued by psych facility as it was felt her hydrocodone was causing auditory hallucinations. In ED, patient found to have bilateral submassive pulmonary embolisms with RV strain on CT scan. Bilateral lower extremity ultrasound with left-sided occlusive DVT. On heparin gtt for 5 days and then possibly transition to Eliquis. MRI head negative for acute findings. Palliative medicine consultation for goals of care.   Assessment: Acute hypoxemic respiratory failure  Submassive bilateral PE's Left occlusive DVT Generalized weakness/FTT Multiple falls Diarrhea Hyponatremia Hx of parkinsonism/depression with psychosis Chronic pain with hx of scoliosis  Recommendations/Plan:  GOC with husband and daughter 8/18. Family confirms patient's wishes for DNR code status and against heroic measures at EOL.  Treat the treatable. Continue current plan of care and medical management per attending.   Husband and daughter have decided to take [place] home with support of home health & attempt physical therapy. Family requesting DME. TOC CM/SW notified. Family requesting outpatient palliative services and understand this can quickly transition to home hospice services if patient further declines once home.    Code Status: DNR   Code Status Orders  (From admission, onward)         Start     Ordered   10/05/18 0325  Do not attempt resuscitation (DNR)  Continuous    Question Answer Comment  In the event of cardiac or respiratory ARREST Do not call a "code blue"   In the event of cardiac or respiratory ARREST Do not perform Intubation, CPR,  defibrillation or ACLS   In the event of cardiac or respiratory ARREST Use medication by any route, position, wound care, and other measures to relive pain and suffering. May use oxygen, suction and manual treatment of airway obstruction as needed for comfort.      10/05/18 0324        Code Status History    Date Active Date Inactive Code Status Order ID Comments User Context   05/18/2017 2238 05/20/2017 1942 Full Code [id number]  Idelle Crouch, MD Inpatient   05/08/2014 0736 05/09/2014 1911 Full Code [id number]  Lavina Hamman, MD Inpatient   03/14/2012 0456 03/15/2012 2001 Full Code [id number]  Schinlever, Clarise Cruz ED   Advance Care Planning Activity       Prognosis:   Guarded  Discharge Planning:  Home with [place] was discussed with patient, daughter, husband, Dr. Roderic Palau, RN  Thank you for allowing the Palliative Medicine Team to assist in the care of this patient.   Time In: 1010 Time Out: 1045 Total Time 35 Prolonged Time Billed no      Greater than 50%  of this time was spent counseling and coordinating care related to the above assessment and plan.  Ihor Dow, DNP, FNP-C Palliative Medicine Team  Phone: [phone] Fax: [phone]  Please contact Palliative Medicine Team phone at [phone] for questions and concerns.

## 2018-10-07 NOTE — Progress Notes (Signed)
Physical Therapy Treatment Patient Details Name: Jamie Pitts MRN: [id number] DOB: 01-Jun-1933 Today's Date: 10/07/2018    History of Present Illness Jamie Pitts is a 85 y.o. female with medical history significant for Parkinson's disease, hypothyroidism depression with psychosis and COPD, was brought to the ED with complaints of generalized weakness, increased confusion and falls over the past several months.  Patient tells me she has difficulty breathing, but denies chest pain. Patient tells me she has not been eating much over the past several days, due to poor appetite.  She denies vomiting or loose stools.  Patient tells me she feels weak all over.Patient lives with her husband.  Patient's daughter called EMS due to gradual decline over the past several months.I talked to patient's daughter, she reports that patient was discharged from the psych facility after a 2-week stay on 23 July.  She has chronic back pain and her chronic narcotics were recently discontinued at the psych facility as it was thought that her hydrocodone was causing auditory hallucinations. So patient has been lying in bed most of the day.  Daughter also noticed difficulty breathing over the past week at rest and significantly worse with minimal exertion.  Daughter reports multiple falls, at least 3 falls within 1 week.  Daughter reports 3-5 loose stools daily over the past 2 weeks.    PT Comments    Patient agreeable for therapy and demonstrates slow labored movement for sitting up with difficulty for propping up on elbows to hands and moving legs during bed mobility.  Patient incontinent of stool and transferred to [facility] with slow labored movement to attempt bowel movement, later able to take a few steps to transfer back to bedside, then to chair, limited mostly due to c/o fatigue and poor standing balance.  Patient tolerated sitting up in chair after therapy - RN notified.  Patient will benefit from continued physical  therapy in hospital and recommended venue below to increase strength, balance, endurance for safe ADLs and gait.   Follow Up Recommendations  SNF     Equipment Recommendations  None recommended by PT    Recommendations for Other Services       Precautions / Restrictions Precautions Precautions: Fall Restrictions Weight Bearing Restrictions: No    Mobility  Bed Mobility Overal bed mobility: Needs Assistance Bed Mobility: Supine to Sit     Supine to sit: Mod assist     General bed mobility comments: increased time, labored movement, difficulty moving BLE due to weakness  Transfers Overall transfer level: Needs assistance Equipment used: Rolling walker (2 wheeled) Transfers: Sit to/from [facility] Sit to Stand: Min assist;Mod assist Stand pivot transfers: Mod assist       General transfer comment: increased time, labored movement  Ambulation/Gait Ambulation/Gait assistance: Mod assist Gait Distance (Feet): 6 Feet Assistive device: Rolling walker (2 wheeled) Gait Pattern/deviations: Decreased step length - right;Decreased step length - left;Decreased stride length Gait velocity: slow   General Gait Details: slow unsteady labored steps, unsafe to ambulate away from bedside due to weakness and c/o fatigue   Stairs             Wheelchair Mobility    Modified Rankin (Stroke Patients Only)       Balance Overall balance assessment: Needs assistance Sitting-balance support: Feet supported;No upper extremity supported Sitting balance-Leahy Scale: Fair Sitting balance - Comments: seated at bedside   Standing balance support: During functional activity;Bilateral upper extremity supported Standing balance-Leahy Scale: Poor Standing balance comment: fair/poor using RW  Cognition Arousal/Alertness: Awake/alert Behavior During Therapy: WFL for tasks assessed/performed Overall Cognitive Status: Within  Functional Limits for tasks assessed                                        Exercises General Exercises - Lower Extremity Long Arc Quad: Seated;AROM;Strengthening;Both;10 reps Hip Flexion/Marching: Seated;AROM;Strengthening;Both;10 reps Toe Raises: Seated;Strengthening;Both;10 reps;AROM Heel Raises: Seated;Strengthening;Both;10 reps;AROM    General Comments        Pertinent Vitals/Pain Pain Assessment: Faces Faces Pain Scale: Hurts little more Pain Location: low back Pain Descriptors / Indicators: Sore;Discomfort Pain Intervention(s): Limited activity within patient's tolerance;Monitored during session    Home Living                      Prior Function            PT Goals (current goals can now be found in the care plan section) Acute Rehab PT Goals Patient Stated Goal: return home with family to assist PT Goal Formulation: With patient Time For Goal Achievement: 10/20/18 Potential to Achieve Goals: Good    Frequency    Min 3X/week      PT Plan Current plan remains appropriate    Co-evaluation              AM-PAC PT "6 Clicks" Mobility   Outcome Measure  Help needed turning from your back to your side while in a flat bed without using bedrails?: A Lot Help needed moving from lying on your back to sitting on the side of a flat bed without using bedrails?: A Lot Help needed moving to and from a bed to a chair (including a wheelchair)?: A Lot Help needed standing up from a chair using your arms (e.g., wheelchair or bedside chair)?: A Lot Help needed to walk in hospital room?: A Lot Help needed climbing 3-5 steps with a railing? : Total 6 Click Score: 11    End of Session Equipment Utilized During Treatment: Oxygen Activity Tolerance: Patient tolerated treatment well Patient left: in chair;with call bell/phone within reach;with chair alarm set Nurse Communication: Mobility status PT Visit Diagnosis: Unsteadiness on feet  (R26.81);Other abnormalities of gait and mobility (R26.89);Muscle weakness (generalized) (M62.81)     Time: 7672-0947 PT Time Calculation (min) (ACUTE ONLY): 34 min  Charges:  $Therapeutic Exercise: 8-22 mins $Therapeutic Activity: 8-22 mins                     10:35 AM, 10/07/18 Lonell Grandchild, MPT Physical Therapist with [facility] 336 [phone] office [phone] mobile phone

## 2018-10-07 NOTE — Progress Notes (Signed)
PROGRESS NOTE    Jamie Pitts  FIE:[id number] DOB: 05/26/1933 DOA: 10/04/2018 PCP: Ria [facility], MD   Brief Narrative:  Per HPI: Jamie Pitts a 85 y.o.femalewith medical history significant forParkinson's disease, hypothyroidism depression with psychosis and COPD, was brought to the ED with complaints of generalized weakness, increased confusion and falls over the past several months. Patient tells me she has difficulty breathing, butdenies chest pain. Patient tells me she has not been eating much over the past several days,due to poor appetite. She denies vomiting or loose stools. Patient tells me she feels weak all over. Patient lives with her husband. Patient's daughter called EMS due to gradual decline over the past several months. I talked to patient's daughter, she reports that patient was discharged from the psych facility after a 2-week stay on 23 July. She has chronic back pain and her chronic narcotics wererecently discontinued at the psych facility as it was thought that her hydrocodone was causing auditory hallucinations. Sopatient has been lying in bed most of the day. Daughter also noticed difficulty breathing over the past week at rest and significantly worse with minimal exertion.Daughter reports multiple falls, at least3 falls within 1 week.Daughter reports 3-5 loose stools daily over the past 2 weeks.  Patient was admitted with bilateral submassive pulmonary embolism with demonstrated RV strain on CT scan.  2D echocardiogram currently pending.  Bilateral lower extremity ultrasound with left-sided occlusive DVT noted and no DVT to the right lower extremity.  Brain MRI performed for generalized weakness with no acute findings.  PT evaluation reveals need for SNF.   Assessment & Plan:   Active Problems:   Multiple pulmonary emboli (HCC)   Palliative care by specialist   Goals of care, counseling/discussion   DNR (do not resuscitate)   Fall  Depression, recurrent ([place])   Weakness   Acute hypoxemic respiratory failure secondary to submassive bilateral PE with left occlusive DVT -This is all likely secondary to recent immobility with gradual decline in functional status over several months -Okay for management in stepdown unit on heparin drip with stable vital signs noted; will need to remain on heparin drip for total 5 days, at which point patient could be transitioned to Eliquis per recommendations from Dr. Luan Pulling -2D echocardiogram with LVEF 60-65% -We will check Doppler pulses of left and right lower extremities routinely as needed, however clinically has palpable pulses currently.  Generalized weakness/failure to thrive over several months -Appreciate PT evaluation with recommendations for SNF -Palliative following and family members debating between SNF and hospice -Would like to pursue outpatient lumbar epidural to see if patient might be able to participate in rehab-this was discussed with palliative care  Multiple falls- multifactorial with fragility and deconditioning -No evidence of trauma or pelvic fracture noted -Fall precautions and PT evaluation  Diarrhea -Stool C. difficile negative -Continue hydration -GI panel negative -We will use Imodium for diarrhea and Bentyl for abdominal cramping.  Hyponatremia-likely secondary to poor p.o. intake -Continue normal saline and trend with repeat BMP in a.m.  Hypothyroidism -Patient is chronically on Synthroid 75 mcg daily -TSH noted to be 22.12, will increase Synthroid to 100 mcg daily  Hypocalcemia -Slowly improving. -PTH and ionized calcium normal  COPD-stable -Duo nebs as needed for wheezing  History of parkinsonism/depression with psychosis -Continue home risperidone, venlafaxine and trazodone -Holding lamotrigine as it does not appear patient has been taking this at home  Chronic pain -Patient has significant pain to the point that it is  affecting her level of functioning.  She appears quite uncomfortable.  Previously, she was taken off hydrocodone due to hallucinations.  Her husband reports that she was taking 3 to 4 tablets a day.  Currently, she is on tramadol, which does not appear to be adequately controlling her pain.  After discussing risks and benefits, family has agreed to restart hydrocodone at lower dose, 2 tablets a day.  DVT prophylaxis: Heparin drip Code Status: DNR Family Communication:  Discussed with daughter and husband at the bedside Disposition Plan: Continue treatment with IV heparin drip.  Appreciate palliative care consultation.  Considering skilled nursing facility placement vs.  Home with hospice services   Consultants:   Palliative Care  Procedures:   None  Antimicrobials:   None   Subjective: Patient continues to complain of back pain.  She has continued frequent stools.  She feels nauseous.  Objective: Vitals:   10/06/18 1600 10/06/18 2109 10/07/18 0546 10/07/18 0829  BP: (!) 115/57 132/66 137/82   Pulse: (!) 101 (!) 108 (!) 110   Resp: 14 18 16    Temp:  97.8 F (36.6 C) 97.7 F (36.5 C)   TempSrc:  Oral Oral   SpO2: 92% 94% 95% 95%  Weight:      Height:        Intake/Output Summary (Last 24 hours) at 10/07/2018 1753 Last data filed at 10/07/2018 1300 Gross per 24 hour  Intake 180 ml  Output 500 ml  Net -320 ml   Filed Weights   10/04/18 1435 10/05/18 1123 10/06/18 0500  Weight: 77.6 kg 76.7 kg 72 kg    Examination:  General exam: Alert, awake, appears chronically ill and uncomfortable Respiratory system: Clear to auscultation. Respiratory effort normal. Cardiovascular system:RRR. No murmurs, rubs, gallops. Gastrointestinal system: Abdomen is nondistended, soft and nontender. No organomegaly or masses felt. Normal bowel sounds heard. Central nervous system:  No focal neurological deficits. Extremities: 1+ edema bilaterally Skin: No rashes, lesions or ulcers  Psychiatry: Confused at times, hard of hearing     Data Reviewed: I have personally reviewed following labs and imaging studies  CBC: Recent Labs  Lab 10/04/18 1540 10/05/18 0531 10/06/18 0157 10/07/18 0417  WBC 14.2* 13.0* 14.9* 14.9*  NEUTROABS 11.1*  --   --   --   HGB 11.1* 10.3* 10.0* 9.6*  HCT 36.2 33.7* 32.7* 30.9*  MCV 86.0 87.3 88.4 86.3  PLT 446* 392 423* 211*   Basic Metabolic Panel: Recent Labs  Lab 10/04/18 1540 10/05/18 0531 10/06/18 0157 10/06/18 1532 10/07/18 0417  NA 126* 129* 127*  --  131*  K 4.0 4.3 4.1  --  3.9  CL 92* 96* 96*  --  101  CO2 23 24 20*  --  20*  GLUCOSE 84 90 99  --  103*  BUN 12 14 17   --  15  CREATININE 1.03* 1.00 0.96  --  0.93  CALCIUM 8.5* 8.5* 7.7* 7.9* 8.1*   GFR: Estimated Creatinine Clearance: 47.4 mL/min (by C-G formula based on SCr of 0.93 mg/dL). Liver Function Tests: Recent Labs  Lab 10/04/18 1540  AST 14*  ALT 17  ALKPHOS 98  BILITOT 0.6  PROT 6.4*  ALBUMIN 2.3*   No results for input(s): LIPASE, AMYLASE in the last 168 hours. No results for input(s): AMMONIA in the last 168 hours. Coagulation Profile: No results for input(s): INR, PROTIME in the last 168 hours. Cardiac Enzymes: No results for input(s): CKTOTAL, CKMB, CKMBINDEX, TROPONINI in the last 168 hours. BNP (last 3 results) No results  for input(s): PROBNP in the last 8760 hours. HbA1C: No results for input(s): HGBA1C in the last 72 hours. CBG: No results for input(s): GLUCAP in the last 168 hours. Lipid Profile: No results for input(s): CHOL, HDL, LDLCALC, TRIG, CHOLHDL, LDLDIRECT in the last 72 hours. Thyroid Function Tests: Recent Labs    10/05/18 1211 10/06/18 1532  TSH 22.127*  --   FREET4  --  1.26*   Anemia Panel: No results for input(s): VITAMINB12, FOLATE, FERRITIN, TIBC, IRON, RETICCTPCT in the last 72 hours. Sepsis Labs: No results for input(s): PROCALCITON, LATICACIDVEN in the last 168 hours.  Recent Results (from the  past 240 hour(s))  SARS Coronavirus 2 [facility] order, Performed in [facility] hospital lab) Nasopharyngeal Nasopharyngeal Swab     Status: None   Collection Time: 10/04/18  4:26 PM   Specimen: Nasopharyngeal Swab  Result Value Ref Range Status   SARS Coronavirus 2 NEGATIVE NEGATIVE Final    Comment: (NOTE) If result is NEGATIVE SARS-CoV-2 target nucleic acids are NOT DETECTED. The SARS-CoV-2 RNA is generally detectable in upper and lower  respiratory specimens during the acute phase of infection. The lowest  concentration of SARS-CoV-2 viral copies this assay can detect is 250  copies / mL. A negative result does not preclude SARS-CoV-2 infection  and should not be used as the sole basis for treatment or other  patient management decisions.  A negative result may occur with  improper specimen collection / handling, submission of specimen other  than nasopharyngeal swab, presence of viral mutation(s) within the  areas targeted by this assay, and inadequate number of viral copies  (<250 copies / mL). A negative result must be combined with clinical  observations, patient history, and epidemiological information. If result is POSITIVE SARS-CoV-2 target nucleic acids are DETECTED. The SARS-CoV-2 RNA is generally detectable in upper and lower  respiratory specimens dur ing the acute phase of infection.  Positive  results are indicative of active infection with SARS-CoV-2.  Clinical  correlation with patient history and other diagnostic information is  necessary to determine patient infection status.  Positive results do  not rule out bacterial infection or co-infection with other viruses. If result is PRESUMPTIVE POSTIVE SARS-CoV-2 nucleic acids MAY BE PRESENT.   A presumptive positive result was obtained on the submitted specimen  and confirmed on repeat testing.  While 2019 novel coronavirus  (SARS-CoV-2) nucleic acids may be present in the submitted sample  additional confirmatory  testing may be necessary for epidemiological  and / or clinical management purposes  to differentiate between  SARS-CoV-2 and other Sarbecovirus currently known to infect humans.  If clinically indicated additional testing with an alternate test  methodology [phone]) is advised. The SARS-CoV-2 RNA is generally  detectable in upper and lower respiratory sp ecimens during the acute  phase of infection. The expected result is Negative. Fact Sheet for Patients:  StrictlyIdeas.no Fact Sheet for Healthcare Providers: BankingDealers.co.za This test is not yet approved or cleared by the [place] FDA and has been authorized for detection and/or diagnosis of SARS-CoV-2 by FDA under an Emergency Use Authorization (EUA).  This EUA will remain in effect (meaning this test can be used) for the duration of the COVID-19 declaration under Section 564(b)(1) of the Act, 21 U.S.C. section 360bbb-3(b)(1), unless the authorization is terminated or revoked sooner. Performed at [facility], [address], [place], [place] [address]   Urine Culture     Status: None   Collection Time: 10/04/18  4:41 PM  Specimen: Urine, Clean Catch  Result Value Ref Range Status   Specimen Description   Final    URINE, CLEAN CATCH Performed at [facility], [address], [place], [place] [address]    Special Requests   Final    NONE Performed at [facility], [address], [place], [place] [address]    Culture   Final    NO GROWTH Performed at [facility], [place] [address] [place], [place] [address]    Report Status 10/06/2018 FINAL  Final  MRSA PCR Screening     Status: None   Collection Time: 10/05/18 11:35 AM   Specimen: Nasal Mucosa; Nasopharyngeal  Result Value Ref Range Status   MRSA by PCR NEGATIVE NEGATIVE Final    Comment:        The GeneXpert MRSA Assay (FDA approved for NASAL specimens only), is one component of a comprehensive MRSA  colonization surveillance program. It is not intended to diagnose MRSA infection nor to guide or monitor treatment for MRSA infections. Performed at [facility], [address], [place], [place] [address]   Gastrointestinal Panel by PCR , Stool     Status: None   Collection Time: 10/05/18  8:00 PM   Specimen: Stool  Result Value Ref Range Status   Campylobacter species NOT DETECTED NOT DETECTED Final   Plesimonas shigelloides NOT DETECTED NOT DETECTED Final   Salmonella species NOT DETECTED NOT DETECTED Final   Yersinia enterocolitica NOT DETECTED NOT DETECTED Final   Vibrio species NOT DETECTED NOT DETECTED Final   Vibrio cholerae NOT DETECTED NOT DETECTED Final   Enteroaggregative E coli (EAEC) NOT DETECTED NOT DETECTED Final   Enteropathogenic E coli (EPEC) NOT DETECTED NOT DETECTED Final   Enterotoxigenic E coli (ETEC) NOT DETECTED NOT DETECTED Final   Shiga like toxin producing E coli (STEC) NOT DETECTED NOT DETECTED Final   Shigella/Enteroinvasive E coli (EIEC) NOT DETECTED NOT DETECTED Final   Cryptosporidium NOT DETECTED NOT DETECTED Final   Cyclospora cayetanensis NOT DETECTED NOT DETECTED Final   Entamoeba histolytica NOT DETECTED NOT DETECTED Final   Giardia lamblia NOT DETECTED NOT DETECTED Final   Adenovirus F40/41 NOT DETECTED NOT DETECTED Final   Astrovirus NOT DETECTED NOT DETECTED Final   Norovirus GI/GII NOT DETECTED NOT DETECTED Final   Rotavirus A NOT DETECTED NOT DETECTED Final   Sapovirus (I, II, IV, and V) NOT DETECTED NOT DETECTED Final    Comment: Performed at [facility], [address] [place], [place] [address]  C difficile quick scan w PCR reflex     Status: None   Collection Time: 10/05/18 10:39 PM   Specimen: STOOL  Result Value Ref Range Status   C Diff antigen NEGATIVE NEGATIVE Final   C Diff toxin NEGATIVE NEGATIVE Final   C Diff interpretation No C. difficile detected.  Final    Comment: Performed at [facility], [address] [place], [place] [address]         Radiology Studies: No results found.      Scheduled Meds: . Chlorhexidine Gluconate Cloth  6 each Topical Daily  . dicyclomine  10 mg Oral TID AC & HS  . hydrocortisone cream   Topical BID  . levothyroxine  75 mcg Oral QAC breakfast  . loperamide  2 mg Oral Q6H  . mouth rinse  15 mL Mouth Rinse BID  . risperiDONE  1.5 mg Oral BID  . traZODone  150 mg Oral QHS  . venlafaxine XR  75 mg Oral Daily   Continuous Infusions: . sodium chloride 75 mL/hr at 10/07/18 1329  . heparin 1,750 Units/hr (10/07/18 1625)     LOS: 3 days    Time spent: 30 minutes    Kathie Dike, MD [facility]  If 7PM-7AM, please contact night-coverage [email or web address]  10/07/2018, 5:53 PM

## 2018-10-07 NOTE — Progress Notes (Deleted)
Patient more cooperative today compared to yesterday. Agreeable to VS, insulin and PO meds. Ambulated with therapy and cooperatively sit in chair. Chair alarm in place, call bell and hydration within reach, will continue to monitor.

## 2018-10-08 LAB — CBC
HCT: 30.6 % — ABNORMAL LOW (ref 36.0–46.0)
Hemoglobin: 9.2 g/dL — ABNORMAL LOW (ref 12.0–15.0)
MCH: 26.7 pg (ref 26.0–34.0)
MCHC: 30.1 g/dL (ref 30.0–36.0)
MCV: 89 fL (ref 80.0–100.0)
Platelets: 409 10*3/uL — ABNORMAL HIGH (ref 150–400)
RBC: 3.44 MIL/uL — ABNORMAL LOW (ref 3.87–5.11)
RDW: 14.3 % (ref 11.5–15.5)
WBC: 14.7 10*3/uL — ABNORMAL HIGH (ref 4.0–10.5)
nRBC: 0 % (ref 0.0–0.2)

## 2018-10-08 LAB — BASIC METABOLIC PANEL
Anion gap: 8 (ref 5–15)
BUN: 13 mg/dL (ref 8–23)
CO2: 19 mmol/L — ABNORMAL LOW (ref 22–32)
Calcium: 7.8 mg/dL — ABNORMAL LOW (ref 8.9–10.3)
Chloride: 104 mmol/L (ref 98–111)
Creatinine, Ser: 0.8 mg/dL (ref 0.44–1.00)
GFR calc Af Amer: >60 mL/min (ref >60)
GFR calc non Af Amer: >60 mL/min (ref >60)
Glucose, Bld: 85 mg/dL (ref 70–99)
Potassium: 3.9 mmol/L (ref 3.5–5.1)
Sodium: 131 mmol/L — ABNORMAL LOW (ref 135–145)

## 2018-10-08 LAB — HEPARIN LEVEL (UNFRACTIONATED): Heparin Unfractionated: 0.35 IU/mL (ref 0.30–0.70)

## 2018-10-08 MED ORDER — HYDROCODONE-ACETAMINOPHEN 5-325 MG PO TABS
1.0000 | ORAL_TABLET | ORAL | Status: AC | PRN
  Administered 2018-10-08 – 2018-10-09 (×3): 1 via ORAL
  Filled 2018-10-08 (×3): qty 1

## 2018-10-08 NOTE — Progress Notes (Signed)
[place] for Heparin  Indication: confirmed DVT, possible PE  Allergies  Allergen Reactions  . Amoxicillin Hives    Sores in mouth  . Buprenorphine Hcl Itching    Can tolerate if necessary Can tolerate if necessary Can tolerate if necessary  . Morphine Itching    Can tolerate if necessary  . Sulfa Antibiotics Hives    Sores in mouth Sores in mouth   . Valium [Diazepam] Itching    Can tolerate if necessary    Patient Measurements: Height: 5\' 6"  (167.6 cm) Weight: 158 lb 11.7 oz (72 kg) IBW/kg (Calculated) : 59.3  HEPARIN DW (KG): 74.9  Vital Signs: Temp: 99.5 F (37.5 C) (08/20 0551) Temp Source: Oral (08/20 0551) BP: 145/71 (08/20 0551) Pulse Rate: 110 (08/20 0551)  Labs: Recent Labs    10/06/18 0157  10/07/18 0417 10/07/18 1633 10/08/18 0507  HGB 10.0*  --  9.6*  --  9.2*  HCT 32.7*  --  30.9*  --  30.6*  PLT 423*  --  451*  --  409*  HEPARINUNFRC 0.40   < > 0.24* 0.45 0.35  CREATININE 0.96  --  0.93  --  0.80   < > = values in this interval not displayed.    Estimated Creatinine Clearance: 55.1 mL/min (by C-G formula based on SCr of 0.8 mg/dL).   Medical History: Past Medical History:  Diagnosis Date  . Adenomatous polyp    x1  . ALLERGIC RHINITIS 12/20/2006  . Chronic anxiety   . Chronic back pain 04/14/2007   02/2017 [facility] records reviewed - mild lumbar DDD and L SIJ pain s/p ESI latest 10/2016 with good effect  . Diverticulitis   . GERD (gastroesophageal reflux disease)    intermittent, treated with tums  . Hiatal hernia   . Hypothyroidism   . Insomnia   . Osteoporosis 2007   declined prolia  . Perforation of right tympanic membrane 09/03/2015   Chronic, seen by Dr Janace Hoard ENT   . Personal history of kidney stones    x1  . Postmenopausal atrophic vaginitis 2016   rec estrace cream - [place]  . Recurrent UTI   . Scoliosis   . Severe recurrent depression with psychosis ([place])   . Urinary retention   .  Vitamin D deficiency      Assessment:  Pharmacy consulted to dose heparin for this 85 yo female for possible PE. Patient has been short of breath. She hasn't been on an anti-coagulant prior to admission. Baseline CBC is WNL. Family members contemplating SNF versus hospice placement soon.  Patient with submassive bilateral PE with left occlusive DVT.  Plan is for 5 days on heparin infusion then transition to eliquis. HL therapeutic at 0.35  Goal of Therapy:  Heparin level 0.3-0.7 units/ml Monitor platelets by anticoagulation protocol: Yes   Plan:  Continue heparin at 1750 units/hr Check anti-Xa level daily while on heparin. F/U transition to po tx Continue to monitor H&H and platelets.  Margot Ables, PharmD Clinical Pharmacist 10/08/2018 7:50 AM

## 2018-10-08 NOTE — Progress Notes (Signed)
Daily Progress Note   Patient Name: Jamie Pitts       Date: 10/08/2018 DOB: 1933-05-08  Age: 85 y.o. MRN#: [id number] Attending Physician: Kathie Dike, MD Primary Care Physician: Ria Bush, MD Admit Date: 10/04/2018  Reason for Consultation/Follow-up: Establishing goals of care  Subjective: Patient awake, pleasantly confused this AM. C/o 10/10 back pain. Instructed Rn to give prn hydrocodone. Patient reports if she doesn't get out of the hospital today, she will die in the hospital. (She also told family this). Patient's oxygen was off. Replaced oxygen. Emotional support provided and reassured patient of plan to get her home soon, hopefully tomorrow. Reassured patient that plan is for medical equipment to be delivered to her daughter's house and she will discharge there.   GOC:  Husband and daughter at bedside. Discussed plan of care including outpatient home health physical therapy and [facility] outpatient palliative. Liaison dropped off paperwork for family this morning. Daughter is waiting to hear back about medical equipment. Husband and daughter also planning to hire additional caregiver support in the home.   Answered questions and concerns. Family has PMT contact information.   Length of Stay: 4  Current Medications: Scheduled Meds:  . Chlorhexidine Gluconate Cloth  6 each Topical Daily  . dicyclomine  10 mg Oral TID AC & HS  . hydrocortisone cream   Topical BID  . levothyroxine  100 mcg Oral Q0600  . loperamide  2 mg Oral Q6H  . mouth rinse  15 mL Mouth Rinse BID  . risperiDONE  1.5 mg Oral BID  . traZODone  150 mg Oral QHS  . venlafaxine XR  75 mg Oral Daily    Continuous Infusions: . sodium chloride 75 mL/hr at 10/08/18 0224  . heparin 1,750 Units/hr  (10/08/18 0829)    PRN Meds: HYDROcodone-acetaminophen, ondansetron **OR** ondansetron (ZOFRAN) IV  Physical Exam Vitals signs and nursing note reviewed.  Constitutional:      General: She is awake.     Appearance: She is ill-appearing.  HENT:     Head: Normocephalic and atraumatic.  Cardiovascular:     Rate and Rhythm: Normal rate.  Pulmonary:     Effort: No tachypnea, accessory muscle usage or respiratory distress.  Skin:    General: Skin is warm and dry.  Neurological:     Mental Status: She  is alert.     Comments: Oriented to person/place, forgetful, HOH            Vital Signs: BP (!) 145/71 (BP Location: Left Arm)   Pulse (!) 110   Temp 99.5 F (37.5 C) (Oral)   Resp 16   Ht 5\' 6"  (1.676 m)   Wt 72 kg   LMP 07/15/2012   SpO2 92%   BMI 25.62 kg/m  SpO2: SpO2: 92 % O2 Device: O2 Device: Room Air O2 Flow Rate: O2 Flow Rate (L/min): 2 L/min  Intake/output summary:   Intake/Output Summary (Last 24 hours) at 10/08/2018 1154 Last data filed at 10/08/2018 0900 Gross per 24 hour  Intake 240 ml  Output -  Net 240 ml   LBM: Last BM Date: 10/06/18 Baseline Weight: Weight: 77.6 kg Most recent weight: Weight: 72 kg       Palliative Assessment/Data: PPS 40%    Flowsheet Rows     Most Recent Value  Intake Tab  Referral Department  Hospitalist  Unit at Time of Referral  ICU  Palliative Care Primary Diagnosis  Pulmonary  Palliative Care Type  New Palliative care  Reason for referral  Clarify Goals of Care  Date first seen by Palliative Care  10/06/18  Clinical Assessment  Palliative Performance Scale Score  40%  Psychosocial & Spiritual Assessment  Palliative Care Outcomes  Patient/Family meeting held?  Yes  Who was at the meeting?  patient, daughter, husband  Palliative Care Outcomes  Clarified goals of care, Counseled regarding hospice, Provided end of life care assistance, Provided psychosocial or spiritual support, ACP counseling assistance       Patient Active Problem List   Diagnosis Date Noted  . Palliative care by specialist   . Goals of care, counseling/discussion   . DNR (do not resuscitate)   . Fall   . Depression, recurrent ([place])   . Weakness   . Multiple pulmonary emboli ([place]) 10/04/2018  . Vitamin B12 deficiency 09/07/2018  . Vitamin D deficiency 09/07/2018  . Bilateral leg pain 08/13/2018  . Psychosis in elderly ([place]) 07/24/2018  . Malnutrition of moderate degree 05/20/2017  . Fecal impaction ([place]) 05/18/2017  . Abdominal pain 05/18/2017  . Long-term current use of opiate analgesic 03/06/2017  . Chronic pain syndrome 03/06/2017  . Nausea 03/04/2017  . Abdominal cramping 05/24/2016  . Perforation of right tympanic membrane 09/03/2015  . COPD (chronic obstructive pulmonary disease) ([place]) 12/07/2014  . Constipation 05/08/2014  . Urinary retention 05/08/2014  . Urinary urgency 04/05/2014  . Chronic cough 03/02/2013  . Dysphagia 11/05/2012  . Drug-induced Parkinsonism ([place]) 07/29/2012  . Insomnia secondary to depression with anxiety 05/22/2012  . Chronic low back pain 04/14/2007  . Hypothyroidism 11/04/2006  . GERD 11/04/2006  . OSTEOPOROSIS 11/04/2006  . Depression, major, recurrent, severe with psychosis ([place]) 08/29/2006    Palliative Care Assessment & Plan   Patient Profile: 85 y.o. female  with past medical history of parkinsonism/depression with psychosis, UTI, osteoporosis, hypothyroidism, hiatal hernia, GERD, anxiety, chronic back pain admitted on 10/04/2018 with confusion, falls, poor oral intake. Patient lives home wit husband. Patient recently discharged from a psych facility after a 2-week stay. Chronic narcotics for back pain recently discontinued by psych facility as it was felt her hydrocodone was causing auditory hallucinations. In ED, patient found to have bilateral submassive pulmonary embolisms with RV strain on CT scan. Bilateral lower extremity ultrasound with left-sided occlusive DVT. On  heparin gtt for 5 days and then possibly transition  to Eliquis. MRI head negative for acute findings. Palliative medicine consultation for goals of care.   Assessment: Acute hypoxemic respiratory failure  Submassive bilateral PE's Left occlusive DVT Generalized weakness/FTT Multiple falls Diarrhea Hyponatremia Hx of parkinsonism/depression with psychosis Chronic pain with hx of scoliosis  Recommendations/Plan:  GOC with husband and daughter 8/18. Family confirms patient's wishes for DNR code status and against heroic measures at EOL.  Treat the treatable. Continue current plan of care and medical management per attending.   Husband and daughter have decided to take [place] home with support of home health & attempt physical therapy. Family requesting DME. TOC CM/SW notified. Family requesting outpatient palliative services and understand this can quickly transition to home hospice services if patient further declines once home.   [facility] outpatient palliative following.  After family/attending discussion, low-dose hydrocodone restarted for pain management.    Code Status: DNR   Code Status Orders  (From admission, onward)         Start     Ordered   10/05/18 0325  Do not attempt resuscitation (DNR)  Continuous    Question Answer Comment  In the event of cardiac or respiratory ARREST Do not call a "code blue"   In the event of cardiac or respiratory ARREST Do not perform Intubation, CPR, defibrillation or ACLS   In the event of cardiac or respiratory ARREST Use medication by any route, position, wound care, and other measures to relive pain and suffering. May use oxygen, suction and manual treatment of airway obstruction as needed for comfort.      10/05/18 0324        Code Status History    Date Active Date Inactive Code Status Order ID Comments User Context   05/18/2017 2238 05/20/2017 1942 Full Code [id number]  Idelle Crouch, MD Inpatient   05/08/2014 0736  05/09/2014 1911 Full Code [id number]  Lavina Hamman, MD Inpatient   03/14/2012 0456 03/15/2012 2001 Full Code [id number]  [place], [facility]   Advance Care Planning Activity       Prognosis:   Guarded to poor   Discharge Planning:   Home with Home Health: outpatient palliative  Care plan was discussed with patient, daughter, husband, Dr. Roderic Palau, RN  Thank you for allowing the Palliative Medicine Team to assist in the care of this patient.   Time In: 1130 Time Out: 1155 Total Time 25 Prolonged Time Billed no      Greater than 50%  of this time was spent counseling and coordinating care related to the above assessment and plan.  Ihor Dow, DNP, FNP-C Palliative Medicine Team  Phone: [phone] Fax: [phone]  Please contact Palliative Medicine Team phone at [phone] for questions and concerns.

## 2018-10-08 NOTE — Progress Notes (Signed)
PROGRESS NOTE    Jamie Pitts  AOZ:[id number] DOB: 26-Feb-1933 DOA: 10/04/2018 PCP: Ria Bush, MD   Brief Narrative:  Per HPI: Jamie Pitts a 85 y.o.femalewith medical history significant forParkinson's disease, hypothyroidism depression with psychosis and COPD, was brought to the ED with complaints of generalized weakness, increased confusion and falls over the past several months. Patient tells me she has difficulty breathing, butdenies chest pain. Patient tells me she has not been eating much over the past several days,due to poor appetite. She denies vomiting or loose stools. Patient tells me she feels weak all over. Patient lives with her husband. Patient's daughter called EMS due to gradual decline over the past several months. I talked to patient's daughter, she reports that patient was discharged from the psych facility after a 2-week stay on 23 July. She has chronic back pain and her chronic narcotics wererecently discontinued at the psych facility as it was thought that her hydrocodone was causing auditory hallucinations. Sopatient has been lying in bed most of the day. Daughter also noticed difficulty breathing over the past week at rest and significantly worse with minimal exertion.Daughter reports multiple falls, at least3 falls within 1 week.Daughter reports 3-5 loose stools daily over the past 2 weeks.  Patient was admitted with bilateral submassive pulmonary embolism with demonstrated RV strain on CT scan.  2D echocardiogram currently pending.  Bilateral lower extremity ultrasound with left-sided occlusive DVT noted and no DVT to the right lower extremity.  Brain MRI performed for generalized weakness with no acute findings.  PT evaluation reveals need for SNF.   Assessment & Plan:   Active Problems:   Multiple pulmonary emboli (HCC)   Palliative care by specialist   Goals of care, counseling/discussion   DNR (do not resuscitate)   Fall  Depression, recurrent ([place])   Weakness   Acute hypoxemic respiratory failure secondary to submassive bilateral PE with left occlusive DVT -This is all likely secondary to recent immobility with gradual decline in functional status over several months -Okay for management in stepdown unit on heparin drip with stable vital signs noted; will need to remain on heparin drip for total 5 days, at which point patient could be transitioned to Eliquis per recommendations from Dr. Luan Pulling -2D echocardiogram with LVEF 60-65% -We will check Doppler pulses of left and right lower extremities routinely as needed, however clinically has palpable pulses currently.  Generalized weakness/failure to thrive over several months -Appreciate PT evaluation with recommendations for SNF -Palliative following and family members debating between SNF and hospice -Would like to pursue outpatient lumbar epidural to see if patient might be able to participate in rehab-this was discussed with palliative care  Multiple falls- multifactorial with fragility and deconditioning -No evidence of trauma or pelvic fracture noted -Fall precautions and PT evaluation  Diarrhea -Stool C. difficile negative -Continue hydration -GI panel negative -We will use Imodium for diarrhea and Bentyl for abdominal cramping. -Diarrhea appears to be improving  Hyponatremia-likely secondary to poor p.o. intake -Continue normal saline and trend with repeat BMP in a.m.  Hypothyroidism -Patient is chronically on Synthroid 75 mcg daily -TSH noted to be 22.12, will increase Synthroid to 100 mcg daily  Hypocalcemia -Slowly improving. -PTH and ionized calcium normal  COPD-stable -Duo nebs as needed for wheezing  History of parkinsonism/depression with psychosis -Continue home risperidone, venlafaxine and trazodone -Holding lamotrigine as it does not appear patient has been taking this at home  Chronic pain -Patient has significant  pain to the point that it is  affecting her level of functioning.  She appears quite uncomfortable.  Previously, she was taken off hydrocodone due to hallucinations.  Her husband reports that she was taking 3 to 4 tablets a day in the past.  In the hospital, she was started on tramadol, which did not appear to be adequately controlling her pain.  After discussing risks and benefits, family has agreed to restart hydrocodone at lower dose, 2 tablets a day.  She appears to be improving with this.  DVT prophylaxis: Heparin drip Code Status: DNR Family Communication:  Discussed with husband at the bedside Disposition Plan: Continue treatment with IV heparin drip.  Appreciate palliative care consultation.  Considering skilled nursing facility placement vs.  Home with hospice services   Consultants:   Palliative Care  Procedures:   None  Antimicrobials:   None   Subjective: Diarrhea is improving with Imodium.  Pain appears to be better controlled.  No nausea or vomiting.  Shortness of breath stable.  Objective: Vitals:   10/07/18 2020 10/07/18 2050 10/08/18 0551 10/08/18 1349  BP:  128/65 (!) 145/71 (!) 126/56  Pulse:  (!) 106 (!) 110 (!) 106  Resp:  16 16 20   Temp:  99.1 F (37.3 C) 99.5 F (37.5 C)   TempSrc:  Oral Oral   SpO2: 93% 97% 92% 90%  Weight:      Height:        Intake/Output Summary (Last 24 hours) at 10/08/2018 1922 Last data filed at 10/08/2018 1700 Gross per 24 hour  Intake 240 ml  Output 600 ml  Net -360 ml   Filed Weights   10/04/18 1435 10/05/18 1123 10/06/18 0500  Weight: 77.6 kg 76.7 kg 72 kg    Examination:  General exam: Alert, awake, oriented x 3 Respiratory system: Clear to auscultation. Respiratory effort normal. Cardiovascular system:RRR. No murmurs, rubs, gallops. Gastrointestinal system: Abdomen is nondistended, soft and nontender. No organomegaly or masses felt. Normal bowel sounds heard. Central nervous system: Alert and oriented. No  focal neurological deficits. Extremities: No C/C/E, +pedal pulses Skin: No rashes, lesions or ulcers Psychiatry: Judgement and insight appear normal. Mood & affect appropriate.    Data Reviewed: I have personally reviewed following labs and imaging studies  CBC: Recent Labs  Lab 10/04/18 1540 10/05/18 0531 10/06/18 0157 10/07/18 0417 10/08/18 0507  WBC 14.2* 13.0* 14.9* 14.9* 14.7*  NEUTROABS 11.1*  --   --   --   --   HGB 11.1* 10.3* 10.0* 9.6* 9.2*  HCT 36.2 33.7* 32.7* 30.9* 30.6*  MCV 86.0 87.3 88.4 86.3 89.0  PLT 446* 392 423* 451* 371*   Basic Metabolic Panel: Recent Labs  Lab 10/04/18 1540 10/05/18 0531 10/06/18 0157 10/06/18 1532 10/07/18 0417 10/08/18 0507  NA 126* 129* 127*  --  131* 131*  K 4.0 4.3 4.1  --  3.9 3.9  CL 92* 96* 96*  --  101 104  CO2 23 24 20*  --  20* 19*  GLUCOSE 84 90 99  --  103* 85  BUN 12 14 17   --  15 13  CREATININE 1.03* 1.00 0.96  --  0.93 0.80  CALCIUM 8.5* 8.5* 7.7* 7.9* 8.1* 7.8*   GFR: Estimated Creatinine Clearance: 55.1 mL/min (by C-G formula based on SCr of 0.8 mg/dL). Liver Function Tests: Recent Labs  Lab 10/04/18 1540  AST 14*  ALT 17  ALKPHOS 98  BILITOT 0.6  PROT 6.4*  ALBUMIN 2.3*   No results for input(s): LIPASE, AMYLASE in the  last 168 hours. No results for input(s): AMMONIA in the last 168 hours. Coagulation Profile: No results for input(s): INR, PROTIME in the last 168 hours. Cardiac Enzymes: No results for input(s): CKTOTAL, CKMB, CKMBINDEX, TROPONINI in the last 168 hours. BNP (last 3 results) No results for input(s): PROBNP in the last 8760 hours. HbA1C: No results for input(s): HGBA1C in the last 72 hours. CBG: No results for input(s): GLUCAP in the last 168 hours. Lipid Profile: No results for input(s): CHOL, HDL, LDLCALC, TRIG, CHOLHDL, LDLDIRECT in the last 72 hours. Thyroid Function Tests: Recent Labs    10/06/18 1532  FREET4 1.26*   Anemia Panel: No results for input(s):  VITAMINB12, FOLATE, FERRITIN, TIBC, IRON, RETICCTPCT in the last 72 hours. Sepsis Labs: No results for input(s): PROCALCITON, LATICACIDVEN in the last 168 hours.  Recent Results (from the past 240 hour(s))  SARS Coronavirus 2 [facility] order, Performed in [facility] hospital lab) Nasopharyngeal Nasopharyngeal Swab     Status: None   Collection Time: 10/04/18  4:26 PM   Specimen: Nasopharyngeal Swab  Result Value Ref Range Status   SARS Coronavirus 2 NEGATIVE NEGATIVE Final    Comment: (NOTE) If result is NEGATIVE SARS-CoV-2 target nucleic acids are NOT DETECTED. The SARS-CoV-2 RNA is generally detectable in upper and lower  respiratory specimens during the acute phase of infection. The lowest  concentration of SARS-CoV-2 viral copies this assay can detect is 250  copies / mL. A negative result does not preclude SARS-CoV-2 infection  and should not be used as the sole basis for treatment or other  patient management decisions.  A negative result may occur with  improper specimen collection / handling, submission of specimen other  than nasopharyngeal swab, presence of viral mutation(s) within the  areas targeted by this assay, and inadequate number of viral copies  (<250 copies / mL). A negative result must be combined with clinical  observations, patient history, and epidemiological information. If result is POSITIVE SARS-CoV-2 target nucleic acids are DETECTED. The SARS-CoV-2 RNA is generally detectable in upper and lower  respiratory specimens dur ing the acute phase of infection.  Positive  results are indicative of active infection with SARS-CoV-2.  Clinical  correlation with patient history and other diagnostic information is  necessary to determine patient infection status.  Positive results do  not rule out bacterial infection or co-infection with other viruses. If result is PRESUMPTIVE POSTIVE SARS-CoV-2 nucleic acids MAY BE PRESENT.   A presumptive positive result was  obtained on the submitted specimen  and confirmed on repeat testing.  While 2019 novel coronavirus  (SARS-CoV-2) nucleic acids may be present in the submitted sample  additional confirmatory testing may be necessary for epidemiological  and / or clinical management purposes  to differentiate between  SARS-CoV-2 and other Sarbecovirus currently known to infect humans.  If clinically indicated additional testing with an alternate test  methodology [phone]) is advised. The SARS-CoV-2 RNA is generally  detectable in upper and lower respiratory sp ecimens during the acute  phase of infection. The expected result is Negative. Fact Sheet for Patients:  StrictlyIdeas.no Fact Sheet for Healthcare Providers: BankingDealers.co.za This test is not yet approved or cleared by the [place] FDA and has been authorized for detection and/or diagnosis of SARS-CoV-2 by FDA under an Emergency Use Authorization (EUA).  This EUA will remain in effect (meaning this test can be used) for the duration of the COVID-19 declaration under Section 564(b)(1) of the Act, 21 U.S.C. section 360bbb-3(b)(1), unless  the authorization is terminated or revoked sooner. Performed at [facility], [address], [place], [place] [address]   Urine Culture     Status: None   Collection Time: 10/04/18  4:41 PM   Specimen: Urine, Clean Catch  Result Value Ref Range Status   Specimen Description   Final    URINE, CLEAN CATCH Performed at [facility], [address], [place], [place] [address]    Special Requests   Final    NONE Performed at [facility], [address] [place], [place] [address]    Culture   Final    NO GROWTH Performed at [facility], [place] [address] [place], [place] [address]    Report Status 10/06/2018 FINAL  Final  MRSA PCR Screening     Status: None   Collection Time: 10/05/18 11:35 AM   Specimen: Nasal Mucosa; Nasopharyngeal  Result Value  Ref Range Status   MRSA by PCR NEGATIVE NEGATIVE Final    Comment:        The GeneXpert MRSA Assay (FDA approved for NASAL specimens only), is one component of a comprehensive MRSA colonization surveillance program. It is not intended to diagnose MRSA infection nor to guide or monitor treatment for MRSA infections. Performed at [facility], [address], [place], [place] [address]   Gastrointestinal Panel by PCR , Stool     Status: None   Collection Time: 10/05/18  8:00 PM   Specimen: Stool  Result Value Ref Range Status   Campylobacter species NOT DETECTED NOT DETECTED Final   Plesimonas shigelloides NOT DETECTED NOT DETECTED Final   Salmonella species NOT DETECTED NOT DETECTED Final   Yersinia enterocolitica NOT DETECTED NOT DETECTED Final   Vibrio species NOT DETECTED NOT DETECTED Final   Vibrio cholerae NOT DETECTED NOT DETECTED Final   Enteroaggregative E coli (EAEC) NOT DETECTED NOT DETECTED Final   Enteropathogenic E coli (EPEC) NOT DETECTED NOT DETECTED Final   Enterotoxigenic E coli (ETEC) NOT DETECTED NOT DETECTED Final   Shiga like toxin producing E coli (STEC) NOT DETECTED NOT DETECTED Final   Shigella/Enteroinvasive E coli (EIEC) NOT DETECTED NOT DETECTED Final   Cryptosporidium NOT DETECTED NOT DETECTED Final   Cyclospora cayetanensis NOT DETECTED NOT DETECTED Final   Entamoeba histolytica NOT DETECTED NOT DETECTED Final   Giardia lamblia NOT DETECTED NOT DETECTED Final   Adenovirus F40/41 NOT DETECTED NOT DETECTED Final   Astrovirus NOT DETECTED NOT DETECTED Final   Norovirus GI/GII NOT DETECTED NOT DETECTED Final   Rotavirus A NOT DETECTED NOT DETECTED Final   Sapovirus (I, II, IV, and V) NOT DETECTED NOT DETECTED Final    Comment: Performed at [facility], [address] [place], [place] [address]  C difficile quick scan w PCR reflex     Status: None   Collection Time: 10/05/18 10:39 PM   Specimen: STOOL  Result Value Ref Range Status   C Diff  antigen NEGATIVE NEGATIVE Final   C Diff toxin NEGATIVE NEGATIVE Final   C Diff interpretation No C. difficile detected.  Final    Comment: Performed at [facility], [address], [place], [place] [address]         Radiology Studies: No results found.      Scheduled Meds: . Chlorhexidine Gluconate Cloth  6 each Topical Daily  . dicyclomine  10 mg Oral TID AC & HS  . hydrocortisone cream   Topical BID  . levothyroxine  100 mcg Oral Q0600  .  loperamide  2 mg Oral Q6H  . mouth rinse  15 mL Mouth Rinse BID  . risperiDONE  1.5 mg Oral BID  . traZODone  150 mg Oral QHS  . venlafaxine XR  75 mg Oral Daily   Continuous Infusions: . sodium chloride 75 mL/hr at 10/08/18 1549  . heparin 1,750 Units/hr (10/08/18 0829)     LOS: 4 days    Time spent: 30 minutes    Kathie Dike, MD [facility]  If 7PM-7AM, please contact night-coverage [email or web address]  10/08/2018, 7:22 PM

## 2018-10-09 DIAGNOSIS — R197 Diarrhea, unspecified: Secondary | ICD-10-CM

## 2018-10-09 DIAGNOSIS — I824Z2 Acute embolism and thrombosis of unspecified deep veins of left distal lower extremity: Secondary | ICD-10-CM

## 2018-10-09 DIAGNOSIS — G894 Chronic pain syndrome: Secondary | ICD-10-CM

## 2018-10-09 DIAGNOSIS — E039 Hypothyroidism, unspecified: Secondary | ICD-10-CM

## 2018-10-09 DIAGNOSIS — I82402 Acute embolism and thrombosis of unspecified deep veins of left lower extremity: Secondary | ICD-10-CM | POA: Diagnosis present

## 2018-10-09 DIAGNOSIS — J9601 Acute respiratory failure with hypoxia: Secondary | ICD-10-CM

## 2018-10-09 DIAGNOSIS — J449 Chronic obstructive pulmonary disease, unspecified: Secondary | ICD-10-CM

## 2018-10-09 DIAGNOSIS — G2119 Other drug induced secondary parkinsonism: Secondary | ICD-10-CM

## 2018-10-09 LAB — CBC
HCT: 31.1 % — ABNORMAL LOW (ref 36.0–46.0)
Hemoglobin: 9.4 g/dL — ABNORMAL LOW (ref 12.0–15.0)
MCH: 26.6 pg (ref 26.0–34.0)
MCHC: 30.2 g/dL (ref 30.0–36.0)
MCV: 87.9 fL (ref 80.0–100.0)
Platelets: 400 10*3/uL (ref 150–400)
RBC: 3.54 MIL/uL — ABNORMAL LOW (ref 3.87–5.11)
RDW: 14.2 % (ref 11.5–15.5)
WBC: 14.3 10*3/uL — ABNORMAL HIGH (ref 4.0–10.5)
nRBC: 0 % (ref 0.0–0.2)

## 2018-10-09 LAB — HEPARIN LEVEL (UNFRACTIONATED): Heparin Unfractionated: 0.27 IU/mL — ABNORMAL LOW (ref 0.30–0.70)

## 2018-10-09 MED ORDER — DICYCLOMINE HCL 10 MG PO CAPS: 10.0000 mg | ORAL_CAPSULE | Freq: Three times a day (TID) | ORAL | 0 refills | Status: DC

## 2018-10-09 MED ORDER — APIXABAN 5 MG PO TABS
10.0000 mg | ORAL_TABLET | Freq: Two times a day (BID) | ORAL | Status: DC
  Administered 2018-10-09 – 2018-10-10 (×4): 10 mg via ORAL
  Filled 2018-10-09 (×4): qty 2

## 2018-10-09 MED ORDER — HYDROCODONE-ACETAMINOPHEN 5-325 MG PO TABS: 1.0000 | ORAL_TABLET | Freq: Two times a day (BID) | ORAL | 0 refills | Status: DC | PRN

## 2018-10-09 MED ORDER — APIXABAN 5 MG PO TABS: ORAL_TABLET | ORAL | 1 refills | Status: DC

## 2018-10-09 MED ORDER — LOPERAMIDE HCL 2 MG PO TABS: 2.0000 mg | ORAL_TABLET | Freq: Four times a day (QID) | ORAL | 0 refills | Status: AC | PRN

## 2018-10-09 MED ORDER — LEVOTHYROXINE SODIUM 100 MCG PO TABS: ORAL_TABLET | ORAL | 0 refills | Status: DC

## 2018-10-09 MED ORDER — APIXABAN 5 MG PO TABS: 5.0000 mg | ORAL_TABLET | Freq: Two times a day (BID) | ORAL | Status: DC

## 2018-10-09 NOTE — Care Management Important Message (Signed)
Important Message  Patient Details  Name: Jamie Pitts MRN: [id number] Date of Birth: 06-Sep-1933   Medicare Important Message Given:  Yes     [name] 10/09/2018, 2:19 PM

## 2018-10-09 NOTE — TOC Transition Note (Signed)
Transition of Care [facility]) - CM/SW Discharge Note   Patient Details  Name: Jamie Pitts MRN: [id number] Date of Birth: 12/19/1933  Transition of Care [facility]) CM/SW Contact:  Shade Flood, LCSW Phone Number: 10/09/2018, 1:34 PM   Clinical Narrative:     Pt stable for dc today per MD. Pt will return to her daughter's home at dc. [facility] will follow and [employer] [facility] is delivering DME today. RN to call EMS once notified that DME is at the the home. There are no other TOC needs for dc.  Final next level of care: Home w Home Health Services Barriers to Discharge: Barriers Resolved   Patient Goals and CMS Choice   CMS Medicare.gov Compare Post Acute Care list provided to:: Patient Represenative (must comment)([place]) Choice offered to / list presented to : Sibling  Discharge Placement                       Discharge Plan and Services   Discharge Planning Services: CM Consult Post Acute Care Choice: Durable Medical Equipment, Home Health          DME Arranged: Hospital bed, 3-N-1 DME Agency: [employer] Date DME Agency Contacted: 10/07/18 Time DME Agency Contacted: [id number] Representative spoke with at DME Agency: [place]: Social Work(active with RN/PT) [place]: [place] ([place]) Date [place]: 10/07/18 Time [place]: [id number] Representative spoke with at [place]: [place] ([place]) Interventions     Readmission Risk Interventions Readmission Risk Prevention Plan 10/09/2018  Transportation Screening Complete  PCP or Specialist Appt within 5-7 Days Complete  Home Care Screening Complete  Medication Review (RN CM) Complete  Some recent data might be hidden

## 2018-10-09 NOTE — Progress Notes (Signed)
Physical Therapy Treatment Patient Details Name: Jamie Pitts MRN: [id number] DOB: 09/12/1933 Today's Date: 10/09/2018    History of Present Illness Jamie Pitts is a 85 y.o. female with medical history significant for Parkinson's disease, hypothyroidism depression with psychosis and COPD, was brought to the ED with complaints of generalized weakness, increased confusion and falls over the past several months.  Patient tells me she has difficulty breathing, but denies chest pain. Patient tells me she has not been eating much over the past several days, due to poor appetite.  She denies vomiting or loose stools.  Patient tells me she feels weak all over.Patient lives with her husband.  Patient's daughter called EMS due to gradual decline over the past several months.I talked to patient's daughter, she reports that patient was discharged from the psych facility after a 2-week stay on 23 July.  She has chronic back pain and her chronic narcotics were recently discontinued at the psych facility as it was thought that her hydrocodone was causing auditory hallucinations. So patient has been lying in bed most of the day.  Daughter also noticed difficulty breathing over the past week at rest and significantly worse with minimal exertion.  Daughter reports multiple falls, at least 3 falls within 1 week.  Daughter reports 3-5 loose stools daily over the past 2 weeks.    PT Comments    Patient presents with her daughter present at bedside for family training in bed mobility, transfers and taking steps with RW.  Patient demonstrates slow labored movement for sitting up at bedside, slightly apprehensive due to c/o fatigue, able to transfer to [facility] and later to chair, limited to a few steps at bedside secondary to generalized weakness and poor standing balance.  Patient's daughter observed treatment understanding acknowledged to use RW and encouraged to get patient up to chair to sit for at least 1-2 hours in AM  and PM at home with understanding acknowledged.  Patient tolerated sitting up in chair after therapy - RN aware.  During treatment patient on room air with SpO2 at 92-94% prior to activity, dropped to 83-85% during transfers/taking steps with RW and returned to 92-93% after resting in chair.  Patient will benefit from continued physical therapy in hospital and recommended venue below to increase strength, balance, endurance for safe ADLs and gait.   Follow Up Recommendations  SNF     Equipment Recommendations  None recommended by PT    Recommendations for Other Services       Precautions / Restrictions Precautions Precautions: Fall Restrictions Weight Bearing Restrictions: No    Mobility  Bed Mobility Overal bed mobility: Needs Assistance Bed Mobility: Supine to Sit     Supine to sit: Min assist     General bed mobility comments: slow labored movement with fair/good return for propping up on elbows to hands during supine to sitting  Transfers Overall transfer level: Needs assistance Equipment used: Rolling walker (2 wheeled) Transfers: Sit to/from [facility] Sit to Stand: Min assist;Mod assist Stand pivot transfers: Mod assist       General transfer comment: slow labored movement during transfer to [facility] repeated verbal cueing for proper hand placement during sit to stands  Ambulation/Gait Ambulation/Gait assistance: Mod assist Gait Distance (Feet): 6 Feet Assistive device: Rolling walker (2 wheeled) Gait Pattern/deviations: Decreased step length - right;Decreased step length - left;Decreased stride length Gait velocity: slow   General Gait Details: limited to 6-7 slow labored side steps at bedside during bed/BSC/chair transfers due to generalized  weakness and poor standing balance   Stairs             Wheelchair Mobility    Modified Rankin (Stroke Patients Only)       Balance Overall balance assessment: Needs  assistance Sitting-balance support: Feet supported;No upper extremity supported Sitting balance-Leahy Scale: Fair Sitting balance - Comments: seated at bedside   Standing balance support: During functional activity;Bilateral upper extremity supported Standing balance-Leahy Scale: Poor Standing balance comment: fair/poor using RW                            Cognition Arousal/Alertness: Awake/alert Behavior During Therapy: WFL for tasks assessed/performed Overall Cognitive Status: Within Functional Limits for tasks assessed                                 General Comments: Patient apprehensive      Exercises General Exercises - Lower Extremity Long Arc Quad: Seated;AROM;Strengthening;Both;5 reps Hip Flexion/Marching: Seated;AROM;Strengthening;Both;5 reps Toe Raises: Seated;AROM;Strengthening;Both;5 reps Heel Raises: Seated;AROM;Strengthening;Both;5 reps    General Comments        Pertinent Vitals/Pain Pain Assessment: Faces Faces Pain Scale: Hurts a little bit Pain Location: low back Pain Descriptors / Indicators: Sore;Discomfort    Home Living                      Prior Function            PT Goals (current goals can now be found in the care plan section) Acute Rehab PT Goals Patient Stated Goal: return home with family to assist PT Goal Formulation: With patient Time For Goal Achievement: 10/20/18 Potential to Achieve Goals: Good Progress towards PT goals: Progressing toward goals    Frequency    Min 3X/week      PT Plan Current plan remains appropriate    Co-evaluation              AM-PAC PT "6 Clicks" Mobility   Outcome Measure  Help needed turning from your back to your side while in a flat bed without using bedrails?: A Lot Help needed moving from lying on your back to sitting on the side of a flat bed without using bedrails?: A Lot Help needed moving to and from a bed to a chair (including a wheelchair)?: A  Lot Help needed standing up from a chair using your arms (e.g., wheelchair or bedside chair)?: A Lot Help needed to walk in hospital room?: A Lot Help needed climbing 3-5 steps with a railing? : Total 6 Click Score: 11    End of Session   Activity Tolerance: Patient tolerated treatment well;Patient limited by fatigue Patient left: in chair;with call bell/phone within reach;with family/visitor present Nurse Communication: Mobility status PT Visit Diagnosis: Unsteadiness on feet (R26.81);Other abnormalities of gait and mobility (R26.89);Muscle weakness (generalized) (M62.81)     Time: [id number] PT Time Calculation (min) (ACUTE ONLY): 32 min  Charges:  $Therapeutic Activity: 23-37 mins                     2:16 PM, 10/09/18 Lonell Grandchild, MPT Physical Therapist with [facility] [phone] office [phone] mobile phone

## 2018-10-09 NOTE — Discharge Summary (Addendum)
Physician Discharge Summary  Taline Nass JFH:[id number] DOB: 1933/05/02 DOA: 10/04/2018  PCP: Ria Bush, MD  Admit date: 10/04/2018 Discharge date: 10/09/2018  Admitted From: home Disposition:  home  Recommendations for Outpatient Follow-up:  1. Follow up with PCP in 1-2 weeks 2. Palliative care will follow the patient as an outpatient. If patient begins to deteriorate, plan will be to transition to hospice  Home Health:HH PT, RN, aide, social work Equipment/Devices: Hospital bed, commode, walker, oxygen at 2 L  Discharge Condition:stable CODE STATUS:DNR Diet recommendation: heart healthy  Brief/Interim Summary: Per HPI: Zani McCarteris a 85 y.o.femalewith medical history significant forParkinson's disease, hypothyroidism depression with psychosis and COPD, was brought to the ED with complaints of generalized weakness, increased confusion and falls over the past several months. Patient tells me she has difficulty breathing, butdenies chest pain. Patient tells me she has not been eating much over the past several days,due to poor appetite. She denies vomiting or loose stools. Patient tells me she feels weak all over. Patient lives with her husband. Patient's daughter called EMS due to gradual decline over the past several months. I talked to patient's daughter, she reports that patient was 85 discharged from the psych facility after a 2-week stay on 23 July. She has chronic back pain and her chronic narcotics wererecently discontinued at the psych facility as it was thought that her hydrocodone was causing auditory hallucinations. Sopatient has been lying in bed most of the day. Daughter also noticed difficulty breathing over the past week at rest and significantly worse with minimal exertion.Daughter reports multiple falls, at least3 falls within 1 week.Daughter reports 3-5 loose stools daily over the past 2 weeks.  Patient was found to have bilateral submassive  pulmonary embolism with demonstrated RV strain on CT scan.  She was also noted to have a left lower extremity occlusive DVT.  She was admitted to the hospital for further treatments.  Discharge Diagnoses:  Active Problems:   Hypothyroidism   Drug-induced Parkinsonism (HCC)   Diarrhea   COPD (chronic obstructive pulmonary disease) (HCC)   Chronic pain syndrome   Multiple pulmonary emboli (HCC)   Palliative care by specialist   Goals of care, counseling/discussion   DNR (do not resuscitate)   Fall   Depression, recurrent ([place])   Weakness   Acute respiratory failure with hypoxia ([place])   Acute deep vein thrombosis (DVT) of left lower extremity ([place])  1. Acute respiratory failure with hypoxia secondary to bilateral PE.  She was treated with 5 days of intravenous heparin due to evidence of RV strain.  She was subsequently transitioned to Eliquis.  Echocardiogram showed EF of 60 to 65%.  Oxygen has been weaned down to 2 L.  Patient does become hypoxic on room air on ambulation.  She will likely need to discharge on 2 L of oxygen. 2. Generalized weakness.  Patient is significantly impacted by her chronic back pain.  She has been seen several physicians as an outpatient and there was consideration for outpatient lumbar epidural she was previously taking hydrocodone, but this was discontinued due to hallucinations.  She was treated with tramadol with minimal improvement of her pain.  Since she was significantly uncomfortable and it was affecting her ability to function, she was restarted on hydrocodone at a lower dose.  She appears to be tolerating at this time without any mental status changes. 3. Diarrhea.  Stool for C. difficile and GI pathogen panel were negative.  She received Imodium with improvement of stool frequency. 4. Hypothyroidism.  She was previously taking 23mg daily.  TSH noted to be elevated at 22.1.  Her Synthroid dose was increased to 1028m daily.  She will need repeat thyroid  studies in 3 to 4 weeks. 5. COPD.  Currently stable.  Continue bronchodilators as needed. 6. History of Parkinson's/depression with psychosis.  She was continued on her home dose of Risperdal, venlafaxine and trazodone. 7. Goals of care.  After several discussions with patient's daughter and husband with the assistance of palliative medicine, it was decided that patient would be taken home with family.  They wish to try to care for the patient at home.  She will be followed by outpatient palliative services.  If the patient does begin to deteriorate, she will be transitioned to hospice services  Discharge Instructions  Discharge Instructions    Diet - low sodium heart healthy   Complete by: As directed    Increase activity slowly   Complete by: As directed      Allergies as of 10/09/2018      Reactions   Amoxicillin Hives   Sores in mouth   Buprenorphine Hcl Itching   Can tolerate if necessary Can tolerate if necessary Can tolerate if necessary   Morphine Itching   Can tolerate if necessary   Sulfa Antibiotics Hives   Sores in mouth Sores in mouth   Valium [diazepam] Itching   Can tolerate if necessary      Medication List    STOP taking these medications   lamoTRIgine 100 MG tablet Commonly known as: LaMICtal   traMADol 50 MG tablet Commonly known as: ULTRAM     TAKE these medications   apixaban 5 MG Tabs tablet Commonly known as: ELIQUIS Take 1074mo bid for 7 days then 5mg17m bid   dicyclomine 10 MG capsule Commonly known as: BENTYL Take 1 capsule (10 mg total) by mouth 3 (three) times daily before meals.   HYDROcodone-acetaminophen 5-325 MG tablet Commonly known as: NORCO/VICODIN Take 1 tablet by mouth every 12 (twelve) hours as needed for moderate pain.   levothyroxine 100 MCG tablet Commonly known as: SYNTHROID 1 tablet before breakfast daily except half tablet on _0 /21/20 1107  10/09/18 1101  For home use only DME 3 n 1  Once     10/09/18 1101   10/09/18 1101  For home use only DME Walker rolling  Once    Question:  Patient needs a walker to treat with the following condition  Answer:  Generalized weakness   10/09/18 1101         Follow-up Information    [facility].   Specialty: [place] Why: PT/RN/SW       [facility] Follow up.   Why: equipment         Allergies  Allergen Reactions  . Amoxicillin Hives    Sores in mouth  . Buprenorphine Hcl Itching    Can tolerate if necessary Can tolerate if necessary Can tolerate if necessary  . Morphine Itching    Can tolerate if necessary  . Sulfa Antibiotics Hives    Sores in mouth Sores in mouth   . Valium [Diazepam] Itching    Can tolerate if necessary    Consultations:  Palliative  medicine   Procedures/Studies: Dg Pelvis 1-2 Views  Result Date: 10/04/2018 CLINICAL DATA:  Multiple falls. EXAM: PELVIS - 1-2 VIEW COMPARISON:  Pelvic radiographs 6 days ago 09/28/2018 FINDINGS: Portions of bilateral superior pubic rami are obscured by dense excreted IV contrast in the urinary bladder from chest CT earlier this day. The cortical margins of the bony pelvis are intact. No fracture. Pubic symphysis and sacroiliac joints are congruent. Both femoral heads are well-seated in the respective acetabula. IMPRESSION: No evidence of pelvic fracture. Electronically Signed   By: Keith Rake M.D.   On: 10/04/2018 23:36   Dg Pelvis 1-2 Views  Result Date: 09/28/2018 CLINICAL DATA:  Bilateral hip pain after fall. EXAM: PELVIS - 1-2 VIEW COMPARISON:  None. FINDINGS: There is no evidence of pelvic fracture or diastasis. No pelvic bone lesions are seen. IMPRESSION: Negative. Electronically Signed   By: Kerby Moors M.D.   On: 09/28/2018 18:06   Ct Head Wo Contrast  Result Date: 09/28/2018 CLINICAL DATA:  Head trauma ataxia EXAM: CT HEAD WITHOUT CONTRAST; CT CERVICAL SPINE WITHOUT CONTRAST TECHNIQUE: Contiguous axial images were obtained from the base of the skull through the vertex without intravenous contrast. COMPARISON:  February 23, 2012. FINDINGS: Brain: No evidence of acute territorial infarction, hemorrhage, hydrocephalus,extra-axial collection or mass lesion/mass effect. There is mild dilatation the ventricles and sulci consistent with age-related atrophy. Low-attenuation changes in the deep white matter consistent with small vessel ischemia. Vascular: No hyperdense vessel or unexpected calcification. Skull: The skull is intact. No fracture or focal lesion identified. Sinuses/Orbits: There is right maxillary sinus thickening. The orbits and globes intact. Other: None Cervical spine: Alignment: There is slight reversal of the normal cervical lordosis. A grade 1 anterolisthesis of C4 on C5 is  seen measuring 2 mm. Skull base and vertebrae: Visualized skull base is intact. No atlanto-occipital dissociation. The vertebral body heights are well maintained. No fracture or pathologic osseous lesion seen. Soft tissues and spinal canal: The visualized paraspinal soft tissues are unremarkable. No prevertebral soft tissue swelling is seen. The spinal canal is unremarkable, no large epidural collection or significant canal narrowing. Disc levels: Uncovertebral osteophytes and disc osteophyte complexes are seen throughout the cervical spine most notable at C5-C6 and C6-C7 with moderate to severe neural foraminal narrowing. Upper chest: Nonspecific hazy airspace opacities seen within the right lung apex there is also biapical pleural scarring seen. Other: None IMPRESSION: 1. No acute  intracranial abnormality. 2. Findings consistent with age related atrophy and chronic small vessel ischemia 3.  No acute fracture or malalignment of the spine. Electronically Signed   By: Prudencio Pair M.D.   On: 09/28/2018 15:20   Ct Angio Chest Pe W And/or Wo Contrast  Result Date: 10/04/2018 CLINICAL DATA:  Dyspnea. EXAM: CT ANGIOGRAPHY CHEST WITH CONTRAST TECHNIQUE: Multidetector CT imaging of the chest was performed using the standard protocol during bolus administration of intravenous contrast. Multiplanar CT image reconstructions and MIPs were obtained to evaluate the vascular anatomy. CONTRAST:  175m OMNIPAQUE IOHEXOL 350 MG/ML SOLN COMPARISON:  None. FINDINGS: Cardiovascular: Contrast injection is sufficient to demonstrate satisfactory opacification of the pulmonary arteries to the segmental level.The main pulmonary artery is dilated measuring 3.2 cm in diameter. The left pulmonary artery is dilated measuring 2.8 cm in diameter. There is an apparent stenosis or web involving the mid main right pulmonary artery (axial series 5, image 121). There is some poststenotic dilatation of the main right pulmonary artery. There are  acute segmental and subsegmental bilateral pulmonary emboli. There is some significant narrowing of the right upper lobe pulmonary artery. The heart size is mildly enlarged. There is no significant pericardial effusion. There is borderline right heart strain with an RV/LV ratio measuring approximately 0.9 Mediastinum/Nodes: --there are multiple small calcified mediastinal and hilar lymph nodes bilaterally. --No axillary lymphadenopathy. --No supraclavicular lymphadenopathy. --Normal thyroid gland. --The esophagus is unremarkable Lungs/Pleura: There are scattered areas of scarring and bronchiectasis involving the right upper lobe with associated pulmonary nodules measuring up to approximately 9 mm. There is atelectasis at the lung bases bilaterally. There is no significant pleural effusion. There is no pneumothorax. The trachea is unremarkable. Upper Abdomen: There are multiple calcifications throughout the spleen likely related to a prior granulomatous infection. The spleen may be enlarged but is only partially visualized. There are few calcifications in the liver, again likely related to a prior granulomatous infection. Musculoskeletal: There is no acute displaced fracture or dislocation. There is a probable old healed fracture of the sternum. Review of the MIP images confirms the above findings. IMPRESSION: 1. Acute bilateral segmental and subsegmental pulmonary emboli as detailed above. Positive for acute PE with CT evidence of borderline right heart strain (RV/LV Ratio = 0.9) consistent with at least submassive (intermediate risk) PE. The presence of right heart strain has been associated with an increased risk of morbidity and mortality. 2. There is an apparent focal narrowing of the main right pulmonary artery as detailed above with a possible web at this location. There is some poststenotic dilatation of the right pulmonary artery beyond this web. This appearance may be secondary to a chronic pulmonary  embolus versus an iatrogenic or traumatic injury. Follow-up is recommended. Consider outpatient cardiology workup and a subsequent PE study following treatment of the patient's acute pulmonary emboli to evaluate for resolution of this finding. This finding appears to contribute to significant dilatation of the main pulmonary artery and the main left pulmonary arteries. 3. Cluster of nodules in the right upper lobe with associated areas of bronchial wall thickening and bronchiectasis is favored to be post infectious in etiology or secondary to a chronic indolent infection such as MAI. Follow-up is recommended to confirm resolution or stability of the pulmonary nodules, which currently measure up to approximately 9 mm. A three-month follow-up CT is recommended. 4. Multiple calcified mediastinal and hilar lymph nodes are noted in addition to calcifications throughout the liver and spleen. These findings are likely related to  a prior granulomatous infection. These results were called by telephone at the time of interpretation on 10/04/2018 at 9:02 pm to Dr. Milton Ferguson , who verbally acknowledged these results. Electronically Signed   By: Constance Holster M.D.   On: 10/04/2018 21:06   Ct Cervical Spine Wo Contrast  Result Date: 09/28/2018 CLINICAL DATA:  Head trauma ataxia EXAM: CT HEAD WITHOUT CONTRAST; CT CERVICAL SPINE WITHOUT CONTRAST TECHNIQUE: Contiguous axial images were obtained from the base of the skull through the vertex without intravenous contrast. COMPARISON:  February 23, 2012. FINDINGS: Brain: No evidence of acute territorial infarction, hemorrhage, hydrocephalus,extra-axial collection or mass lesion/mass effect. There is mild dilatation the ventricles and sulci consistent with age-related atrophy. Low-attenuation changes in the deep white matter consistent with small vessel ischemia. Vascular: No hyperdense vessel or unexpected calcification. Skull: The skull is intact. No fracture or focal lesion  identified. Sinuses/Orbits: There is right maxillary sinus thickening. The orbits and globes intact. Other: None Cervical spine: Alignment: There is slight reversal of the normal cervical lordosis. A grade 1 anterolisthesis of C4 on C5 is seen measuring 2 mm. Skull base and vertebrae: Visualized skull base is intact. No atlanto-occipital dissociation. The vertebral body heights are well maintained. No fracture or pathologic osseous lesion seen. Soft tissues and spinal canal: The visualized paraspinal soft tissues are unremarkable. No prevertebral soft tissue swelling is seen. The spinal canal is unremarkable, no large epidural collection or significant canal narrowing. Disc levels: Uncovertebral osteophytes and disc osteophyte complexes are seen throughout the cervical spine most notable at C5-C6 and C6-C7 with moderate to severe neural foraminal narrowing. Upper chest: Nonspecific hazy airspace opacities seen within the right lung apex there is also biapical pleural scarring seen. Other: None IMPRESSION: 1. No acute intracranial abnormality. 2. Findings consistent with age related atrophy and chronic small vessel ischemia 3.  No acute fracture or malalignment of the spine. Electronically Signed   By: Prudencio Pair M.D.   On: 09/28/2018 15:20   Mr Brain Wo Contrast  Result Date: 10/05/2018 CLINICAL DATA:  Generalized muscle weakness EXAM: MRI HEAD WITHOUT CONTRAST TECHNIQUE: Multiplanar, multiecho pulse sequences of the brain and surrounding structures were obtained without intravenous contrast. COMPARISON:  Head CT 7 days ago FINDINGS: Brain: No acute infarction, hemorrhage, hydrocephalus, extra-axial collection or mass lesion. Mild for age small-vessel ischemic type change in the deep cerebral white matter. Age normal brain volume. Vascular: Major flow voids are preserved Skull and upper cervical spine: Negative for marrow lesion Sinuses/Orbits: Negative IMPRESSION: Unremarkable brain MRI for age.  Electronically Signed   By: Monte Fantasia M.D.   On: 10/05/2018 09:12   US Venous Img Lower Bilateral  Result Date: 10/05/2018 CLINICAL DATA:  Bilateral lower extremity pain and edema. History of pulmonary embolism. Evaluate for DVT. EXAM: BILATERAL LOWER EXTREMITY VENOUS DOPPLER ULTRASOUND TECHNIQUE: Gray-scale sonography with graded compression, as well as color Doppler and duplex ultrasound were performed to evaluate the lower extremity deep venous systems from the level of the common femoral vein and including the common femoral, femoral, profunda femoral, popliteal and calf veins including the posterior tibial, peroneal and gastrocnemius veins when visible. The superficial great saphenous vein was also interrogated. Spectral Doppler was utilized to evaluate flow at rest and with distal augmentation maneuvers in the common femoral, femoral and popliteal veins. COMPARISON:  None. FINDINGS: RIGHT LOWER EXTREMITY Common Femoral Vein: No evidence of thrombus. Normal compressibility, respiratory phasicity and response to augmentation. Saphenofemoral Junction: No evidence of thrombus. Normal compressibility and flow  on color Doppler imaging. Profunda Femoral Vein: No evidence of thrombus. Normal compressibility and flow on color Doppler imaging. Femoral Vein: No evidence of thrombus. Normal compressibility, respiratory phasicity and response to augmentation. Popliteal Vein: No evidence of thrombus. Normal compressibility, respiratory phasicity and response to augmentation. Calf Veins: No evidence of thrombus. Normal compressibility and flow on color Doppler imaging. Superficial Great Saphenous Vein: No evidence of thrombus. Normal compressibility. Venous Reflux:  None. Other Findings:  None. LEFT LOWER EXTREMITY Common Femoral Vein: No evidence of thrombus. Normal compressibility, respiratory phasicity and response to augmentation. Saphenofemoral Junction: No evidence of thrombus. Normal compressibility and flow  on color Doppler imaging. Profunda Femoral Vein: No evidence of thrombus. Normal compressibility and flow on color Doppler imaging. Femoral Vein: No evidence of thrombus. Normal compressibility, respiratory phasicity and response to augmentation. Popliteal Vein: No evidence of thrombus. Normal compressibility, respiratory phasicity and response to augmentation. Calf Veins: Hypoechoic occlusive thrombus is seen within the left posterior tibial (image 61) and both paired peroneal veins (image 65). Superficial Great Saphenous Vein: No evidence of thrombus. Normal compressibility. Other Findings:  None. IMPRESSION: 1. Examination is positive for occlusive DVT involving the left posterior tibial and peroneal veins. There is no extension of this distal short-segment tibial DVT to the more proximal venous system of the left lower extremity. 2. No evidence of DVT within the right lower extremity. Electronically Signed   By: Sandi Mariscal M.D.   On: 10/05/2018 10:46   Dg Chest Portable 1 View  Result Date: 10/04/2018 CLINICAL DATA:  Shortness of breath. EXAM: PORTABLE CHEST 1 VIEW COMPARISON:  Chest x-ray dated September 28, 2018. FINDINGS: The heart size and mediastinal contours are within normal limits. Normal pulmonary vascularity. No focal consolidation, pleural effusion, or pneumothorax. No acute osseous abnormality. IMPRESSION: No active disease. Electronically Signed   By: Titus Dubin M.D.   On: 10/04/2018 16:40   Dg Chest Portable 1 View  Result Date: 09/28/2018 CLINICAL DATA:  Fall EXAM: PORTABLE CHEST 1 VIEW COMPARISON:  03/02/2013 FINDINGS: Cardiomediastinal silhouette is within normal limits. Tortuosity of the thoracic aorta. Minimal left basilar atelectasis. Lungs otherwise clear. No pleural effusion. No pneumothorax. No acute osseous abnormality is identified. IMPRESSION: No acute cardiopulmonary findings. Electronically Signed   By: Davina Poke M.D.   On: 09/28/2018 13:13       Subjective: Denies shortness of breath. Diarrhea has improved. Still has back pain, but better with pain medications.  Discharge Exam: Vitals:   10/08/18 1349 10/08/18 1952 10/08/18 2151 10/09/18 0516  BP: (!) 126/56  (!) 146/65 (!) 144/69  Pulse: (!) 106  (!) 103 (!) 113  Resp: 20   18  Temp:   98.7 F (37.1 C) 98 F (36.7 C)  TempSrc:   Oral Oral  SpO2: 90% 91% 93% 94%  Weight:      Height:        General: Pt is alert, awake, not in acute distress Cardiovascular: RRR, S1/S2 +, no rubs, no gallops Respiratory: CTA bilaterally, no wheezing, no rhonchi Abdominal: Soft, NT, ND, bowel sounds + Extremities: no edema, no cyanosis    The results of significant diagnostics from this hospitalization (including imaging, microbiology, ancillary and laboratory) are listed below for reference.     Microbiology: Recent Results (from the past 240 hour(s))  SARS Coronavirus 2 [facility] order, Performed in [facility] hospital lab) Nasopharyngeal Nasopharyngeal Swab     Status: None   Collection Time: 10/04/18  4:26 PM   Specimen: Nasopharyngeal Swab  Result Value Ref Range Status   SARS Coronavirus 2 NEGATIVE NEGATIVE Final    Comment: (NOTE) If result is NEGATIVE SARS-CoV-2 target nucleic acids are NOT DETECTED. The SARS-CoV-2 RNA is generally detectable in upper and lower  respiratory specimens during the acute phase of infection. The lowest  concentration of SARS-CoV-2 viral copies this assay can detect is 250  copies / mL. A negative result does not preclude SARS-CoV-2 infection  and should not be used as the sole basis for treatment or other  patient management decisions.  A negative result may occur with  improper specimen collection / handling, submission of specimen other  than nasopharyngeal swab, presence of viral mutation(s) within the  areas targeted by this assay, and inadequate number of viral copies  (<250 copies / mL). A negative result must be combined with  clinical  observations, patient history, and epidemiological information. If result is POSITIVE SARS-CoV-2 target nucleic acids are DETECTED. The SARS-CoV-2 RNA is generally detectable in upper and lower  respiratory specimens dur ing the acute phase of infection.  Positive  results are indicative of active infection with SARS-CoV-2.  Clinical  correlation with patient history and other diagnostic information is  necessary to determine patient infection status.  Positive results do  not rule out bacterial infection or co-infection with other viruses. If result is PRESUMPTIVE POSTIVE SARS-CoV-2 nucleic acids MAY BE PRESENT.   A presumptive positive result was obtained on the submitted specimen  and confirmed on repeat testing.  While 2019 novel coronavirus  (SARS-CoV-2) nucleic acids may be present in the submitted sample  additional confirmatory testing may be necessary for epidemiological  and / or clinical management purposes  to differentiate between  SARS-CoV-2 and other Sarbecovirus currently known to infect humans.  If clinically indicated additional testing with an alternate test  methodology [phone]) is advised. The SARS-CoV-2 RNA is generally  detectable in upper and lower respiratory sp ecimens during the acute  phase of infection. The expected result is Negative. Fact Sheet for Patients:  StrictlyIdeas.no Fact Sheet for Healthcare Providers: BankingDealers.co.za This test is not yet approved or cleared by the [place] FDA and has been authorized for detection and/or diagnosis of SARS-CoV-2 by FDA under an Emergency Use Authorization (EUA).  This EUA will remain in effect (meaning this test can be used) for the duration of the COVID-19 declaration under Section 564(b)(1) of the Act, 21 U.S.C. section 360bbb-3(b)(1), unless the authorization is terminated or revoked sooner. Performed at [facility], [address],  [place], [place] [address]   Urine Culture     Status: None   Collection Time: 10/04/18  4:41 PM   Specimen: Urine, Clean Catch  Result Value Ref Range Status   Specimen Description   Final    URINE, CLEAN CATCH Performed at [facility], [address], [place], [place] [address]    Special Requests   Final    NONE Performed at [facility], [address] [place], [place] [address]    Culture   Final    NO GROWTH Performed at [facility], [place] [address], [place], [place] [address]    Report Status 10/06/2018 FINAL  Final  MRSA PCR Screening     Status: None   Collection Time: 10/05/18 11:35 AM   Specimen: Nasal Mucosa; Nasopharyngeal  Result Value Ref Range Status   MRSA by PCR NEGATIVE NEGATIVE Final    Comment:        The GeneXpert MRSA Assay (FDA  approved for NASAL specimens only), is one component of a comprehensive MRSA colonization surveillance program. It is not intended to diagnose MRSA infection nor to guide or monitor treatment for MRSA infections. Performed at [facility], [address], [place], [place] [address]   Gastrointestinal Panel by PCR , Stool     Status: None   Collection Time: 10/05/18  8:00 PM   Specimen: Stool  Result Value Ref Range Status   Campylobacter species NOT DETECTED NOT DETECTED Final   Plesimonas shigelloides NOT DETECTED NOT DETECTED Final   Salmonella species NOT DETECTED NOT DETECTED Final   Yersinia enterocolitica NOT DETECTED NOT DETECTED Final   Vibrio species NOT DETECTED NOT DETECTED Final   Vibrio cholerae NOT DETECTED NOT DETECTED Final   Enteroaggregative E coli (EAEC) NOT DETECTED NOT DETECTED Final   Enteropathogenic E coli (EPEC) NOT DETECTED NOT DETECTED Final   Enterotoxigenic E coli (ETEC) NOT DETECTED NOT DETECTED Final   Shiga like toxin producing E coli (STEC) NOT DETECTED NOT DETECTED Final   Shigella/Enteroinvasive E coli (EIEC) NOT DETECTED NOT DETECTED Final   Cryptosporidium NOT DETECTED NOT DETECTED  Final   Cyclospora cayetanensis NOT DETECTED NOT DETECTED Final   Entamoeba histolytica NOT DETECTED NOT DETECTED Final   Giardia lamblia NOT DETECTED NOT DETECTED Final   Adenovirus F40/41 NOT DETECTED NOT DETECTED Final   Astrovirus NOT DETECTED NOT DETECTED Final   Norovirus GI/GII NOT DETECTED NOT DETECTED Final   Rotavirus A NOT DETECTED NOT DETECTED Final   Sapovirus (I, II, IV, and V) NOT DETECTED NOT DETECTED Final    Comment: Performed at [facility], [address] [place], [place] [address]  C difficile quick scan w PCR reflex     Status: None   Collection Time: 10/05/18 10:39 PM   Specimen: STOOL  Result Value Ref Range Status   C Diff antigen NEGATIVE NEGATIVE Final   C Diff toxin NEGATIVE NEGATIVE Final   C Diff interpretation No C. difficile detected.  Final    Comment: Performed at [facility], [address] [place], [place] [address]     Labs: BNP (last 3 results) Recent Labs    10/04/18 1540  BNP 157.2*   Basic Metabolic Panel: Recent Labs  Lab 10/04/18 1540 10/05/18 0531 10/06/18 0157 10/06/18 1532 10/07/18 0417 10/08/18 0507  NA 126* 129* 127*  --  131* 131*  K 4.0 4.3 4.1  --  3.9 3.9  CL 92* 96* 96*  --  101 104  CO2 23 24 20*  --  20* 19*  GLUCOSE 84 90 99  --  103* 85  BUN _0 --  15 13  CREATININE 1.03* 1.00 0.96  --  0.93 0.80  CALCIUM 8.5* 8.5* 7.7* 7.9* 8.1* 7.8*   Liver Function Tests: Recent Labs  Lab 10/04/18 1540  AST 14*  ALT 17  ALKPHOS 98  BILITOT 0.6  PROT 6.4*  ALBUMIN 2.3*   No results for input(s): LIPASE, AMYLASE in the last 168 hours. No results for input(s): AMMONIA in the last 168 hours. CBC: Recent Labs  Lab 10/04/18 1540 10/05/18 0531 10/06/18 0157 10/07/18 0417 10/08/18 0507 10/09/18 0506  WBC 14.2* 13.0* 14.9* 14.9* 14.7* 14.3*  NEUTROABS 11.1*  --   --   --   --   --   HGB 11.1* 10.3* 10.0* 9.6* 9.2* 9.4*  HCT 36.2 33.7* 32.7* 30.9* 30.6* 31.1*  MCV 86.0 87.3 88.4 86.3 89.0 87.9   PLT  446* 392 423* 451* 409* 400   Cardiac Enzymes: No results for input(s): CKTOTAL, CKMB, CKMBINDEX, TROPONINI in the last 168 hours. BNP: Invalid input(s): POCBNP CBG: No results for input(s): GLUCAP in the last 168 hours. D-Dimer No results for input(s): DDIMER in the last 72 hours. Hgb A1c No results for input(s): HGBA1C in the last 72 hours. Lipid Profile No results for input(s): CHOL, HDL, LDLCALC, TRIG, CHOLHDL, LDLDIRECT in the last 72 hours. Thyroid function studies No results for input(s): TSH, T4TOTAL, T3FREE, THYROIDAB in the last 72 hours.  Invalid input(s): FREET3 Anemia work up No results for input(s): VITAMINB12, FOLATE, FERRITIN, TIBC, IRON, RETICCTPCT in the last 72 hours. Urinalysis    Component Value Date/Time   COLORURINE YELLOW 10/04/2018 1626   APPEARANCEUR CLEAR 10/04/2018 1626   LABSPEC 1.016 10/04/2018 1626   PHURINE 5.0 10/04/2018 1626   GLUCOSEU NEGATIVE 10/04/2018 1626   HGBUR SMALL (A) 10/04/2018 1626   BILIRUBINUR NEGATIVE 10/04/2018 1626   BILIRUBINUR negative 03/05/2017 1138   KETONESUR 20 (A) 10/04/2018 1626   PROTEINUR NEGATIVE 10/04/2018 1626   UROBILINOGEN 0.2 03/05/2017 1138   UROBILINOGEN 0.2 05/08/2014 0140   NITRITE NEGATIVE 10/04/2018 1626   LEUKOCYTESUR NEGATIVE 10/04/2018 1626   Sepsis Labs Invalid input(s): PROCALCITONIN,  WBC,  LACTICIDVEN Microbiology Recent Results (from the past 240 hour(s))  SARS Coronavirus 2 [facility] order, Performed in [facility] hospital lab) Nasopharyngeal Nasopharyngeal Swab     Status: None   Collection Time: 10/04/18  4:26 PM   Specimen: Nasopharyngeal Swab  Result Value Ref Range Status   SARS Coronavirus 2 NEGATIVE NEGATIVE Final    Comment: (NOTE) If result is NEGATIVE SARS-CoV-2 target nucleic acids are NOT DETECTED. The SARS-CoV-2 RNA is generally detectable in upper and lower  respiratory specimens during the acute phase of infection. The lowest  concentration of SARS-CoV-2 viral  copies this assay can detect is 250  copies / mL. A negative result does not preclude SARS-CoV-2 infection  and should not be used as the sole basis for treatment or other  patient management decisions.  A negative result may occur with  improper specimen collection / handling, submission of specimen other  than nasopharyngeal swab, presence of viral mutation(s) within the  areas targeted by this assay, and inadequate number of viral copies  (<250 copies / mL). A negative result must be combined with clinical  observations, patient history, and epidemiological information. If result is POSITIVE SARS-CoV-2 target nucleic acids are DETECTED. The SARS-CoV-2 RNA is generally detectable in upper and lower  respiratory specimens dur ing the acute phase of infection.  Positive  results are indicative of active infection with SARS-CoV-2.  Clinical  correlation with patient history and other diagnostic information is  necessary to determine patient infection status.  Positive results do  not rule out bacterial infection or co-infection with other viruses. If result is PRESUMPTIVE POSTIVE SARS-CoV-2 nucleic acids MAY BE PRESENT.   A presumptive positive result was obtained on the submitted specimen  and confirmed on repeat testing.  While 2019 novel coronavirus  (SARS-CoV-2) nucleic acids may be present in the submitted sample  additional confirmatory testing may be necessary for epidemiological  and / or clinical management purposes  to differentiate between  SARS-CoV-2 and other Sarbecovirus currently known to infect humans.  If clinically indicated additional testing with an alternate test  methodology [phone]) is advised. The SARS-CoV-2 RNA is generally  detectable in upper and lower respiratory sp ecimens during the acute  phase of  infection. The expected result is Negative. Fact Sheet for Patients:  StrictlyIdeas.no Fact Sheet for Healthcare  Providers: BankingDealers.co.za This test is not yet approved or cleared by the [place] FDA and has been authorized for detection and/or diagnosis of SARS-CoV-2 by FDA under an Emergency Use Authorization (EUA).  This EUA will remain in effect (meaning this test can be used) for the duration of the COVID-19 declaration under Section 564(b)(1) of the Act, 21 U.S.C. section 360bbb-3(b)(1), unless the authorization is terminated or revoked sooner. Performed at [facility], [address], [place], [place] [address]   Urine Culture     Status: None   Collection Time: 10/04/18  4:41 PM   Specimen: Urine, Clean Catch  Result Value Ref Range Status   Specimen Description   Final    URINE, CLEAN CATCH Performed at [facility], [address], [place], [place] [address]    Special Requests   Final    NONE Performed at [facility], [address], [place], [place] [address]    Culture   Final    NO GROWTH Performed at [facility], [place] [address] [place], [place] [address]    Report Status 10/06/2018 FINAL  Final  MRSA PCR Screening     Status: None   Collection Time: 10/05/18 11:35 AM   Specimen: Nasal Mucosa; Nasopharyngeal  Result Value Ref Range Status   MRSA by PCR NEGATIVE NEGATIVE Final    Comment:        The GeneXpert MRSA Assay (FDA approved for NASAL specimens only), is one component of a comprehensive MRSA colonization surveillance program. It is not intended to diagnose MRSA infection nor to guide or monitor treatment for MRSA infections. Performed at [facility], [address] [place], [place] [address]   Gastrointestinal Panel by PCR , Stool     Status: None   Collection Time: 10/05/18  8:00 PM   Specimen: Stool  Result Value Ref Range Status   Campylobacter species NOT DETECTED NOT DETECTED Final   Plesimonas shigelloides NOT DETECTED NOT DETECTED Final   Salmonella species NOT DETECTED NOT DETECTED Final   Yersinia  enterocolitica NOT DETECTED NOT DETECTED Final   Vibrio species NOT DETECTED NOT DETECTED Final   Vibrio cholerae NOT DETECTED NOT DETECTED Final   Enteroaggregative E coli (EAEC) NOT DETECTED NOT DETECTED Final   Enteropathogenic E coli (EPEC) NOT DETECTED NOT DETECTED Final   Enterotoxigenic E coli (ETEC) NOT DETECTED NOT DETECTED Final   Shiga like toxin producing E coli (STEC) NOT DETECTED NOT DETECTED Final   Shigella/Enteroinvasive E coli (EIEC) NOT DETECTED NOT DETECTED Final   Cryptosporidium NOT DETECTED NOT DETECTED Final   Cyclospora cayetanensis NOT DETECTED NOT DETECTED Final   Entamoeba histolytica NOT DETECTED NOT DETECTED Final   Giardia lamblia NOT DETECTED NOT DETECTED Final   Adenovirus F40/41 NOT DETECTED NOT DETECTED Final   Astrovirus NOT DETECTED NOT DETECTED Final   Norovirus GI/GII NOT DETECTED NOT DETECTED Final   Rotavirus A NOT DETECTED NOT DETECTED Final   Sapovirus (I, II, IV, and V) NOT DETECTED NOT DETECTED Final    Comment: Performed at [facility], [address] [place],  [address]  C difficile quick scan w PCR reflex     Status: None   Collection Time: 10/05/18 10:39 PM   Specimen: STOOL  Result Value Ref Range Status   C Diff antigen NEGATIVE NEGATIVE Final   C Diff toxin NEGATIVE NEGATIVE Final   C  Diff interpretation No C. difficile detected.  Final    Comment: Performed at [facility], [address] [place], [place] [address]     Time coordinating discharge: 70mns  SIGNED:   JKathie Dike MD  [facility] 10/09/2018, 12:28 PM   If 7PM-7AM, please contact night-coverage [email or web address]

## 2018-10-09 NOTE — Progress Notes (Signed)
Patient unable to be discharged home due to equipment not being delivered to patient daughter home.  Notified mid-level of patient being unable to discharge tonight.  Daughter will notify staff when equipment is delivered.  Will continue to monitor patient.

## 2018-10-09 NOTE — Progress Notes (Signed)
[facility] --> Eliquis Indication: confirmed DVT, PE  Allergies  Allergen Reactions  . Amoxicillin Hives    Sores in mouth  . Buprenorphine Hcl Itching    Can tolerate if necessary Can tolerate if necessary Can tolerate if necessary  . Morphine Itching    Can tolerate if necessary  . Sulfa Antibiotics Hives    Sores in mouth Sores in mouth   . Valium [Diazepam] Itching    Can tolerate if necessary    Patient Measurements: Height: 5\' 6"  (167.6 cm) Weight: 158 lb 11.7 oz (72 kg) IBW/kg (Calculated) : 59.3  HEPARIN DW (KG): 74.9  Vital Signs: Temp: 98 F (36.7 C) (08/21 0516) Temp Source: Oral (08/21 0516) BP: 144/69 (08/21 0516) Pulse Rate: 113 (08/21 0516)  Labs: Recent Labs    10/07/18 0417 10/07/18 1633 10/08/18 0507 10/09/18 0506  HGB 9.6*  --  9.2* 9.4*  HCT 30.9*  --  30.6* 31.1*  PLT 451*  --  409* 400  HEPARINUNFRC 0.24* 0.45 0.35 0.27*  CREATININE 0.93  --  0.80  --     Estimated Creatinine Clearance: 55.1 mL/min (by C-G formula based on SCr of 0.8 mg/dL).   Medical History: Past Medical History:  Diagnosis Date  . Adenomatous polyp    x1  . ALLERGIC RHINITIS 12/20/2006  . Chronic anxiety   . Chronic back pain 04/14/2007   02/2017 [facility] records reviewed - mild lumbar DDD and L SIJ pain s/p ESI latest 10/2016 with good effect  . Diverticulitis   . GERD (gastroesophageal reflux disease)    intermittent, treated with tums  . Hiatal hernia   . Hypothyroidism   . Insomnia   . Osteoporosis 2007   declined prolia  . Perforation of right tympanic membrane 09/03/2015   Chronic, seen by Dr Janace Hoard ENT   . Personal history of kidney stones    x1  . Postmenopausal atrophic vaginitis 2016   rec estrace cream - [place]  . Recurrent UTI   . Scoliosis   . Severe recurrent depression with psychosis ([place])   . Urinary retention   . Vitamin D deficiency      Assessment:  Pharmacy consulted to dose  heparin for this 85 yo female for possible PE. Patient has been short of breath. She hasn't been on an anti-coagulant prior to admission. Baseline CBC is WNL. Family members contemplating SNF versus hospice placement soon. Patient with submassive bilateral PE with left occlusive DVT.  Plan is for 5 days on heparin infusion then transition to eliquis. Transition to eliquis today.  Goal of Therapy:  Monitor platelets by anticoagulation protocol: Yes   Plan:  D/C heparin Eliquis 10mg  po bid for 7 days, then 5mg  po bid Educate on eliquis Continue to monitor H&H and platelets.  Isac Sarna, BS Pharm D, BCPS Clinical Pharmacist Pager [phone] 10/09/2018 9:00 AM

## 2018-10-09 NOTE — Progress Notes (Signed)
SATURATION QUALIFICATIONS: (This note is used to comply with regulatory documentation for home oxygen)  Patient Saturations on Room Air at Rest = 85%  Patient Saturations on Room Air while Ambulating = 85%  Patient Saturations on 2 Liters of oxygen while Ambulating = 99%  Please briefly explain why patient needs home oxygen:

## 2018-10-09 NOTE — Progress Notes (Addendum)
SATURATION QUALIFICATIONS: (This note is used to comply with regulatory documentation for home oxygen)  Patient Saturations on Room Air at Rest = 95  Patient Saturations on Room Air while Ambulating = 85  Patient Saturations on 2 Liters of oxygen while Ambulating = 97% Please briefly explain why patient needs home oxygen: Patient is unable to maintain oxygen saturation 92%> with exertion. Oxygen levels dropped to 85% on room air. Elodia Florence RN 10/09/2018 @ 1145

## 2018-10-10 NOTE — Progress Notes (Signed)
Called EMS to see if a possible time for pick up was available.  EMS operator stated only one truck available and could not give a possible time for patient pick up.

## 2018-10-10 NOTE — Progress Notes (Signed)
PROGRESS NOTE    Jamie Pitts  [id number] DOB: April 21, 1933 DOA: 10/04/2018 PCP: Ria Bush, MD   Brief Narrative:  Per HPI: Jamie Pitts a 85 y.o.femalewith medical history significant forParkinson's disease, hypothyroidism depression with psychosis and COPD, was brought to the ED with complaints of generalized weakness, increased confusion and falls over the past several months. Patient tells me she has difficulty breathing, butdenies chest pain. Patient tells me she has not been eating much over the past several days,due to poor appetite. She denies vomiting or loose stools. Patient tells me she feels weak all over. Patient lives with her husband. Patient's daughter called EMS due to gradual decline over the past several months. I talked to patient's daughter, she reports that patient was discharged from the psych facility after a 2-week stay on 23 July. She has chronic back pain and her chronic narcotics wererecently discontinued at the psych facility as it was thought that her hydrocodone was causing auditory hallucinations. Sopatient has been lying in bed most of the day. Daughter also noticed difficulty breathing over the past week at rest and significantly worse with minimal exertion.Daughter reports multiple falls, at least3 falls within 1 week.Daughter reports 3-5 loose stools daily over the past 2 weeks.  Patient was admitted with bilateral submassive pulmonary embolism with demonstrated RV strain on CT scan.  2D echocardiogram currently pending.  Bilateral lower extremity ultrasound with left-sided occlusive DVT noted and no DVT to the right lower extremity.  Brain MRI performed for generalized weakness with no acute findings.  PT evaluation reveals need for SNF.   Assessment & Plan:   Active Problems:   Hypothyroidism   Drug-induced Parkinsonism (HCC)   Diarrhea   COPD (chronic obstructive pulmonary disease) (HCC)   Chronic pain syndrome  Multiple pulmonary emboli (HCC)   Palliative care by specialist   Goals of care, counseling/discussion   DNR (do not resuscitate)   Fall   Depression, recurrent ([place])   Weakness   Acute respiratory failure with hypoxia (HCC)   Acute deep vein thrombosis (DVT) of left lower extremity ([place])   Acute hypoxemic respiratory failure secondary to submassive bilateral PE with left occlusive DVT -This is all likely secondary to recent immobility with gradual decline in functional status over several months -She was treated for total of 5 days with intravenous heparin and subsequently transitioned to Eliquis. -2D echocardiogram with LVEF 60-65%  Generalized weakness/failure to thrive over several months -Appreciate PT evaluation with recommendations for SNF -Palliative following and family members debating between SNF and hospice -Would like to pursue outpatient lumbar epidural to see if patient might be able to participate in rehab-this was discussed with palliative care  Multiple falls- multifactorial with fragility and deconditioning -No evidence of trauma or pelvic fracture noted -Fall precautions and PT evaluation  Diarrhea -Stool C. difficile negative -Continue hydration -GI panel negative -We will use Imodium for diarrhea and Bentyl for abdominal cramping. -Diarrhea appears to be improving  Hyponatremia-likely secondary to poor p.o. intake -Continue normal saline and trend with repeat BMP in a.m.  Hypothyroidism -Patient is chronically on Synthroid 75 mcg daily -TSH noted to be 22.12, will increase Synthroid to 100 mcg daily  Hypocalcemia -Slowly improving. -PTH and ionized calcium normal  COPD-stable -Duo nebs as needed for wheezing  History of parkinsonism/depression with psychosis -Continue home risperidone, venlafaxine and trazodone -Holding lamotrigine as it does not appear patient has been taking this at home  Chronic pain -Patient has significant pain to  the point that it  is affecting her level of functioning.  She appears quite uncomfortable.  Previously, she was taken off hydrocodone due to hallucinations.  Her husband reports that she was taking 3 to 4 tablets a day in the past.  In the hospital, she was started on tramadol, which did not appear to be adequately controlling her pain.  After discussing risks and benefits, family has agreed to restart hydrocodone at lower dose, 2 tablets a day.  She appears to be improving with this.  DVT prophylaxis:  Eliquis Code Status: DNR Family Communication:  No family present Disposition Plan:  Patient was discharged yesterday, but unable to go home since her home DME was not delivered in time.  Plan will be to discharge home today once equipment at home is set up.   Consultants:   Palliative Care  Procedures:   None  Antimicrobials:   None   Subjective: Denies any shortness of breath.  Feeling better.  Objective: Vitals:   10/09/18 1130 10/09/18 1135 10/09/18 2235 10/10/18 0613  BP:   122/67 134/75  Pulse:   (!) 102 (!) 111  Resp:   20 18  Temp:   98.7 F (37.1 C) 98 F (36.7 C)  TempSrc:   Oral Oral  SpO2: (!) 85% 93% 92% 91%  Weight:      Height:        Intake/Output Summary (Last 24 hours) at 10/10/2018 1128 Last data filed at 10/10/2018 0900 Gross per 24 hour  Intake 120 ml  Output -  Net 120 ml   Filed Weights   10/04/18 1435 10/05/18 1123 10/06/18 0500  Weight: 77.6 kg 76.7 kg 72 kg    Examination:  General exam: Alert, awake, oriented x 3 Respiratory system: Clear to auscultation. Respiratory effort normal. Cardiovascular system:RRR. No murmurs, rubs, gallops.  Data Reviewed: I have personally reviewed following labs and imaging studies  CBC: Recent Labs  Lab 10/04/18 1540 10/05/18 0531 10/06/18 0157 10/07/18 0417 10/08/18 0507 10/09/18 0506  WBC 14.2* 13.0* 14.9* 14.9* 14.7* 14.3*  NEUTROABS 11.1*  --   --   --   --   --   HGB 11.1* 10.3*  10.0* 9.6* 9.2* 9.4*  HCT 36.2 33.7* 32.7* 30.9* 30.6* 31.1*  MCV 86.0 87.3 88.4 86.3 89.0 87.9  PLT 446* 392 423* 451* 409* [id number]   Basic Metabolic Panel: Recent Labs  Lab 10/04/18 1540 10/05/18 0531 10/06/18 0157 10/06/18 1532 10/07/18 0417 10/08/18 0507  NA 126* 129* 127*  --  131* 131*  K 4.0 4.3 4.1  --  3.9 3.9  CL 92* 96* 96*  --  101 104  CO2 23 24 20*  --  20* 19*  GLUCOSE 84 90 99  --  103* 85  BUN 12 14 17   --  15 13  CREATININE 1.03* 1.00 0.96  --  0.93 0.80  CALCIUM 8.5* 8.5* 7.7* 7.9* 8.1* 7.8*   GFR: Estimated Creatinine Clearance: 55.1 mL/min (by C-G formula based on SCr of 0.8 mg/dL). Liver Function Tests: Recent Labs  Lab 10/04/18 1540  AST 14*  ALT 17  ALKPHOS 98  BILITOT 0.6  PROT 6.4*  ALBUMIN 2.3*   No results for input(s): LIPASE, AMYLASE in the last 168 hours. No results for input(s): AMMONIA in the last 168 hours. Coagulation Profile: No results for input(s): INR, PROTIME in the last 168 hours. Cardiac Enzymes: No results for input(s): CKTOTAL, CKMB, CKMBINDEX, TROPONINI in the last 168 hours. BNP (last 3 results) No results for  input(s): PROBNP in the last 8760 hours. HbA1C: No results for input(s): HGBA1C in the last 72 hours. CBG: No results for input(s): GLUCAP in the last 168 hours. Lipid Profile: No results for input(s): CHOL, HDL, LDLCALC, TRIG, CHOLHDL, LDLDIRECT in the last 72 hours. Thyroid Function Tests: No results for input(s): TSH, T4TOTAL, FREET4, T3FREE, THYROIDAB in the last 72 hours. Anemia Panel: No results for input(s): VITAMINB12, FOLATE, FERRITIN, TIBC, IRON, RETICCTPCT in the last 72 hours. Sepsis Labs: No results for input(s): PROCALCITON, LATICACIDVEN in the last 168 hours.  Recent Results (from the past 240 hour(s))  SARS Coronavirus 2 [facility] order, Performed in [facility] hospital lab) Nasopharyngeal Nasopharyngeal Swab     Status: None   Collection Time: 10/04/18  4:26 PM   Specimen: Nasopharyngeal Swab   Result Value Ref Range Status   SARS Coronavirus 2 NEGATIVE NEGATIVE Final    Comment: (NOTE) If result is NEGATIVE SARS-CoV-2 target nucleic acids are NOT DETECTED. The SARS-CoV-2 RNA is generally detectable in upper and lower  respiratory specimens during the acute phase of infection. The lowest  concentration of SARS-CoV-2 viral copies this assay can detect is 250  copies / mL. A negative result does not preclude SARS-CoV-2 infection  and should not be used as the sole basis for treatment or other  patient management decisions.  A negative result may occur with  improper specimen collection / handling, submission of specimen other  than nasopharyngeal swab, presence of viral mutation(s) within the  areas targeted by this assay, and inadequate number of viral copies  (<250 copies / mL). A negative result must be combined with clinical  observations, patient history, and epidemiological information. If result is POSITIVE SARS-CoV-2 target nucleic acids are DETECTED. The SARS-CoV-2 RNA is generally detectable in upper and lower  respiratory specimens dur ing the acute phase of infection.  Positive  results are indicative of active infection with SARS-CoV-2.  Clinical  correlation with patient history and other diagnostic information is  necessary to determine patient infection status.  Positive results do  not rule out bacterial infection or co-infection with other viruses. If result is PRESUMPTIVE POSTIVE SARS-CoV-2 nucleic acids MAY BE PRESENT.   A presumptive positive result was obtained on the submitted specimen  and confirmed on repeat testing.  While 2019 novel coronavirus  (SARS-CoV-2) nucleic acids may be present in the submitted sample  additional confirmatory testing may be necessary for epidemiological  and / or clinical management purposes  to differentiate between  SARS-CoV-2 and other Sarbecovirus currently known to infect humans.  If clinically indicated additional  testing with an alternate test  methodology [id number]) is advised. The SARS-CoV-2 RNA is generally  detectable in upper and lower respiratory sp ecimens during the acute  phase of infection. The expected result is Negative. Fact Sheet for Patients:  StrictlyIdeas.no Fact Sheet for Healthcare Providers: BankingDealers.co.za This test is not yet approved or cleared by the [place] FDA and has been authorized for detection and/or diagnosis of SARS-CoV-2 by FDA under an Emergency Use Authorization (EUA).  This EUA will remain in effect (meaning this test can be used) for the duration of the COVID-19 declaration under Section 564(b)(1) of the Act, 21 U.S.C. section 360bbb-3(b)(1), unless the authorization is terminated or revoked sooner. Performed at [facility], [address], [place], [place] [address]   Urine Culture     Status: None   Collection Time: 10/04/18  4:41 PM   Specimen: Urine, Clean Catch  Result Value Ref  Range Status   Specimen Description   Final    URINE, CLEAN CATCH Performed at [facility], [address], [place], [place] [address]    Special Requests   Final    NONE Performed at [facility], [address] [place], [place] [address]    Culture   Final    NO GROWTH Performed at [facility], [place] [address], [place], [place] [address]    Report Status 10/06/2018 FINAL  Final  MRSA PCR Screening     Status: None   Collection Time: 10/05/18 11:35 AM   Specimen: Nasal Mucosa; Nasopharyngeal  Result Value Ref Range Status   MRSA by PCR NEGATIVE NEGATIVE Final    Comment:        The GeneXpert MRSA Assay (FDA approved for NASAL specimens only), is one component of a comprehensive MRSA colonization surveillance program. It is not intended to diagnose MRSA infection nor to guide or monitor treatment for MRSA infections. Performed at [facility], [address], [place], [place] [address]    Gastrointestinal Panel by PCR , Stool     Status: None   Collection Time: 10/05/18  8:00 PM   Specimen: Stool  Result Value Ref Range Status   Campylobacter species NOT DETECTED NOT DETECTED Final   Plesimonas shigelloides NOT DETECTED NOT DETECTED Final   Salmonella species NOT DETECTED NOT DETECTED Final   Yersinia enterocolitica NOT DETECTED NOT DETECTED Final   Vibrio species NOT DETECTED NOT DETECTED Final   Vibrio cholerae NOT DETECTED NOT DETECTED Final   Enteroaggregative E coli (EAEC) NOT DETECTED NOT DETECTED Final   Enteropathogenic E coli (EPEC) NOT DETECTED NOT DETECTED Final   Enterotoxigenic E coli (ETEC) NOT DETECTED NOT DETECTED Final   Shiga like toxin producing E coli (STEC) NOT DETECTED NOT DETECTED Final   Shigella/Enteroinvasive E coli (EIEC) NOT DETECTED NOT DETECTED Final   Cryptosporidium NOT DETECTED NOT DETECTED Final   Cyclospora cayetanensis NOT DETECTED NOT DETECTED Final   Entamoeba histolytica NOT DETECTED NOT DETECTED Final   Giardia lamblia NOT DETECTED NOT DETECTED Final   Adenovirus F40/41 NOT DETECTED NOT DETECTED Final   Astrovirus NOT DETECTED NOT DETECTED Final   Norovirus GI/GII NOT DETECTED NOT DETECTED Final   Rotavirus A NOT DETECTED NOT DETECTED Final   Sapovirus (I, II, IV, and V) NOT DETECTED NOT DETECTED Final    Comment: Performed at [facility], [address] [place], [place] [address]  C difficile quick scan w PCR reflex     Status: None   Collection Time: 10/05/18 10:39 PM   Specimen: STOOL  Result Value Ref Range Status   C Diff antigen NEGATIVE NEGATIVE Final   C Diff toxin NEGATIVE NEGATIVE Final   C Diff interpretation No C. difficile detected.  Final    Comment: Performed at [facility], [address], [place],  [address]         Radiology Studies: No results found.      Scheduled Meds: . apixaban  10 mg Oral BID   Followed by  . [START ON 10/16/2018] apixaban  5 mg Oral BID  .  Chlorhexidine Gluconate Cloth  6 each Topical Daily  . dicyclomine  10 mg Oral TID AC & HS  . hydrocortisone cream   Topical BID  . levothyroxine  100 mcg Oral Q0600  . loperamide  2 mg Oral Q6H  . mouth rinse  15 mL Mouth Rinse BID  . risperiDONE  1.5 mg  Oral BID  . traZODone  150 mg Oral QHS  . venlafaxine XR  75 mg Oral Daily   Continuous Infusions:    LOS: 6 days    Time spent: 15 minutes    Kathie Dike, MD [facility]  If 7PM-7AM, please contact night-coverage [email or web address]  10/10/2018, 11:28 AM

## 2018-10-10 NOTE — Progress Notes (Signed)
EMS called again.  EMS operator again says that only one truck available and unable to give possible pick up time.  Spoke to family and updated family.

## 2018-10-10 NOTE — Progress Notes (Signed)
Patient resting in bed. Daughter has called to check on patient status and has been updated that EMS has not yet picked patient up but they have been notified. EMS states that they only have one truck and that's why it has taken so long today. Daughter asked to be updated when EMS arrives to pick up patient.

## 2018-10-10 NOTE — Progress Notes (Signed)
Patient discharged home in stable condition.  EMS came in to pick up patient.  Patient IV access removed, and family notified of patient being brought home.

## 2018-10-10 NOTE — Progress Notes (Signed)
EMS called for patient to be picked up and transported to the home. Daughter, [name], called to make aware- she stated that all the equipment is at the house.

## 2018-10-11 MED ORDER — DICYCLOMINE HCL 10 MG PO CAPS: 10.0000 mg | ORAL_CAPSULE | Freq: Three times a day (TID) | ORAL | 0 refills | Status: AC

## 2018-10-11 MED ORDER — HYDROCODONE-ACETAMINOPHEN 5-325 MG PO TABS: 1.0000 | ORAL_TABLET | Freq: Two times a day (BID) | ORAL | 0 refills | Status: DC | PRN

## 2018-10-11 MED ORDER — APIXABAN 5 MG PO TABS: ORAL_TABLET | ORAL | 1 refills | Status: AC

## 2018-10-12 ENCOUNTER — Telehealth: Payer: Self-pay | Admitting: Family Medicine

## 2018-10-12 ENCOUNTER — Encounter: Payer: Self-pay | Admitting: Family Medicine

## 2018-10-12 ENCOUNTER — Ambulatory Visit (INDEPENDENT_AMBULATORY_CARE_PROVIDER_SITE_OTHER): Payer: Medicare Other | Admitting: Family Medicine

## 2018-10-12 VITALS — Temp 99.5°F | Ht 65.0 in

## 2018-10-12 DIAGNOSIS — M545 Low back pain, unspecified: Secondary | ICD-10-CM

## 2018-10-12 DIAGNOSIS — I2699 Other pulmonary embolism without acute cor pulmonale: Secondary | ICD-10-CM

## 2018-10-12 DIAGNOSIS — Z7189 Other specified counseling: Secondary | ICD-10-CM

## 2018-10-12 DIAGNOSIS — B372 Candidiasis of skin and nail: Secondary | ICD-10-CM | POA: Insufficient documentation

## 2018-10-12 DIAGNOSIS — J9601 Acute respiratory failure with hypoxia: Secondary | ICD-10-CM | POA: Diagnosis not present

## 2018-10-12 DIAGNOSIS — R531 Weakness: Secondary | ICD-10-CM

## 2018-10-12 DIAGNOSIS — K219 Gastro-esophageal reflux disease without esophagitis: Secondary | ICD-10-CM | POA: Diagnosis not present

## 2018-10-12 DIAGNOSIS — J449 Chronic obstructive pulmonary disease, unspecified: Secondary | ICD-10-CM | POA: Diagnosis not present

## 2018-10-12 DIAGNOSIS — Z79891 Long term (current) use of opiate analgesic: Secondary | ICD-10-CM

## 2018-10-12 DIAGNOSIS — R627 Adult failure to thrive: Secondary | ICD-10-CM

## 2018-10-12 DIAGNOSIS — W19XXXS Unspecified fall, sequela: Secondary | ICD-10-CM

## 2018-10-12 DIAGNOSIS — E039 Hypothyroidism, unspecified: Secondary | ICD-10-CM | POA: Diagnosis not present

## 2018-10-12 DIAGNOSIS — J9600 Acute respiratory failure, unspecified whether with hypoxia or hypercapnia: Secondary | ICD-10-CM | POA: Diagnosis not present

## 2018-10-12 DIAGNOSIS — I824Z2 Acute embolism and thrombosis of unspecified deep veins of left distal lower extremity: Secondary | ICD-10-CM

## 2018-10-12 DIAGNOSIS — R630 Anorexia: Secondary | ICD-10-CM | POA: Diagnosis not present

## 2018-10-12 DIAGNOSIS — G8929 Other chronic pain: Secondary | ICD-10-CM

## 2018-10-12 DIAGNOSIS — F333 Major depressive disorder, recurrent, severe with psychotic symptoms: Secondary | ICD-10-CM

## 2018-10-12 DIAGNOSIS — M810 Age-related osteoporosis without current pathological fracture: Secondary | ICD-10-CM | POA: Diagnosis not present

## 2018-10-12 DIAGNOSIS — R52 Pain, unspecified: Secondary | ICD-10-CM | POA: Diagnosis not present

## 2018-10-12 DIAGNOSIS — G894 Chronic pain syndrome: Secondary | ICD-10-CM

## 2018-10-12 DIAGNOSIS — F339 Major depressive disorder, recurrent, unspecified: Secondary | ICD-10-CM | POA: Diagnosis not present

## 2018-10-12 MED ORDER — NYSTATIN 100000 UNIT/GM EX CREA: 1.0000 "application " | TOPICAL_CREAM | Freq: Two times a day (BID) | CUTANEOUS | 1 refills | Status: AC

## 2018-10-12 MED ORDER — LEVOTHYROXINE SODIUM 88 MCG PO TABS: 88.0000 ug | ORAL_TABLET | Freq: Every day | ORAL | 6 refills | Status: AC

## 2018-10-12 MED ORDER — HYDROCODONE-ACETAMINOPHEN 5-325 MG PO TABS: 1.0000 | ORAL_TABLET | Freq: Three times a day (TID) | ORAL | 0 refills | Status: AC | PRN

## 2018-10-12 NOTE — Assessment & Plan Note (Signed)
Acutely worse, previously pain regimen was hydrocodone/apap 7.5/325mg  QID through pain management (Dr Nelva Bush). Ongoing severe pain on current regimen which is hydrocodone 5/325mg  BID. Will increase to TID, #90 hydrocodone 5/325mg  sent to pharmacy today. Will establish with hospice per pt/family request.

## 2018-10-12 NOTE — Telephone Encounter (Signed)
Agree with this.  

## 2018-10-12 NOTE — Telephone Encounter (Signed)
Agree with below orders.  Agree with hospice referral - placed during OV today.

## 2018-10-12 NOTE — Progress Notes (Signed)
Virtual visit completed through [employer]. Due to national recommendations of social distancing due to COVID-19, a virtual visit is felt to be most appropriate for this patient at this time. Reviewed limitations of a virtual visit.   Patient location: daughter's home. [name] also present on call.  Provider location: Financial controller at [facility], office If any vitals were documented, they were collected by patient at home unless specified below.    Temp 99.5 F (37.5 C)    Ht 5\' 5"  (1.651 m)    LMP 07/15/2012    BMI 26.41 kg/m    CC: hosp f/u visit Subjective:    Patient ID: Jamie Pitts, female    DOB: [birth year], 85 y.o.   MRN: [id number]  HPI: Jamie Pitts is a 85 y.o. female presenting on 10/12/2018 for Hospitalization Follow-up (Per pt's daugher, [name], pt has decided to "let go". )   Had hospitalization at [facility] for psychosis 07/2018 - risperdal started at that time.   Recent hospitalization for FTT, generalized weakness, increased confusion and falls. Found to have bilateral PE's with RV strain as well as LLE DVT. Treated with IV heparin, transitioned to eliquis. Echo showed EF 60-65%. Discharged on 2L oxygen by [place]. Discharged home with palliative care. Tested negative for SARS-CoV-2.   Chronic narcotics for chronic back pain had recently been discontinued, restarted at low dose in the hospital. Last prescribed #10 hydrocodone 5/325mg  on 10/11/2018 from hospital.   For diarrhea during hospitalization - tested negative for C diff and GI pathogen panel. Thought IBS related.   Synthroid increased to 126mcg daily due to elevated TSH in hospital. Daughter has not yet picked up - this was sent to mail order.   H/o PD, depression with psychosis - continues risperdal, effexor, trazodone. Lamictal was stopped.   [facility] RN came out this morning. Requests hospice of [facility]. Pt is currently living with daughter. Pt is now bedridden, requests  hospice referral.   Has sacral sore as well as yeast infection at L thigh crease per HHRN eval today. Requests nystatin cream to local pharmacy.   Admit date: 10/04/2018 Discharge date: 10/09/2018 No TCM hosp f/u phone call performed but pt seen within 2 business days of discharge.  Admitted From: home Disposition:  home  Recommendations for Outpatient Follow-up:  1. Follow up with PCP in 1-2 weeks 2. Palliative care will follow the patient as an outpatient. If patient begins to deteriorate, plan will be to transition to hospice  Home Health:HH PT, RN, aide, social work Equipment/Devices: Hospital bed, commode, walker, oxygen at 2 L  Discharge Condition:stable CODE STATUS:DNR Diet recommendation: heart healthy     Relevant past medical, surgical, family and social history reviewed and updated as indicated. Interim medical history since our last visit reviewed. Allergies and medications reviewed and updated. Outpatient Medications Prior to Visit  Medication Sig Dispense Refill   apixaban (ELIQUIS) 5 MG TABS tablet Take 10mg  po bid for 7 days then 5mg  po bid 60 tablet 1   dicyclomine (BENTYL) 10 MG capsule Take 1 capsule (10 mg total) by mouth 3 (three) times daily before meals. 90 capsule 0   loperamide (IMODIUM A-D) 2 MG tablet Take 1 tablet (2 mg total) by mouth 4 (four) times daily as needed for diarrhea or loose stools. 60 tablet 0   naloxone (NARCAN) nasal spray 4 mg/0.1 mL Place 1 spray into the nose once.     risperiDONE (RISPERDAL) 3 MG tablet Take 0.5 tablets (1.5  mg total) by mouth 2 (two) times daily. 30 tablet 2   traZODone (DESYREL) 150 MG tablet Take 1 tablet (150 mg total) by mouth at bedtime. 30 tablet 2   venlafaxine XR (EFFEXOR XR) 75 MG 24 hr capsule Take 1 capsule (75 mg total) by mouth daily. 90 capsule 2   HYDROcodone-acetaminophen (NORCO/VICODIN) 5-325 MG tablet Take 1 tablet by mouth every 12 (twelve) hours as needed for moderate pain. 10 tablet 0    levothyroxine (SYNTHROID) 100 MCG tablet 1 tablet before breakfast daily except half tablet on Sundays 30 tablet 0   No facility-administered medications prior to visit.      Per HPI unless specifically indicated in ROS section below Review of Systems Objective:    Temp 99.5 F (37.5 C)    Ht 5\' 5"  (1.651 m)    LMP 07/15/2012    BMI 26.41 kg/m   Wt Readings from Last 3 Encounters:  10/06/18 158 lb 11.7 oz (72 kg)  09/28/18 171 lb (77.6 kg)  08/12/18 171 lb (77.6 kg)     Physical exam: Gen: laying in bed, unable to get up Pulm: weak voice     Assessment & Plan:   Problem List Items Addressed This Visit    Weakness   Multiple pulmonary emboli ([place]) - Primary    Found during recent hospitalization, with evidence of R heart strain. Now on eliquis - continue.       Relevant Orders   Ambulatory referral to Hospice   Long-term current use of opiate analgesic   Hypothyroidism    TSH elevated - will increase Synthroid to 10mcg daily - sent to local pharmacy (in [place] per daughter request).       Relevant Medications   levothyroxine (SYNTHROID) 88 MCG tablet   Goals of care, counseling/discussion    Discussed with pt/daughter - they desire to go hospice route to focus on comfort care at this time due to marked deterioration/bedridden status of patient.       FTT (failure to thrive) in adult   Relevant Orders   Ambulatory referral to Hospice   Fall   Depression, major, recurrent, severe with psychosis ([place])    Continue risperdal, effexor, trazodone.       Chronic pain syndrome    Acutely worse, previously pain regimen was hydrocodone/apap 7.5/325mg  QID through pain management (Dr Nelva Bush). Ongoing severe pain on current regimen which is hydrocodone 5/325mg  BID. Will increase to TID, #90 hydrocodone 5/325mg  sent to pharmacy today. Will establish with hospice per pt/family request.       Relevant Medications   HYDROcodone-acetaminophen (NORCO/VICODIN) 5-325 MG tablet    Chronic low back pain    Acute worsening, activity limiting.       Relevant Medications   HYDROcodone-acetaminophen (NORCO/VICODIN) 5-325 MG tablet   Candidal intertrigo    To L inner thigh crease - sent nystatin cream to start.       Relevant Medications   nystatin cream (MYCOSTATIN)   Acute respiratory failure with hypoxia ([place])    PE related. Now on eliquis.       Acute deep vein thrombosis (DVT) of left lower extremity ([place])    Now on eliquis.           Meds ordered this encounter  Medications   levothyroxine (SYNTHROID) 88 MCG tablet    Sig: Take 1 tablet (88 mcg total) by mouth daily before breakfast.    Dispense:  30 tablet    Refill:  6  HYDROcodone-acetaminophen (NORCO/VICODIN) 5-325 MG tablet    Sig: Take 1 tablet by mouth 3 (three) times daily as needed for moderate pain.    Dispense:  90 tablet    Refill:  0   nystatin cream (MYCOSTATIN)    Sig: Apply 1 application topically 2 (two) times daily.    Dispense:  80 g    Refill:  1   Orders Placed This Encounter  Procedures   Ambulatory referral to Hospice    Referral Priority:   Urgent    Referral Type:   Consultation    Referral Reason:   Specialty Services Required    Requested Specialty:   Hospice Services    Number of Visits Requested:   1    I discussed the assessment and treatment plan with the patient. The patient was provided an opportunity to ask questions and all were answered. The patient agreed with the plan and demonstrated an understanding of the instructions. The patient was advised to call back or seek an in-person evaluation if the symptoms worsen or if the condition fails to improve as anticipated.  Follow up plan: No follow-ups on file.  Ria Bush, MD

## 2018-10-12 NOTE — Assessment & Plan Note (Signed)
Discussed with pt/daughter - they desire to go hospice route to focus on comfort care at this time due to marked deterioration/bedridden status of patient.

## 2018-10-12 NOTE — Assessment & Plan Note (Signed)
Continue risperdal, effexor, trazodone.

## 2018-10-12 NOTE — Telephone Encounter (Signed)
Jamie Pitts called today to get verbal orders.  They would like an order for  Nystap Powered for 2x daily for her left groin area for yeast infection and left buttock since she is having a lot of diarrhea    Order for nursing  2 week 1 1 every other week 8   She is wanting a order for PT but does not know what frequency they will need at this time   Oral temp is 99.5, she stated they will continue to monitor temp.   [phone]

## 2018-10-12 NOTE — Telephone Encounter (Signed)
Jamie Pitts advised off all

## 2018-10-12 NOTE — Telephone Encounter (Signed)
Denise,[facility],called.  She requesting verbal orders for palliative care in the patient's home.  When returning Denise's call choose option 2.

## 2018-10-12 NOTE — Assessment & Plan Note (Signed)
Found during recent hospitalization, with evidence of R heart strain. Now on eliquis - continue.

## 2018-10-12 NOTE — Assessment & Plan Note (Signed)
Acute worsening, activity limiting.

## 2018-10-12 NOTE — Assessment & Plan Note (Signed)
PE related. Now on eliquis.

## 2018-10-12 NOTE — Assessment & Plan Note (Signed)
To L inner thigh crease - sent nystatin cream to start.

## 2018-10-12 NOTE — Telephone Encounter (Signed)
Verdis Frederickson @ [facility] number [phone]  Called back  The pt wants to go with [facility] phone [phone] She still needs orders per courtney's note

## 2018-10-12 NOTE — Assessment & Plan Note (Signed)
Now on eliquis.  ?

## 2018-10-12 NOTE — Assessment & Plan Note (Signed)
TSH elevated - will increase Synthroid to 15mcg daily - sent to local pharmacy (in [place] per daughter request).

## 2018-10-13 ENCOUNTER — Ambulatory Visit: Payer: Medicare Other | Admitting: Family Medicine

## 2018-10-13 DIAGNOSIS — E039 Hypothyroidism, unspecified: Secondary | ICD-10-CM | POA: Diagnosis not present

## 2018-10-13 DIAGNOSIS — M810 Age-related osteoporosis without current pathological fracture: Secondary | ICD-10-CM | POA: Diagnosis not present

## 2018-10-13 DIAGNOSIS — K219 Gastro-esophageal reflux disease without esophagitis: Secondary | ICD-10-CM | POA: Diagnosis not present

## 2018-10-13 DIAGNOSIS — F339 Major depressive disorder, recurrent, unspecified: Secondary | ICD-10-CM | POA: Diagnosis not present

## 2018-10-13 DIAGNOSIS — I2699 Other pulmonary embolism without acute cor pulmonale: Secondary | ICD-10-CM | POA: Diagnosis not present

## 2018-10-13 DIAGNOSIS — J9600 Acute respiratory failure, unspecified whether with hypoxia or hypercapnia: Secondary | ICD-10-CM | POA: Diagnosis not present

## 2018-10-13 NOTE — Telephone Encounter (Signed)
Spoke with Langley Gauss informing her Dr. Darnell Level is giving verbal order for services requested.  Verbalizes understanding.

## 2018-10-15 DIAGNOSIS — J9600 Acute respiratory failure, unspecified whether with hypoxia or hypercapnia: Secondary | ICD-10-CM | POA: Diagnosis not present

## 2018-10-15 DIAGNOSIS — F339 Major depressive disorder, recurrent, unspecified: Secondary | ICD-10-CM | POA: Diagnosis not present

## 2018-10-15 DIAGNOSIS — M810 Age-related osteoporosis without current pathological fracture: Secondary | ICD-10-CM | POA: Diagnosis not present

## 2018-10-15 DIAGNOSIS — K219 Gastro-esophageal reflux disease without esophagitis: Secondary | ICD-10-CM | POA: Diagnosis not present

## 2018-10-15 DIAGNOSIS — E039 Hypothyroidism, unspecified: Secondary | ICD-10-CM | POA: Diagnosis not present

## 2018-10-15 DIAGNOSIS — I2699 Other pulmonary embolism without acute cor pulmonale: Secondary | ICD-10-CM | POA: Diagnosis not present

## 2018-10-16 DIAGNOSIS — K219 Gastro-esophageal reflux disease without esophagitis: Secondary | ICD-10-CM | POA: Diagnosis not present

## 2018-10-16 DIAGNOSIS — F339 Major depressive disorder, recurrent, unspecified: Secondary | ICD-10-CM | POA: Diagnosis not present

## 2018-10-16 DIAGNOSIS — J9600 Acute respiratory failure, unspecified whether with hypoxia or hypercapnia: Secondary | ICD-10-CM | POA: Diagnosis not present

## 2018-10-16 DIAGNOSIS — M810 Age-related osteoporosis without current pathological fracture: Secondary | ICD-10-CM | POA: Diagnosis not present

## 2018-10-16 DIAGNOSIS — I2699 Other pulmonary embolism without acute cor pulmonale: Secondary | ICD-10-CM | POA: Diagnosis not present

## 2018-10-16 DIAGNOSIS — E039 Hypothyroidism, unspecified: Secondary | ICD-10-CM | POA: Diagnosis not present

## 2018-10-19 DIAGNOSIS — K219 Gastro-esophageal reflux disease without esophagitis: Secondary | ICD-10-CM | POA: Diagnosis not present

## 2018-10-19 DIAGNOSIS — J9600 Acute respiratory failure, unspecified whether with hypoxia or hypercapnia: Secondary | ICD-10-CM | POA: Diagnosis not present

## 2018-10-19 DIAGNOSIS — F339 Major depressive disorder, recurrent, unspecified: Secondary | ICD-10-CM | POA: Diagnosis not present

## 2018-10-19 DIAGNOSIS — M810 Age-related osteoporosis without current pathological fracture: Secondary | ICD-10-CM | POA: Diagnosis not present

## 2018-10-19 DIAGNOSIS — E039 Hypothyroidism, unspecified: Secondary | ICD-10-CM | POA: Diagnosis not present

## 2018-10-19 DIAGNOSIS — I2699 Other pulmonary embolism without acute cor pulmonale: Secondary | ICD-10-CM | POA: Diagnosis not present

## 2018-10-20 DIAGNOSIS — K5900 Constipation, unspecified: Secondary | ICD-10-CM | POA: Diagnosis not present

## 2018-10-20 DIAGNOSIS — R682 Dry mouth, unspecified: Secondary | ICD-10-CM | POA: Diagnosis not present

## 2018-10-20 DIAGNOSIS — R52 Pain, unspecified: Secondary | ICD-10-CM | POA: Diagnosis not present

## 2018-10-20 DIAGNOSIS — R630 Anorexia: Secondary | ICD-10-CM | POA: Diagnosis not present

## 2018-10-20 DIAGNOSIS — E039 Hypothyroidism, unspecified: Secondary | ICD-10-CM | POA: Diagnosis not present

## 2018-10-20 DIAGNOSIS — J9600 Acute respiratory failure, unspecified whether with hypoxia or hypercapnia: Secondary | ICD-10-CM | POA: Diagnosis not present

## 2018-10-20 DIAGNOSIS — K219 Gastro-esophageal reflux disease without esophagitis: Secondary | ICD-10-CM | POA: Diagnosis not present

## 2018-10-20 DIAGNOSIS — J449 Chronic obstructive pulmonary disease, unspecified: Secondary | ICD-10-CM | POA: Diagnosis not present

## 2018-10-20 DIAGNOSIS — F339 Major depressive disorder, recurrent, unspecified: Secondary | ICD-10-CM | POA: Diagnosis not present

## 2018-10-20 DIAGNOSIS — R531 Weakness: Secondary | ICD-10-CM | POA: Diagnosis not present

## 2018-10-20 DIAGNOSIS — R197 Diarrhea, unspecified: Secondary | ICD-10-CM | POA: Diagnosis not present

## 2018-10-20 DIAGNOSIS — M810 Age-related osteoporosis without current pathological fracture: Secondary | ICD-10-CM | POA: Diagnosis not present

## 2018-10-20 DIAGNOSIS — I2699 Other pulmonary embolism without acute cor pulmonale: Secondary | ICD-10-CM | POA: Diagnosis not present

## 2018-10-22 DIAGNOSIS — E039 Hypothyroidism, unspecified: Secondary | ICD-10-CM | POA: Diagnosis not present

## 2018-10-22 DIAGNOSIS — K219 Gastro-esophageal reflux disease without esophagitis: Secondary | ICD-10-CM | POA: Diagnosis not present

## 2018-10-22 DIAGNOSIS — I2699 Other pulmonary embolism without acute cor pulmonale: Secondary | ICD-10-CM | POA: Diagnosis not present

## 2018-10-22 DIAGNOSIS — F339 Major depressive disorder, recurrent, unspecified: Secondary | ICD-10-CM | POA: Diagnosis not present

## 2018-10-22 DIAGNOSIS — M810 Age-related osteoporosis without current pathological fracture: Secondary | ICD-10-CM | POA: Diagnosis not present

## 2018-10-22 DIAGNOSIS — J9600 Acute respiratory failure, unspecified whether with hypoxia or hypercapnia: Secondary | ICD-10-CM | POA: Diagnosis not present

## 2018-10-23 DIAGNOSIS — F339 Major depressive disorder, recurrent, unspecified: Secondary | ICD-10-CM | POA: Diagnosis not present

## 2018-10-23 DIAGNOSIS — M810 Age-related osteoporosis without current pathological fracture: Secondary | ICD-10-CM | POA: Diagnosis not present

## 2018-10-23 DIAGNOSIS — I2699 Other pulmonary embolism without acute cor pulmonale: Secondary | ICD-10-CM | POA: Diagnosis not present

## 2018-10-23 DIAGNOSIS — E039 Hypothyroidism, unspecified: Secondary | ICD-10-CM | POA: Diagnosis not present

## 2018-10-23 DIAGNOSIS — K219 Gastro-esophageal reflux disease without esophagitis: Secondary | ICD-10-CM | POA: Diagnosis not present

## 2018-10-23 DIAGNOSIS — J9600 Acute respiratory failure, unspecified whether with hypoxia or hypercapnia: Secondary | ICD-10-CM | POA: Diagnosis not present

## 2018-10-27 DIAGNOSIS — J9600 Acute respiratory failure, unspecified whether with hypoxia or hypercapnia: Secondary | ICD-10-CM | POA: Diagnosis not present

## 2018-10-27 DIAGNOSIS — K219 Gastro-esophageal reflux disease without esophagitis: Secondary | ICD-10-CM | POA: Diagnosis not present

## 2018-10-27 DIAGNOSIS — I2699 Other pulmonary embolism without acute cor pulmonale: Secondary | ICD-10-CM | POA: Diagnosis not present

## 2018-10-27 DIAGNOSIS — E039 Hypothyroidism, unspecified: Secondary | ICD-10-CM | POA: Diagnosis not present

## 2018-10-27 DIAGNOSIS — F339 Major depressive disorder, recurrent, unspecified: Secondary | ICD-10-CM | POA: Diagnosis not present

## 2018-10-27 DIAGNOSIS — M810 Age-related osteoporosis without current pathological fracture: Secondary | ICD-10-CM | POA: Diagnosis not present

## 2018-10-28 DIAGNOSIS — M810 Age-related osteoporosis without current pathological fracture: Secondary | ICD-10-CM | POA: Diagnosis not present

## 2018-10-28 DIAGNOSIS — J9600 Acute respiratory failure, unspecified whether with hypoxia or hypercapnia: Secondary | ICD-10-CM | POA: Diagnosis not present

## 2018-10-28 DIAGNOSIS — I2699 Other pulmonary embolism without acute cor pulmonale: Secondary | ICD-10-CM | POA: Diagnosis not present

## 2018-10-28 DIAGNOSIS — E039 Hypothyroidism, unspecified: Secondary | ICD-10-CM | POA: Diagnosis not present

## 2018-10-28 DIAGNOSIS — F339 Major depressive disorder, recurrent, unspecified: Secondary | ICD-10-CM | POA: Diagnosis not present

## 2018-10-28 DIAGNOSIS — K219 Gastro-esophageal reflux disease without esophagitis: Secondary | ICD-10-CM | POA: Diagnosis not present

## 2018-10-29 DIAGNOSIS — K219 Gastro-esophageal reflux disease without esophagitis: Secondary | ICD-10-CM | POA: Diagnosis not present

## 2018-10-29 DIAGNOSIS — E039 Hypothyroidism, unspecified: Secondary | ICD-10-CM | POA: Diagnosis not present

## 2018-10-29 DIAGNOSIS — F339 Major depressive disorder, recurrent, unspecified: Secondary | ICD-10-CM | POA: Diagnosis not present

## 2018-10-29 DIAGNOSIS — J9600 Acute respiratory failure, unspecified whether with hypoxia or hypercapnia: Secondary | ICD-10-CM | POA: Diagnosis not present

## 2018-10-29 DIAGNOSIS — I2699 Other pulmonary embolism without acute cor pulmonale: Secondary | ICD-10-CM | POA: Diagnosis not present

## 2018-10-29 DIAGNOSIS — M810 Age-related osteoporosis without current pathological fracture: Secondary | ICD-10-CM | POA: Diagnosis not present

## 2018-11-03 DIAGNOSIS — K219 Gastro-esophageal reflux disease without esophagitis: Secondary | ICD-10-CM | POA: Diagnosis not present

## 2018-11-03 DIAGNOSIS — I2699 Other pulmonary embolism without acute cor pulmonale: Secondary | ICD-10-CM | POA: Diagnosis not present

## 2018-11-03 DIAGNOSIS — F339 Major depressive disorder, recurrent, unspecified: Secondary | ICD-10-CM | POA: Diagnosis not present

## 2018-11-03 DIAGNOSIS — J9600 Acute respiratory failure, unspecified whether with hypoxia or hypercapnia: Secondary | ICD-10-CM | POA: Diagnosis not present

## 2018-11-03 DIAGNOSIS — E039 Hypothyroidism, unspecified: Secondary | ICD-10-CM | POA: Diagnosis not present

## 2018-11-03 DIAGNOSIS — M810 Age-related osteoporosis without current pathological fracture: Secondary | ICD-10-CM | POA: Diagnosis not present

## 2018-11-04 DIAGNOSIS — M810 Age-related osteoporosis without current pathological fracture: Secondary | ICD-10-CM | POA: Diagnosis not present

## 2018-11-04 DIAGNOSIS — F339 Major depressive disorder, recurrent, unspecified: Secondary | ICD-10-CM | POA: Diagnosis not present

## 2018-11-04 DIAGNOSIS — I2699 Other pulmonary embolism without acute cor pulmonale: Secondary | ICD-10-CM | POA: Diagnosis not present

## 2018-11-04 DIAGNOSIS — E039 Hypothyroidism, unspecified: Secondary | ICD-10-CM | POA: Diagnosis not present

## 2018-11-04 DIAGNOSIS — K219 Gastro-esophageal reflux disease without esophagitis: Secondary | ICD-10-CM | POA: Diagnosis not present

## 2018-11-04 DIAGNOSIS — J9600 Acute respiratory failure, unspecified whether with hypoxia or hypercapnia: Secondary | ICD-10-CM | POA: Diagnosis not present

## 2018-11-05 DIAGNOSIS — K219 Gastro-esophageal reflux disease without esophagitis: Secondary | ICD-10-CM | POA: Diagnosis not present

## 2018-11-05 DIAGNOSIS — I2699 Other pulmonary embolism without acute cor pulmonale: Secondary | ICD-10-CM | POA: Diagnosis not present

## 2018-11-05 DIAGNOSIS — J9600 Acute respiratory failure, unspecified whether with hypoxia or hypercapnia: Secondary | ICD-10-CM | POA: Diagnosis not present

## 2018-11-05 DIAGNOSIS — M810 Age-related osteoporosis without current pathological fracture: Secondary | ICD-10-CM | POA: Diagnosis not present

## 2018-11-05 DIAGNOSIS — F339 Major depressive disorder, recurrent, unspecified: Secondary | ICD-10-CM | POA: Diagnosis not present

## 2018-11-05 DIAGNOSIS — E039 Hypothyroidism, unspecified: Secondary | ICD-10-CM | POA: Diagnosis not present

## 2018-11-06 DIAGNOSIS — K219 Gastro-esophageal reflux disease without esophagitis: Secondary | ICD-10-CM | POA: Diagnosis not present

## 2018-11-06 DIAGNOSIS — F339 Major depressive disorder, recurrent, unspecified: Secondary | ICD-10-CM | POA: Diagnosis not present

## 2018-11-06 DIAGNOSIS — I2699 Other pulmonary embolism without acute cor pulmonale: Secondary | ICD-10-CM | POA: Diagnosis not present

## 2018-11-06 DIAGNOSIS — J9600 Acute respiratory failure, unspecified whether with hypoxia or hypercapnia: Secondary | ICD-10-CM | POA: Diagnosis not present

## 2018-11-06 DIAGNOSIS — E039 Hypothyroidism, unspecified: Secondary | ICD-10-CM | POA: Diagnosis not present

## 2018-11-06 DIAGNOSIS — M810 Age-related osteoporosis without current pathological fracture: Secondary | ICD-10-CM | POA: Diagnosis not present

## 2018-11-07 DIAGNOSIS — F339 Major depressive disorder, recurrent, unspecified: Secondary | ICD-10-CM | POA: Diagnosis not present

## 2018-11-07 DIAGNOSIS — K219 Gastro-esophageal reflux disease without esophagitis: Secondary | ICD-10-CM | POA: Diagnosis not present

## 2018-11-07 DIAGNOSIS — I2699 Other pulmonary embolism without acute cor pulmonale: Secondary | ICD-10-CM | POA: Diagnosis not present

## 2018-11-07 DIAGNOSIS — J9600 Acute respiratory failure, unspecified whether with hypoxia or hypercapnia: Secondary | ICD-10-CM | POA: Diagnosis not present

## 2018-11-07 DIAGNOSIS — E039 Hypothyroidism, unspecified: Secondary | ICD-10-CM | POA: Diagnosis not present

## 2018-11-07 DIAGNOSIS — M810 Age-related osteoporosis without current pathological fracture: Secondary | ICD-10-CM | POA: Diagnosis not present

## 2018-11-08 DIAGNOSIS — E039 Hypothyroidism, unspecified: Secondary | ICD-10-CM | POA: Diagnosis not present

## 2018-11-08 DIAGNOSIS — K219 Gastro-esophageal reflux disease without esophagitis: Secondary | ICD-10-CM | POA: Diagnosis not present

## 2018-11-08 DIAGNOSIS — I2699 Other pulmonary embolism without acute cor pulmonale: Secondary | ICD-10-CM | POA: Diagnosis not present

## 2018-11-08 DIAGNOSIS — M810 Age-related osteoporosis without current pathological fracture: Secondary | ICD-10-CM | POA: Diagnosis not present

## 2018-11-08 DIAGNOSIS — F339 Major depressive disorder, recurrent, unspecified: Secondary | ICD-10-CM | POA: Diagnosis not present

## 2018-11-08 DIAGNOSIS — J9600 Acute respiratory failure, unspecified whether with hypoxia or hypercapnia: Secondary | ICD-10-CM | POA: Diagnosis not present

## 2018-11-09 DIAGNOSIS — M810 Age-related osteoporosis without current pathological fracture: Secondary | ICD-10-CM | POA: Diagnosis not present

## 2018-11-09 DIAGNOSIS — J9600 Acute respiratory failure, unspecified whether with hypoxia or hypercapnia: Secondary | ICD-10-CM | POA: Diagnosis not present

## 2018-11-09 DIAGNOSIS — E039 Hypothyroidism, unspecified: Secondary | ICD-10-CM | POA: Diagnosis not present

## 2018-11-09 DIAGNOSIS — K219 Gastro-esophageal reflux disease without esophagitis: Secondary | ICD-10-CM | POA: Diagnosis not present

## 2018-11-09 DIAGNOSIS — I2699 Other pulmonary embolism without acute cor pulmonale: Secondary | ICD-10-CM | POA: Diagnosis not present

## 2018-11-09 DIAGNOSIS — F339 Major depressive disorder, recurrent, unspecified: Secondary | ICD-10-CM | POA: Diagnosis not present

## 2018-11-10 DIAGNOSIS — J9600 Acute respiratory failure, unspecified whether with hypoxia or hypercapnia: Secondary | ICD-10-CM | POA: Diagnosis not present

## 2018-11-10 DIAGNOSIS — K219 Gastro-esophageal reflux disease without esophagitis: Secondary | ICD-10-CM | POA: Diagnosis not present

## 2018-11-10 DIAGNOSIS — M810 Age-related osteoporosis without current pathological fracture: Secondary | ICD-10-CM | POA: Diagnosis not present

## 2018-11-10 DIAGNOSIS — F339 Major depressive disorder, recurrent, unspecified: Secondary | ICD-10-CM | POA: Diagnosis not present

## 2018-11-10 DIAGNOSIS — E039 Hypothyroidism, unspecified: Secondary | ICD-10-CM | POA: Diagnosis not present

## 2018-11-10 DIAGNOSIS — I2699 Other pulmonary embolism without acute cor pulmonale: Secondary | ICD-10-CM | POA: Diagnosis not present

## 2018-11-11 DIAGNOSIS — J9600 Acute respiratory failure, unspecified whether with hypoxia or hypercapnia: Secondary | ICD-10-CM | POA: Diagnosis not present

## 2018-11-11 DIAGNOSIS — E039 Hypothyroidism, unspecified: Secondary | ICD-10-CM | POA: Diagnosis not present

## 2018-11-11 DIAGNOSIS — M810 Age-related osteoporosis without current pathological fracture: Secondary | ICD-10-CM | POA: Diagnosis not present

## 2018-11-11 DIAGNOSIS — F339 Major depressive disorder, recurrent, unspecified: Secondary | ICD-10-CM | POA: Diagnosis not present

## 2018-11-11 DIAGNOSIS — K219 Gastro-esophageal reflux disease without esophagitis: Secondary | ICD-10-CM | POA: Diagnosis not present

## 2018-11-11 DIAGNOSIS — I2699 Other pulmonary embolism without acute cor pulmonale: Secondary | ICD-10-CM | POA: Diagnosis not present

## 2018-11-12 ENCOUNTER — Other Ambulatory Visit: Payer: Medicare Other

## 2018-11-12 DIAGNOSIS — F339 Major depressive disorder, recurrent, unspecified: Secondary | ICD-10-CM | POA: Diagnosis not present

## 2018-11-12 DIAGNOSIS — E039 Hypothyroidism, unspecified: Secondary | ICD-10-CM | POA: Diagnosis not present

## 2018-11-12 DIAGNOSIS — J9600 Acute respiratory failure, unspecified whether with hypoxia or hypercapnia: Secondary | ICD-10-CM | POA: Diagnosis not present

## 2018-11-12 DIAGNOSIS — K219 Gastro-esophageal reflux disease without esophagitis: Secondary | ICD-10-CM | POA: Diagnosis not present

## 2018-11-12 DIAGNOSIS — I2699 Other pulmonary embolism without acute cor pulmonale: Secondary | ICD-10-CM | POA: Diagnosis not present

## 2018-11-12 DIAGNOSIS — M810 Age-related osteoporosis without current pathological fracture: Secondary | ICD-10-CM | POA: Diagnosis not present

## 2018-11-13 DIAGNOSIS — F339 Major depressive disorder, recurrent, unspecified: Secondary | ICD-10-CM | POA: Diagnosis not present

## 2018-11-13 DIAGNOSIS — E039 Hypothyroidism, unspecified: Secondary | ICD-10-CM | POA: Diagnosis not present

## 2018-11-13 DIAGNOSIS — I2699 Other pulmonary embolism without acute cor pulmonale: Secondary | ICD-10-CM | POA: Diagnosis not present

## 2018-11-13 DIAGNOSIS — K219 Gastro-esophageal reflux disease without esophagitis: Secondary | ICD-10-CM | POA: Diagnosis not present

## 2018-11-13 DIAGNOSIS — M810 Age-related osteoporosis without current pathological fracture: Secondary | ICD-10-CM | POA: Diagnosis not present

## 2018-11-13 DIAGNOSIS — J9600 Acute respiratory failure, unspecified whether with hypoxia or hypercapnia: Secondary | ICD-10-CM | POA: Diagnosis not present

## 2018-11-14 DIAGNOSIS — E039 Hypothyroidism, unspecified: Secondary | ICD-10-CM | POA: Diagnosis not present

## 2018-11-14 DIAGNOSIS — F339 Major depressive disorder, recurrent, unspecified: Secondary | ICD-10-CM | POA: Diagnosis not present

## 2018-11-14 DIAGNOSIS — J9600 Acute respiratory failure, unspecified whether with hypoxia or hypercapnia: Secondary | ICD-10-CM | POA: Diagnosis not present

## 2018-11-14 DIAGNOSIS — I2699 Other pulmonary embolism without acute cor pulmonale: Secondary | ICD-10-CM | POA: Diagnosis not present

## 2018-11-14 DIAGNOSIS — M810 Age-related osteoporosis without current pathological fracture: Secondary | ICD-10-CM | POA: Diagnosis not present

## 2018-11-14 DIAGNOSIS — K219 Gastro-esophageal reflux disease without esophagitis: Secondary | ICD-10-CM | POA: Diagnosis not present

## 2018-11-15 DIAGNOSIS — F339 Major depressive disorder, recurrent, unspecified: Secondary | ICD-10-CM | POA: Diagnosis not present

## 2018-11-15 DIAGNOSIS — E039 Hypothyroidism, unspecified: Secondary | ICD-10-CM | POA: Diagnosis not present

## 2018-11-15 DIAGNOSIS — I2699 Other pulmonary embolism without acute cor pulmonale: Secondary | ICD-10-CM | POA: Diagnosis not present

## 2018-11-15 DIAGNOSIS — J9600 Acute respiratory failure, unspecified whether with hypoxia or hypercapnia: Secondary | ICD-10-CM | POA: Diagnosis not present

## 2018-11-15 DIAGNOSIS — M810 Age-related osteoporosis without current pathological fracture: Secondary | ICD-10-CM | POA: Diagnosis not present

## 2018-11-15 DIAGNOSIS — K219 Gastro-esophageal reflux disease without esophagitis: Secondary | ICD-10-CM | POA: Diagnosis not present

## 2018-11-16 ENCOUNTER — Ambulatory Visit: Payer: Self-pay | Admitting: Endocrinology

## 2018-11-16 DIAGNOSIS — F339 Major depressive disorder, recurrent, unspecified: Secondary | ICD-10-CM | POA: Diagnosis not present

## 2018-11-16 DIAGNOSIS — E039 Hypothyroidism, unspecified: Secondary | ICD-10-CM | POA: Diagnosis not present

## 2018-11-16 DIAGNOSIS — J9600 Acute respiratory failure, unspecified whether with hypoxia or hypercapnia: Secondary | ICD-10-CM | POA: Diagnosis not present

## 2018-11-16 DIAGNOSIS — K219 Gastro-esophageal reflux disease without esophagitis: Secondary | ICD-10-CM | POA: Diagnosis not present

## 2018-11-16 DIAGNOSIS — I2699 Other pulmonary embolism without acute cor pulmonale: Secondary | ICD-10-CM | POA: Diagnosis not present

## 2018-11-16 DIAGNOSIS — M810 Age-related osteoporosis without current pathological fracture: Secondary | ICD-10-CM | POA: Diagnosis not present

## 2018-11-17 DIAGNOSIS — E039 Hypothyroidism, unspecified: Secondary | ICD-10-CM | POA: Diagnosis not present

## 2018-11-17 DIAGNOSIS — F339 Major depressive disorder, recurrent, unspecified: Secondary | ICD-10-CM | POA: Diagnosis not present

## 2018-11-17 DIAGNOSIS — I2699 Other pulmonary embolism without acute cor pulmonale: Secondary | ICD-10-CM | POA: Diagnosis not present

## 2018-11-17 DIAGNOSIS — K219 Gastro-esophageal reflux disease without esophagitis: Secondary | ICD-10-CM | POA: Diagnosis not present

## 2018-11-17 DIAGNOSIS — M810 Age-related osteoporosis without current pathological fracture: Secondary | ICD-10-CM | POA: Diagnosis not present

## 2018-11-17 DIAGNOSIS — J9600 Acute respiratory failure, unspecified whether with hypoxia or hypercapnia: Secondary | ICD-10-CM | POA: Diagnosis not present

## 2018-11-18 DIAGNOSIS — K219 Gastro-esophageal reflux disease without esophagitis: Secondary | ICD-10-CM | POA: Diagnosis not present

## 2018-11-18 DIAGNOSIS — M810 Age-related osteoporosis without current pathological fracture: Secondary | ICD-10-CM | POA: Diagnosis not present

## 2018-11-18 DIAGNOSIS — I2699 Other pulmonary embolism without acute cor pulmonale: Secondary | ICD-10-CM | POA: Diagnosis not present

## 2018-11-18 DIAGNOSIS — J9600 Acute respiratory failure, unspecified whether with hypoxia or hypercapnia: Secondary | ICD-10-CM | POA: Diagnosis not present

## 2018-11-18 DIAGNOSIS — F339 Major depressive disorder, recurrent, unspecified: Secondary | ICD-10-CM | POA: Diagnosis not present

## 2018-11-18 DIAGNOSIS — E039 Hypothyroidism, unspecified: Secondary | ICD-10-CM | POA: Diagnosis not present

## 2018-11-19 DIAGNOSIS — R531 Weakness: Secondary | ICD-10-CM | POA: Diagnosis not present

## 2018-11-19 DIAGNOSIS — E039 Hypothyroidism, unspecified: Secondary | ICD-10-CM | POA: Diagnosis not present

## 2018-11-19 DIAGNOSIS — R52 Pain, unspecified: Secondary | ICD-10-CM | POA: Diagnosis not present

## 2018-11-19 DIAGNOSIS — F339 Major depressive disorder, recurrent, unspecified: Secondary | ICD-10-CM | POA: Diagnosis not present

## 2018-11-19 DIAGNOSIS — R197 Diarrhea, unspecified: Secondary | ICD-10-CM | POA: Diagnosis not present

## 2018-11-19 DIAGNOSIS — R682 Dry mouth, unspecified: Secondary | ICD-10-CM | POA: Diagnosis not present

## 2018-11-19 DIAGNOSIS — I2699 Other pulmonary embolism without acute cor pulmonale: Secondary | ICD-10-CM | POA: Diagnosis not present

## 2018-11-19 DIAGNOSIS — K219 Gastro-esophageal reflux disease without esophagitis: Secondary | ICD-10-CM | POA: Diagnosis not present

## 2018-11-19 DIAGNOSIS — R630 Anorexia: Secondary | ICD-10-CM | POA: Diagnosis not present

## 2018-11-19 DIAGNOSIS — J449 Chronic obstructive pulmonary disease, unspecified: Secondary | ICD-10-CM | POA: Diagnosis not present

## 2018-11-19 DIAGNOSIS — K5900 Constipation, unspecified: Secondary | ICD-10-CM | POA: Diagnosis not present

## 2018-11-19 DIAGNOSIS — E46 Unspecified protein-calorie malnutrition: Secondary | ICD-10-CM | POA: Diagnosis not present

## 2018-11-19 DIAGNOSIS — M810 Age-related osteoporosis without current pathological fracture: Secondary | ICD-10-CM | POA: Diagnosis not present

## 2018-11-20 DIAGNOSIS — F339 Major depressive disorder, recurrent, unspecified: Secondary | ICD-10-CM | POA: Diagnosis not present

## 2018-11-20 DIAGNOSIS — M810 Age-related osteoporosis without current pathological fracture: Secondary | ICD-10-CM | POA: Diagnosis not present

## 2018-11-20 DIAGNOSIS — K219 Gastro-esophageal reflux disease without esophagitis: Secondary | ICD-10-CM | POA: Diagnosis not present

## 2018-11-20 DIAGNOSIS — E46 Unspecified protein-calorie malnutrition: Secondary | ICD-10-CM | POA: Diagnosis not present

## 2018-11-20 DIAGNOSIS — I2699 Other pulmonary embolism without acute cor pulmonale: Secondary | ICD-10-CM | POA: Diagnosis not present

## 2018-11-20 DIAGNOSIS — E039 Hypothyroidism, unspecified: Secondary | ICD-10-CM | POA: Diagnosis not present

## 2018-11-21 DIAGNOSIS — M810 Age-related osteoporosis without current pathological fracture: Secondary | ICD-10-CM | POA: Diagnosis not present

## 2018-11-21 DIAGNOSIS — F339 Major depressive disorder, recurrent, unspecified: Secondary | ICD-10-CM | POA: Diagnosis not present

## 2018-11-21 DIAGNOSIS — E46 Unspecified protein-calorie malnutrition: Secondary | ICD-10-CM | POA: Diagnosis not present

## 2018-11-21 DIAGNOSIS — E039 Hypothyroidism, unspecified: Secondary | ICD-10-CM | POA: Diagnosis not present

## 2018-11-21 DIAGNOSIS — I2699 Other pulmonary embolism without acute cor pulmonale: Secondary | ICD-10-CM | POA: Diagnosis not present

## 2018-11-21 DIAGNOSIS — K219 Gastro-esophageal reflux disease without esophagitis: Secondary | ICD-10-CM | POA: Diagnosis not present

## 2018-11-22 DIAGNOSIS — K219 Gastro-esophageal reflux disease without esophagitis: Secondary | ICD-10-CM | POA: Diagnosis not present

## 2018-11-22 DIAGNOSIS — F339 Major depressive disorder, recurrent, unspecified: Secondary | ICD-10-CM | POA: Diagnosis not present

## 2018-11-22 DIAGNOSIS — E039 Hypothyroidism, unspecified: Secondary | ICD-10-CM | POA: Diagnosis not present

## 2018-11-22 DIAGNOSIS — E46 Unspecified protein-calorie malnutrition: Secondary | ICD-10-CM | POA: Diagnosis not present

## 2018-11-22 DIAGNOSIS — M810 Age-related osteoporosis without current pathological fracture: Secondary | ICD-10-CM | POA: Diagnosis not present

## 2018-11-22 DIAGNOSIS — I2699 Other pulmonary embolism without acute cor pulmonale: Secondary | ICD-10-CM | POA: Diagnosis not present

## 2018-11-23 DIAGNOSIS — F339 Major depressive disorder, recurrent, unspecified: Secondary | ICD-10-CM | POA: Diagnosis not present

## 2018-11-23 DIAGNOSIS — K219 Gastro-esophageal reflux disease without esophagitis: Secondary | ICD-10-CM | POA: Diagnosis not present

## 2018-11-23 DIAGNOSIS — E46 Unspecified protein-calorie malnutrition: Secondary | ICD-10-CM | POA: Diagnosis not present

## 2018-11-23 DIAGNOSIS — E039 Hypothyroidism, unspecified: Secondary | ICD-10-CM | POA: Diagnosis not present

## 2018-11-23 DIAGNOSIS — M810 Age-related osteoporosis without current pathological fracture: Secondary | ICD-10-CM | POA: Diagnosis not present

## 2018-11-23 DIAGNOSIS — I2699 Other pulmonary embolism without acute cor pulmonale: Secondary | ICD-10-CM | POA: Diagnosis not present

## 2018-11-27 ENCOUNTER — Telehealth: Payer: Self-pay | Admitting: Family Medicine

## 2018-11-27 NOTE — Telephone Encounter (Addendum)
Received notice of death from [facility] that pt passed away at hospice home Dec 22, 2018.  Called [name] to express my condolences, no answer. Will try again later.

## 2018-12-20 DEATH — deceased

## 2019-03-05 NOTE — Telephone Encounter (Signed)
plz change patient status as she passed away. Thank you.

## 2019-11-08 IMAGING — DX DG ABDOMEN 2V
3 series · 3 of 3 positions shown · non-contrast
Comparison: CT abdomen pelvis 03/31/2017.

CLINICAL DATA: Difficulty urinating.

EXAM:
ABDOMEN - 2 VIEW

[abdomen erect]
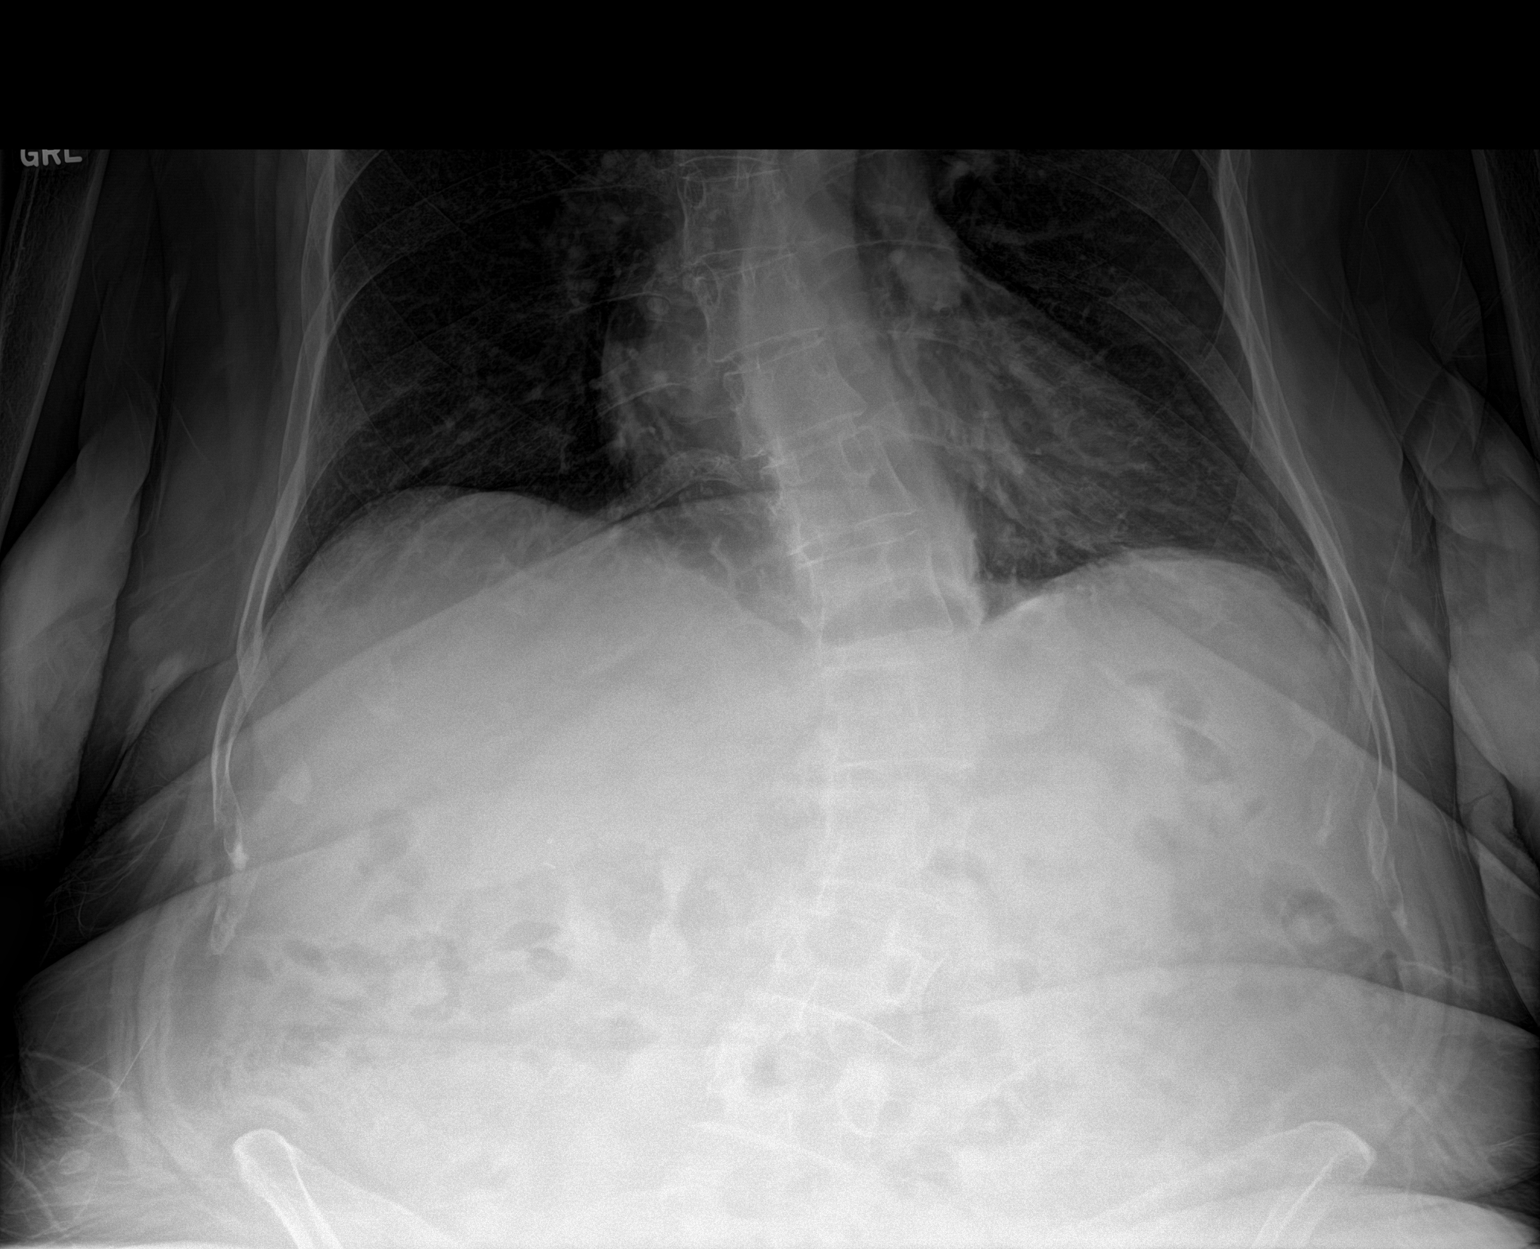

[abdomen supine (1 of 2)]
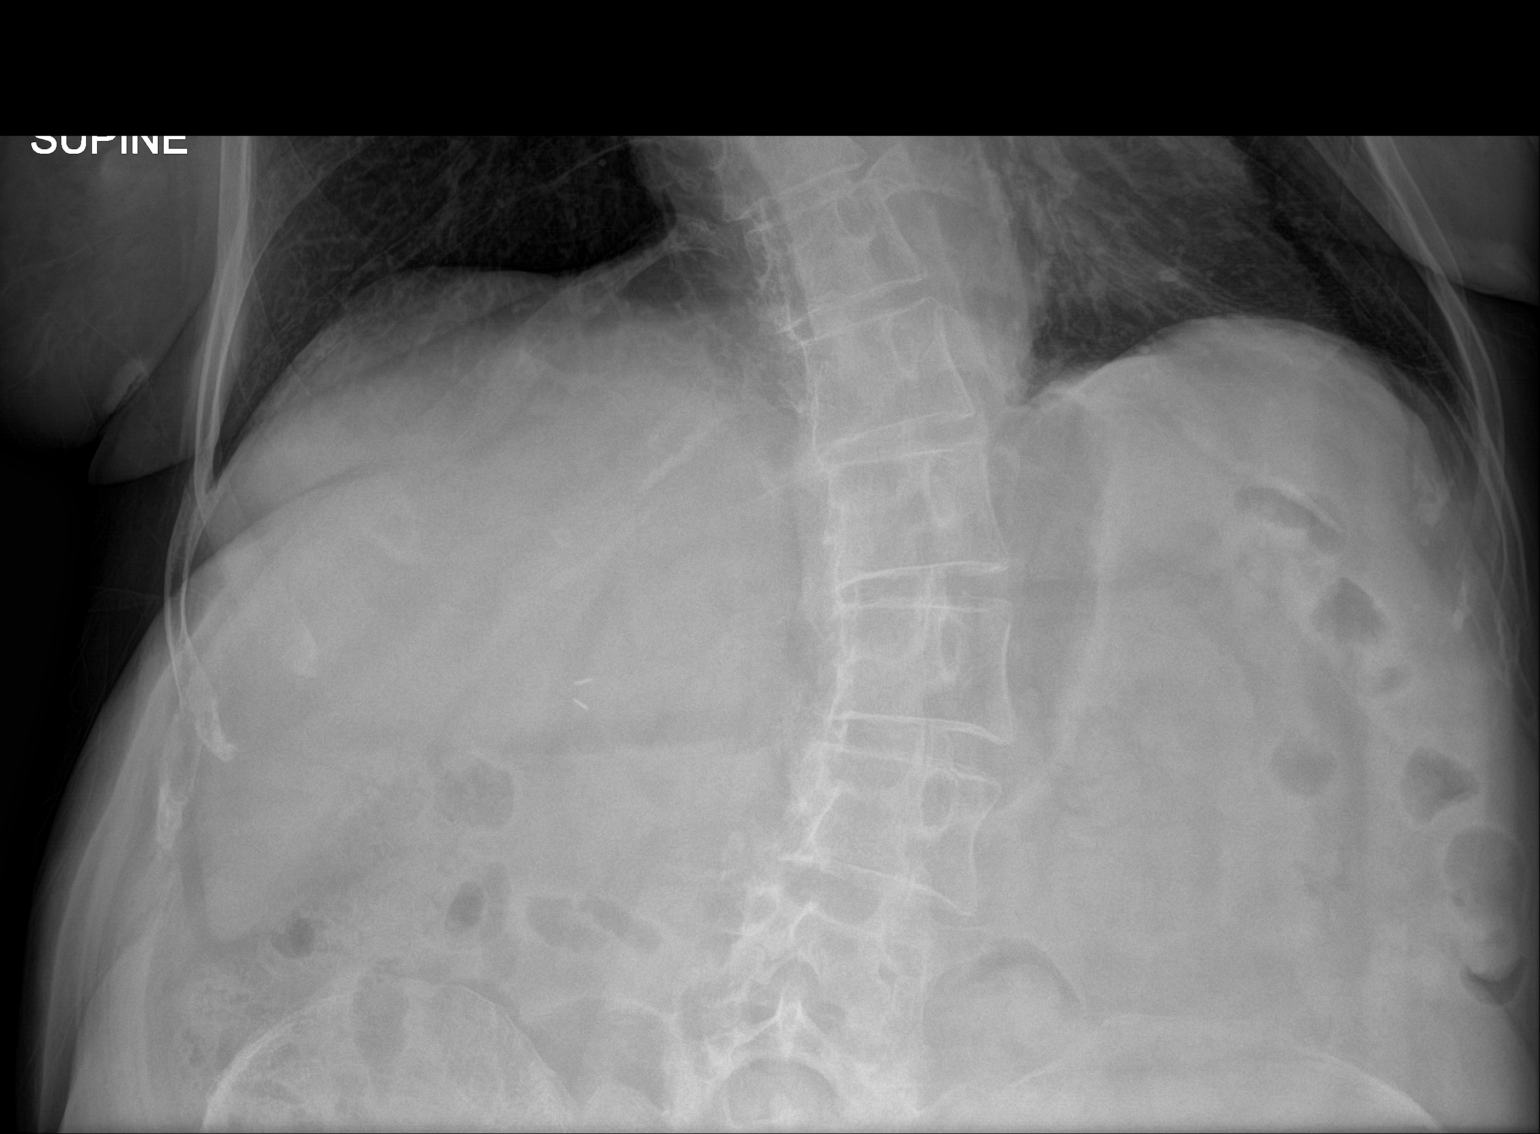

[abdomen supine (2 of 2)]
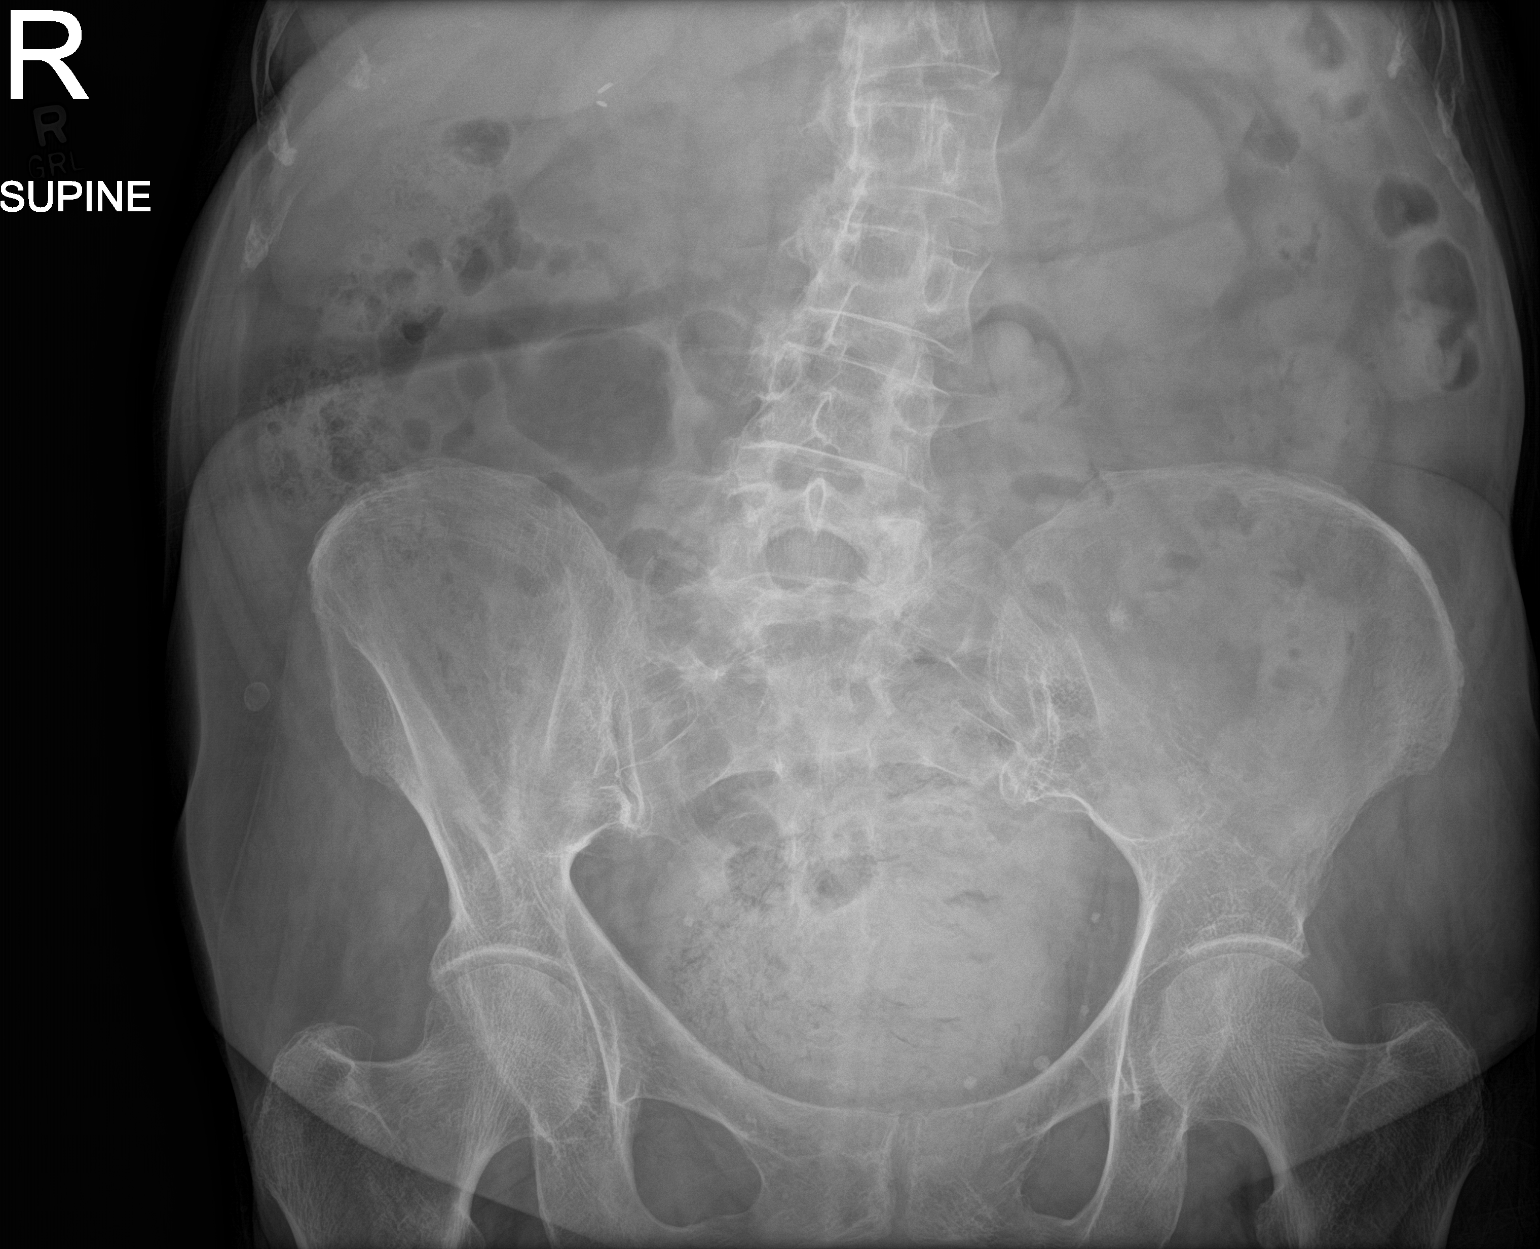

[3 of 3 positions shown; findings below may reference images not displayed]

FINDINGS: No free intraperitoneal air. There is S shaped thoracolumbar
scoliosis. No dilated small bowel. There is a large stool ball in
the rectum. Moderate stool volume throughout the rest of the colon.
IMPRESSION: Large stool ball in the colon, likely fecal impaction.

## 2019-11-10 IMAGING — CR DG ABDOMEN 1V
1 series · 2 of 2 positions shown · non-contrast
Comparison: May 19, 2017

CLINICAL DATA: Chronic constipation

EXAM:
ABDOMEN - 1 VIEW

[Series 1: dg abd 1 view · 0.14mm/px · 2 of 2 slices shown]
[im 1/2]
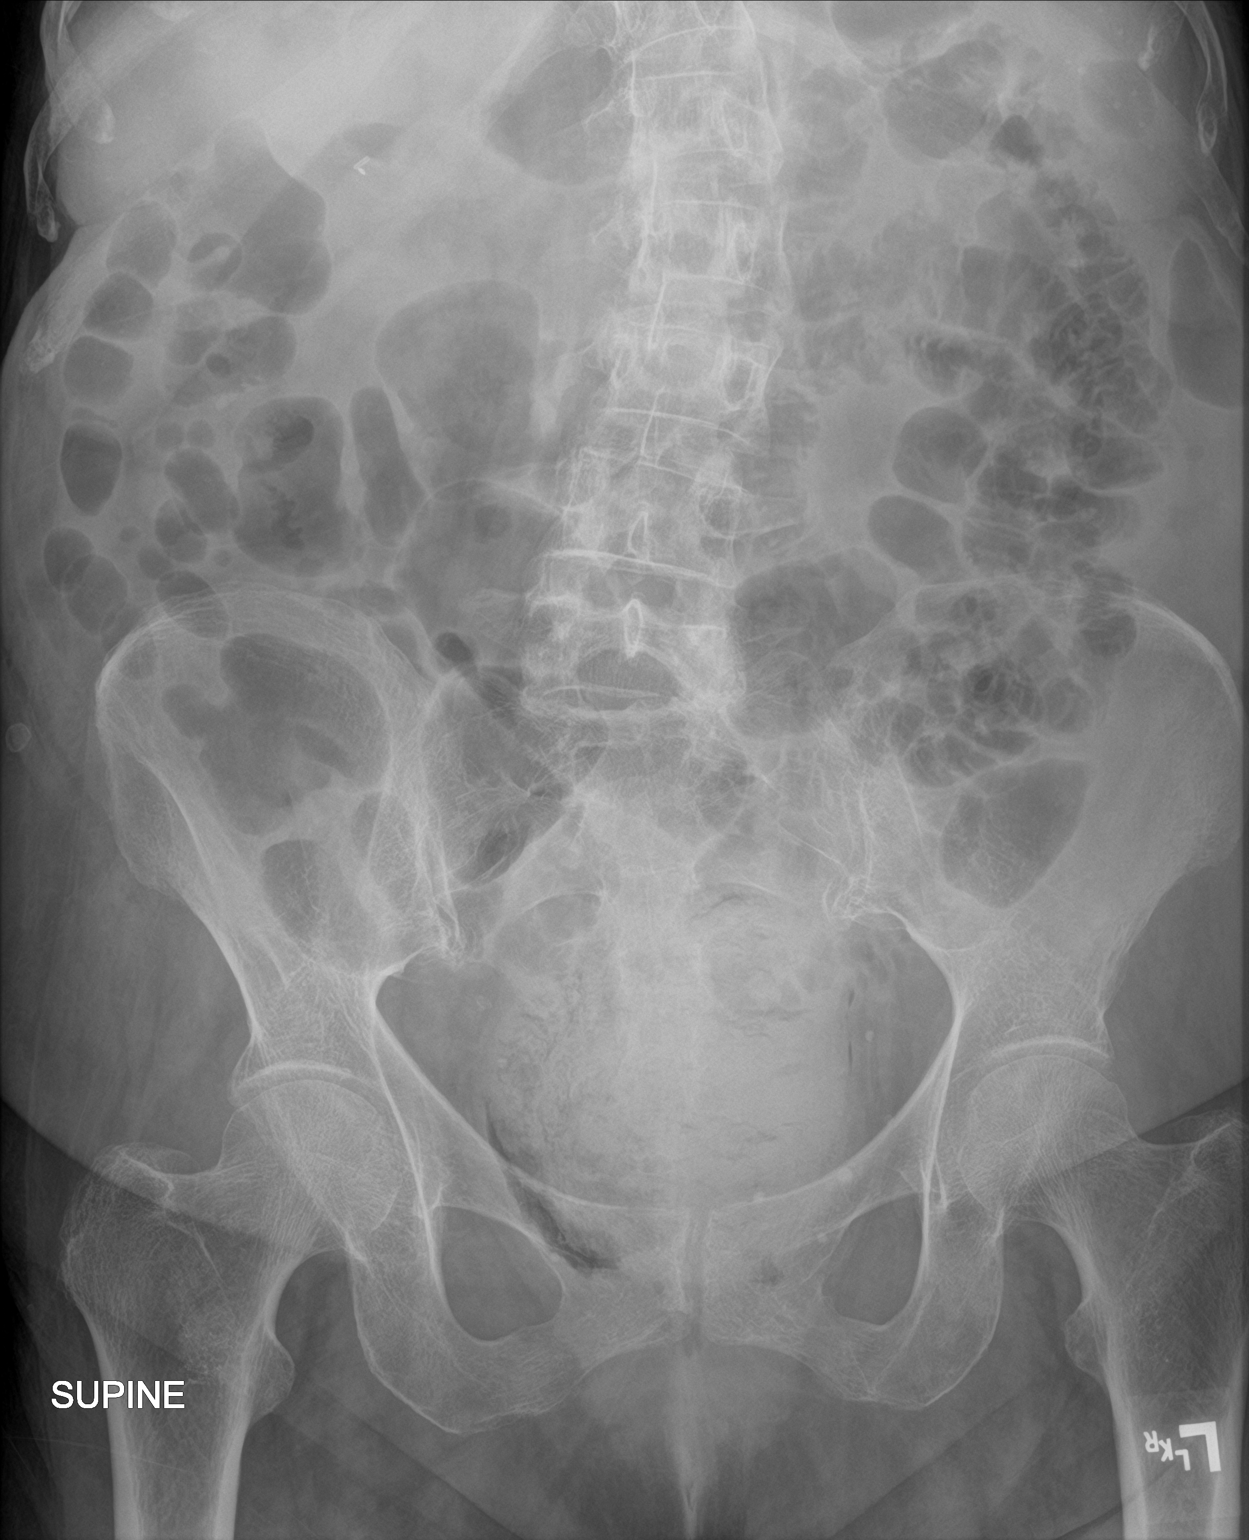
[im 2/2]
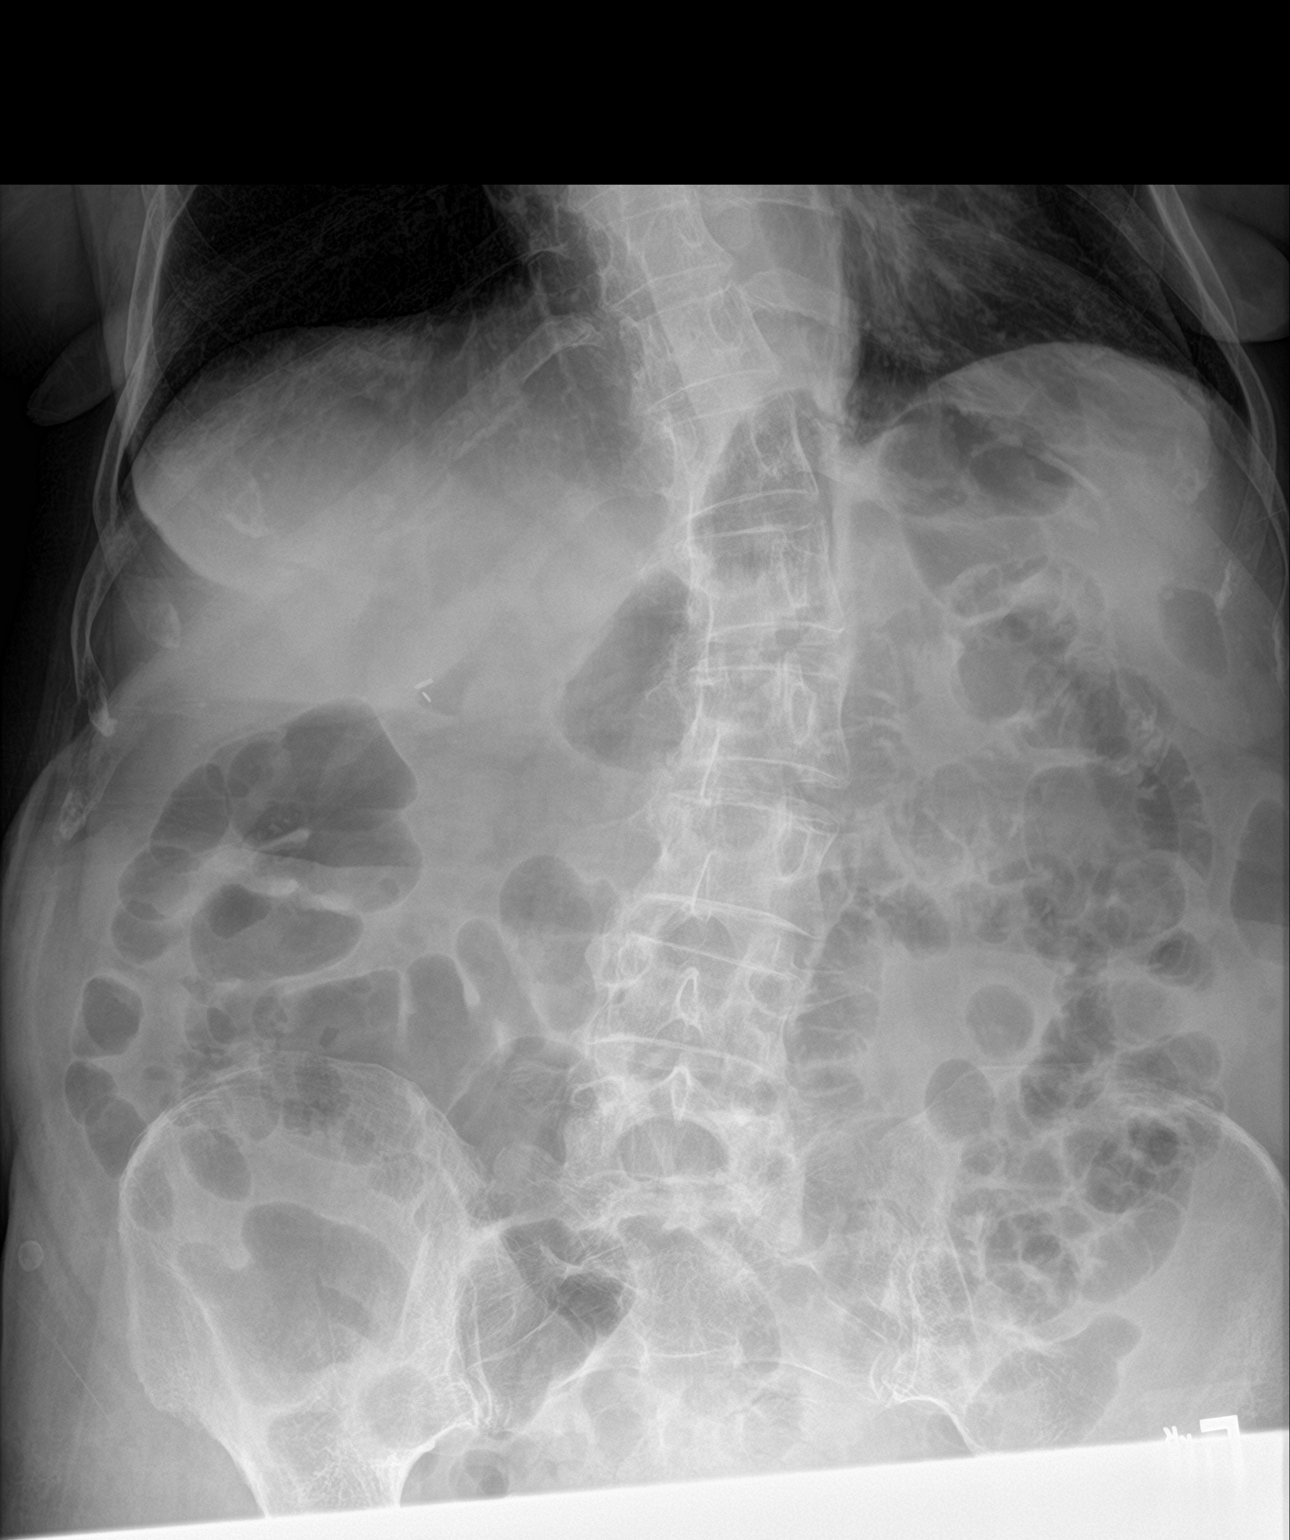

[2 of 2 positions shown; findings below may reference images not displayed]

FINDINGS: Rectum remains distended with stool. There is fairly mild stool seen
elsewhere in the colon. There is no bowel dilatation or air-fluid
level to suggest bowel obstruction. No free air. Lung bases are
clear. There is thoracolumbar levoscoliosis. There are surgical
clips in the right upper quadrant. There are phleboliths in the
pelvis.
IMPRESSION: Rectum remains distended with stool. Question a degree of fecal
impaction distally. No bowel obstruction otherwise. No free air.
Lung bases clear.

## 2021-03-27 IMAGING — MR MRI HEAD WITHOUT CONTRAST
8 of 10 series · 35 of 48 positions shown · non-contrast
Comparison: Head CT 7 days ago

CLINICAL DATA: Generalized muscle weakness

EXAM:
MRI HEAD WITHOUT CONTRAST
TECHNIQUE: Multiplanar, multiecho pulse sequences of the brain and surrounding
structures were obtained without intravenous contrast.

[Series 2: T1 · sagittal · 5.0mm · 0.41mm/px · 2 of 20 slices shown (1 of 2)]
[im 1/20]
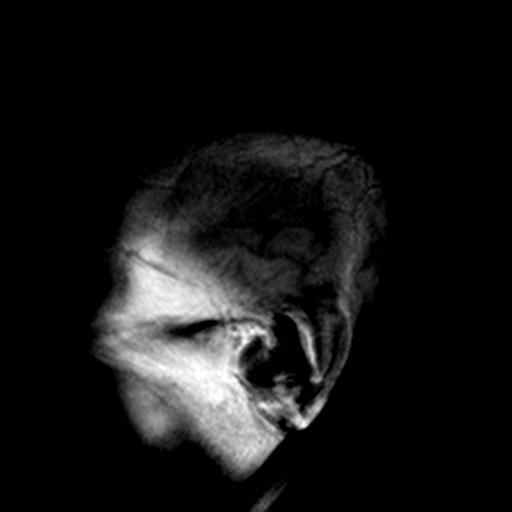
[im 20/20]
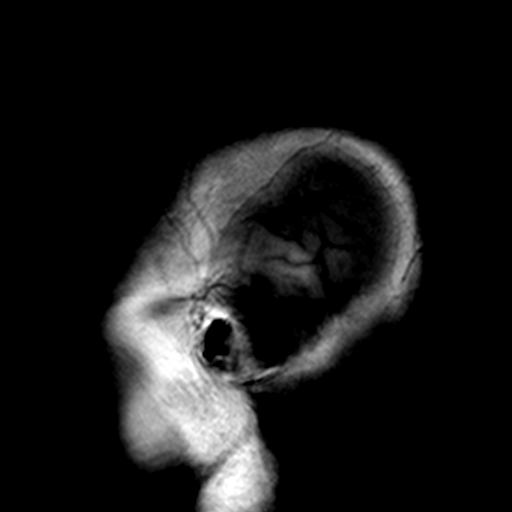

[Series 3: DWI · axial · 3.0mm · 0.75mm/px · z∈[+14,+172]mm · 7 of 54 slices shown (1 of 2)]
[im 1/54]
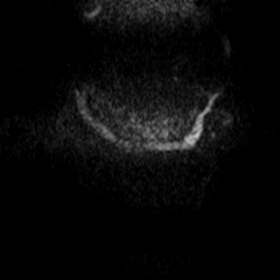
[im 9/54]
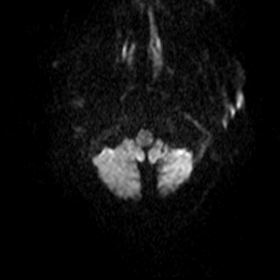
[im 18/54]
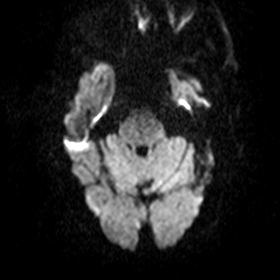
[im 27/54]
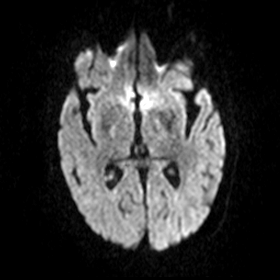
[im 36/54]
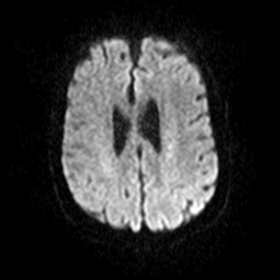
[im 45/54]
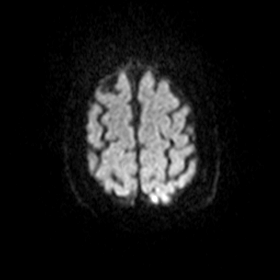
[im 54/54]
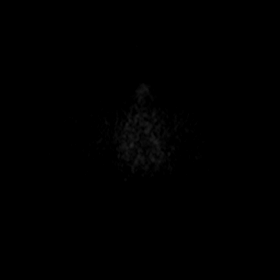

[Series 5: DWI · coronal · 5.0mm · 0.51mm/px · 4 of 34 slices shown (2 of 2)]
[im 1/34]
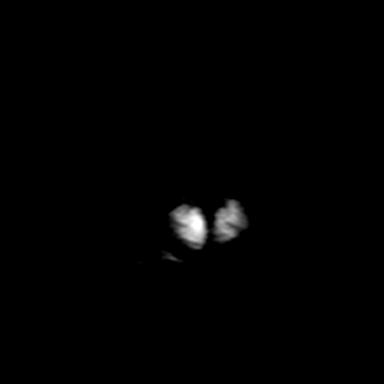
[im 12/34]
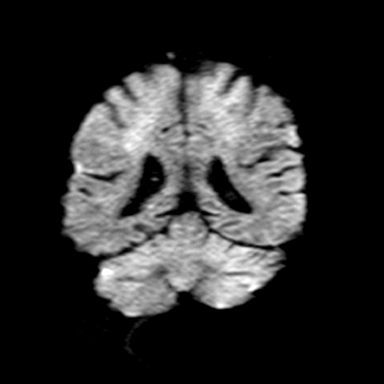
[im 23/34]
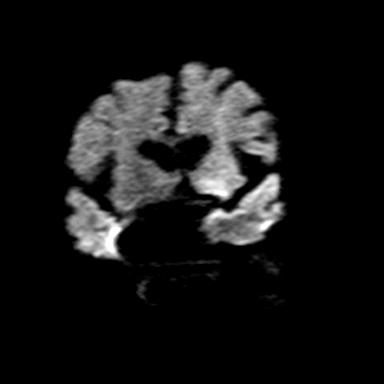
[im 34/34]
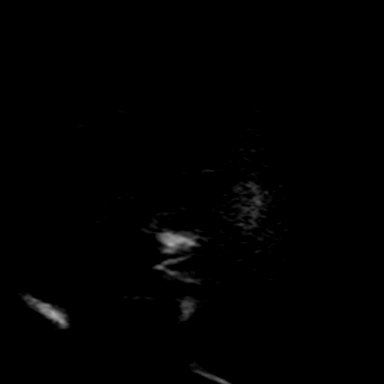

[Series 7: T2 · axial · 5.0mm · 0.68mm/px · z∈[+21,+164]mm · 3 of 23 slices shown (1 of 3)]
[im 1/23]
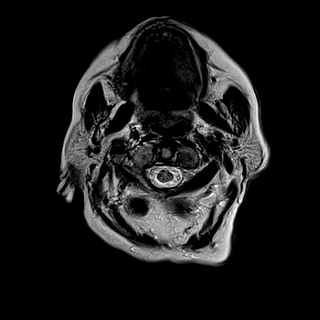
[im 12/23]
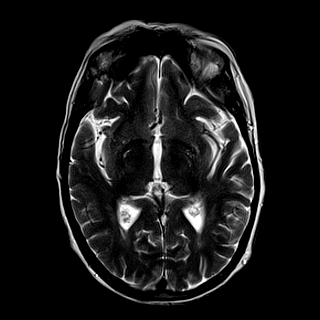
[im 23/23]
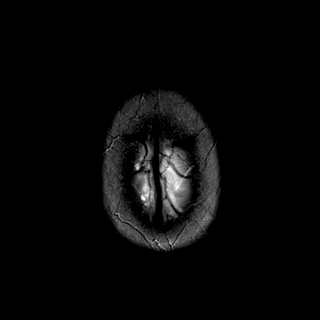

[Series 8: T2 · axial · 5.0mm · 0.41mm/px · z∈[+28,+157]mm · 3 of 21 slices shown (2 of 3)]
[im 1/21]
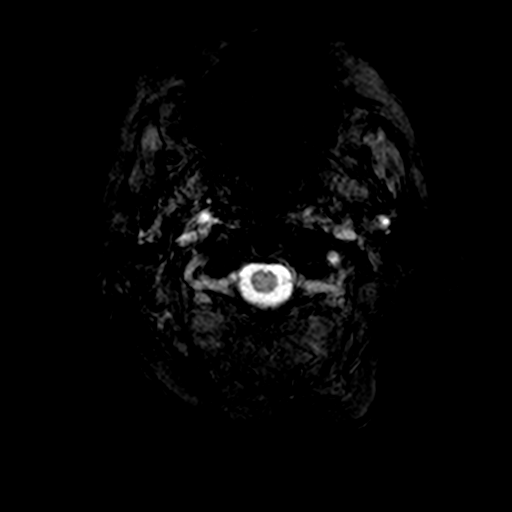
[im 11/21]
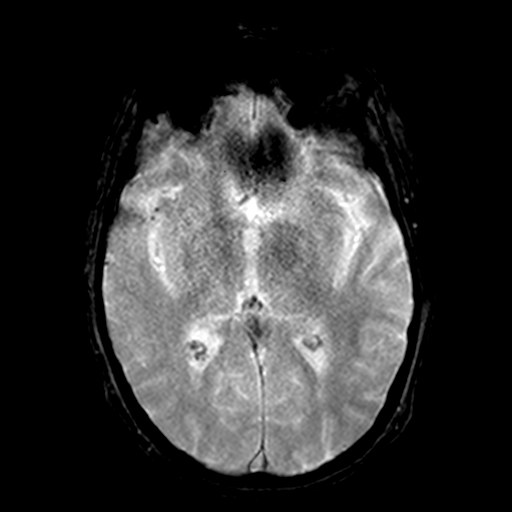
[im 21/21]
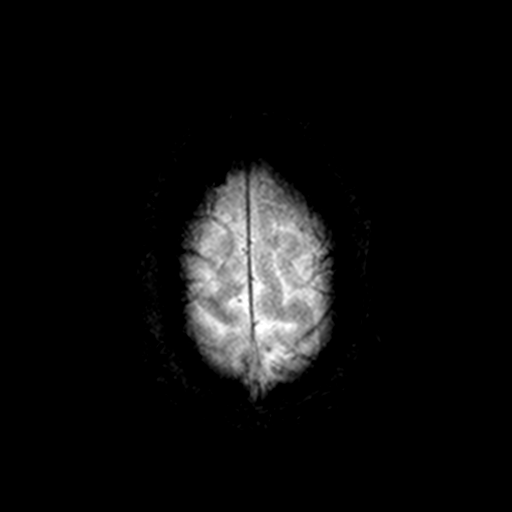

[Series 9: FLAIR · axial · 3.0mm · 0.85mm/px · z∈[+24,+161]mm · 6 of 47 slices shown]
[im 1/47]
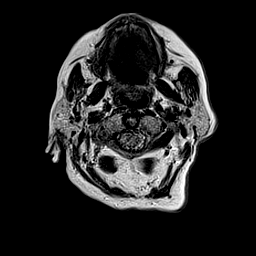
[im 10/47]
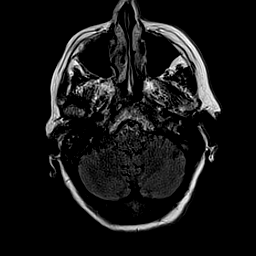
[im 19/47]
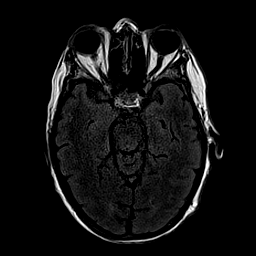
[im 28/47]
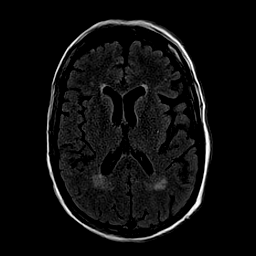
[im 37/47]
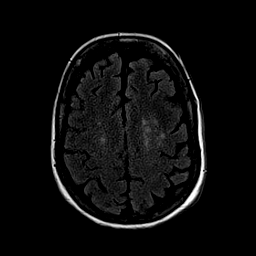
[im 47/47]
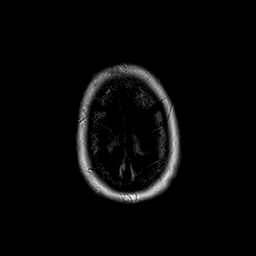

[Series 10: T1 · axial · 2.0mm · 0.42mm/px · z∈[+18,+160]mm · 7 of 81 slices shown (2 of 2)]
[im 1/81]
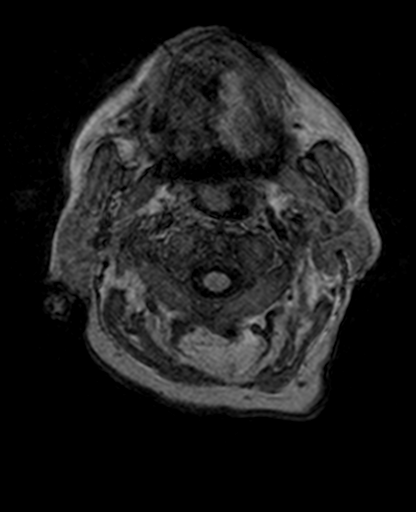
[im 9/81]
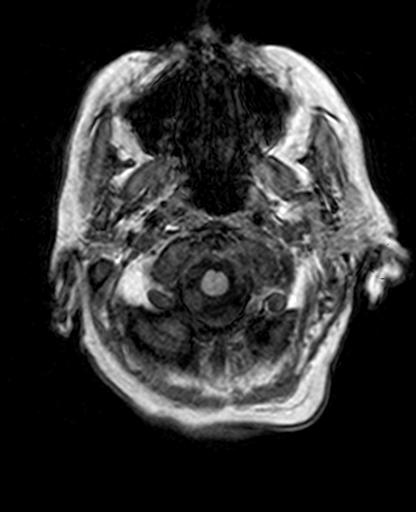
[im 27/81]
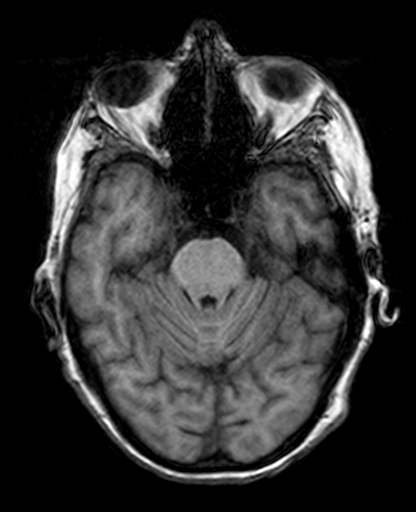
[im 36/81]
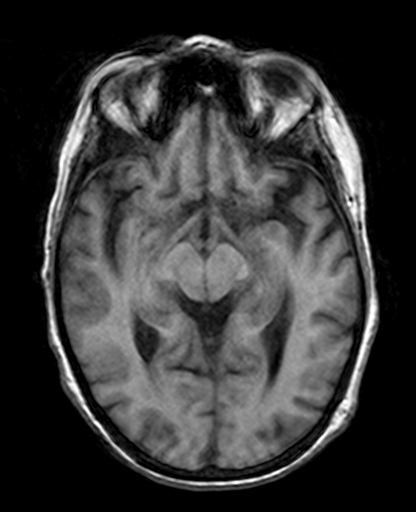
[im 45/81]
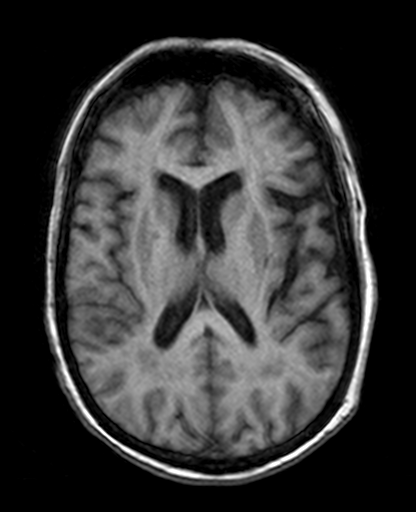
[im 54/81]
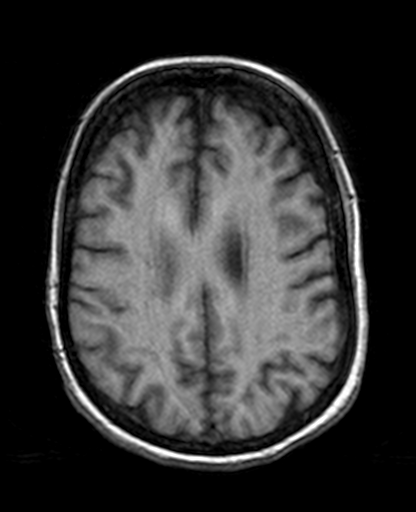
[im 72/81]
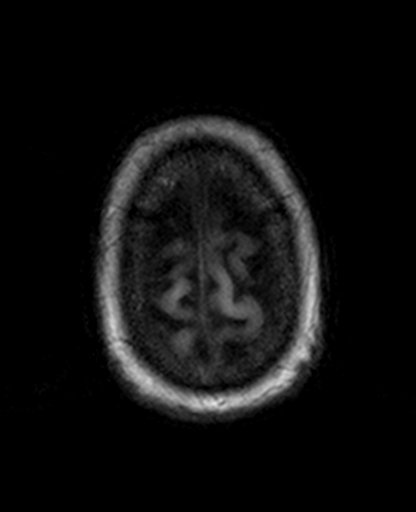

[Series 11: T2 · coronal · 5.0mm · 0.63mm/px · 3 of 28 slices shown (3 of 3)]
[im 1/28]
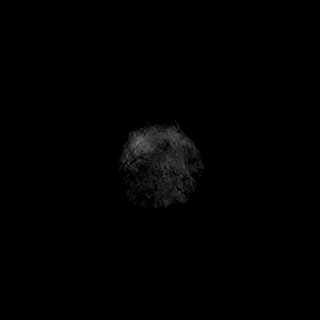
[im 14/28]
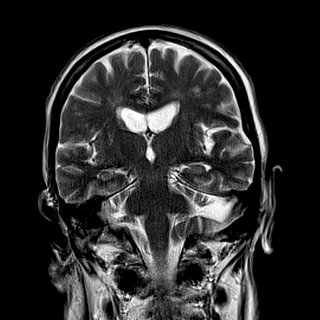
[im 28/28]
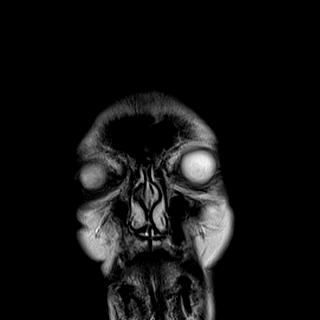

[35 of 48 positions shown; findings below may reference images not displayed]

FINDINGS: Brain: No acute infarction, hemorrhage, hydrocephalus, extra-axial
collection or mass lesion. Mild for age small-vessel ischemic type
change in the deep cerebral white matter. Age normal brain volume.

Vascular: Major flow voids are preserved

Skull and upper cervical spine: Negative for marrow lesion

Sinuses/Orbits: Negative
IMPRESSION: Unremarkable brain MRI for age.
